# Patient Record
Sex: Female | Born: 1950
Health system: Southern US, Community
[De-identification: ages and names within clinical notes are randomized; demographics above are authoritative.]

## PROBLEM LIST (undated history)

## (undated) DIAGNOSIS — Z923 Personal history of irradiation: Secondary | ICD-10-CM

## (undated) DIAGNOSIS — K589 Irritable bowel syndrome without diarrhea: Secondary | ICD-10-CM

## (undated) DIAGNOSIS — K5790 Diverticulosis of intestine, part unspecified, without perforation or abscess without bleeding: Secondary | ICD-10-CM

## (undated) DIAGNOSIS — N809 Endometriosis, unspecified: Secondary | ICD-10-CM

## (undated) DIAGNOSIS — J449 Chronic obstructive pulmonary disease, unspecified: Secondary | ICD-10-CM

## (undated) DIAGNOSIS — K449 Diaphragmatic hernia without obstruction or gangrene: Secondary | ICD-10-CM

## (undated) DIAGNOSIS — R112 Nausea with vomiting, unspecified: Secondary | ICD-10-CM

## (undated) DIAGNOSIS — R519 Headache, unspecified: Secondary | ICD-10-CM

## (undated) DIAGNOSIS — K219 Gastro-esophageal reflux disease without esophagitis: Secondary | ICD-10-CM

## (undated) DIAGNOSIS — C801 Malignant (primary) neoplasm, unspecified: Secondary | ICD-10-CM

## (undated) DIAGNOSIS — T7840XA Allergy, unspecified, initial encounter: Secondary | ICD-10-CM

## (undated) DIAGNOSIS — K4090 Unilateral inguinal hernia, without obstruction or gangrene, not specified as recurrent: Secondary | ICD-10-CM

## (undated) DIAGNOSIS — M1990 Unspecified osteoarthritis, unspecified site: Secondary | ICD-10-CM

## (undated) DIAGNOSIS — H269 Unspecified cataract: Secondary | ICD-10-CM

## (undated) DIAGNOSIS — F419 Anxiety disorder, unspecified: Secondary | ICD-10-CM

## (undated) DIAGNOSIS — Z9889 Other specified postprocedural states: Secondary | ICD-10-CM

## (undated) DIAGNOSIS — F32A Depression, unspecified: Secondary | ICD-10-CM

## (undated) DIAGNOSIS — H409 Unspecified glaucoma: Secondary | ICD-10-CM

## (undated) DIAGNOSIS — D126 Benign neoplasm of colon, unspecified: Secondary | ICD-10-CM

## (undated) DIAGNOSIS — F329 Major depressive disorder, single episode, unspecified: Secondary | ICD-10-CM

## (undated) DIAGNOSIS — J939 Pneumothorax, unspecified: Secondary | ICD-10-CM

## (undated) HISTORY — PX: OTHER SURGICAL HISTORY: SHX169

## (undated) HISTORY — DX: Diaphragmatic hernia without obstruction or gangrene: K44.9

## (undated) HISTORY — DX: Irritable bowel syndrome, unspecified: K58.9

## (undated) HISTORY — DX: Endometriosis, unspecified: N80.9

## (undated) HISTORY — DX: Anxiety disorder, unspecified: F41.9

## (undated) HISTORY — DX: Unspecified glaucoma: H40.9

## (undated) HISTORY — DX: Allergy, unspecified, initial encounter: T78.40XA

## (undated) HISTORY — PX: BACK SURGERY: SHX140

## (undated) HISTORY — PX: PLEURAL SCARIFICATION: SHX748

## (undated) HISTORY — PX: TUBAL LIGATION: SHX77

## (undated) HISTORY — DX: Chronic obstructive pulmonary disease, unspecified: J44.9

## (undated) HISTORY — DX: Malignant (primary) neoplasm, unspecified: C80.1

## (undated) HISTORY — DX: Major depressive disorder, single episode, unspecified: F32.9

## (undated) HISTORY — DX: Diverticulosis of intestine, part unspecified, without perforation or abscess without bleeding: K57.90

## (undated) HISTORY — DX: Unilateral inguinal hernia, without obstruction or gangrene, not specified as recurrent: K40.90

## (undated) HISTORY — DX: Gastro-esophageal reflux disease without esophagitis: K21.9

## (undated) HISTORY — DX: Pneumothorax, unspecified: J93.9

## (undated) HISTORY — DX: Unspecified cataract: H26.9

## (undated) HISTORY — DX: Depression, unspecified: F32.A

## (undated) HISTORY — PX: CATARACT EXTRACTION: SUR2

## (undated) HISTORY — DX: Benign neoplasm of colon, unspecified: D12.6

---

## 1985-12-30 DIAGNOSIS — J939 Pneumothorax, unspecified: Secondary | ICD-10-CM

## 1985-12-30 HISTORY — DX: Pneumothorax, unspecified: J93.9

## 1990-12-30 HISTORY — PX: APPENDECTOMY: SHX54

## 1990-12-30 HISTORY — PX: ABDOMINAL HYSTERECTOMY: SHX81

## 1994-06-27 ENCOUNTER — Encounter: Payer: Self-pay | Admitting: Internal Medicine

## 1999-06-13 ENCOUNTER — Other Ambulatory Visit: Admission: RE | Admit: 1999-06-13 | Discharge: 1999-06-13 | Payer: Self-pay | Admitting: Obstetrics and Gynecology

## 2000-07-07 ENCOUNTER — Encounter: Payer: Self-pay | Admitting: Obstetrics and Gynecology

## 2000-07-07 ENCOUNTER — Encounter: Admission: RE | Admit: 2000-07-07 | Discharge: 2000-07-07 | Payer: Self-pay | Admitting: Obstetrics and Gynecology

## 2000-07-21 ENCOUNTER — Encounter: Admission: RE | Admit: 2000-07-21 | Discharge: 2000-07-21 | Payer: Self-pay | Admitting: Obstetrics and Gynecology

## 2000-07-21 ENCOUNTER — Encounter: Payer: Self-pay | Admitting: Obstetrics and Gynecology

## 2000-07-28 ENCOUNTER — Other Ambulatory Visit: Admission: RE | Admit: 2000-07-28 | Discharge: 2000-07-28 | Payer: Self-pay | Admitting: *Deleted

## 2000-11-18 ENCOUNTER — Encounter: Payer: Self-pay | Admitting: Neurosurgery

## 2000-11-19 ENCOUNTER — Encounter: Payer: Self-pay | Admitting: Neurosurgery

## 2000-11-19 ENCOUNTER — Inpatient Hospital Stay (HOSPITAL_COMMUNITY): Admission: RE | Admit: 2000-11-19 | Discharge: 2000-11-19 | Payer: Self-pay | Admitting: Neurosurgery

## 2000-12-05 ENCOUNTER — Encounter: Admission: RE | Admit: 2000-12-05 | Discharge: 2000-12-05 | Payer: Self-pay | Admitting: Neurosurgery

## 2000-12-05 ENCOUNTER — Encounter: Payer: Self-pay | Admitting: Neurosurgery

## 2000-12-28 ENCOUNTER — Encounter: Payer: Self-pay | Admitting: Neurosurgery

## 2000-12-28 ENCOUNTER — Ambulatory Visit (HOSPITAL_COMMUNITY): Admission: RE | Admit: 2000-12-28 | Discharge: 2000-12-28 | Payer: Self-pay | Admitting: Neurosurgery

## 2001-03-26 ENCOUNTER — Encounter: Admission: RE | Admit: 2001-03-26 | Discharge: 2001-03-26 | Payer: Self-pay | Admitting: Neurosurgery

## 2001-03-26 ENCOUNTER — Encounter: Payer: Self-pay | Admitting: Neurosurgery

## 2001-03-29 ENCOUNTER — Ambulatory Visit (HOSPITAL_COMMUNITY): Admission: RE | Admit: 2001-03-29 | Discharge: 2001-03-29 | Payer: Self-pay | Admitting: Neurosurgery

## 2001-03-29 ENCOUNTER — Encounter: Payer: Self-pay | Admitting: Neurosurgery

## 2001-09-16 ENCOUNTER — Other Ambulatory Visit: Admission: RE | Admit: 2001-09-16 | Discharge: 2001-09-16 | Payer: Self-pay | Admitting: Obstetrics and Gynecology

## 2001-10-12 ENCOUNTER — Encounter: Payer: Self-pay | Admitting: Obstetrics and Gynecology

## 2001-10-12 ENCOUNTER — Encounter: Admission: RE | Admit: 2001-10-12 | Discharge: 2001-10-12 | Payer: Self-pay | Admitting: Obstetrics and Gynecology

## 2001-11-25 ENCOUNTER — Encounter: Payer: Self-pay | Admitting: *Deleted

## 2001-11-25 ENCOUNTER — Encounter: Admission: RE | Admit: 2001-11-25 | Discharge: 2001-11-25 | Payer: Self-pay | Admitting: *Deleted

## 2002-01-05 ENCOUNTER — Encounter: Payer: Self-pay | Admitting: *Deleted

## 2002-01-05 ENCOUNTER — Encounter: Admission: RE | Admit: 2002-01-05 | Discharge: 2002-01-05 | Payer: Self-pay | Admitting: *Deleted

## 2002-03-25 ENCOUNTER — Ambulatory Visit (HOSPITAL_COMMUNITY): Admission: RE | Admit: 2002-03-25 | Discharge: 2002-03-25 | Payer: Self-pay | Admitting: *Deleted

## 2002-03-25 ENCOUNTER — Encounter: Payer: Self-pay | Admitting: *Deleted

## 2002-04-08 ENCOUNTER — Encounter: Admission: RE | Admit: 2002-04-08 | Discharge: 2002-04-08 | Payer: Self-pay | Admitting: *Deleted

## 2002-04-08 ENCOUNTER — Encounter: Payer: Self-pay | Admitting: *Deleted

## 2002-04-13 ENCOUNTER — Encounter: Payer: Self-pay | Admitting: *Deleted

## 2002-04-13 ENCOUNTER — Encounter: Admission: RE | Admit: 2002-04-13 | Discharge: 2002-04-13 | Payer: Self-pay | Admitting: *Deleted

## 2002-04-15 ENCOUNTER — Encounter: Payer: Self-pay | Admitting: *Deleted

## 2002-04-15 ENCOUNTER — Ambulatory Visit (HOSPITAL_COMMUNITY): Admission: RE | Admit: 2002-04-15 | Discharge: 2002-04-15 | Payer: Self-pay | Admitting: *Deleted

## 2002-05-20 ENCOUNTER — Encounter: Payer: Self-pay | Admitting: Internal Medicine

## 2002-10-27 ENCOUNTER — Emergency Department (HOSPITAL_COMMUNITY): Admission: EM | Admit: 2002-10-27 | Discharge: 2002-10-27 | Payer: Self-pay

## 2002-11-04 ENCOUNTER — Encounter: Payer: Self-pay | Admitting: Obstetrics and Gynecology

## 2002-11-04 ENCOUNTER — Encounter: Admission: RE | Admit: 2002-11-04 | Discharge: 2002-11-04 | Payer: Self-pay | Admitting: Obstetrics and Gynecology

## 2003-01-07 ENCOUNTER — Ambulatory Visit (HOSPITAL_COMMUNITY): Admission: RE | Admit: 2003-01-07 | Discharge: 2003-01-07 | Payer: Self-pay | Admitting: Internal Medicine

## 2003-01-07 ENCOUNTER — Encounter: Payer: Self-pay | Admitting: Internal Medicine

## 2003-07-07 ENCOUNTER — Encounter: Payer: Self-pay | Admitting: Internal Medicine

## 2003-07-07 ENCOUNTER — Ambulatory Visit (HOSPITAL_COMMUNITY): Admission: RE | Admit: 2003-07-07 | Discharge: 2003-07-07 | Payer: Self-pay | Admitting: Internal Medicine

## 2004-01-27 ENCOUNTER — Ambulatory Visit (HOSPITAL_COMMUNITY): Admission: RE | Admit: 2004-01-27 | Discharge: 2004-01-27 | Payer: Self-pay | Admitting: Family Medicine

## 2004-02-16 ENCOUNTER — Encounter: Admission: RE | Admit: 2004-02-16 | Discharge: 2004-02-16 | Payer: Self-pay | Admitting: Family Medicine

## 2005-08-20 ENCOUNTER — Other Ambulatory Visit: Admission: RE | Admit: 2005-08-20 | Discharge: 2005-08-20 | Payer: Self-pay | Admitting: Family Medicine

## 2005-08-22 ENCOUNTER — Encounter: Admission: RE | Admit: 2005-08-22 | Discharge: 2005-08-22 | Payer: Self-pay | Admitting: Family Medicine

## 2006-07-22 ENCOUNTER — Ambulatory Visit: Payer: Self-pay | Admitting: Internal Medicine

## 2006-07-29 ENCOUNTER — Encounter: Payer: Self-pay | Admitting: Internal Medicine

## 2006-07-29 ENCOUNTER — Ambulatory Visit: Payer: Self-pay | Admitting: Cardiology

## 2008-05-09 ENCOUNTER — Encounter: Admission: RE | Admit: 2008-05-09 | Discharge: 2008-05-09 | Payer: Self-pay | Admitting: Family Medicine

## 2008-08-23 ENCOUNTER — Encounter: Admission: RE | Admit: 2008-08-23 | Discharge: 2008-08-23 | Payer: Self-pay | Admitting: Family Medicine

## 2008-09-21 ENCOUNTER — Encounter (INDEPENDENT_AMBULATORY_CARE_PROVIDER_SITE_OTHER): Payer: Self-pay | Admitting: *Deleted

## 2008-09-21 ENCOUNTER — Encounter: Admission: RE | Admit: 2008-09-21 | Discharge: 2008-09-21 | Payer: Self-pay | Admitting: Family Medicine

## 2008-10-17 DIAGNOSIS — K449 Diaphragmatic hernia without obstruction or gangrene: Secondary | ICD-10-CM | POA: Insufficient documentation

## 2008-10-17 DIAGNOSIS — J939 Pneumothorax, unspecified: Secondary | ICD-10-CM | POA: Insufficient documentation

## 2008-10-17 DIAGNOSIS — R1031 Right lower quadrant pain: Secondary | ICD-10-CM | POA: Insufficient documentation

## 2008-10-17 DIAGNOSIS — R1032 Left lower quadrant pain: Secondary | ICD-10-CM

## 2008-10-17 DIAGNOSIS — Z860100 Personal history of colon polyps, unspecified: Secondary | ICD-10-CM | POA: Insufficient documentation

## 2008-10-17 DIAGNOSIS — K5730 Diverticulosis of large intestine without perforation or abscess without bleeding: Secondary | ICD-10-CM | POA: Insufficient documentation

## 2008-10-17 DIAGNOSIS — J930 Spontaneous tension pneumothorax: Secondary | ICD-10-CM

## 2008-10-17 DIAGNOSIS — N809 Endometriosis, unspecified: Secondary | ICD-10-CM | POA: Insufficient documentation

## 2008-10-17 DIAGNOSIS — Z8659 Personal history of other mental and behavioral disorders: Secondary | ICD-10-CM

## 2008-10-20 ENCOUNTER — Ambulatory Visit: Payer: Self-pay | Admitting: Internal Medicine

## 2009-03-29 ENCOUNTER — Encounter (INDEPENDENT_AMBULATORY_CARE_PROVIDER_SITE_OTHER): Payer: Self-pay | Admitting: *Deleted

## 2009-05-04 ENCOUNTER — Ambulatory Visit: Payer: Self-pay | Admitting: Internal Medicine

## 2009-05-10 ENCOUNTER — Encounter: Admission: RE | Admit: 2009-05-10 | Discharge: 2009-05-10 | Payer: Self-pay | Admitting: Family Medicine

## 2009-05-23 ENCOUNTER — Ambulatory Visit: Payer: Self-pay | Admitting: Internal Medicine

## 2010-05-14 ENCOUNTER — Encounter: Admission: RE | Admit: 2010-05-14 | Discharge: 2010-05-14 | Payer: Self-pay | Admitting: Family Medicine

## 2011-05-17 NOTE — Op Note (Signed)
[place]. [facility]  Patient:    [name] [name], [name] [name]                         MRN: [id number] Proc. Date: 11/19/00 Adm. Date:  [id number] Attending:  Emeterio Reeve                           Operative Report  POSTOPERATIVE DIAGNOSIS:  Herniated disk C6-7 on the right.  OPERATION PERFORMED:  Anterior cervical diskectomy and fusion at C6-C7 with a tether plate.  SURGEON:  Payton Doughty, M.D.  ANESTHESIA:  General endotracheal.  PREP:  Sterile Betadine prep and scrub with alcohol wipe.  COMPLICATIONS:  None.  INDICATIONS FOR PROCEDURE:  DESCRIPTION OF PROCEDURE:  The patient is a 60 year old right-handed white female with right C7 radiculopathy.  The patient was taken to the operating room, smoothly anesthetized and intubated, and placed supine on the operating table in halter head traction.  Following shave, prep and drape in the usual sterile fashion, the skin was incised in the midline medial border of sternocleidomastoid muscle on the left side at a point two fingerbreadths below the level of the carotid tubercle.  The platysma was identified, elevated, divided and undermined.  The sternocleidomastoid was identified. Medial dissection revealed the carotid arteryDD:  11/19/00 TD:  11/19/00 Job: [id number]

## 2011-05-17 NOTE — Op Note (Signed)
[place]. [facility]  Patient:    [name] [name], [name] [name]                         MRN: [id number] Proc. Date: 11/19/00 Adm. Date:  [id number] Attending:  Emeterio Reeve                           Operative Report  PREOPERATIVE DIAGNOSIS:  Herniated disk C6-7 on the right.  POSTOPERATIVE DIAGNOSIS:  Herniated disk C6-7 on the right.  PROCEDURE:  Anterior cervical diskectomy and fusion at C6-7 with a Tether plate.  SURGEON:  Payton Doughty, M.D.  ANESTHESIA:  General endotracheal.  PREP:  _____.  COMPLICATIONS:  None.  DESCRIPTION OF PROCEDURE:  This is a 60 year old right-handed white girl with right C7 radiculopathy.  She was taken to the operating room, smooth induction of anesthesia, was intubated, placed supine on the operating table in the halter head traction.  Following shave, prep, and drape in the usual sterile fashion, skin was incised in the midline in the medial border of the sternocleidomastoid muscle on the left side one to two finger breadths below the level of the carotid tubercle.  The platysma was identified, elevated, divided, and undermined.  The sternocleidomastoid was identified, and medial dissection revealed the carotid artery and jugular vein, which were retracted laterally to the left, trachea and esophagus were retracted laterally to the right, exposing the bone at the anterior cervical spine.  A marker was placed, intraoperative x-ray obtained.  The marker was at 5-6.  Diskectomy was carried out at the level below that.  The longus colli was taken down over a short span of the C6 and C7 vertebral bodies and the disk then removed, first under gross observation, then using the operating microscope.  A large herniated disk with a free fragment out over the right C7 nerve root was identified and removed without difficulty.  It had penetrated the posterior longitudinal ligament and the annulus.  The nerve root was carefully explored and was  found to be free once the disk had been removed.  The left-sided root was explored and found to be unencumbered.  A 7 mm bone graft was then fashioned from patellar allograft and tapped into place.  A 16 mm Tether plate was then placed with 12 mm screws.  Intraoperative x-ray showed good placement of bone graft, plate, and screws, and there was good purchase obtained with all the screws, and they locked to the plate well.  The wound was irrigated and hemostasis assured.  The platysma was reapproximated with 3-0 Vicryl in interrupted fashion, subcutaneous tissue was reapproximated with 3-0 Vicryl in interrupted fashion.  Skin was closed with 4-0 Vicryl in a running subcuticular fashion.  Benzoin and Steri-Strips were placed, made occlusive with Telfa and OpSite.  The patient then returned to the recovery room in good condition after being placed in an Aspen collar. DD:  11/19/00 TD:  11/19/00 Job: [id number]

## 2011-05-17 NOTE — H&P (Signed)
[place]. [facility]  Patient:    [name] [name], [name] [name]                         MRN: [id number] Adm. Date:  [id number] Attending:  Emeterio Reeve                         History and Physical  ADMITTING DIAGNOSIS:  Herniated disk C6-7 with a right C7 radiculopathy.  HISTORY OF PRESENT ILLNESS:  This is a 60 year old right-handed white lady who for the past few weeks has had marked increase of pain in the right shoulder and down the right arm. She is having difficulty using an ink pen. Her left arm is unaffected. She does not have much neck pain.  MEDICAL HISTORY:  Remarkable for a hysterectomy in May 1990.  ALLERGIES:  SULFA.  MEDICATIONS:  Synthroid 0.025 mg q.d., Premarin 0.9 mg q.d., Vicodin on a p.r.n. basis, Celebrex 200 mg q.d. She also has migraines for which she uses etodalac, Reglan, or ergotamine on a p.r.n. basis.  SOCIAL HISTORY:  She smokes a pack and a half of cigarettes a day. She does not drink alcohol. She works in a [employer] from 1 to 50 pounds.  FAMILY HISTORY:  A history of Crohns disease in the family and a history of osteoporosis and hypertension. Both parents are deceased.  REVIEW OF SYSTEMS:  Remarkable for wearing glasses, sinus problems, headaches, arm weakness, back pain, arm pain and neck pain. She has thyroid disease and hormone problems.  PHYSICAL EXAMINATION:  HEENT:  Within normal limits.  NECK:  Good range of motion and does not seem to reproduce her arm pain, except for in extension.  CHEST:  Clear.  CARDIAC:  Regular rate and rhythm.  ABDOMEN:  Nontender. No hepatosplenomegaly.  EXTREMITIES:  Without clubbing or cyanosis. Peripheral pulses are good.  GENITOURINARY:  Deferred.  NEUROLOGIC:  She is awake, alert and oriented. Cranial nerves are intact. Motor exam reveals 5/5 strength throughout the upper extremities save for the right triceps, which was 3+ at best. No current sensory deficit, although  she does lack dexterity in her fingers. Reflexes are absent in the right triceps and 2 throughout the rest of the upper extremities. Lower extremities are nonmyopathic.  LABORATORY DATA:  She comes accompanied with plain films that demonstrates spondylitic changes on an MRI that shows large ruptured disk at C6-7 eccentric to the right side with compression of both the right 7 root, as well the cord on the right side.  CLINICAL IMPRESSION:  Profound right C7 radiculopathy secondary to disk.  PLAN:  Anterior cervicectomy and fusion. Given the profound nature of the deficit I do not think waiting or conservative measures are warranted here. The risks and benefits of this approach have been discussed with her and she wishes to proceed. DD:  11/19/00 TD:  11/19/00 Job: [id number]

## 2011-05-17 NOTE — Assessment & Plan Note (Signed)
[name] [name]                           GASTROENTEROLOGY OFFICE NOTE   NAME:[name], [name] SIDDIQUE                       MRN:          [id number]  DATE:07/22/2006                            DOB:          12-Apr-1951    [name] [name] is a 60 year old, white female with chronic intermittent left  lower quadrant abdominal pain.  The current attack started about 1 month ago  with severe left lower quadrant abdominal pain x2 weeks which got somewhat  better after she went on a full liquid diet and bland food.  She denied any  fever or rectal bleeding.  We have been treating her for left lower quadrant  abdominal pain for many years and it has been somewhat an enigma.  Her  colonoscopies in 1993, 1994, 1997 and in April 2003, showed only edematous  polyp of the colon in 1993, but did not show any significant diverticulosis.  The pain has been attributed to either endometriosis, which she had in the  past, some adhesions or possibly due to irritable bowel syndrome.  She has  been on antispasmodic medications intermittently.  This time, the pain is  already somewhat better.  It was feeling rather raw, but her bowel habits  have improved from having frequent stools to being almost normal.   MEDICATIONS:  1.  Robinul 42 mg.  She has not taken this at all.  2.  Multivitamin.  3.  Calcium supplements.   PHYSICAL EXAMINATION:  VITAL SIGNS:  Blood pressure 102/62, pulse 80, weight  193 pounds which is a 50 pound weight gain since her last visit.  LUNGS:  Clear to auscultation  CARDIAC:  Normal S1, S2.  ABDOMEN:  Soft, relaxed with minimal discomfort in left lower and left  middle quadrant around the umbilical area.  Normal right upper and lower  quadrants.  No palpable mass or rebound.  Liver edge at costal margin.  Bowel sounds normoactive.  RECTAL:  Normal tone, no stool.  Mucus was hemoccult negative.   IMPRESSION:  A 60 year old female with recurrence of the left  lower quadrant  abdominal pain.  Previous four colonoscopies did not clearly define  diagnosis.  I assume we are dealing with irritable bowel syndrome  or  possibly due to some pelvic adhesions from previous endometriosis.  At this  time, the symptoms overall feeling is that she could have an ischemic  colitis or some nonspecific segmental colitis.  She did not take her  antispasmodic medications because she works outdoors and the antispasmodics  can cause anhidrosis, lack of sweating, which could cause vasomotor  problems.   PLAN:  1.  CT scan of the abdomen with attention to left lower quadrant.  If the      pain does not resolve, we may be interested in flexible sigmoidoscopy to      look at the left colon.  2.  Levbid 0.375 mg take it only at bedtime to avoid the daytime affects of      the anticholinergics.  3.  Cipro 500 mg p.o. b.i.d. x1 week.  4.  So far, if  everything gets better, she will have a relook colonoscopy in      May 2008, otherwise may do flexible sigmoidoscopy prior to that.                                   [name] [name]. [name] Chance, MD   DMB/MedQ  DD:  07/22/2006  DT:  07/22/2006  Job #:  [id number]   cc:   [name] Coventry, MD

## 2012-05-07 ENCOUNTER — Encounter (HOSPITAL_COMMUNITY): Payer: Self-pay

## 2012-05-11 ENCOUNTER — Encounter (HOSPITAL_COMMUNITY): Payer: Self-pay

## 2012-05-20 ENCOUNTER — Other Ambulatory Visit (HOSPITAL_COMMUNITY): Payer: Self-pay | Admitting: Family Medicine

## 2012-05-21 ENCOUNTER — Ambulatory Visit (HOSPITAL_COMMUNITY): Payer: PRIVATE HEALTH INSURANCE | Attending: Cardiology

## 2012-05-21 ENCOUNTER — Encounter (HOSPITAL_COMMUNITY): Payer: Self-pay | Admitting: Family Medicine

## 2012-05-21 ENCOUNTER — Encounter: Payer: Self-pay | Admitting: Family Medicine

## 2012-05-21 DIAGNOSIS — R5381 Other malaise: Secondary | ICD-10-CM | POA: Insufficient documentation

## 2012-05-21 DIAGNOSIS — Z8249 Family history of ischemic heart disease and other diseases of the circulatory system: Secondary | ICD-10-CM | POA: Insufficient documentation

## 2012-05-21 DIAGNOSIS — R5383 Other fatigue: Secondary | ICD-10-CM | POA: Insufficient documentation

## 2012-05-21 DIAGNOSIS — E785 Hyperlipidemia, unspecified: Secondary | ICD-10-CM | POA: Insufficient documentation

## 2012-05-21 DIAGNOSIS — R002 Palpitations: Secondary | ICD-10-CM | POA: Insufficient documentation

## 2012-05-21 DIAGNOSIS — R072 Precordial pain: Secondary | ICD-10-CM | POA: Insufficient documentation

## 2012-05-21 DIAGNOSIS — R42 Dizziness and giddiness: Secondary | ICD-10-CM | POA: Insufficient documentation

## 2012-05-22 ENCOUNTER — Encounter (HOSPITAL_COMMUNITY): Payer: Self-pay | Admitting: Family Medicine

## 2012-11-01 ENCOUNTER — Encounter: Payer: Self-pay | Admitting: Family Medicine

## 2012-11-01 ENCOUNTER — Ambulatory Visit (INDEPENDENT_AMBULATORY_CARE_PROVIDER_SITE_OTHER): Payer: PRIVATE HEALTH INSURANCE | Admitting: Family Medicine

## 2012-11-01 ENCOUNTER — Other Ambulatory Visit: Payer: Self-pay | Admitting: Family Medicine

## 2012-11-01 VITALS — BP 116/84 | HR 70 | Temp 98.5°F | Resp 16 | Ht 68.0 in | Wt 199.6 lb

## 2012-11-01 DIAGNOSIS — J40 Bronchitis, not specified as acute or chronic: Secondary | ICD-10-CM

## 2012-11-01 MED ORDER — MOMETASONE FURO-FORMOTEROL FUM 200-5 MCG/ACT IN AERO: 2.0000 | INHALATION_SPRAY | Freq: Two times a day (BID) | RESPIRATORY_TRACT | Status: DC

## 2012-11-01 MED ORDER — MOXIFLOXACIN HCL 400 MG PO TABS: 400.0000 mg | ORAL_TABLET | Freq: Every day | ORAL | Status: DC

## 2012-11-01 MED ORDER — HYDROCODONE-HOMATROPINE 5-1.5 MG/5ML PO SYRP: 5.0000 mL | ORAL_SOLUTION | Freq: Three times a day (TID) | ORAL | Status: DC | PRN

## 2012-11-01 NOTE — Progress Notes (Signed)
@[facility] @   Patient ID: LENOR PROVENCHER MRN: [id number], DOB: 1951/09/17, 61 y.o. Date of Encounter: 11/01/2012, 12:11 PM  Primary Physician: Tomma Lightning, MD  Chief Complaint:  Chief Complaint  Patient presents with  . Cough    x 1 week green sputum     HPI: 61 y.o. year old female presents with a 5 day history of nasal congestion, post nasal drip, sore throat, and cough. Mild sinus pressure. Afebrile. No chills. Nasal congestion thick and green/yellow. Cough is productive of green/yellow sputum and not associated with time of day. Ears feel full, leading to sensation of muffled hearing. Has tried OTC cold preps without success. No GI complaints.   No sick contacts, recent antibiotics, or recent travels.   No leg trauma, sedentary periods, h/o cancer, or tobacco use.  Past Medical History  Diagnosis Date  . Depression   . Allergy   . Glaucoma   . Anxiety   . COPD (chronic obstructive pulmonary disease)      Home Meds: Prior to Admission medications   Medication Sig Start Date End Date Taking? Authorizing Provider  venlafaxine (EFFEXOR) 37.5 MG tablet Take 37.5 mg by mouth daily.   Yes Historical Provider, MD  HYDROcodone-homatropine (HYCODAN) 5-1.5 MG/5ML syrup Take 5 mLs by mouth every 8 (eight) hours as needed for cough. 11/01/12   Elvina Sidle, MD  mometasone-formoterol [facility]) 200-5 MCG/ACT AERO Inhale 2 puffs into the lungs 2 (two) times daily. 11/01/12   Elvina Sidle, MD  moxifloxacin (AVELOX) 400 MG tablet Take 1 tablet (400 mg total) by mouth daily. 11/01/12   Elvina Sidle, MD    Allergies:  Allergies  Allergen Reactions  . Cefaclor   . Latex Other (See Comments)    blister  . Ofloxacin     History   Social History  . Marital Status: Married    Spouse Name: N/A    Number of Children: N/A  . Years of Education: N/A   Occupational History  . Not on file.   Social History Main Topics  . Smoking status: Former Smoker    Start date: 01/31/2012    . Smokeless tobacco: Not on file  . Alcohol Use: No  . Drug Use: No  . Sexually Active: No   Other Topics Concern  . Not on file   Social History Narrative  . No narrative on file     Review of Systems: Constitutional: negative for chills, fever, night sweats or weight changes Cardiovascular: negative for chest pain or palpitations Respiratory: negative for hemoptysis, wheezing, or shortness of breath Abdominal: negative for abdominal pain, nausea, vomiting or diarrhea Dermatological: negative for rash Neurologic: negative for headache   Physical Exam: Blood pressure 116/84, pulse 70, temperature 98.5 F (36.9 C), temperature source Oral, resp. rate 16, height 5\' 8"  (1.727 m), weight 199 lb 9.6 oz (90.538 kg), SpO2 96.00%., Body mass index is 30.35 kg/(m^2). General: Well developed, well nourished, in no acute distress. Head: Normocephalic, atraumatic, eyes without discharge, sclera non-icteric, nares are congested. Bilateral auditory canals clear, TM's are without perforation, pearly grey with reflective cone of light bilaterally. No sinus TTP. Oral cavity moist, dentition normal. Posterior pharynx with post nasal drip and mild erythema. No peritonsillar abscess or tonsillar exudate. Neck: Supple. No thyromegaly. Full ROM. No lymphadenopathy. Lungs: Coarse breath sounds bilaterally without wheezes, rales, or rhonchi. Breathing is unlabored.  Heart: RRR with S1 S2. No murmurs, rubs, or gallops appreciated. Msk:  Strength and tone normal for age. Extremities: No clubbing  or cyanosis. No edema. Neuro: Alert and oriented X 3. Moves all extremities spontaneously. CNII-XII grossly in tact. Psych:  Responds to questions appropriately with a normal affect.   Labs:   ASSESSMENT AND PLAN:  61 y.o. year old female with bronchitis. 1. Bronchitis  moxifloxacin (AVELOX) 400 MG tablet, mometasone-formoterol (DULERA) 200-5 MCG/ACT AERO, HYDROcodone-homatropine (HYCODAN) 5-1.5 MG/5ML syrup     - -Mucinex -Tylenol/Motrin prn -Rest/fluids -RTC precautions -RTC 3-5 days if no improvement  Signed, Elvina Sidle, MD 11/01/2012 12:11 PM

## 2012-11-02 ENCOUNTER — Other Ambulatory Visit: Payer: Self-pay | Admitting: Family Medicine

## 2012-11-02 MED ORDER — PREDNISONE 20 MG PO TABS: 40.0000 mg | ORAL_TABLET | Freq: Every day | ORAL | Status: DC

## 2012-11-02 NOTE — Telephone Encounter (Signed)
Dr. Milus Glazier,  This patient was under the impression that you were going to prescribe prednisone.  Was that your intention?  If so, please approve the request.  If not, please advise and route message back to p [facility].  Thanks.

## 2013-05-03 ENCOUNTER — Telehealth: Payer: Self-pay | Admitting: Internal Medicine

## 2013-05-03 NOTE — Telephone Encounter (Signed)
Patient calling to report for the last week and half, she has had stomach problems. C/O 10 bowel movements/day that are paste like without blood. Abdominal tenderness in lower abdomen. Reports lots of gas and back pain. Denies nausea, vomiting, recent antibiotics or recent travel. Scheduled with Mike Gip, PA on 05/04/13 at 10:30 AM.

## 2013-05-04 ENCOUNTER — Ambulatory Visit (INDEPENDENT_AMBULATORY_CARE_PROVIDER_SITE_OTHER): Payer: BC Managed Care – PPO | Admitting: Physician Assistant

## 2013-05-04 ENCOUNTER — Ambulatory Visit (INDEPENDENT_AMBULATORY_CARE_PROVIDER_SITE_OTHER)
Admission: RE | Admit: 2013-05-04 | Discharge: 2013-05-04 | Disposition: A | Discharge status: Home / Self Care | Payer: BC Managed Care – PPO | Source: Ambulatory Visit | Attending: Physician Assistant | Admitting: Physician Assistant

## 2013-05-04 ENCOUNTER — Encounter: Payer: Self-pay | Admitting: Physician Assistant

## 2013-05-04 VITALS — BP 110/80 | HR 72 | Ht 68.9 in | Wt 208.1 lb

## 2013-05-04 DIAGNOSIS — R140 Abdominal distension (gaseous): Secondary | ICD-10-CM

## 2013-05-04 DIAGNOSIS — R194 Change in bowel habit: Secondary | ICD-10-CM

## 2013-05-04 DIAGNOSIS — K589 Irritable bowel syndrome without diarrhea: Secondary | ICD-10-CM

## 2013-05-04 DIAGNOSIS — R143 Flatulence: Secondary | ICD-10-CM

## 2013-05-04 DIAGNOSIS — R198 Other specified symptoms and signs involving the digestive system and abdomen: Secondary | ICD-10-CM

## 2013-05-04 MED ORDER — ALIGN PO CAPS: 1.0000 | ORAL_CAPSULE | Freq: Every day | ORAL | Status: DC

## 2013-05-04 MED ORDER — RIFAXIMIN 550 MG PO TABS: 550.0000 mg | ORAL_TABLET | Freq: Two times a day (BID) | ORAL | Status: DC

## 2013-05-04 MED ORDER — METRONIDAZOLE 250 MG PO TABS: ORAL_TABLET | ORAL | Status: DC

## 2013-05-04 NOTE — Progress Notes (Signed)
Subjective:    Patient ID: [name] [name], female    DOB: Oct 28, 1951, 62 y.o.   MRN: [id number]  HPI [name] [name] is a pleasant 62 year old female known to Dr. Vincent Gros with history of IBS and adenomatous colon polyps. She also has history of depression glaucoma anxiety and COPD. She is status post partial hysterectomy and appendectomy. She comes in today stating that she's been having problems for the past 10-12 days with abdominal bloating distention gas and change in her bowel habits with very pasty urgent stools. She says she tends to get these episodes once or twice a year usually in the spring and the fall and that this episode is worse than what she normally experiences. She says she's having about 10 bowel movements per day but that her stool is not liquid but pasty in consistency. There is no melena or hematochezia. She denies any fever or chills. She's had no nausea or vomiting and her appetite is fairly normal. She says she gets occasional episodes of explosive stool. He is not had any recent changes in her meds and last course of antibiotics was 6 months ago. Last colonoscopy was done in 2010 with 1 diminutive sigmoid polyp noted and otherwise normal exam. Tissue was not retrieved    Review of Systems  Constitutional: Negative.   HENT: Negative.   Eyes: Negative.   Respiratory: Negative.   Cardiovascular: Negative.   Gastrointestinal: Positive for diarrhea and abdominal distention.  Endocrine: Negative.   Genitourinary: Negative.   Skin: Negative.   Allergic/Immunologic: Negative.   Neurological: Negative.   Psychiatric/Behavioral: Negative.    Allergies  Allergen Reactions  . Cefaclor   . Latex Other (See Comments)    blister  . Ofloxacin    Outpatient Prescriptions Prior to Visit  Medication Sig Dispense Refill  . HYDROcodone-homatropine (HYCODAN) 5-1.5 MG/5ML syrup Take 5 mLs by mouth every 8 (eight) hours as needed for cough.  120 mL  0  . mometasone-formoterol (DULERA)  200-5 MCG/ACT AERO Inhale 2 puffs into the lungs 2 (two) times daily.  1 Inhaler  3  . moxifloxacin (AVELOX) 400 MG tablet Take 1 tablet (400 mg total) by mouth daily.  7 tablet  0  . predniSONE (DELTASONE) 20 MG tablet TAKE 1 TABLET BY MOUTH TWICE A DAY FOR 5 DAYS  10 tablet  0  . predniSONE (DELTASONE) 20 MG tablet Take 2 tablets (40 mg total) by mouth daily.  10 tablet  0  . venlafaxine (EFFEXOR) 37.5 MG tablet Take 37.5 mg by mouth daily.       No facility-administered medications prior to visit.   Patient Active Problem List   Diagnosis Date Noted  . Pneumothorax 10/17/2008  . HIATAL HERNIA 10/17/2008  . DIVERTICULOSIS, COLON 10/17/2008  . ENDOMETRIOSIS 10/17/2008  . ABDOMINAL PAIN, LEFT LOWER QUADRANT 10/17/2008  . DEPRESSION, HX OF 10/17/2008  . COLONIC POLYPS, ADENOMATOUS, HX OF 10/17/2008  . PNEUMOTHORAX 10/17/2008   History  Substance Use Topics  . Smoking status: Former Smoker    Start date: 01/31/2012  . Smokeless tobacco: Never Used  . Alcohol Use: No   family history includes Cancer in her maternal grandmother; Crohn's disease in her sister; Heart failure in her mother; Lung disease in her sister; Neuropathy in her sister; Osteoporosis in her sister; and Thyroid disease in her sister.     Objective:   Physical Exam Well-developed white female in no acute distress, pleasant blood pressure 110/80 pulse 72 height 5 foot 8 weight 208. HEENT;  nontraumatic normocephalic EOMI PERRLA sclera anicteric,Neck; Supple no JVD, Cardiovascula;r regular rate and rhythm with S1-S2 no murmur rub or gallop, Pulmonary; clear bilaterally, Abdomen; mildly distended non-tympanitic bowel sounds are present is no focal tenderness she has generalized mild tenderness in the lower abdomen no guarding or rebound no palpable mass or hepatosplenomegaly, Rectal; exam not done, Extremities ;no clubbing cyanosis or edema skin warm and dry, Psych; mood and affect normal and appropriate.        Assessment & Plan:  #40 62 year old female with 10-12 day history of abdominal bloating distention gas and change in bowel habits with very frequent pasty stool. This is in the setting of history of IBS and similar though less severe episodes over the past few years appear He may have a component of small bowel bacterial overgrowth, doubt partial small bowel obstruction but will rule out  #2 history of adenomatous colon polyps last colonoscopy 2010-1 diminutive polyp noted #3 diverticulosis  Plan; Will give her an impaired trial of Flagyl 250 mg by mouth 4 times daily x10 days for potential bacterial overgrowth. Add align one by mouth daily Patient is advised to call in a week if her symptoms are not significantly improved. Low gas diet.

## 2013-05-04 NOTE — Progress Notes (Signed)
Reviewed and agree. She has a hx of persistent urgent diarrhea. 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

## 2013-05-04 NOTE — Patient Instructions (Addendum)
Go to our Radiology department in the basement.  We have given you samples of Align probiotic. Take Xifaxan sample 1 tab twice daily.  ( only enough for 1 day).  We sent the Flagyl to [facility] [address]. Take as directed.  We have given you a low gas diet to follow.  Call us in a week with a progress report. You can ask for Chrisanne Loose, [phone]. Choose option 2.

## 2013-05-14 ENCOUNTER — Telehealth: Payer: Self-pay | Admitting: Physician Assistant

## 2013-05-17 ENCOUNTER — Ambulatory Visit (INDEPENDENT_AMBULATORY_CARE_PROVIDER_SITE_OTHER): Payer: BC Managed Care – PPO | Admitting: Nurse Practitioner

## 2013-05-17 ENCOUNTER — Encounter: Payer: Self-pay | Admitting: Nurse Practitioner

## 2013-05-17 VITALS — BP 120/70 | HR 80 | Ht 69.0 in | Wt 208.0 lb

## 2013-05-17 DIAGNOSIS — R140 Abdominal distension (gaseous): Secondary | ICD-10-CM | POA: Insufficient documentation

## 2013-05-17 DIAGNOSIS — K5900 Constipation, unspecified: Secondary | ICD-10-CM

## 2013-05-17 DIAGNOSIS — R141 Gas pain: Secondary | ICD-10-CM

## 2013-05-17 MED ORDER — LINACLOTIDE 145 MCG PO CAPS: 145.0000 ug | ORAL_CAPSULE | Freq: Every day | ORAL | Status: DC

## 2013-05-17 NOTE — Progress Notes (Signed)
History of Present Illness:  Patient is a 62 year old female known to Dr. Juanda Chance. She has a history of IBS and adenomatous colon polyps. Patient was seen in the office 12 days ago for abdominal bloating, gas and bowel changes. Symptoms felt to be related to irritable bowel syndrome, possibly a component of small bowel bacterial overgrowth as well. Patient was given a trial of Flagyl and Align. She was given a low gas diet. Abdominal films were ordered and revealed mildly prominent stool within the right colon.    We called patient for a condition update a couple of days ago. She had some additional GI complaints, appointment made for today. Patient complains of reflux, a bulge under her left breast and persistent bloating and with frequent bowel movements. Flagyl nor Align helped. She is having 7-8 pasty stools in the am and another 2-3 in the afternoon. She is having difficulty controlling fecal incontinence if she walks or does a lot or bending. Patient gets similar symptoms every spring and fall but this time everything is worse and lasting much longer than usual.  No blood in stools. No abdominal pain but rather "sore". No fevers. No nausea or vomiting. Prior to a month ago stools were of normal consistency 1-2 times a day. Patient a little frustrated at lack of improvement. She hasn't started any new medications to cause bowel changes.   Patient gives a history of occasional GERD symptoms which have been more pronounced over last few weeks. She gets heartburn and regurgitation at night and when bending. Taking daily Zantac which helps to some degree.    Current Medications, Allergies, Past Medical History, Past Surgical History, Family History and Social History were reviewed in [employer] record.  Dg Abd 2 Views  05/04/2013   *RADIOLOGY REPORT*  Clinical Data: Abdominal distension with loose stools for 10 days.  ABDOMEN - 2 VIEW  Comparison: CT 09/21/2008.  Findings: The  abdomen is nearly gasless.  No significantly distended small or large bowel loops are demonstrated. There is mildly prominent stool within the right colon.  There is no free intraperitoneal air.  Pelvic calcifications typical of phleboliths are similar to the prior study.  IMPRESSION: No acute abdominal findings.   Original Report Authenticated By: Carey Bullocks, M.D.   Physical Exam: General: Well developed , white female in no acute distress Head: Normocephalic and atraumatic Eyes:  sclerae anicteric, conjunctiva pink  Ears: Normal auditory acuity Lungs: Clear throughout to auscultation Heart: Regular rate and rhythm Abdomen: Soft, non distended, non-tender. No masses, slight bulge in LUQ, cannot appreciate definite hernia. Normal bowel sounds. Liver edge 3-4 fingers below right subcostal margin.  Musculoskeletal: Symmetrical with no gross deformities  Extremities: No edema  Neurological: Alert oriented x 4, grossly nonfocal Psychological:  Alert and cooperative. Normal mood and affect  Assessment and Recommendations: 1. Persistent bloating / bowel changes with small volume "pasty" stools. Symptoms refractory to Align and course of Flagyl. Abdominal films show prominent stool in right colon. Though stools not liquid she may be having some degree of overflow, especially given incontinence. It is worthwhile to treat her for constipation. Symptoms could also just be secondary to IBS. Samples of Linzess given. Once bowels evacuated will see how she feels. If symptoms persist then will continue with further workup. She may need ultrasound of the abdomen to rule out ascites (though on exam she doesn't appear to have ascites).    2. GERD. Patient started daily Zantac. Patient prefers not to start  a PPI Recommended she increase Zantac to 150mg  BID. Discussed anti-reflux measures. If symptoms persists will revisit initiation of PPI with her.

## 2013-05-17 NOTE — Patient Instructions (Addendum)
Increase the Zantac to twice daily.   We have given you a sample of Linzess 145 mcg, take 1 capsule daily. Call us once you have finished the course of Linzess and let us know how you are doing. You can call  [phone], option 2  and ask for Zeddie Njie.  We want to know If the Linzess helped you move your bowels.

## 2013-05-17 NOTE — Telephone Encounter (Signed)
Called patient back and she complained of reflux and I offered samples of Nexium. I asked if she could come to pick them up before 5 .  She said she really thinks she needs seen again. I made her an appointment with Willette Cluster ACNP for Mon 05-17-2013 at 10 AM.  She also complained of a bulge under her left breast.  I asked if it was painful when compressed and she said no.

## 2013-06-21 ENCOUNTER — Encounter: Payer: Self-pay | Admitting: *Deleted

## 2013-07-06 ENCOUNTER — Other Ambulatory Visit (INDEPENDENT_AMBULATORY_CARE_PROVIDER_SITE_OTHER): Payer: BC Managed Care – PPO

## 2013-07-06 ENCOUNTER — Encounter: Payer: Self-pay | Admitting: Internal Medicine

## 2013-07-06 ENCOUNTER — Ambulatory Visit (INDEPENDENT_AMBULATORY_CARE_PROVIDER_SITE_OTHER): Payer: BC Managed Care – PPO | Admitting: Internal Medicine

## 2013-07-06 VITALS — BP 120/80 | HR 80 | Ht 69.0 in | Wt 213.8 lb

## 2013-07-06 DIAGNOSIS — R198 Other specified symptoms and signs involving the digestive system and abdomen: Secondary | ICD-10-CM

## 2013-07-06 DIAGNOSIS — R1011 Right upper quadrant pain: Secondary | ICD-10-CM

## 2013-07-06 LAB — COMPREHENSIVE METABOLIC PANEL
ALT: 16 U/L (ref 0–35)
Alkaline Phosphatase: 81 U/L (ref 39–117)
Glucose, Bld: 105 mg/dL — ABNORMAL HIGH (ref 70–99)
Sodium: 141 mEq/L (ref 135–145)
Total Bilirubin: 0.4 mg/dL (ref 0.3–1.2)
Total Protein: 7.6 g/dL (ref 6.0–8.3)

## 2013-07-06 MED ORDER — DICYCLOMINE HCL 20 MG PO TABS: 20.0000 mg | ORAL_TABLET | ORAL | Status: DC

## 2013-07-06 NOTE — Progress Notes (Signed)
[name] [name] 1951/12/13 MRN [id number]  History of Present Illness:  This is a 62 year old, white female with irritable bowel syndrome and right upper quadrant abdominal discomfort. Her last office visit was in May 2004 with Midge Minium. She initially responded to Align and Flagyl but the symptoms have now come back. She has small, pasty bowel movements 6-8 times a day but not at night. She denies rectal bleeding. She is up-to-date on her colonoscopy which was last done in May 2010. She has had multiple prior colonoscopies which showed hyperplastic polyps and adenomatous polyps starting in 1993. Her last CT scan of the abdomen in September 2009 showed a small left inguinal hernia. She used to have a goiter and was on thyroid replacement. Over the past several years, she has continued to gain weight, up to 5 pounds since her last appointment. She is status post total abdominal hysterectomy and right salpingo-oophorectomy for endometriosis.   Past Medical History  Diagnosis Date  . Depression   . Allergy   . Glaucoma   . Anxiety   . COPD (chronic obstructive pulmonary disease)   . Adenomatous colon polyp   . Inguinal hernia   . Pneumothorax   . Diverticulosis   . Hiatal hernia   . Endometriosis   . IBS (irritable bowel syndrome)   . GERD (gastroesophageal reflux disease)    Past Surgical History  Procedure Laterality Date  . Ruptured disk    . Torn tendon    . Pleural scarification    . Abdominal hysterectomy    . Tubal ligation      reports that she has quit smoking. She started smoking about 17 months ago. She has never used smokeless tobacco. She reports that she does not drink alcohol or use illicit drugs. family history includes Colon polyps in her sister; Crohn's disease in her sister; Heart failure in her mother; Lung disease in her sister; Neuropathy in her sister; Osteoporosis in her sister; Thyroid cancer in her maternal grandmother; and Thyroid disease in her sister.   There is no history of Colon cancer. Allergies  Allergen Reactions  . Cefaclor   . Latex Other (See Comments)    blister  . Ofloxacin   . Sulfur     Thrush        Review of Systems:Positive for heartburn for which she takes ranitidine. Denies shortness of breath dysphagia  The remainder of the 10 point ROS is negative except as outlined in H&P   Physical Exam: General appearance  Well developed, in no distress. Eyes- non icteric. HEENT nontraumatic, normocephalic. Mouth no lesions, tongue papillated, no cheilosis. Neck supple without adenopathy, thyroid not enlarged, no carotid bruits, no JVD. Lungs Clear to auscultation bilaterally. Cor normal S1, normal S2, regular rhythm, no murmur,  quiet precordium. Abdomen: Protuberant obese but soft. Somewhat tense. No fluid wave. Minimal tenderness in right upper quadrant and normal left and right lower quadrants. Quiet bowel sounds. Liver edge not palpable.  Rectal:Soft Hemoccult negative stool in small amount.  Extremities no pedal edema. Skin no lesions. Neurological alert and oriented x 3. Psychological normal mood and affect.  Assessment and Plan:  Problem #40 62 year old white female with irritable bowel syndrome and persistent right upper quadrant discomfort which could be related to irritable bowel syndrome but because of the increasing weight and distention, we will proceed with a CT scan of the abdomen and pelvis to rule out metastatic disease. Her symptoms of frequent small stools are consistent with irritable bowel syndrome.  We will start her on Bentyl 20 mg every morning. She could not tolerate Linzess as it caused her to have rectal burning. We will also check her TSH level today. A recall colonoscopy will be due in May 2017.   07/06/2013 Lina Sar

## 2013-07-06 NOTE — Patient Instructions (Addendum)
We have sent the following medications to your pharmacy for you to pick up at your convenience: Bentyl  Your physician has requested that you go to the basement for the following lab work before leaving today: CMET, TSH, Sed Rate  You have been scheduled for a CT scan of the abdomen and pelvis at [facility] ([address]---this is in the same building as [facility]).   You are scheduled on 07/07/13 at 1:30 pm. You should arrive 15 minutes prior to your appointment time for registration. Please follow the written instructions below on the day of your exam:  WARNING: IF YOU ARE ALLERGIC TO IODINE/X-RAY DYE, PLEASE NOTIFY RADIOLOGY IMMEDIATELY AT [phone]! YOU WILL BE GIVEN A 13 HOUR PREMEDICATION PREP.  1) Do not eat or drink anything after 9:30 am (4 hours prior to your test) 2) You have been given 2 bottles of oral contrast to drink. The solution may taste better if refrigerated, but do NOT add ice or any other liquid to this solution. Shake well before drinking.    Drink 1 bottle of contrast @ 11:30 am (2 hours prior to your exam)  Drink 1 bottle of contrast @ 12:30 pm (1 hour prior to your exam)  You may take any medications as prescribed with a small amount of water except for the following: Metformin, Glucophage, Glucovance, Avandamet, Riomet, Fortamet, Actoplus Met, Janumet, Glumetza or Metaglip. The above medications must be held the day of the exam AND 48 hours after the exam.  The purpose of you drinking the oral contrast is to aid in the visualization of your intestinal tract. The contrast solution may cause some diarrhea. Before your exam is started, you will be given a small amount of fluid to drink. Depending on your individual set of symptoms, you may also receive an intravenous injection of x-ray contrast/dye. Plan on being at [facility] for 30 minutes or long, depending on the type of exam you are having performed.  This test typically takes  30-45 minutes to complete.  If you have any questions regarding your exam or if you need to reschedule, you may call the CT department at [phone] between the hours of 8:00 am and 5:00 pm, Monday-Friday.  ________________________________________________________________________ CC: Dr Nadyne Coombes

## 2013-07-07 ENCOUNTER — Ambulatory Visit (INDEPENDENT_AMBULATORY_CARE_PROVIDER_SITE_OTHER)
Admission: RE | Admit: 2013-07-07 | Discharge: 2013-07-07 | Disposition: A | Discharge status: Home / Self Care | Payer: BC Managed Care – PPO | Source: Ambulatory Visit | Attending: Internal Medicine | Admitting: Internal Medicine

## 2013-07-07 DIAGNOSIS — R198 Other specified symptoms and signs involving the digestive system and abdomen: Secondary | ICD-10-CM

## 2013-07-07 DIAGNOSIS — R1011 Right upper quadrant pain: Secondary | ICD-10-CM

## 2013-07-07 MED ORDER — IOHEXOL 300 MG/ML  SOLN
100.0000 mL | Freq: Once | INTRAMUSCULAR | Status: AC | PRN
Start: 2013-07-07 — End: 2013-07-07
  Administered 2013-07-07: 100 mL via INTRAVENOUS

## 2014-02-28 ENCOUNTER — Other Ambulatory Visit: Payer: Self-pay

## 2014-02-28 DIAGNOSIS — Z1231 Encounter for screening mammogram for malignant neoplasm of breast: Secondary | ICD-10-CM

## 2014-03-07 ENCOUNTER — Inpatient Hospital Stay: Admission: RE | Admit: 2014-03-07 | Payer: BC Managed Care – PPO | Source: Ambulatory Visit

## 2014-03-22 ENCOUNTER — Ambulatory Visit
Admission: RE | Admit: 2014-03-22 | Discharge: 2014-03-22 | Disposition: A | Discharge status: Home / Self Care | Payer: BC Managed Care – PPO | Source: Ambulatory Visit

## 2014-03-22 DIAGNOSIS — Z1231 Encounter for screening mammogram for malignant neoplasm of breast: Secondary | ICD-10-CM

## 2014-06-27 ENCOUNTER — Encounter (INDEPENDENT_AMBULATORY_CARE_PROVIDER_SITE_OTHER): Payer: Self-pay | Admitting: Surgery

## 2014-06-27 ENCOUNTER — Ambulatory Visit (INDEPENDENT_AMBULATORY_CARE_PROVIDER_SITE_OTHER): Payer: BC Managed Care – PPO | Admitting: Surgery

## 2014-06-27 VITALS — BP 118/80 | HR 77 | Temp 97.3°F | Resp 14 | Ht 69.5 in | Wt 204.6 lb

## 2014-06-27 DIAGNOSIS — K4090 Unilateral inguinal hernia, without obstruction or gangrene, not specified as recurrent: Secondary | ICD-10-CM

## 2014-06-27 NOTE — Patient Instructions (Signed)

## 2014-06-27 NOTE — Progress Notes (Signed)
Patient ID: [name], female   DOB: 05-30-1951, 63 y.o.   MRN: [id number]  Chief Complaint  Patient presents with  . Incisional Hernia    HPI [name] [name] is a 63 y.o. female.   HPI Patient sent at the request of Hayden Rasmussen, MD  For left groin pain for 2 months. Pain made worse with exercise and standing. The patient left groin. Mild to moderate intensity. Made worse with exercise. Intensity 5/10.Dull ache.  Past Medical History  Diagnosis Date  . Depression   . Allergy   . Glaucoma   . Anxiety   . COPD (chronic obstructive pulmonary disease)   . Adenomatous colon polyp   . Inguinal hernia   . Pneumothorax   . Diverticulosis   . Hiatal hernia   . Endometriosis   . IBS (irritable bowel syndrome)   . GERD (gastroesophageal reflux disease)   . Cancer     skin    Past Surgical History  Procedure Laterality Date  . Ruptured disk    . Torn tendon    . Pleural scarification    . Abdominal hysterectomy    . Tubal ligation      Family History  Problem Relation Age of Onset  . Thyroid cancer Maternal Grandmother   . Osteoporosis Sister   . Crohn's disease Sister   . Neuropathy Sister   . Thyroid disease Sister   . Lung disease Sister   . Heart failure Mother   . Colon cancer Neg Hx   . Colon polyps Sister     Social History History  Substance Use Topics  . Smoking status: Former Smoker    Types: Cigarettes    Start date: 01/31/2012  . Smokeless tobacco: Never Used  . Alcohol Use: No    Allergies  Allergen Reactions  . Cefaclor   . Latex Other (See Comments)    blister  . Ofloxacin   . Sulfur     Thrush    Current Outpatient Prescriptions  Medication Sig Dispense Refill  . ALPRAZolam (XANAX) 0.25 MG tablet       . ibuprofen (ADVIL) 200 MG tablet Take 200 mg by mouth every 6 (six) hours as needed for pain.      . promethazine (PHENERGAN) 25 MG tablet Take 25 mg by mouth every 6 (six) hours as needed for nausea or vomiting.      . ranitidine  (ZANTAC) 150 MG capsule Take 150 mg by mouth as needed for heartburn.      . sertraline (ZOLOFT) 25 MG tablet       . Vitamin D, Ergocalciferol, (DRISDOL) 50000 UNITS CAPS capsule        No current facility-administered medications for this visit.    Review of Systems Review of Systems  Constitutional: Negative for fever, chills and unexpected weight change.  HENT: Negative for congestion, hearing loss, sore throat, trouble swallowing and voice change.   Eyes: Negative for visual disturbance.  Respiratory: Negative for cough and wheezing.   Cardiovascular: Negative for chest pain, palpitations and leg swelling.  Gastrointestinal: Negative for nausea, vomiting, abdominal pain, diarrhea, constipation, blood in stool, abdominal distention and anal bleeding.  Genitourinary: Negative for hematuria, vaginal bleeding and difficulty urinating.  Musculoskeletal: Negative for arthralgias.  Skin: Negative for rash and wound.  Neurological: Negative for seizures, syncope and headaches.  Hematological: Negative for adenopathy. Does not bruise/bleed easily.  Psychiatric/Behavioral: Negative for confusion.    Blood pressure 118/80, pulse 77, temperature 97.3 F (  36.3 C), resp. rate 14, height 5' 9.5" (1.765 m), weight 204 lb 9.6 oz (92.806 kg).  Physical Exam Physical Exam  Constitutional: She is oriented to person, place, and time. She appears well-developed and well-nourished.  HENT:  Head: Normocephalic.  Mouth/Throat: No oropharyngeal exudate.  Eyes: Pupils are equal, round, and reactive to light. No scleral icterus.  Neck: Normal range of motion. Neck supple.  Cardiovascular: Normal rate and regular rhythm.   Pulmonary/Chest: Effort normal and breath sounds normal.  Abdominal: There is no tenderness. A hernia is present. Hernia confirmed positive in the left inguinal area. Hernia confirmed negative in the right inguinal area.  Musculoskeletal: Normal range of motion.  Neurological: She is  alert and oriented to person, place, and time.  Skin: Skin is warm and dry.  Psychiatric: She has a normal mood and affect. Her behavior is normal. Judgment and thought content normal.    Data Reviewed CT scan abdomen 2104   Assessment    Left inguinal hernia reducible    Plan    Recommend repair left hernia with mesh.The risk of hernia repair include bleeding,  Infection,   Recurrence of the hernia,  Mesh use, chronic pain,  Organ injury,  Bowel injury,  Bladder injury,   nerve injury with numbness around the incision,  Death,  and worsening of preexisting  medical problems.  The alternatives to surgery have been discussed as well..  Long term expectations of both operative and non operative treatments have been discussed.   The patient agrees to proceed.       CORNETT,THOMAS A. 06/27/2014, 12:37 PM

## 2014-09-27 ENCOUNTER — Ambulatory Visit: Payer: BC Managed Care – PPO | Admitting: Internal Medicine

## 2014-10-06 ENCOUNTER — Ambulatory Visit: Payer: BC Managed Care – PPO | Admitting: Internal Medicine

## 2014-11-10 ENCOUNTER — Other Ambulatory Visit (HOSPITAL_COMMUNITY): Payer: Self-pay | Admitting: Endocrinology

## 2014-11-10 DIAGNOSIS — E049 Nontoxic goiter, unspecified: Secondary | ICD-10-CM

## 2014-11-17 ENCOUNTER — Ambulatory Visit (HOSPITAL_COMMUNITY)
Admission: RE | Admit: 2014-11-17 | Discharge: 2014-11-17 | Disposition: A | Discharge status: Home / Self Care | Payer: BC Managed Care – PPO | Source: Ambulatory Visit | Attending: Endocrinology | Admitting: Endocrinology

## 2014-11-17 DIAGNOSIS — E049 Nontoxic goiter, unspecified: Secondary | ICD-10-CM

## 2014-11-17 DIAGNOSIS — E0590 Thyrotoxicosis, unspecified without thyrotoxic crisis or storm: Secondary | ICD-10-CM | POA: Insufficient documentation

## 2014-11-17 MED ORDER — SODIUM IODIDE I 131 CAPSULE
10.5500 | Freq: Once | INTRAVENOUS | Status: AC | PRN
  Administered 2014-11-17: 10.55 via ORAL

## 2014-11-18 ENCOUNTER — Ambulatory Visit (HOSPITAL_COMMUNITY)
Admission: RE | Admit: 2014-11-18 | Discharge: 2014-11-18 | Disposition: A | Discharge status: Home / Self Care | Payer: BC Managed Care – PPO | Source: Ambulatory Visit | Attending: Endocrinology | Admitting: Endocrinology

## 2014-11-18 DIAGNOSIS — E0590 Thyrotoxicosis, unspecified without thyrotoxic crisis or storm: Secondary | ICD-10-CM | POA: Diagnosis present

## 2014-11-18 MED ORDER — SODIUM PERTECHNETATE TC 99M INJECTION
10.0000 | Freq: Once | INTRAVENOUS | Status: AC | PRN
  Administered 2014-11-18: 10 via INTRAVENOUS

## 2014-12-26 ENCOUNTER — Ambulatory Visit (INDEPENDENT_AMBULATORY_CARE_PROVIDER_SITE_OTHER): Payer: BC Managed Care – PPO | Admitting: Family Medicine

## 2014-12-26 VITALS — BP 122/80 | HR 70 | Temp 97.8°F | Resp 18 | Ht 70.0 in | Wt 201.0 lb

## 2014-12-26 DIAGNOSIS — Z23 Encounter for immunization: Secondary | ICD-10-CM

## 2014-12-26 DIAGNOSIS — W540XXA Bitten by dog, initial encounter: Secondary | ICD-10-CM

## 2014-12-26 DIAGNOSIS — T148 Other injury of unspecified body region: Secondary | ICD-10-CM

## 2014-12-26 MED ORDER — AMOXICILLIN-POT CLAVULANATE 875-125 MG PO TABS: 1.0000 | ORAL_TABLET | Freq: Two times a day (BID) | ORAL | Status: DC

## 2014-12-26 NOTE — Progress Notes (Signed)
Chief Complaint:  Chief Complaint  Patient presents with  . Animal Bite    dog bite today rt leg    HPI: [name] [name] is a 63 y.o. female who is here for right lower leg dog bite this afternoon abut 3 hrs ago, it was a neighbors dog and dog is not utd on vaccines.  Animal control has gotten dog and will keepdog for 10 days for monitoring No fevers or chills or dc or swelling , she has tried cleaning it with hydrogen peroxide She has IBS and diverticular d Has taken amoxacallin in the past without problems  Past Medical History  Diagnosis Date  . Depression   . Allergy   . Glaucoma   . Anxiety   . COPD (chronic obstructive pulmonary disease)   . Adenomatous colon polyp   . Inguinal hernia   . Pneumothorax   . Diverticulosis   . Hiatal hernia   . Endometriosis   . IBS (irritable bowel syndrome)   . GERD (gastroesophageal reflux disease)   . Cancer     skin   Past Surgical History  Procedure Laterality Date  . Ruptured disk    . Torn tendon    . Pleural scarification    . Abdominal hysterectomy    . Tubal ligation     History   Social History  . Marital Status: Married    Spouse Name: N/A    Number of Children: 2  . Years of Education: N/A   Occupational History  . locksmith    Social History Main Topics  . Smoking status: Former Smoker    Types: Cigarettes    Start date: 01/31/2012  . Smokeless tobacco: Never Used  . Alcohol Use: No  . Drug Use: No  . Sexual Activity: No   Other Topics Concern  . None   Social History Narrative   Family History  Problem Relation Age of Onset  . Thyroid cancer Maternal Grandmother   . Osteoporosis Sister   . Crohn's disease Sister   . Neuropathy Sister   . Thyroid disease Sister   . Lung disease Sister   . Heart failure Mother   . Colon cancer Neg Hx   . Colon polyps Sister    Allergies  Allergen Reactions  . Cefaclor   . Latex Other (See Comments)    blister  . Ofloxacin   . Sulfur    Thrush   Prior to Admission medications   Medication Sig Start Date End Date Taking? Authorizing Provider  ALPRAZolam Duanne Moron) 0.25 MG tablet  06/06/14  Yes Historical Provider, MD  ibuprofen (ADVIL) 200 MG tablet Take 200 mg by mouth every 6 (six) hours as needed for pain.   Yes Historical Provider, MD  promethazine (PHENERGAN) 25 MG tablet Take 25 mg by mouth every 6 (six) hours as needed for nausea or vomiting.   Yes Historical Provider, MD  ranitidine (ZANTAC) 150 MG capsule Take 150 mg by mouth as needed for heartburn.   Yes Historical Provider, MD  sertraline (ZOLOFT) 25 MG tablet  06/15/14  Yes Historical Provider, MD  Vitamin D, Ergocalciferol, (DRISDOL) 50000 UNITS CAPS capsule  06/06/14  Yes Historical Provider, MD     ROS: The patient denies fevers, chills, night sweats, unintentional weight loss, chest pain, palpitations, wheezing, dyspnea on exertion, nausea, vomiting, abdominal pain, dysuria, hematuria, melena, numbness, weakness, or tingling.   All other systems have been reviewed and were otherwise negative with the exception  of those mentioned in the HPI and as above.    PHYSICAL EXAM: Filed Vitals:   12/26/14 1524  BP: 122/80  Pulse: 70  Temp: 97.8 F (36.6 C)  Resp: 18   Filed Vitals:   12/26/14 1524  Height: '5\' 10"'  (1.778 m)  Weight: 201 lb (91.173 kg)   Body mass index is 28.84 kg/(m^2).  General: Alert, no acute distress HEENT:  Normocephalic, atraumatic, oropharynx patent. EOMI, PERRLA Cardiovascular:  Regular rate and rhythm, no rubs murmurs or gallops.  Radial pulse intact. No pedal edema.  Respiratory: Clear to auscultation bilaterally.  No wheezes, rales, or rhonchi.  No cyanosis, no use of accessory musculature GI: No organomegaly, abdomen is soft and non-tender, positive bowel sounds.  No masses. Skin: + 2 small puncture wounds that are visible, nontender, no erythema  Neurologic: Facial musculature symmetric. Psychiatric: Patient is appropriate  throughout our interaction. Musculoskeletal: Gait intact.   LABS: Results for orders placed or performed in visit on 07/06/13  Comp Met (CMET)  Result Value Ref Range   Sodium 141 135 - 145 mEq/L   Potassium 4.6 3.5 - 5.1 mEq/L   Chloride 109 96 - 112 mEq/L   CO2 26 19 - 32 mEq/L   Glucose, Bld 105 (H) 70 - 99 mg/dL   BUN 16 6 - 23 mg/dL   Creatinine, Ser 1.0 0.4 - 1.2 mg/dL   Total Bilirubin 0.4 0.3 - 1.2 mg/dL   Alkaline Phosphatase 81 39 - 117 U/L   AST 17 0 - 37 U/L   ALT 16 0 - 35 U/L   Total Protein 7.6 6.0 - 8.3 g/dL   Albumin 4.0 3.5 - 5.2 g/dL   Calcium 9.4 8.4 - 10.5 mg/dL   GFR 61.87 >60.00 mL/min  TSH  Result Value Ref Range   TSH 3.74 0.35 - 5.50 uIU/mL  Sedimentation rate  Result Value Ref Range   Sed Rate 26 (H) 0 - 22 mm/hr     EKG/XRAY:   Primary read interpreted by Dr. Marin Comment at [facility].   ASSESSMENT/PLAN: Encounter Diagnoses  Name Primary?  . Dog bite Yes  . Need for prophylactic vaccination with tetanus-diphtheria (TD)    She is not utd on tetanus so will one today Rx augmentin in the event she ahs worsening redness, erythema, pain-take it then otherwise do not take it, her stomach is sensitive to meds Clean with soap and water daily F/u with animal control about dog  F/u prn   Gross sideeffects, risk and benefits, and alternatives of medications d/w patient. Patient is aware that all medications have potential sideeffects and we are unable to predict every sideeffect or drug-drug interaction that may occur.  Thaer Miyoshi, Newton Hamilton, DO 12/26/2014 4:12 PM

## 2014-12-26 NOTE — Patient Instructions (Signed)

## 2015-02-27 ENCOUNTER — Other Ambulatory Visit: Payer: Self-pay

## 2015-02-27 DIAGNOSIS — Z1231 Encounter for screening mammogram for malignant neoplasm of breast: Secondary | ICD-10-CM

## 2015-03-27 ENCOUNTER — Ambulatory Visit
Admission: RE | Admit: 2015-03-27 | Discharge: 2015-03-27 | Disposition: A | Discharge status: Home / Self Care | Payer: BLUE CROSS/BLUE SHIELD | Source: Ambulatory Visit

## 2015-03-27 DIAGNOSIS — Z1231 Encounter for screening mammogram for malignant neoplasm of breast: Secondary | ICD-10-CM

## 2015-11-03 ENCOUNTER — Encounter: Payer: Self-pay | Admitting: Internal Medicine

## 2016-02-19 ENCOUNTER — Other Ambulatory Visit: Payer: Self-pay

## 2016-02-19 DIAGNOSIS — Z1231 Encounter for screening mammogram for malignant neoplasm of breast: Secondary | ICD-10-CM

## 2016-03-28 ENCOUNTER — Ambulatory Visit
Admission: RE | Admit: 2016-03-28 | Discharge: 2016-03-28 | Disposition: A | Discharge status: Home / Self Care | Payer: BLUE CROSS/BLUE SHIELD | Source: Ambulatory Visit

## 2016-03-28 DIAGNOSIS — Z1231 Encounter for screening mammogram for malignant neoplasm of breast: Secondary | ICD-10-CM

## 2016-05-15 ENCOUNTER — Encounter: Payer: Self-pay | Admitting: Gastroenterology

## 2016-05-28 ENCOUNTER — Telehealth: Payer: Self-pay | Admitting: Gastroenterology

## 2016-05-28 NOTE — Telephone Encounter (Signed)
Patient advised recall is correct She is scheduled for colon in August and pre-visit

## 2016-08-01 ENCOUNTER — Other Ambulatory Visit: Payer: Self-pay | Admitting: Acute Care

## 2016-08-01 ENCOUNTER — Ambulatory Visit (AMBULATORY_SURGERY_CENTER): Payer: Self-pay

## 2016-08-01 VITALS — Ht 69.5 in | Wt 216.4 lb

## 2016-08-01 DIAGNOSIS — Z87891 Personal history of nicotine dependence: Secondary | ICD-10-CM

## 2016-08-01 DIAGNOSIS — Z1211 Encounter for screening for malignant neoplasm of colon: Secondary | ICD-10-CM

## 2016-08-01 DIAGNOSIS — Z860100 Personal history of colon polyps, unspecified: Secondary | ICD-10-CM

## 2016-08-01 MED ORDER — SUPREP BOWEL PREP KIT 17.5-3.13-1.6 GM/177ML PO SOLN: 1.0000 | Freq: Once | ORAL | 0 refills | Status: AC

## 2016-08-01 NOTE — Progress Notes (Signed)
No allergies to eggs or soy No past problems with anesthesia EXCEPT PONV No diet meds No home oxygen  Declined emmi

## 2016-08-22 ENCOUNTER — Encounter: Payer: Self-pay | Admitting: Gastroenterology

## 2016-08-22 ENCOUNTER — Ambulatory Visit (AMBULATORY_SURGERY_CENTER): Payer: Medicare Other | Admitting: Gastroenterology

## 2016-08-22 VITALS — BP 111/45 | HR 62 | Temp 97.8°F | Resp 20 | Ht 69.5 in | Wt 216.0 lb

## 2016-08-22 DIAGNOSIS — D122 Benign neoplasm of ascending colon: Secondary | ICD-10-CM | POA: Diagnosis not present

## 2016-08-22 DIAGNOSIS — F419 Anxiety disorder, unspecified: Secondary | ICD-10-CM | POA: Diagnosis not present

## 2016-08-22 DIAGNOSIS — K635 Polyp of colon: Secondary | ICD-10-CM | POA: Diagnosis not present

## 2016-08-22 DIAGNOSIS — D124 Benign neoplasm of descending colon: Secondary | ICD-10-CM

## 2016-08-22 DIAGNOSIS — Z86010 Personal history of colonic polyps: Secondary | ICD-10-CM

## 2016-08-22 DIAGNOSIS — K219 Gastro-esophageal reflux disease without esophagitis: Secondary | ICD-10-CM | POA: Diagnosis not present

## 2016-08-22 MED ORDER — SODIUM CHLORIDE 0.9 % IV SOLN: 500.0000 mL | INTRAVENOUS | Status: DC

## 2016-08-22 NOTE — Progress Notes (Signed)
Report to PACU, RN, vss, BBS= Clear.  

## 2016-08-22 NOTE — Op Note (Signed)
[place] Patient Name: [name] [name] Procedure Date: 08/22/2016 8:02 AM MRN: [id number] Endoscopist: Mauri Pole , MD Age: 65 Referring MD:  Date of Birth: 02-21-1951 Gender: Female Account #: [id number] Procedure:                Colonoscopy Indications:              High risk colon cancer surveillance: Personal                            history of colonic polyps Medicines:                Monitored Anesthesia Care Procedure:                Pre-Anesthesia Assessment:                           - Prior to the procedure, a History and Physical                            was performed, and patient medications and                            allergies were reviewed. The patient's tolerance of                            previous anesthesia was also reviewed. The risks                            and benefits of the procedure and the sedation                            options and risks were discussed with the patient.                            All questions were answered, and informed consent                            was obtained. Prior Anticoagulants: The patient has                            taken no previous anticoagulant or antiplatelet                            agents. ASA Grade Assessment: II - A patient with                            mild systemic disease. After reviewing the risks                            and benefits, the patient was deemed in                            satisfactory condition to undergo the procedure.  After obtaining informed consent, the colonoscope                            was passed under direct vision. Throughout the                            procedure, the patient's blood pressure, pulse, and                            oxygen saturations were monitored continuously. The                            Model CF-HQ190L [id number]) scope was introduced                            through the anus and advanced to  the the terminal                            ileum, with identification of the appendiceal                            orifice and IC valve. The colonoscopy was performed                            without difficulty. The patient tolerated the                            procedure well. The quality of the bowel                            preparation was good. The terminal ileum, ileocecal                            valve, appendiceal orifice, and rectum were                            photographed. Scope In: 8:18:24 AM Scope Out: 8:33:44 AM Scope Withdrawal Time: 0 hours 9 minutes 49 seconds  Total Procedure Duration: 0 hours 15 minutes 20 seconds  Findings:                 The perianal and digital rectal examinations were                            normal.                           A 6 mm polyp was found in the ascending colon. The                            polyp was sessile. The polyp was removed with a                            cold snare. Resection and retrieval were complete.  A 3 mm polyp was found in the descending colon. The                            polyp was sessile. The polyp was removed with a                            cold biopsy forceps. Resection and retrieval were                            complete.                           Scattered small-mouthed diverticula were found in                            the sigmoid colon.                           The exam was otherwise without abnormality. Complications:            No immediate complications. Estimated Blood Loss:     Estimated blood loss was minimal. Impression:               - One 6 mm polyp in the ascending colon, removed                            with a cold snare. Resected and retrieved.                           - One 3 mm polyp in the descending colon, removed                            with a cold biopsy forceps. Resected and retrieved.                           - Diverticulosis in the  sigmoid colon.                           - The examination was otherwise normal. Recommendation:           - Patient has a contact number available for                            emergencies. The signs and symptoms of potential                            delayed complications were discussed with the                            patient. Return to normal activities tomorrow.                            Written discharge instructions were provided to the  patient.                           - Resume previous diet.                           - Continue present medications.                           - Await pathology results.                           - Repeat colonoscopy in 5 years for surveillance. Mauri Pole, MD 08/22/2016 8:39:50 AM This report has been signed electronically.

## 2016-08-22 NOTE — Patient Instructions (Signed)
Colon polyps x 2 removed and diverticulosis seen. Handouts given polyps,diverticulosis. Result letter in your mail in 2-3 weeks. Repeat colonoscopy in 5 years. Call us with any questions or concerns. Thank you!  YOU HAD AN ENDOSCOPIC PROCEDURE TODAY AT [facility]:   Refer to the procedure report that was given to you for any specific questions about what was found during the examination.  If the procedure report does not answer your questions, please call your gastroenterologist to clarify.  If you requested that your care partner not be given the details of your procedure findings, then the procedure report has been included in a sealed envelope for you to review at your convenience later.  YOU SHOULD EXPECT: Some feelings of bloating in the abdomen. Passage of more gas than usual.  Walking can help get rid of the air that was put into your GI tract during the procedure and reduce the bloating. If you had a lower endoscopy (such as a colonoscopy or flexible sigmoidoscopy) you may notice spotting of blood in your stool or on the toilet paper. If you underwent a bowel prep for your procedure, you may not have a normal bowel movement for a few days.  Please Note:  You might notice some irritation and congestion in your nose or some drainage.  This is from the oxygen used during your procedure.  There is no need for concern and it should clear up in a day or so.  SYMPTOMS TO REPORT IMMEDIATELY:   Following lower endoscopy (colonoscopy or flexible sigmoidoscopy):  Excessive amounts of blood in the stool  Significant tenderness or worsening of abdominal pains  Swelling of the abdomen that is new, acute  Fever of 100F or higher  For urgent or emergent issues, a gastroenterologist can be reached at any hour by calling [phone].   DIET:  We do recommend a small meal at first, but then you may proceed to your regular diet.  Drink plenty of fluids but you should avoid alcoholic  beverages for 24 hours.  ACTIVITY:  You should plan to take it easy for the rest of today and you should NOT DRIVE or use heavy machinery until tomorrow (because of the sedation medicines used during the test).    FOLLOW UP: Our staff will call the number listed on your records the next business day following your procedure to check on you and address any questions or concerns that you may have regarding the information given to you following your procedure. If we do not reach you, we will leave a message.  However, if you are feeling well and you are not experiencing any problems, there is no need to return our call.  We will assume that you have returned to your regular daily activities without incident.  If any biopsies were taken you will be contacted by phone or by letter within the next 1-3 weeks.  Please call us at [phone] if you have not heard about the biopsies in 3 weeks.    SIGNATURES/CONFIDENTIALITY: You and/or your care partner have signed paperwork which will be entered into your electronic medical record.  These signatures attest to the fact that that the information above on your After Visit Summary has been reviewed and is understood.  Full responsibility of the confidentiality of this discharge information lies with you and/or your care-partner.

## 2016-08-22 NOTE — Progress Notes (Signed)
Called to room to assist during endoscopic procedure.  Patient ID and intended procedure confirmed with present staff. Received instructions for my participation in the procedure from the performing physician.  

## 2016-08-23 ENCOUNTER — Telehealth: Payer: Self-pay

## 2016-08-23 NOTE — Telephone Encounter (Signed)
  Follow up Call-  Call back number 08/22/2016  Post procedure Call Back phone  # [phone]  Permission to leave phone message Yes  Some recent data might be hidden     Patient questions:  Do you have a fever, pain , or abdominal swelling? No. Pain Score  0 *  Have you tolerated food without any problems? Yes.    Have you been able to return to your normal activities? Yes.    Do you have any questions about your discharge instructions: Diet   No. Medications  No. Follow up visit  No.  Do you have questions or concerns about your Care? No.  Actions: * If pain score is 4 or above: No action needed, pain <4.

## 2016-08-26 DIAGNOSIS — E7800 Pure hypercholesterolemia, unspecified: Secondary | ICD-10-CM | POA: Diagnosis not present

## 2016-08-26 DIAGNOSIS — G4762 Sleep related leg cramps: Secondary | ICD-10-CM | POA: Diagnosis not present

## 2016-08-28 DIAGNOSIS — F419 Anxiety disorder, unspecified: Secondary | ICD-10-CM | POA: Diagnosis not present

## 2016-08-28 DIAGNOSIS — Z23 Encounter for immunization: Secondary | ICD-10-CM | POA: Diagnosis not present

## 2016-09-01 ENCOUNTER — Encounter: Payer: Self-pay | Admitting: Gastroenterology

## 2016-09-05 ENCOUNTER — Inpatient Hospital Stay: Admission: RE | Admit: 2016-09-05 | Payer: Medicare Other | Source: Ambulatory Visit

## 2016-09-05 ENCOUNTER — Encounter: Payer: BLUE CROSS/BLUE SHIELD | Admitting: Acute Care

## 2016-11-05 DIAGNOSIS — X32XXXD Exposure to sunlight, subsequent encounter: Secondary | ICD-10-CM | POA: Diagnosis not present

## 2016-11-05 DIAGNOSIS — L308 Other specified dermatitis: Secondary | ICD-10-CM | POA: Diagnosis not present

## 2016-11-05 DIAGNOSIS — L570 Actinic keratosis: Secondary | ICD-10-CM | POA: Diagnosis not present

## 2016-11-05 DIAGNOSIS — R208 Other disturbances of skin sensation: Secondary | ICD-10-CM | POA: Diagnosis not present

## 2016-11-05 DIAGNOSIS — D225 Melanocytic nevi of trunk: Secondary | ICD-10-CM | POA: Diagnosis not present

## 2017-02-17 ENCOUNTER — Other Ambulatory Visit: Payer: Self-pay | Admitting: Internal Medicine

## 2017-02-17 DIAGNOSIS — Z1231 Encounter for screening mammogram for malignant neoplasm of breast: Secondary | ICD-10-CM

## 2017-03-17 DIAGNOSIS — Z0000 Encounter for general adult medical examination without abnormal findings: Secondary | ICD-10-CM | POA: Diagnosis not present

## 2017-03-17 DIAGNOSIS — E559 Vitamin D deficiency, unspecified: Secondary | ICD-10-CM | POA: Diagnosis not present

## 2017-03-17 DIAGNOSIS — E8809 Other disorders of plasma-protein metabolism, not elsewhere classified: Secondary | ICD-10-CM | POA: Diagnosis not present

## 2017-03-17 DIAGNOSIS — F419 Anxiety disorder, unspecified: Secondary | ICD-10-CM | POA: Diagnosis not present

## 2017-03-17 DIAGNOSIS — E7800 Pure hypercholesterolemia, unspecified: Secondary | ICD-10-CM | POA: Diagnosis not present

## 2017-03-20 DIAGNOSIS — Z91040 Latex allergy status: Secondary | ICD-10-CM | POA: Diagnosis not present

## 2017-03-20 DIAGNOSIS — K4090 Unilateral inguinal hernia, without obstruction or gangrene, not specified as recurrent: Secondary | ICD-10-CM | POA: Diagnosis not present

## 2017-03-20 DIAGNOSIS — Z1212 Encounter for screening for malignant neoplasm of rectum: Secondary | ICD-10-CM | POA: Diagnosis not present

## 2017-03-20 DIAGNOSIS — Z8639 Personal history of other endocrine, nutritional and metabolic disease: Secondary | ICD-10-CM | POA: Diagnosis not present

## 2017-03-20 DIAGNOSIS — Z23 Encounter for immunization: Secondary | ICD-10-CM | POA: Diagnosis not present

## 2017-03-20 DIAGNOSIS — E7800 Pure hypercholesterolemia, unspecified: Secondary | ICD-10-CM | POA: Diagnosis not present

## 2017-03-20 DIAGNOSIS — G43009 Migraine without aura, not intractable, without status migrainosus: Secondary | ICD-10-CM | POA: Diagnosis not present

## 2017-03-20 DIAGNOSIS — M545 Low back pain: Secondary | ICD-10-CM | POA: Diagnosis not present

## 2017-03-31 ENCOUNTER — Ambulatory Visit
Admission: RE | Admit: 2017-03-31 | Discharge: 2017-03-31 | Disposition: A | Discharge status: Home / Self Care | Payer: Medicare Other | Source: Ambulatory Visit | Attending: Internal Medicine | Admitting: Internal Medicine

## 2017-03-31 DIAGNOSIS — Z1231 Encounter for screening mammogram for malignant neoplasm of breast: Secondary | ICD-10-CM

## 2017-04-14 DIAGNOSIS — G2581 Restless legs syndrome: Secondary | ICD-10-CM | POA: Diagnosis not present

## 2017-04-14 DIAGNOSIS — M8580 Other specified disorders of bone density and structure, unspecified site: Secondary | ICD-10-CM | POA: Diagnosis not present

## 2017-04-14 DIAGNOSIS — M859 Disorder of bone density and structure, unspecified: Secondary | ICD-10-CM | POA: Diagnosis not present

## 2017-04-14 DIAGNOSIS — R0982 Postnasal drip: Secondary | ICD-10-CM | POA: Diagnosis not present

## 2017-05-20 DIAGNOSIS — H409 Unspecified glaucoma: Secondary | ICD-10-CM | POA: Diagnosis not present

## 2017-05-20 DIAGNOSIS — K635 Polyp of colon: Secondary | ICD-10-CM | POA: Diagnosis not present

## 2017-05-20 DIAGNOSIS — F418 Other specified anxiety disorders: Secondary | ICD-10-CM | POA: Diagnosis not present

## 2017-05-20 DIAGNOSIS — Z1389 Encounter for screening for other disorder: Secondary | ICD-10-CM | POA: Diagnosis not present

## 2017-05-20 DIAGNOSIS — J449 Chronic obstructive pulmonary disease, unspecified: Secondary | ICD-10-CM | POA: Diagnosis not present

## 2017-05-20 DIAGNOSIS — E038 Other specified hypothyroidism: Secondary | ICD-10-CM | POA: Diagnosis not present

## 2017-05-20 DIAGNOSIS — G43909 Migraine, unspecified, not intractable, without status migrainosus: Secondary | ICD-10-CM | POA: Diagnosis not present

## 2017-05-20 DIAGNOSIS — K219 Gastro-esophageal reflux disease without esophagitis: Secondary | ICD-10-CM | POA: Diagnosis not present

## 2017-05-29 DIAGNOSIS — J0100 Acute maxillary sinusitis, unspecified: Secondary | ICD-10-CM | POA: Diagnosis not present

## 2017-06-24 DIAGNOSIS — X32XXXD Exposure to sunlight, subsequent encounter: Secondary | ICD-10-CM | POA: Diagnosis not present

## 2017-06-24 DIAGNOSIS — L570 Actinic keratosis: Secondary | ICD-10-CM | POA: Diagnosis not present

## 2017-08-14 ENCOUNTER — Encounter: Payer: Self-pay | Admitting: Physician Assistant

## 2017-08-14 ENCOUNTER — Other Ambulatory Visit: Payer: Medicare Other

## 2017-08-14 ENCOUNTER — Ambulatory Visit (INDEPENDENT_AMBULATORY_CARE_PROVIDER_SITE_OTHER): Payer: Medicare Other | Admitting: Physician Assistant

## 2017-08-14 VITALS — BP 108/68 | HR 72 | Ht 69.5 in | Wt 226.0 lb

## 2017-08-14 DIAGNOSIS — R109 Unspecified abdominal pain: Secondary | ICD-10-CM

## 2017-08-14 DIAGNOSIS — K580 Irritable bowel syndrome with diarrhea: Secondary | ICD-10-CM | POA: Diagnosis not present

## 2017-08-14 DIAGNOSIS — R194 Change in bowel habit: Secondary | ICD-10-CM

## 2017-08-14 DIAGNOSIS — K219 Gastro-esophageal reflux disease without esophagitis: Secondary | ICD-10-CM | POA: Diagnosis not present

## 2017-08-14 MED ORDER — DICYCLOMINE HCL 10 MG PO CAPS
10.0000 mg | ORAL_CAPSULE | Freq: Three times a day (TID) | ORAL | 1 refills | Status: DC
Start: 2017-08-14 — End: 2017-10-20

## 2017-08-14 NOTE — Progress Notes (Signed)
Chief Complaint: Abdominal pain, change in bowel habits, fecal incontinence  HPI:  [name] [name] is a 66 year old Caucasian female with a past medical history as listed below including irritable bowel syndrome,  who presents to clinic today with a complaint of abdominal pain, change in bowel habits and fecal incontinence.     Patient was previously followed by Dr. Olevia Perches for her irritable bowel syndrome and right upper quadrant abdominal discomfort. She has been on trials of Align and Flagyl in the past as well as Dicyclomine. She has had multiple colonoscopies which have been normal other than polyps. The last 08/22/16 with Dr. Silverio Decamp which showed a 6 mm polyp in the ascending colon, 3 mm polyp in the descending colon and diverticulosis in the sigmoid colon. Pathology revealed a sessile serrated/adenoma polyp. Repeat was recommended in 5 years.   Today, the patient tells me that she had been doing fairly well for the past 2-3 years, but over the past 6 months or so has had an increase in some fecal incontinence. She tells me that typically with activity/after eating she will have a loose incontinent stool. She was told during time of her colonoscopy to start doing Keigel exercises. She has been trying to do this since that time. Most recently over the past month and a half patient's stools have been worse. She notes that she will have at least 4-5 before she leaves the house around 10:00 AM. These are loose but still somewhat formed until the last one which "just falls apart". Patient does tell me that her stools are somewhat unpredictable at this point and she has had some accidents while walking.   Patient also describes various abdominal discomforts. She explains that sometimes she is tender across her lower abdomen unrelated to bowel movements. She is unable to tell me much about timing of this abdominal pain but it "comes and goes". Patient also describes a right upper quadrant discomfort which feels  like "something kinks in there sometimes". This is typically worse with movement and is better if she sits down or takes a Zantac, but no different with eating.   Also describes heartburn and reflux today worse with certain foods. She can avoid these symptoms as long as "I eat the right thing". She does use Zantac when necessary.   Patient believes all of her symptoms started after using Augmentin prescribed by her PCP about a month and a half ago and changing from Lipitor to Crestor. She has stopped eating breads and muffins and feels as though everything has gotten a little bit better. She has also started a probiotic 3 weeks ago.   Patient denies a fever, chills, blood in her stool, melena, weight loss, fatigue, anorexia, nausea, vomiting, dysphagia or symptoms that awaken her at night.  Past Medical History:  Diagnosis Date  . Adenomatous colon polyp   . Allergy   . Anxiety   . Cancer ([place])    skin  . COPD (chronic obstructive pulmonary disease) ([place])   . Depression   . Diverticulosis   . Endometriosis   . GERD (gastroesophageal reflux disease)   . Glaucoma   . Hiatal hernia   . IBS (irritable bowel syndrome)   . Inguinal hernia   . Pneumothorax     Past Surgical History:  Procedure Laterality Date  . ABDOMINAL HYSTERECTOMY    . PLEURAL SCARIFICATION    . ruptured disk    . torn tendon    . TUBAL LIGATION  Current Outpatient Prescriptions  Medication Sig Dispense Refill  . Probiotic Product (PROBIOTIC-10 PO) Take by mouth daily.    Marland Kitchen ALPRAZolam (XANAX) 0.25 MG tablet     . Cholecalciferol (VITAMIN D PO) Take 3,000 Units by mouth.    . dicyclomine (BENTYL) 10 MG capsule Take 1 capsule (10 mg total) by mouth 4 (four) times daily -  before meals and at bedtime. 120 capsule 1  . escitalopram (LEXAPRO) 10 MG tablet 1 TABLET ONCE A DAY ORALLY 30 DAY(S)  6  . ibuprofen (ADVIL) 200 MG tablet Take 200 mg by mouth every 6 (six) hours as needed for pain.    . promethazine  (PHENERGAN) 25 MG tablet Take 25 mg by mouth every 6 (six) hours as needed for nausea or vomiting.    . ranitidine (ZANTAC) 150 MG capsule Take 150 mg by mouth as needed for heartburn.    . rosuvastatin (CRESTOR) 5 MG tablet 1 TABLET ONCE A DAY ORALLY 30 DAY(S)  6   Current Facility-Administered Medications  Medication Dose Route Frequency Provider Last Rate Last Dose  . 0.9 %  sodium chloride infusion  500 mL Intravenous Continuous Mauri Pole, MD        Allergies as of 08/14/2017 - Review Complete 08/14/2017  Allergen Reaction Noted  . Cefaclor  10/17/2008  . Latex Other (See Comments) 11/01/2012  . Ofloxacin  10/17/2008  . Sulfur  07/06/2013    Family History  Problem Relation Age of Onset  . Heart failure Mother   . Thyroid cancer Maternal Grandmother   . Osteoporosis Sister   . Crohn's disease Sister   . Neuropathy Sister   . Thyroid disease Sister   . Lung disease Sister   . Colon polyps Sister   . Colon cancer Neg Hx     Social History   Social History  . Marital status: Married    Spouse name: N/A  . Number of children: 2  . Years of education: N/A   Occupational History  . locksmith    Social History Main Topics  . Smoking status: Former Smoker    Types: Cigarettes    Start date: 01/31/2012  . Smokeless tobacco: Never Used  . Alcohol use No  . Drug use: No  . Sexual activity: No   Other Topics Concern  . Not on file   Social History Narrative  . No narrative on file    Review of Systems:    Constitutional: No weight loss, fever or chills Cardiovascular: No chest pain Respiratory: No SOB  Gastrointestinal: See HPI and otherwise negative   Physical Exam:  Vital signs: BP 108/68   Pulse 72   Ht 5' 9.5" (1.765 m)   Wt 226 lb (102.5 kg)   BMI 32.90 kg/m   Constitutional:   Pleasant Caucasian female appears to be in NAD, Well developed, Well nourished, alert and cooperative Head:  Normocephalic and atraumatic. Eyes:   PEERL, EOMI. No  icterus. Conjunctiva pink. Ears:  Normal auditory acuity. Neck:  Supple Throat: Oral cavity and pharynx without inflammation, swelling or lesion.  Respiratory: Respirations even and unlabored. Lungs clear to auscultation bilaterally.   No wheezes, crackles, or rhonchi.  Cardiovascular: Normal S1, S2. No MRG. Regular rate and rhythm. No peripheral edema, cyanosis or pallor.  Gastrointestinal:  Soft, nondistended, mild ttp b/l lower abdomen No rebound or guarding. Normal bowel sounds. No appreciable masses or hepatomegaly. Rectal:  Not performed.  Msk:  Symmetrical without gross deformities. Without edema, no  deformity or joint abnormality.  Neurologic:  Alert and  oriented x4;  grossly normal neurologically.  Skin:   Dry and intact without significant lesions or rashes. Psychiatric:  Demonstrates good judgement and reason without abnormal affect or behaviors.  Recent labs: Patient reports by PCP  Assessment: 1. Abdominal pain: Patient describes right upper quadrant "kink", better with Zantac or change in position, consider relation to gastritis versus musculoskeletal versus IBS 2. GERD: Controlled with diet modification and Zantac when necessary 3. Fecal incontinence: Worse over the past month and a half since use of antibiotic and change in her statin medication; consider hemorrhoids versus IBS relation 4. IBS: previously diagnosed, believe most of her symptoms today are related to this  Plan: 1. Requested recent labs from PCP 2. Recommend the patient uses her Zantac once daily for the next month or so to see if this helps with RUQ pain 3. Recommend patient continue her daily probiotic 4. Ordered stool studies to include a GI pathogen panel and lactoferrin 5. Prescribe Dicyclomine 10 mg 4 times a day, 30 minutes before meals and at bedtime #120 with 1 refill 6. Discussed with the patient that she may try IB Guard in the future if her symptoms continue. 7. Patient to return to clinic with  Dr. Silverio Decamp or myself in 4-6 weeks  Ellouise Newer, PA-C [facility] 08/14/2017, 11:10 AM  Cc: Deland Pretty, MD

## 2017-08-14 NOTE — Patient Instructions (Signed)
Your physician has requested that you go to the basement for lab work before leaving today.  We have sent the following medications to your pharmacy for you to pick up at your convenience: Dicyclomine 10 mg four times a day, 30 mins before meals and at bedtime  Use zantac daily.   Continue daily probiotic

## 2017-08-15 ENCOUNTER — Other Ambulatory Visit: Payer: Medicare Other

## 2017-08-15 DIAGNOSIS — R109 Unspecified abdominal pain: Secondary | ICD-10-CM | POA: Diagnosis not present

## 2017-08-15 DIAGNOSIS — K219 Gastro-esophageal reflux disease without esophagitis: Secondary | ICD-10-CM

## 2017-08-15 DIAGNOSIS — R194 Change in bowel habit: Secondary | ICD-10-CM | POA: Diagnosis not present

## 2017-08-15 NOTE — Progress Notes (Signed)
Reviewed and agree with documentation and assessment and plan. K. Veena Reisa Coppola , MD   

## 2017-08-18 LAB — GASTROINTESTINAL PATHOGEN PANEL PCR
C. difficile Tox A/B, PCR: NOT DETECTED
CAMPYLOBACTER, PCR: NOT DETECTED
Cryptosporidium, PCR: NOT DETECTED
E COLI (ETEC) LT/ST, PCR: NOT DETECTED
E COLI (STEC) STX1/STX2, PCR: NOT DETECTED
E COLI 0157, PCR: NOT DETECTED
Giardia lamblia, PCR: NOT DETECTED
Norovirus, PCR: NOT DETECTED
Rotavirus A, PCR: NOT DETECTED
SALMONELLA, PCR: NOT DETECTED
SHIGELLA, PCR: NOT DETECTED

## 2017-08-18 LAB — FECAL LACTOFERRIN, QUANT: LACTOFERRIN: NEGATIVE

## 2017-10-10 DIAGNOSIS — Z23 Encounter for immunization: Secondary | ICD-10-CM | POA: Diagnosis not present

## 2017-10-20 ENCOUNTER — Other Ambulatory Visit (INDEPENDENT_AMBULATORY_CARE_PROVIDER_SITE_OTHER): Payer: Medicare Other

## 2017-10-20 ENCOUNTER — Ambulatory Visit (INDEPENDENT_AMBULATORY_CARE_PROVIDER_SITE_OTHER): Payer: Medicare Other | Admitting: Gastroenterology

## 2017-10-20 ENCOUNTER — Encounter: Payer: Self-pay | Admitting: Gastroenterology

## 2017-10-20 VITALS — BP 118/80 | HR 72 | Ht 68.75 in | Wt 229.2 lb

## 2017-10-20 DIAGNOSIS — R1011 Right upper quadrant pain: Secondary | ICD-10-CM

## 2017-10-20 DIAGNOSIS — K589 Irritable bowel syndrome without diarrhea: Secondary | ICD-10-CM

## 2017-10-20 DIAGNOSIS — R152 Fecal urgency: Secondary | ICD-10-CM

## 2017-10-20 DIAGNOSIS — Z86010 Personal history of colonic polyps: Secondary | ICD-10-CM | POA: Diagnosis not present

## 2017-10-20 DIAGNOSIS — R159 Full incontinence of feces: Secondary | ICD-10-CM

## 2017-10-20 DIAGNOSIS — H2513 Age-related nuclear cataract, bilateral: Secondary | ICD-10-CM | POA: Diagnosis not present

## 2017-10-20 LAB — COMPREHENSIVE METABOLIC PANEL
ALBUMIN: 4.1 g/dL (ref 3.5–5.2)
ALT: 10 U/L (ref 0–35)
AST: 13 U/L (ref 0–37)
Alkaline Phosphatase: 77 U/L (ref 39–117)
BUN: 17 mg/dL (ref 6–23)
CHLORIDE: 106 meq/L (ref 96–112)
CO2: 28 mEq/L (ref 19–32)
CREATININE: 0.99 mg/dL (ref 0.40–1.20)
Calcium: 9.5 mg/dL (ref 8.4–10.5)
GFR: 59.61 mL/min — ABNORMAL LOW (ref >60.00)
GLUCOSE: 97 mg/dL (ref 70–99)
POTASSIUM: 4.7 meq/L (ref 3.5–5.1)
SODIUM: 142 meq/L (ref 135–145)
Total Bilirubin: 0.4 mg/dL (ref 0.2–1.2)
Total Protein: 7.3 g/dL (ref 6.0–8.3)

## 2017-10-20 LAB — LIPASE: LIPASE: 6 U/L — AB (ref 11.0–59.0)

## 2017-10-20 NOTE — Patient Instructions (Signed)
Go to the basement for labs today   You have been scheduled for an abdominal ultrasound at [facility] (1st floor of hospital) on 10/23/2017 at 10:30am. Please arrive 15 minutes prior to your appointment for registration. Make certain not to have anything to eat or drink 6 hours prior to your appointment. Should you need to reschedule your appointment, please contact radiology at [phone]. This test typically takes about 30 minutes to perform.  We will give you samples of IBGard to take as directed on the box  Take fiber choice daily

## 2017-10-20 NOTE — Progress Notes (Signed)
[name] [name]    [id number]    March 28, 1951  Primary Care Physician:Pharr, Thayer Jew, MD  Referring Physician: Deland Pretty, MD [facility] [place], [place] [address]  Chief complaint: Right upper quadrant abdominal pain, fecal incontinence  HPI:  66 year old female previously followed by Dr. Olevia Perches, last seen by Ellouise Newer 08/14/17 here for follow up visit. She continues to have episodes of fecal incontinence mostly when she sits on toilet has leakage of bowel with no effort, has to go to bathroom multiple times usually within couple hours in the morning before she feels she has completely evacuated. She is unable to push or evacuate at once.  She has not been doing Kegle exercises.  Continues to have intermittent right upper quadrant abdominal pain.  Abdominal pain is sometimes worse with change in position.  No relationship with diet.  No recent labs or abdominal ultrasound Colonoscopy August 2017 with removal of sessile polyps, recall in 5 years   Outpatient Encounter Prescriptions as of 10/20/2017  Medication Sig  . ALPRAZolam (XANAX) 0.25 MG tablet   . Cholecalciferol (VITAMIN D PO) Take 3,000 Units by mouth.  . escitalopram (LEXAPRO) 10 MG tablet take 1  1/2 tablet by mouth once a day  . ibuprofen (ADVIL) 200 MG tablet Take 200 mg by mouth every 6 (six) hours as needed for pain.  . Probiotic Product (PROBIOTIC-10 PO) Take by mouth daily.  . promethazine (PHENERGAN) 25 MG tablet Take 25 mg by mouth every 6 (six) hours as needed for nausea or vomiting.  . ranitidine (ZANTAC) 150 MG capsule Take 150 mg by mouth as needed for heartburn.  . rosuvastatin (CRESTOR) 5 MG tablet 1 TABLET ONCE A DAY ORALLY 30 DAY(S)  . [DISCONTINUED] dicyclomine (BENTYL) 10 MG capsule Take 1 capsule (10 mg total) by mouth 4 (four) times daily -  before meals and at bedtime.   Facility-Administered Encounter Medications as of 10/20/2017  Medication  . 0.9 %  sodium chloride  infusion    Allergies as of 10/20/2017 - Review Complete 10/20/2017  Allergen Reaction Noted  . Cefaclor  10/17/2008  . Latex Other (See Comments) 11/01/2012  . Ofloxacin  10/17/2008  . Sulfur  07/06/2013    Past Medical History:  Diagnosis Date  . Adenomatous colon polyp   . Allergy   . Anxiety   . Cancer ([place])    skin  . COPD (chronic obstructive pulmonary disease) ([place])   . Depression   . Diverticulosis   . Endometriosis   . GERD (gastroesophageal reflux disease)   . Glaucoma   . Hiatal hernia   . IBS (irritable bowel syndrome)   . Inguinal hernia   . Pneumothorax     Past Surgical History:  Procedure Laterality Date  . ABDOMINAL HYSTERECTOMY    . PLEURAL SCARIFICATION    . ruptured disk    . torn tendon    . TUBAL LIGATION      Family History  Problem Relation Age of Onset  . Heart failure Mother   . Thyroid cancer Maternal Grandmother   . Osteoporosis Sister   . Crohn's disease Sister   . Neuropathy Sister   . Thyroid disease Sister   . Lung disease Sister   . Colon polyps Sister   . Colon cancer Neg Hx     Social History   Social History  . Marital status: Married    Spouse name: N/A  . Number of  children: 2  . Years of education: N/A   Occupational History  . locksmith    Social History Main Topics  . Smoking status: Former Smoker    Types: Cigarettes    Start date: 01/31/2012  . Smokeless tobacco: Never Used  . Alcohol use No  . Drug use: No  . Sexual activity: No   Other Topics Concern  . Not on file   Social History Narrative  . No narrative on file      Review of systems: Review of Systems  Constitutional: Negative for fever and chills.  HENT: Negative.   Eyes: Negative for blurred vision.  Respiratory: Negative for cough, shortness of breath and wheezing.   Cardiovascular: Negative for chest pain and palpitations.  Gastrointestinal: as per HPI Genitourinary: Negative for dysuria, urgency, frequency and hematuria.    Musculoskeletal: Negative for myalgias, back pain and joint pain.  Skin: Negative for itching and rash.  Neurological: Negative for dizziness, tremors, focal weakness, seizures and loss of consciousness.  Endo/Heme/Allergies: Positive for seasonal allergies.  Psychiatric/Behavioral: Negative for depression, suicidal ideas and hallucinations.  All other systems reviewed and are negative.   Physical Exam: Vitals:   10/20/17 0951  BP: 118/80  Pulse: 72   Body mass index is 34.1 kg/m. Gen:      No acute distress HEENT:  EOMI, sclera anicteric Neck:     No masses; no thyromegaly Lungs:    Clear to auscultation bilaterally; normal respiratory effort CV:         Regular rate and rhythm; no murmurs Abd:      + bowel sounds; soft, non-tender; no palpable masses, no distension Ext:    No edema; adequate peripheral perfusion Skin:      Warm and dry; no rash Neuro: alert and oriented x 3 Psych: normal mood and affect  Data Reviewed:  Reviewed labs, radiology imaging, old records and pertinent past GI work up   Assessment and Plan/Recommendations:  38 yr F with h/o chronic IBS, intermittent RUQ abdominal pain and fecal incontinence  Fecal incontinence: Patient has modified her lifestyle and is not bothered by her symptoms currently and she does not want to do any further testing or physical therapy Advised patient to start Fiberchoice 2-3 tablets daily  Right upper quadrant abdominal pain: Check BMP, LFT, lipase Abdominal ultrasound   Rectal cancer screening: High risk with history of polyps, adenoma Due for recall colonoscopy in 2022  25 minutes was spent face-to-face with the patient. Greater than 50% of the time used for counseling as well as treatment plan and follow-up. She had multiple questions which were answered to her satisfaction  K. Denzil Magnuson , MD [phone] Mon-Fri 8a-5p [phone] after 5p, weekends, holidays  CC: Deland Pretty, MD

## 2017-10-21 DIAGNOSIS — M47812 Spondylosis without myelopathy or radiculopathy, cervical region: Secondary | ICD-10-CM | POA: Diagnosis not present

## 2017-10-21 DIAGNOSIS — M7032 Other bursitis of elbow, left elbow: Secondary | ICD-10-CM | POA: Diagnosis not present

## 2017-10-23 ENCOUNTER — Ambulatory Visit (HOSPITAL_COMMUNITY)
Admission: RE | Admit: 2017-10-23 | Discharge: 2017-10-23 | Disposition: A | Discharge status: Home / Self Care | Payer: Medicare Other | Source: Ambulatory Visit | Attending: Gastroenterology | Admitting: Gastroenterology

## 2017-10-23 DIAGNOSIS — K589 Irritable bowel syndrome without diarrhea: Secondary | ICD-10-CM | POA: Diagnosis not present

## 2017-10-23 DIAGNOSIS — R1011 Right upper quadrant pain: Secondary | ICD-10-CM | POA: Insufficient documentation

## 2017-10-23 DIAGNOSIS — R152 Fecal urgency: Secondary | ICD-10-CM | POA: Insufficient documentation

## 2017-10-23 DIAGNOSIS — R143 Flatulence: Secondary | ICD-10-CM | POA: Insufficient documentation

## 2017-10-23 DIAGNOSIS — R159 Full incontinence of feces: Secondary | ICD-10-CM

## 2018-03-25 DIAGNOSIS — E7800 Pure hypercholesterolemia, unspecified: Secondary | ICD-10-CM | POA: Diagnosis not present

## 2018-03-25 DIAGNOSIS — E559 Vitamin D deficiency, unspecified: Secondary | ICD-10-CM | POA: Diagnosis not present

## 2018-03-30 DIAGNOSIS — M8580 Other specified disorders of bone density and structure, unspecified site: Secondary | ICD-10-CM | POA: Diagnosis not present

## 2018-03-30 DIAGNOSIS — F419 Anxiety disorder, unspecified: Secondary | ICD-10-CM | POA: Diagnosis not present

## 2018-03-30 DIAGNOSIS — Z1159 Encounter for screening for other viral diseases: Secondary | ICD-10-CM | POA: Diagnosis not present

## 2018-03-30 DIAGNOSIS — R0982 Postnasal drip: Secondary | ICD-10-CM | POA: Diagnosis not present

## 2018-03-30 DIAGNOSIS — Z6834 Body mass index (BMI) 34.0-34.9, adult: Secondary | ICD-10-CM | POA: Diagnosis not present

## 2018-03-30 DIAGNOSIS — E7800 Pure hypercholesterolemia, unspecified: Secondary | ICD-10-CM | POA: Diagnosis not present

## 2018-03-30 DIAGNOSIS — R3129 Other microscopic hematuria: Secondary | ICD-10-CM | POA: Diagnosis not present

## 2018-03-30 DIAGNOSIS — R05 Cough: Secondary | ICD-10-CM | POA: Diagnosis not present

## 2018-03-30 DIAGNOSIS — G43009 Migraine without aura, not intractable, without status migrainosus: Secondary | ICD-10-CM | POA: Diagnosis not present

## 2018-03-30 DIAGNOSIS — Z0001 Encounter for general adult medical examination with abnormal findings: Secondary | ICD-10-CM | POA: Diagnosis not present

## 2018-03-30 DIAGNOSIS — Z7289 Other problems related to lifestyle: Secondary | ICD-10-CM | POA: Diagnosis not present

## 2018-03-30 DIAGNOSIS — M5030 Other cervical disc degeneration, unspecified cervical region: Secondary | ICD-10-CM | POA: Diagnosis not present

## 2018-03-30 DIAGNOSIS — E039 Hypothyroidism, unspecified: Secondary | ICD-10-CM | POA: Diagnosis not present

## 2018-03-30 DIAGNOSIS — K4090 Unilateral inguinal hernia, without obstruction or gangrene, not specified as recurrent: Secondary | ICD-10-CM | POA: Diagnosis not present

## 2018-03-30 DIAGNOSIS — Z23 Encounter for immunization: Secondary | ICD-10-CM | POA: Diagnosis not present

## 2018-04-27 DIAGNOSIS — F339 Major depressive disorder, recurrent, unspecified: Secondary | ICD-10-CM | POA: Diagnosis not present

## 2018-04-28 DIAGNOSIS — R311 Benign essential microscopic hematuria: Secondary | ICD-10-CM | POA: Diagnosis not present

## 2018-05-08 DIAGNOSIS — S86312A Strain of muscle(s) and tendon(s) of peroneal muscle group at lower leg level, left leg, initial encounter: Secondary | ICD-10-CM | POA: Diagnosis not present

## 2018-05-26 DIAGNOSIS — H409 Unspecified glaucoma: Secondary | ICD-10-CM | POA: Diagnosis not present

## 2018-05-26 DIAGNOSIS — K635 Polyp of colon: Secondary | ICD-10-CM | POA: Diagnosis not present

## 2018-05-26 DIAGNOSIS — K589 Irritable bowel syndrome without diarrhea: Secondary | ICD-10-CM | POA: Diagnosis not present

## 2018-05-26 DIAGNOSIS — E049 Nontoxic goiter, unspecified: Secondary | ICD-10-CM | POA: Diagnosis not present

## 2018-05-26 DIAGNOSIS — J449 Chronic obstructive pulmonary disease, unspecified: Secondary | ICD-10-CM | POA: Diagnosis not present

## 2018-05-26 DIAGNOSIS — Z1389 Encounter for screening for other disorder: Secondary | ICD-10-CM | POA: Diagnosis not present

## 2018-05-26 DIAGNOSIS — Z6833 Body mass index (BMI) 33.0-33.9, adult: Secondary | ICD-10-CM | POA: Diagnosis not present

## 2018-05-26 DIAGNOSIS — F418 Other specified anxiety disorders: Secondary | ICD-10-CM | POA: Diagnosis not present

## 2018-06-03 DIAGNOSIS — F339 Major depressive disorder, recurrent, unspecified: Secondary | ICD-10-CM | POA: Diagnosis not present

## 2018-06-03 DIAGNOSIS — F419 Anxiety disorder, unspecified: Secondary | ICD-10-CM | POA: Diagnosis not present

## 2018-06-11 DIAGNOSIS — R350 Frequency of micturition: Secondary | ICD-10-CM | POA: Diagnosis not present

## 2018-06-11 DIAGNOSIS — R311 Benign essential microscopic hematuria: Secondary | ICD-10-CM | POA: Diagnosis not present

## 2018-06-11 DIAGNOSIS — N393 Stress incontinence (female) (male): Secondary | ICD-10-CM | POA: Diagnosis not present

## 2018-06-26 DIAGNOSIS — F418 Other specified anxiety disorders: Secondary | ICD-10-CM | POA: Diagnosis not present

## 2018-06-30 DIAGNOSIS — Z01419 Encounter for gynecological examination (general) (routine) without abnormal findings: Secondary | ICD-10-CM | POA: Diagnosis not present

## 2018-06-30 DIAGNOSIS — E7800 Pure hypercholesterolemia, unspecified: Secondary | ICD-10-CM | POA: Diagnosis not present

## 2018-07-09 DIAGNOSIS — K5730 Diverticulosis of large intestine without perforation or abscess without bleeding: Secondary | ICD-10-CM | POA: Diagnosis not present

## 2018-07-09 DIAGNOSIS — R311 Benign essential microscopic hematuria: Secondary | ICD-10-CM | POA: Diagnosis not present

## 2018-07-24 DIAGNOSIS — R350 Frequency of micturition: Secondary | ICD-10-CM | POA: Diagnosis not present

## 2018-07-24 DIAGNOSIS — R311 Benign essential microscopic hematuria: Secondary | ICD-10-CM | POA: Diagnosis not present

## 2018-10-16 DIAGNOSIS — Z23 Encounter for immunization: Secondary | ICD-10-CM | POA: Diagnosis not present

## 2018-10-19 DIAGNOSIS — M7581 Other shoulder lesions, right shoulder: Secondary | ICD-10-CM | POA: Diagnosis not present

## 2018-10-21 DIAGNOSIS — H2513 Age-related nuclear cataract, bilateral: Secondary | ICD-10-CM | POA: Diagnosis not present

## 2018-10-21 DIAGNOSIS — H52221 Regular astigmatism, right eye: Secondary | ICD-10-CM | POA: Diagnosis not present

## 2018-10-21 DIAGNOSIS — H5203 Hypermetropia, bilateral: Secondary | ICD-10-CM | POA: Diagnosis not present

## 2018-10-21 DIAGNOSIS — H40053 Ocular hypertension, bilateral: Secondary | ICD-10-CM | POA: Diagnosis not present

## 2018-10-21 DIAGNOSIS — H40013 Open angle with borderline findings, low risk, bilateral: Secondary | ICD-10-CM | POA: Diagnosis not present

## 2018-11-23 DIAGNOSIS — M25512 Pain in left shoulder: Secondary | ICD-10-CM | POA: Diagnosis not present

## 2018-11-23 DIAGNOSIS — M25551 Pain in right hip: Secondary | ICD-10-CM | POA: Diagnosis not present

## 2018-12-02 DIAGNOSIS — M5030 Other cervical disc degeneration, unspecified cervical region: Secondary | ICD-10-CM | POA: Diagnosis not present

## 2018-12-02 DIAGNOSIS — M25552 Pain in left hip: Secondary | ICD-10-CM | POA: Diagnosis not present

## 2018-12-02 DIAGNOSIS — M545 Low back pain: Secondary | ICD-10-CM | POA: Diagnosis not present

## 2018-12-02 DIAGNOSIS — M2550 Pain in unspecified joint: Secondary | ICD-10-CM | POA: Diagnosis not present

## 2018-12-07 DIAGNOSIS — M5136 Other intervertebral disc degeneration, lumbar region: Secondary | ICD-10-CM | POA: Diagnosis not present

## 2018-12-15 DIAGNOSIS — H25013 Cortical age-related cataract, bilateral: Secondary | ICD-10-CM | POA: Diagnosis not present

## 2018-12-15 DIAGNOSIS — H2513 Age-related nuclear cataract, bilateral: Secondary | ICD-10-CM | POA: Diagnosis not present

## 2018-12-15 DIAGNOSIS — H18413 Arcus senilis, bilateral: Secondary | ICD-10-CM | POA: Diagnosis not present

## 2018-12-15 DIAGNOSIS — H25043 Posterior subcapsular polar age-related cataract, bilateral: Secondary | ICD-10-CM | POA: Diagnosis not present

## 2018-12-15 DIAGNOSIS — H02831 Dermatochalasis of right upper eyelid: Secondary | ICD-10-CM | POA: Diagnosis not present

## 2018-12-15 DIAGNOSIS — H2511 Age-related nuclear cataract, right eye: Secondary | ICD-10-CM | POA: Diagnosis not present

## 2019-01-05 ENCOUNTER — Encounter: Payer: Self-pay | Admitting: Gastroenterology

## 2019-01-05 ENCOUNTER — Ambulatory Visit (INDEPENDENT_AMBULATORY_CARE_PROVIDER_SITE_OTHER): Payer: Medicare Other | Admitting: Gastroenterology

## 2019-01-05 VITALS — BP 128/82 | HR 86 | Ht 69.5 in | Wt 226.0 lb

## 2019-01-05 DIAGNOSIS — R194 Change in bowel habit: Secondary | ICD-10-CM

## 2019-01-05 DIAGNOSIS — R1032 Left lower quadrant pain: Secondary | ICD-10-CM | POA: Diagnosis not present

## 2019-01-05 MED ORDER — CIPROFLOXACIN HCL 500 MG PO TABS: 500.0000 mg | ORAL_TABLET | Freq: Two times a day (BID) | ORAL | 0 refills | Status: DC

## 2019-01-05 MED ORDER — METRONIDAZOLE 500 MG PO TABS: 500.0000 mg | ORAL_TABLET | Freq: Three times a day (TID) | ORAL | 0 refills | Status: DC

## 2019-01-05 NOTE — Progress Notes (Signed)
01/05/2019 [name] [name] [id number] 1951-01-14   HISTORY OF PRESENT ILLNESS: This is a 68 year old female who is a patient of Dr. Remer Macho.  She was last seen here in October 2018.  She had been seen here previously for complaints of right upper quadrant pain over the years in the past.  Today she presents with complaints of left lower quadrant abdominal pain that has been present for the past 1.5 weeks or so.  She says it is constant pain/pressure.  Is relieved a little when she has a bowel movement but then comes right back.  She has seen some mucus in her stool, but no blood.  Has had some loose stools once daily over the past week or so as well.  Last colonoscopy was in August 2017 at which time she was found to have 2 small polyps that were removed and sigmoid diverticulosis.  She was questioning if this could be due to a hernia that she was told that she had previously.   Past Medical History:  Diagnosis Date  . Adenomatous colon polyp   . Allergy   . Anxiety   . Cancer ([place])    skin  . Cataract    bilateral  . COPD (chronic obstructive pulmonary disease) ([place])   . Depression   . Diverticulosis   . Endometriosis   . GERD (gastroesophageal reflux disease)   . Glaucoma   . Hiatal hernia   . IBS (irritable bowel syndrome)   . Inguinal hernia   . Pneumothorax    Past Surgical History:  Procedure Laterality Date  . ABDOMINAL HYSTERECTOMY    . PLEURAL SCARIFICATION    . ruptured disk    . torn tendon    . TUBAL LIGATION      reports that she has quit smoking. Her smoking use included cigarettes. She started smoking about 6 years ago. She has never used smokeless tobacco. She reports that she does not drink alcohol or use drugs. family history includes Colon polyps in her sister; Crohn's disease in her sister; Heart failure in her mother; Lung disease in her sister; Neuropathy in her sister; Osteoporosis in her sister; Thyroid cancer in her maternal grandmother; Thyroid  disease in her sister. Allergies  Allergen Reactions  . Cefaclor   . Latex Other (See Comments)    blister  . Ofloxacin   . Sulfur     Thrush      Outpatient Encounter Medications as of 01/05/2019  Medication Sig  . ALPRAZolam (XANAX) 0.25 MG tablet   . Cholecalciferol (VITAMIN D PO) Take 3,000 Units by mouth.  Marland Kitchen ibuprofen (ADVIL) 200 MG tablet Take 200 mg by mouth every 6 (six) hours as needed for pain.  . promethazine (PHENERGAN) 25 MG tablet Take 25 mg by mouth every 6 (six) hours as needed for nausea or vomiting.  . ranitidine (ZANTAC) 150 MG capsule Take 150 mg by mouth as needed for heartburn.  . rosuvastatin (CRESTOR) 5 MG tablet 1 TABLET ONCE A DAY ORALLY 30 DAY(S)  . venlafaxine (EFFEXOR) 37.5 MG tablet Take 37.5 mg by mouth daily.  . [DISCONTINUED] escitalopram (LEXAPRO) 10 MG tablet take 1  1/2 tablet by mouth once a day  . [DISCONTINUED] Probiotic Product (PROBIOTIC-10 PO) Take by mouth daily.  . [DISCONTINUED] 0.9 %  sodium chloride infusion    No facility-administered encounter medications on file as of 01/05/2019.      REVIEW OF SYSTEMS  : All other systems reviewed and negative except  where noted in the History of Present Illness.   PHYSICAL EXAM: BP 128/82   Pulse 86   Ht 5' 9.5" (1.765 m)   Wt 226 lb (102.5 kg)   BMI 32.90 kg/m  General: Well developed white female in no acute distress Head: Normocephalic and atraumatic Eyes:  Sclerae anicteric, conjunctiva pink. Ears: Normal auditory acuity Lungs: Clear throughout to auscultation; no increased WOB. Heart: Regular rate and rhythm; no M/R/G. Abdomen: Soft, non-distended.  BS present.  Moderate LLQ and suprapubic TTP. Musculoskeletal: Symmetrical with no gross deformities  Skin: No lesions on visible extremities Extremities: No edema  Neurological: Alert oriented x 4, grossly non-focal Psychological:  Alert and cooperative. Normal mood and affect  ASSESSMENT AND PLAN: *LLQ abdominal pain and some mild  change in bowel habits over the past 1.5 weeks.  I suspect she has some mild diverticulitis.  Will treat empirically for now with ciprop 500 mg BID and flagyl 500 mg TID for 10 days.  She knows to go to the ED if symptoms worsen and she will call back here upon completion of antibiotics if little improvement/no resolution of symptoms at which point I would get a CT scan.   CC:  Deland Pretty, MD

## 2019-01-05 NOTE — Patient Instructions (Addendum)
We have sent the following medications to your pharmacy for you to pick up at your convenience: Cipro 500 mg twice a day x 10 days Flagyl 500 mg three times a day x 10 days   Please call the office if you have little or no improvement in your symptoms and ask for Koren Shiver, RN, BSN.

## 2019-01-07 NOTE — Progress Notes (Signed)
Reviewed and agree with documentation and assessment and plan. K. Veena Cameryn Chrisley , MD   

## 2019-01-11 DIAGNOSIS — H2511 Age-related nuclear cataract, right eye: Secondary | ICD-10-CM | POA: Diagnosis not present

## 2019-01-12 DIAGNOSIS — H2512 Age-related nuclear cataract, left eye: Secondary | ICD-10-CM | POA: Diagnosis not present

## 2019-01-18 ENCOUNTER — Telehealth: Payer: Self-pay | Admitting: Gastroenterology

## 2019-01-18 MED ORDER — FLUCONAZOLE 150 MG PO TABS: 150.0000 mg | ORAL_TABLET | Freq: Every day | ORAL | 0 refills | Status: AC

## 2019-01-18 NOTE — Telephone Encounter (Signed)
Was prescribed antibiotics on 01/05/19 for mild diverticulitis.  She was on a 10 day course and has not developed white "cottage cheese" vaginal itchy discharge.  [name] [name] is off until tomorrow.  Dr Silverio Decamp can you please review for treatment ?? diflucan

## 2019-01-18 NOTE — Telephone Encounter (Signed)
Prescription has been sent to the pharmacy and the pt has been advised.  She will call her GYN with any further issues.

## 2019-01-18 NOTE — Telephone Encounter (Signed)
Patient states APP Janett Billow put her on two antibiotics and now she thinks she has a yeast infection from them. Patient wanting to know if she can now get a prescription for her yeast infection called in to [facility].

## 2019-01-18 NOTE — Telephone Encounter (Signed)
Please send Rx for Fluconazole 150mg  X1 dose for possible vulvovaginal candidiasis. If continues to have issues, she should contact Gyn for further management. Thanks

## 2019-01-20 ENCOUNTER — Telehealth: Payer: Self-pay | Admitting: Gastroenterology

## 2019-01-20 DIAGNOSIS — R1084 Generalized abdominal pain: Secondary | ICD-10-CM

## 2019-01-20 NOTE — Telephone Encounter (Signed)
Pt called in and stated that her symptoms are not getting any better with the antibiotics that she is taking.She also reported that her bottom is red and raw. Pt is sched with [facility] 01/27/2019 @11am  and is on the waiting list and want to speak with nurse

## 2019-01-20 NOTE — Telephone Encounter (Signed)
The pt was advised of [name] [name]'s req's and agreed.  She also has agreed to CT scan.  I will get that set up and call pt on 1/23

## 2019-01-20 NOTE — Telephone Encounter (Signed)
Ok to order CT scan abdomen and pelvis with contrast.  Also, I agree, antibiotics likely causing loose stools.  Would recommend Desitin several times per day to her bottom/peri-anal area.  Thank you,  Jess

## 2019-01-20 NOTE — Telephone Encounter (Signed)
[name] [name] this pt was last seen on 01/05/19 for possible mild diverticulitis.  She was given cipro and flagyl.  She called on 1/20 with vaginal yeast symptoms and Dr [name] [name] prescribed diflucan.  The pt is calling today stating her abd pain is no better and she has diarrhea that has cause her "bottom to be red and raw"  She has an appt with you on 1/29.  Per your last assessment and plan you state CT scan may be next step.  Do you want me to get that ordered?  See your note below.   ASSESSMENT AND PLAN: *LLQ abdominal pain and some mild change in bowel habits over the past 1.5 weeks.  I suspect she has some mild diverticulitis.  Will treat empirically for now with ciprop 500 mg BID and flagyl 500 mg TID for 10 days.  She knows to go to the ED if symptoms worsen and she will call back here upon completion of antibiotics if little improvement/no resolution of symptoms at which point I would get a CT scan.

## 2019-01-21 NOTE — Telephone Encounter (Signed)
The patient has been notified of this information and all questions answered.   The pt has been advised of the information and verbalized understanding.    She will pick up instructions and contrast at the front desk.  She will have labs as well

## 2019-01-21 NOTE — Telephone Encounter (Signed)
Left message on machine to call back need to put contrast and instructions at the front desk after speaking with the pt.   Pt needs labs prior to CT scan  You have been scheduled for a CT scan of the abdomen and pelvis at [facility] ([address]---this is in the same building as Press [facility]).   You are scheduled on 02/02/19 at 62 am. You should arrive 15 minutes prior to your appointment time for registration. Please follow the written instructions below on the day of your exam:  WARNING: IF YOU ARE ALLERGIC TO IODINE/X-RAY DYE, PLEASE NOTIFY RADIOLOGY IMMEDIATELY AT [phone]! YOU WILL BE GIVEN A 13 HOUR PREMEDICATION PREP.  1) Do not eat or drink anything after 715 am (4 hours prior to your test) 2) You have been given 2 bottles of oral contrast to drink. The solution may taste better if refrigerated, but do NOT add ice or any other liquid to this solution. Shake well before drinking.   Drink 1 bottle of contrast @ 915 am (2 hours prior to your exam) Drink 1 bottle of contrast @ 1015 am (1 hour prior to your exam)  You may take any medications as prescribed with a small amount of water, if necessary. If you take any of the following medications: METFORMIN, GLUCOPHAGE, GLUCOVANCE, AVANDAMET, RIOMET, FORTAMET, [place] MET, JANUMET, GLUMETZA or METAGLIP, you MAY be asked to HOLD this medication 48 hours AFTER the exam.  The purpose of you drinking the oral contrast is to aid in the visualization of your intestinal tract. The contrast solution may cause some diarrhea. Depending on your individual set of symptoms, you may also receive an intravenous injection of x-ray contrast/dye. Plan on being at [facility] for 30 minutes or longer, depending on the type of exam you are having performed.  This test typically takes 30-45 minutes to complete.  If you have any questions regarding your exam or if you need to reschedule, you may call the CT department at  [phone] between the hours of 8:00 am and 5:00 pm, Monday-Friday.  ________________________________________

## 2019-01-27 ENCOUNTER — Encounter: Payer: Self-pay | Admitting: Gastroenterology

## 2019-01-27 ENCOUNTER — Ambulatory Visit (INDEPENDENT_AMBULATORY_CARE_PROVIDER_SITE_OTHER): Payer: Medicare Other | Admitting: Gastroenterology

## 2019-01-27 ENCOUNTER — Other Ambulatory Visit (INDEPENDENT_AMBULATORY_CARE_PROVIDER_SITE_OTHER): Payer: Medicare Other

## 2019-01-27 VITALS — BP 136/84 | HR 82 | Ht 68.75 in | Wt 223.6 lb

## 2019-01-27 DIAGNOSIS — R197 Diarrhea, unspecified: Secondary | ICD-10-CM

## 2019-01-27 DIAGNOSIS — R1084 Generalized abdominal pain: Secondary | ICD-10-CM

## 2019-01-27 LAB — CBC WITH DIFFERENTIAL/PLATELET
BASOS PCT: 0.8 % (ref 0.0–3.0)
Basophils Absolute: 0.1 10*3/uL (ref 0.0–0.1)
EOS PCT: 2.1 % (ref 0.0–5.0)
Eosinophils Absolute: 0.1 10*3/uL (ref 0.0–0.7)
HCT: 43.2 % (ref 36.0–46.0)
Hemoglobin: 14.3 g/dL (ref 12.0–15.0)
Lymphocytes Relative: 25.7 % (ref 12.0–46.0)
Lymphs Abs: 1.8 10*3/uL (ref 0.7–4.0)
MCHC: 33.2 g/dL (ref 30.0–36.0)
MCV: 91.2 fl (ref 78.0–100.0)
MONO ABS: 0.5 10*3/uL (ref 0.1–1.0)
Monocytes Relative: 7 % (ref 3.0–12.0)
NEUTROS PCT: 64.4 % (ref 43.0–77.0)
Neutro Abs: 4.4 10*3/uL (ref 1.4–7.7)
Platelets: 238 10*3/uL (ref 150.0–400.0)
RBC: 4.74 Mil/uL (ref 3.87–5.11)
RDW: 14.7 % (ref 11.5–15.5)
WBC: 6.9 10*3/uL (ref 4.0–10.5)

## 2019-01-27 LAB — BUN: BUN: 16 mg/dL (ref 6–23)

## 2019-01-27 LAB — CREATININE, SERUM: CREATININE: 1.04 mg/dL (ref 0.40–1.20)

## 2019-01-27 NOTE — Patient Instructions (Signed)
If you are age 68 or older, your body mass index should be between 23-30. Your Body mass index is 33.26 kg/m. If this is out of the aforementioned range listed, please consider follow up with your Primary Care Provider.  If you are age 17 or younger, your body mass index should be between 19-25. Your Body mass index is 33.26 kg/m. If this is out of the aformentioned range listed, please consider follow up with your Primary Care Provider.    Your provider has requested that you go to the basement level for lab work before leaving today. Press "B" on the elevator. The lab is located at the first door on the left as you exit the elevator.  Thank you for choosing me and [facility].  Janett Billow Zehr-PA

## 2019-01-27 NOTE — Progress Notes (Signed)
01/27/2019 [name] [name] [id number] 08/19/1951   HISTORY OF PRESENT ILLNESS: This is a 68 year old female who was seen by me in the office on January 7 with complaints of left lower quadrant abdominal pain.  She was treated with a course of Cipro and Flagyl for presumed diverticulitis.  There has been a few phone calls back and forth.  She had called complaining of yeast infection so was treated with Diflucan.  At some point she was also scheduled for CT scan due to complaints of ongoing pain.  At her visit earlier this month she reported some loose stools, but that seemed to get worse with the antibiotics, which is why she was made to come for this follow-up appointment.  She says that last week she was having much worse diarrhea but it seems to be better at this point.  She reports that her pain has actually completely resolved now as well.   Past Medical History:  Diagnosis Date  . Adenomatous colon polyp   . Allergy   . Anxiety   . Cancer ([place])    skin  . Cataract    bilateral  . COPD (chronic obstructive pulmonary disease) ([place])   . Depression   . Diverticulosis   . Endometriosis   . GERD (gastroesophageal reflux disease)   . Glaucoma   . Hiatal hernia   . IBS (irritable bowel syndrome)   . Inguinal hernia   . Pneumothorax    Past Surgical History:  Procedure Laterality Date  . ABDOMINAL HYSTERECTOMY    . PLEURAL SCARIFICATION    . ruptured disk    . torn tendon    . TUBAL LIGATION      reports that she has quit smoking. Her smoking use included cigarettes. She started smoking about 6 years ago. She has never used smokeless tobacco. She reports that she does not drink alcohol or use drugs. family history includes Colon polyps in her sister; Crohn's disease in her sister; Heart failure in her mother; Lung disease in her sister; Neuropathy in her sister; Osteoporosis in her sister; Thyroid cancer in her maternal grandmother; Thyroid disease in her sister. Allergies    Allergen Reactions  . Cefaclor   . Latex Other (See Comments)    blister  . Ofloxacin   . Sulfur     Thrush      Outpatient Encounter Medications as of 01/27/2019  Medication Sig  . ALPRAZolam (XANAX) 0.25 MG tablet   . Cholecalciferol (VITAMIN D PO) Take 3,000 Units by mouth.  . Difluprednate (DUREZOL) 0.05 % EMUL Apply to eye.  Marland Kitchen ibuprofen (ADVIL) 200 MG tablet Take 200 mg by mouth every 6 (six) hours as needed for pain.  Marland Kitchen ketorolac (ACULAR) 0.4 % SOLN 1 drop daily. 1 drop right eye 3 drops left eye  . moxifloxacin (VIGAMOX) 0.5 % ophthalmic solution Place 1 drop into both eyes 3 (three) times daily.  . promethazine (PHENERGAN) 25 MG tablet Take 25 mg by mouth every 6 (six) hours as needed for nausea or vomiting.  . ranitidine (ZANTAC) 150 MG capsule Take 150 mg by mouth as needed for heartburn.  . rosuvastatin (CRESTOR) 5 MG tablet 1 TABLET ONCE A DAY ORALLY 30 DAY(S)  . venlafaxine (EFFEXOR) 37.5 MG tablet Take 37.5 mg by mouth daily.  . [DISCONTINUED] ciprofloxacin (CIPRO) 500 MG tablet Take 1 tablet (500 mg total) by mouth 2 (two) times daily. (Patient not taking: Reported on 01/27/2019)  . [DISCONTINUED] metroNIDAZOLE (FLAGYL) 500 MG  tablet Take 1 tablet (500 mg total) by mouth 3 (three) times daily. (Patient not taking: Reported on 01/27/2019)   No facility-administered encounter medications on file as of 01/27/2019.      REVIEW OF SYSTEMS  : All other systems reviewed and negative except where noted in the History of Present Illness.   PHYSICAL EXAM: BP 136/84   Pulse 82   Ht 5' 8.75" (1.746 m)   Wt 223 lb 9.6 oz (101.4 kg)   SpO2 98%   BMI 33.26 kg/m  General: Well developed white female in no acute distress Head: Normocephalic and atraumatic Eyes:  Sclerae anicteric, conjunctiva pink. Ears: Normal auditory acuity Lungs: Clear throughout to auscultation; no increased WOB. Heart: Regular rate and rhythm; no M/R/G. Abdomen: Soft, non-distended.  BS present.   Non-tender. Musculoskeletal: Symmetrical with no gross deformities  Skin: No lesions on visible extremities Extremities: No edema  Neurological: Alert oriented x 4, grossly non-focal Psychological:  Alert and cooperative. Normal mood and affect  ASSESSMENT AND PLAN: *Diarrhea: The abdominal pain that she was seen here for on January 7 has resolved so suspect that it very well could have been diverticulitis.  She had some loose stools prior to the antibiotics, but worsened with treatment.  Got quite severe, but now improved again.  She had called here complaining of ongoing pain previously so CT scan was scheduled.  Not sure that she needs it at this point, but she would still like to proceed.  I offered stool studies, but she declined for now stating that seems to be getting better.  We will continue to monitor.  Will check CBC at her request.   CC:  Deland Pretty, MD

## 2019-01-28 ENCOUNTER — Encounter: Payer: Self-pay | Admitting: Gastroenterology

## 2019-02-01 DIAGNOSIS — H2512 Age-related nuclear cataract, left eye: Secondary | ICD-10-CM | POA: Diagnosis not present

## 2019-02-02 ENCOUNTER — Inpatient Hospital Stay: Admission: RE | Admit: 2019-02-02 | Payer: Medicare Other | Source: Ambulatory Visit

## 2019-02-03 ENCOUNTER — Inpatient Hospital Stay: Admission: RE | Admit: 2019-02-03 | Payer: Medicare Other | Source: Ambulatory Visit

## 2019-02-22 NOTE — Progress Notes (Signed)
Reviewed and agree with documentation and assessment and plan. K. Veena Sophina Mitten , MD   

## 2019-03-15 DIAGNOSIS — M25521 Pain in right elbow: Secondary | ICD-10-CM | POA: Diagnosis not present

## 2019-03-15 DIAGNOSIS — M7551 Bursitis of right shoulder: Secondary | ICD-10-CM | POA: Diagnosis not present

## 2019-03-16 DIAGNOSIS — M25521 Pain in right elbow: Secondary | ICD-10-CM | POA: Diagnosis not present

## 2019-04-15 DIAGNOSIS — E559 Vitamin D deficiency, unspecified: Secondary | ICD-10-CM | POA: Diagnosis not present

## 2019-04-15 DIAGNOSIS — E7800 Pure hypercholesterolemia, unspecified: Secondary | ICD-10-CM | POA: Diagnosis not present

## 2019-04-15 DIAGNOSIS — M8589 Other specified disorders of bone density and structure, multiple sites: Secondary | ICD-10-CM | POA: Diagnosis not present

## 2019-04-20 DIAGNOSIS — K219 Gastro-esophageal reflux disease without esophagitis: Secondary | ICD-10-CM | POA: Diagnosis not present

## 2019-04-20 DIAGNOSIS — M8580 Other specified disorders of bone density and structure, unspecified site: Secondary | ICD-10-CM | POA: Diagnosis not present

## 2019-04-20 DIAGNOSIS — M545 Low back pain: Secondary | ICD-10-CM | POA: Diagnosis not present

## 2019-04-20 DIAGNOSIS — E7800 Pure hypercholesterolemia, unspecified: Secondary | ICD-10-CM | POA: Diagnosis not present

## 2019-04-20 DIAGNOSIS — E559 Vitamin D deficiency, unspecified: Secondary | ICD-10-CM | POA: Diagnosis not present

## 2019-04-20 DIAGNOSIS — F339 Major depressive disorder, recurrent, unspecified: Secondary | ICD-10-CM | POA: Diagnosis not present

## 2019-04-20 DIAGNOSIS — E039 Hypothyroidism, unspecified: Secondary | ICD-10-CM | POA: Diagnosis not present

## 2019-04-20 DIAGNOSIS — F419 Anxiety disorder, unspecified: Secondary | ICD-10-CM | POA: Diagnosis not present

## 2019-04-20 DIAGNOSIS — L239 Allergic contact dermatitis, unspecified cause: Secondary | ICD-10-CM | POA: Diagnosis not present

## 2019-04-20 DIAGNOSIS — G2581 Restless legs syndrome: Secondary | ICD-10-CM | POA: Diagnosis not present

## 2019-06-07 DIAGNOSIS — M545 Low back pain: Secondary | ICD-10-CM | POA: Diagnosis not present

## 2019-06-08 DIAGNOSIS — M545 Low back pain: Secondary | ICD-10-CM | POA: Diagnosis not present

## 2019-06-14 DIAGNOSIS — S22080A Wedge compression fracture of T11-T12 vertebra, initial encounter for closed fracture: Secondary | ICD-10-CM | POA: Diagnosis not present

## 2019-06-18 DIAGNOSIS — R5383 Other fatigue: Secondary | ICD-10-CM | POA: Diagnosis not present

## 2019-06-18 DIAGNOSIS — E559 Vitamin D deficiency, unspecified: Secondary | ICD-10-CM | POA: Diagnosis not present

## 2019-06-18 DIAGNOSIS — M810 Age-related osteoporosis without current pathological fracture: Secondary | ICD-10-CM | POA: Diagnosis not present

## 2019-06-24 DIAGNOSIS — S22080D Wedge compression fracture of T11-T12 vertebra, subsequent encounter for fracture with routine healing: Secondary | ICD-10-CM | POA: Diagnosis not present

## 2019-06-28 DIAGNOSIS — S22080D Wedge compression fracture of T11-T12 vertebra, subsequent encounter for fracture with routine healing: Secondary | ICD-10-CM | POA: Diagnosis not present

## 2019-07-27 DIAGNOSIS — B029 Zoster without complications: Secondary | ICD-10-CM | POA: Diagnosis not present

## 2019-07-27 DIAGNOSIS — S22080D Wedge compression fracture of T11-T12 vertebra, subsequent encounter for fracture with routine healing: Secondary | ICD-10-CM | POA: Diagnosis not present

## 2019-09-22 DIAGNOSIS — R05 Cough: Secondary | ICD-10-CM | POA: Diagnosis not present

## 2019-09-22 DIAGNOSIS — S22080G Wedge compression fracture of T11-T12 vertebra, subsequent encounter for fracture with delayed healing: Secondary | ICD-10-CM | POA: Diagnosis not present

## 2019-09-22 DIAGNOSIS — E7800 Pure hypercholesterolemia, unspecified: Secondary | ICD-10-CM | POA: Diagnosis not present

## 2019-09-22 DIAGNOSIS — R0989 Other specified symptoms and signs involving the circulatory and respiratory systems: Secondary | ICD-10-CM | POA: Diagnosis not present

## 2019-09-23 ENCOUNTER — Other Ambulatory Visit (HOSPITAL_COMMUNITY): Payer: Self-pay | Admitting: Internal Medicine

## 2019-09-23 ENCOUNTER — Other Ambulatory Visit: Payer: Self-pay | Admitting: Internal Medicine

## 2019-09-23 DIAGNOSIS — R059 Cough, unspecified: Secondary | ICD-10-CM

## 2019-09-23 DIAGNOSIS — S22080G Wedge compression fracture of T11-T12 vertebra, subsequent encounter for fracture with delayed healing: Secondary | ICD-10-CM

## 2019-09-23 DIAGNOSIS — R05 Cough: Secondary | ICD-10-CM

## 2019-09-29 DIAGNOSIS — R300 Dysuria: Secondary | ICD-10-CM | POA: Diagnosis not present

## 2019-09-29 DIAGNOSIS — E875 Hyperkalemia: Secondary | ICD-10-CM | POA: Diagnosis not present

## 2019-10-06 ENCOUNTER — Encounter (HOSPITAL_COMMUNITY)
Admission: RE | Admit: 2019-10-06 | Discharge: 2019-10-06 | Disposition: A | Discharge status: Home / Self Care | Payer: Medicare Other | Source: Ambulatory Visit | Attending: Internal Medicine | Admitting: Internal Medicine

## 2019-10-06 ENCOUNTER — Other Ambulatory Visit: Payer: Self-pay

## 2019-10-06 ENCOUNTER — Ambulatory Visit (HOSPITAL_COMMUNITY)
Admission: RE | Admit: 2019-10-06 | Discharge: 2019-10-06 | Disposition: A | Discharge status: Home / Self Care | Payer: Medicare Other | Source: Ambulatory Visit | Attending: Internal Medicine | Admitting: Internal Medicine

## 2019-10-06 ENCOUNTER — Encounter (HOSPITAL_COMMUNITY): Payer: Self-pay

## 2019-10-06 DIAGNOSIS — S22080G Wedge compression fracture of T11-T12 vertebra, subsequent encounter for fracture with delayed healing: Secondary | ICD-10-CM | POA: Diagnosis not present

## 2019-10-06 DIAGNOSIS — R911 Solitary pulmonary nodule: Secondary | ICD-10-CM | POA: Diagnosis not present

## 2019-10-06 DIAGNOSIS — R059 Cough, unspecified: Secondary | ICD-10-CM

## 2019-10-06 DIAGNOSIS — R05 Cough: Secondary | ICD-10-CM

## 2019-10-06 MED ORDER — TECHNETIUM TC 99M MEDRONATE IV KIT
20.0000 | PACK | Freq: Once | INTRAVENOUS | Status: AC | PRN
  Administered 2019-10-06: 20 via INTRAVENOUS

## 2019-10-06 MED ORDER — FLUDEOXYGLUCOSE F - 18 (FDG) INJECTION: 20.3000 | Freq: Once | INTRAVENOUS | Status: DC | PRN

## 2019-10-13 DIAGNOSIS — S22080A Wedge compression fracture of T11-T12 vertebra, initial encounter for closed fracture: Secondary | ICD-10-CM | POA: Diagnosis not present

## 2019-10-13 DIAGNOSIS — M545 Low back pain: Secondary | ICD-10-CM | POA: Diagnosis not present

## 2019-10-13 DIAGNOSIS — M25552 Pain in left hip: Secondary | ICD-10-CM | POA: Diagnosis not present

## 2019-10-21 DIAGNOSIS — J3089 Other allergic rhinitis: Secondary | ICD-10-CM | POA: Diagnosis not present

## 2019-10-21 DIAGNOSIS — R911 Solitary pulmonary nodule: Secondary | ICD-10-CM | POA: Diagnosis not present

## 2019-10-27 DIAGNOSIS — H04123 Dry eye syndrome of bilateral lacrimal glands: Secondary | ICD-10-CM | POA: Diagnosis not present

## 2019-11-02 ENCOUNTER — Other Ambulatory Visit: Payer: Self-pay | Admitting: Internal Medicine

## 2019-11-02 DIAGNOSIS — R911 Solitary pulmonary nodule: Secondary | ICD-10-CM

## 2019-11-10 DIAGNOSIS — H04123 Dry eye syndrome of bilateral lacrimal glands: Secondary | ICD-10-CM | POA: Diagnosis not present

## 2019-11-16 ENCOUNTER — Telehealth: Payer: Self-pay | Admitting: Internal Medicine

## 2019-11-16 ENCOUNTER — Other Ambulatory Visit: Payer: Self-pay

## 2019-11-16 ENCOUNTER — Encounter: Payer: Self-pay | Admitting: Internal Medicine

## 2019-11-16 ENCOUNTER — Ambulatory Visit (INDEPENDENT_AMBULATORY_CARE_PROVIDER_SITE_OTHER): Payer: Medicare Other | Admitting: Internal Medicine

## 2019-11-16 VITALS — BP 142/80 | HR 71 | Temp 97.3°F | Ht 69.0 in | Wt 234.4 lb

## 2019-11-16 DIAGNOSIS — J310 Chronic rhinitis: Secondary | ICD-10-CM | POA: Diagnosis not present

## 2019-11-16 DIAGNOSIS — R918 Other nonspecific abnormal finding of lung field: Secondary | ICD-10-CM

## 2019-11-16 DIAGNOSIS — R911 Solitary pulmonary nodule: Secondary | ICD-10-CM | POA: Diagnosis not present

## 2019-11-16 NOTE — Telephone Encounter (Signed)
Attempted to call patient back, voicemail is full.   Will route message to Dr. Shearon Stalls as Juluis Rainier.

## 2019-11-16 NOTE — Telephone Encounter (Signed)
atrovent nasal spray has been added to patient's med list.

## 2019-11-16 NOTE — Patient Instructions (Signed)
Flonase - 1 spray on each side of your nose twice a day for first week, then 1 spray on each side.   Instructions for use:  If you also use a saline nasal spray or rinse, use that first.  Position the head with the chin slightly tucked. Use the right hand to spray into the left nostril and the right hand to spray into the left nostril.   Point the bottle away from the septum of your nose (cartilage that divides the two sides of your nose).   Hold the nostril closed on the opposite side from where you will spray  Spray once and gently sniff to pull the medicine into the higher parts of your nose.  Don't sniff too hard as the medicine will drain down the back of your throat instead.  Repeat with a second spray on the same side if prescribed.  Repeat on the other side of your nose.

## 2019-11-16 NOTE — Telephone Encounter (Signed)
Thank you. Please let her know it is ok to continue this spray with the same technique we reviewed for the flonase.

## 2019-11-16 NOTE — Progress Notes (Addendum)
[name] [name]    [id number]    August 21, 1951  Primary Care Physician:[name], [name] Jew, MD  Referring Physician: Deland Pretty, MD [address] [place],  [place] [address] Reason for Consultation: cough, nodule Date of Consultation: 11/16/2019  Chief complaint:   Chief Complaint  Patient presents with  . Consult    Referred by PCP for cough, lung nodule.  CT 10/06/2019 in Epic. pt's primary concern is PND, sinus congestion.      HPI:  Has had years of rhinorrhea which is worse at night time. She does have occasional migraine headaches but does have pressure behind her eyes as well as dry eyes (related to cataract surgery.)    She does have reflux and takes ranitidine as needed.  The drainage is year round and she does not attribute it to any particular triggers. No known seasonal allergies.  She does not endorse much of cough with these symptoms.   She has never tried any over the counter medications or herbal remedies for this drainage.  In the morning when she clears her throat the mucus is yellow, and the rest of the day it's clear. She blows her nose most after eating and with activity.  With rest the drainage is better.    She denies any daily dyspnea.  She gets bronchitis frequently - although hasn't had an episode in the last couple of years.   Does have a history of spontaneous pneumothorax in the 1980s.  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  Smoking history:  Quit 02/10/2012 - she quit the day her husband had a stent placed. 1.5 ppd x 40 years = 60 pack years Lifelong resident of: [place] at home with her husband.   Relevant family history: She has a history with COPD Family History  Problem Relation Age of Onset  . Heart failure Mother   . Thyroid cancer Maternal Grandmother   . Osteoporosis Sister   . Crohn's disease Sister   . Neuropathy Sister   . Thyroid disease  Sister   . Lung disease Sister   . Colon polyps Sister   . Colon cancer Neg Hx     Past Medical History:  Diagnosis Date  . Adenomatous colon polyp   . Allergy   . Anxiety   . Cancer ([place])    skin  . Cataract    bilateral  . COPD (chronic obstructive pulmonary disease) ([place])   . Depression   . Diverticulosis   . Endometriosis   . GERD (gastroesophageal reflux disease)   . Glaucoma   . Hiatal hernia   . IBS (irritable bowel syndrome)   . Inguinal hernia   . Pneumothorax     Past Surgical History:  Procedure Laterality Date  . ABDOMINAL HYSTERECTOMY    . PLEURAL SCARIFICATION    . ruptured disk    . torn tendon    . TUBAL LIGATION      Review of systems: Constitutional: Negative for fever and chills, sweats, and weight loss HENT: Positive for rhinitis.   Eyes: Negative for blurred vision.  Positive for dry eyes Respiratory: , no dyspnea Cardiovascular: Negative for chest pain and palpitations.  Gastrointestinal: Negative for vomiting, diarrhea, blood per rectum. Genitourinary: Negative for dysuria, urgency, frequency and hematuria.  Musculoskeletal: Negative for myalgias, back pain and joint pain.  Skin: Negative for itching and rash.  Neurological: Negative for dizziness,  tremors, focal weakness, seizures and loss of consciousness.  Endo/Heme/Allergies: Negative for environmental allergies.  Psychiatric/Behavioral: Negative for depression, suicidal ideas and hallucinations.  All other systems reviewed and are negative.  Physical Exam: Blood pressure (!) 142/80, pulse 71, temperature (!) 97.3 F (36.3 C), temperature source Temporal, height 5\' 9"  (1.753 m), weight 234 lb 6.4 oz (106.3 kg), SpO2 97 %. Gen:      No acute distress Eyes: EOMI, sclera anicteric ENT: No nasal polyps, significant nasal debris, there is major cobblestoning in the oropharynx Neck:     Supple, no thyromegaly Lungs:    No increased respiratory effort, symmetric chest wall excursion, clear  to auscultation bilaterally, no wheezes or crackles CV:         Regular rate and rhythm; no murmurs, rubs, or gallops.  No pedal edema Abd:      + bowel sounds; soft, non-tender, no distension MSK: no acute synovitis of DIP or PIP joints, no mechanics hands.  Skin:      Warm and dry; no rashes Neuro: normal speech, no focal facial asymmetry Psych: alert and oriented x3, normal mood and affect  Data Reviewed: Imaging: I have personally reviewed the CT chest performed October 06, 2019 which demonstrates right middle lobe 6 mm pulmonary nodule  PFTs: No documented PFTs.   Labs:  Immunization status: Immunization History  Administered Date(s) Administered  . Influenza, High Dose Seasonal PF 09/16/2019  . Td 12/26/2014  . Zoster Recombinat (Shingrix) 09/22/2019   up to date and documented.  Assessment:  [name] [name] is a 68 year old woman with a previous history of tobacco use who presents for new patient referral for chronic cough and solitary pulmonary nodule.  1.  Chronic Rhinitis 2.  Solitary pulmonary nodule  Plan/Recommendations: [name] [name] chronic rhinitis may or may not be related to an allergic phenotype.  However a trial of fluticasone nasal spray is appropriate.  She will start this today.  She was also going to start a pill given to her by [name] [name] but she does not remember the name of it.  Regarding her solitary pulmonary nodule it is 6 mm and she is considered high risk given her smoking history.  At this point we would do a CT scan in 12 months which would be October 2021.  I have ordered this.  She will follow up with me in 1 years time.  Fleischner Society Guidelines 2017 MacMahon H, Naidich DP, Goo JM, et al. Guidelines for management of incidental pulmonary nodules detected on CT Images: From the Fleischner Society. Radiology 2017; [id number]. Copyright  2017 Radiological Society of [place].  Evaluation of the incidental solid pulmonary nodule in adults Nodule  size (mm) Low (<5%) cancer risk High (>65%) or moderate (5 to 65%) cancer risk  Solitary  <6 No routine follow-up Optional CT at 12 months  6 to 8 CT at 6 to 12 months, then consider CT at 18 to 24 months CT at 6 to 12 months, then CT at 18 to 24 months  >8 CT at 3 months, then at 9 and 24 months FDG PET/CT, biopsy or resection  Multiple (evaluation based on largest nodule)  <6 No routine follow-up Optional CT at 12 months  ?6 CT at 3 to 6 months, then consider CT at 18 to 24 months CT at 3 to 6 months, then CT at 18 to 24 months    Not applicable to patients age <35 years, in lung cancer screening, with immunosuppression, known pulmonary  disease or symptoms or active primary cancer.  Chest CT performed without contrast as contiguous 1 mm sections using low dose technique.  Growing or FDG-avid nodules should undergo biopsy or resection. Growth is defined as >1.5 mm increase.  Nodules unchanged for >2 years are benign.   CT: computed tomography; FDG: 18-fluorodeoxyglucose; PET: positron emission tomography.   Return to Care: Return in about 1 year (around 11/15/2020).  Lenice Llamas, MD [facility] [place]  CC: [name] Pretty, MD

## 2019-11-17 NOTE — Telephone Encounter (Signed)
Spoke with pt. She is aware of Dr. Mauricio Po response. Nothing further was needed.

## 2019-11-30 IMAGING — NM NM BONE WHOLE BODY
2 series · 2 of 2 positions shown · non-contrast
Comparison: None

Correlation: CT chest 10/06/2019

CLINICAL DATA: Compression fracture T12 with delayed healing
subsequent encounter

EXAM:
NUCLEAR MEDICINE WHOLE BODY BONE SCAN
TECHNIQUE: Whole body anterior and posterior images were obtained approximately
3 hours after intravenous injection of radiopharmaceutical.
RADIOPHARMACEUTICALS:  20.3 mCi 7echnetium-PPm MDP IV

[Series 1: wbr_bone_40 whole body · 2.66mm/px · 1 of 1 slices shown (1 of 2)]
[im 1/1]
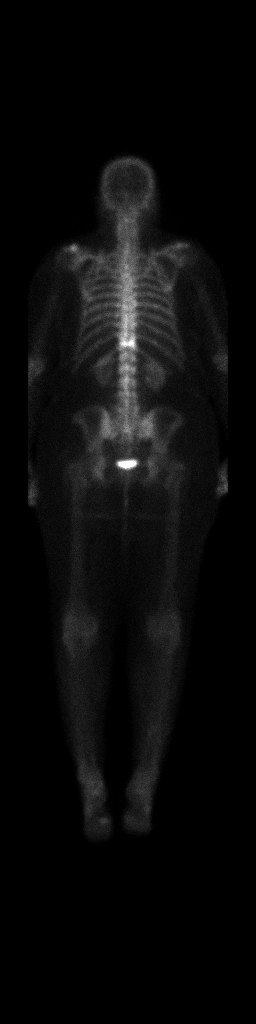

[Series 1: wbr_bone_40 whole body · 2.66mm/px · 1 of 1 slices shown (2 of 2)]
[im 1/1]
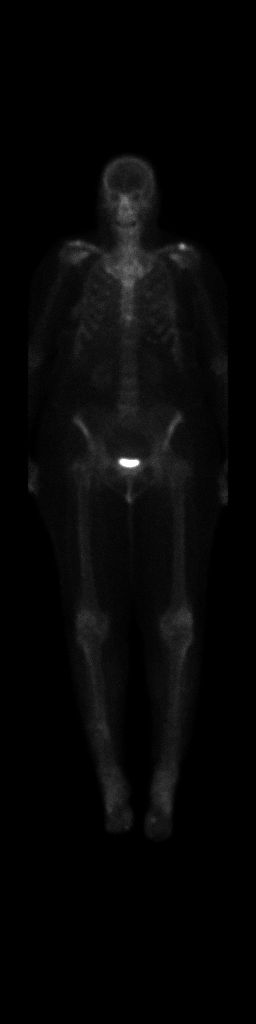

[2 of 2 positions shown; findings below may reference images not displayed]

FINDINGS: Increased uptake identified at superior endplate of T12 consistent
with healing compression fracture.

Uptake at both shoulders, typically degenerative.

Minimal nonspecific tracer accumulation at the tip of the sternum at
chondrosternal junction on LEFT, nonspecific.

No additional sites of abnormal osseous tracer accumulation are
identified.

Expected urinary tract and soft tissue distribution of tracer.
IMPRESSION: Increased tracer uptake at superior T12 vertebral body consistent
with healing compression fracture as identified on CT.

## 2019-11-30 IMAGING — CT CT CHEST W/O CM
2 of 4 series · 15 of 36 positions shown, 18 images · non-contrast
Comparison: Chest radiograph dated 09/22/2019.

CLINICAL DATA: Pulmonary nodule seen on a prior chest radiograph.
Patient has a history of smoking.

EXAM:
CT CHEST WITHOUT CONTRAST
TECHNIQUE: Multidetector CT imaging of the chest was performed following the
standard protocol without IV contrast.

[Series 2: thorax · axial · 0.66mm/px · z∈[-306,-18]mm · 12 of 169 slices shown, 15 images]
[im 13/169  mediastinal]
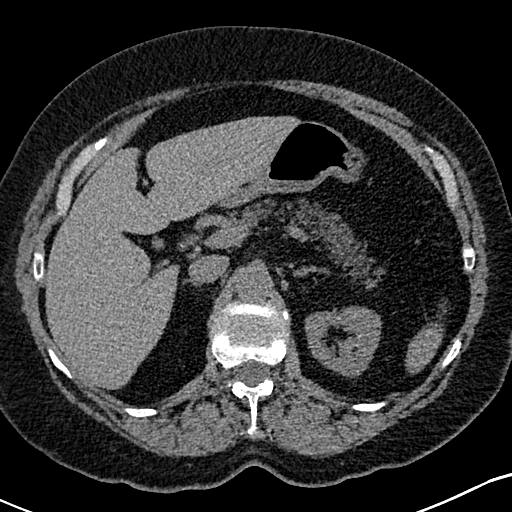
[im 13/169  lung]
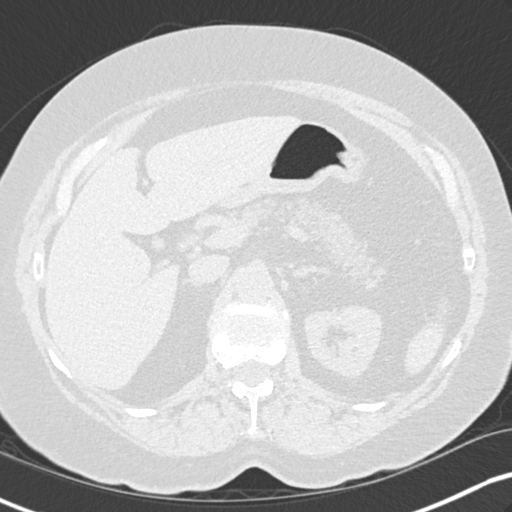
[im 25/169  lung]
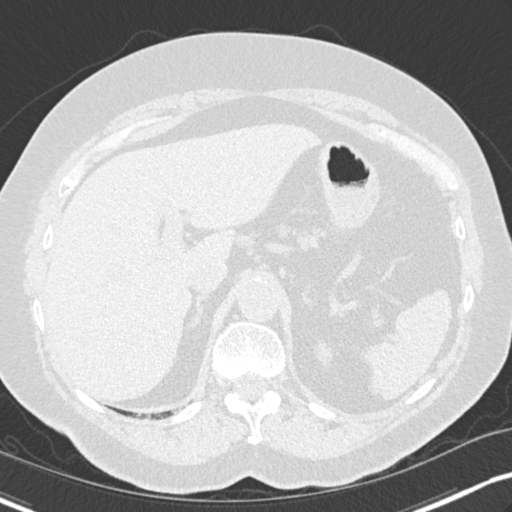
[im 37/169  lung]
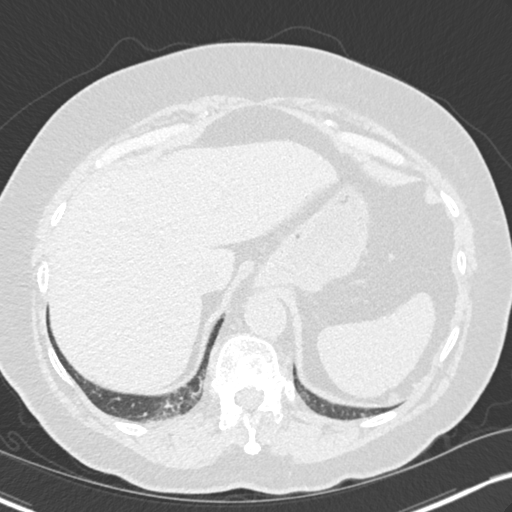
[im 49/169  lung]
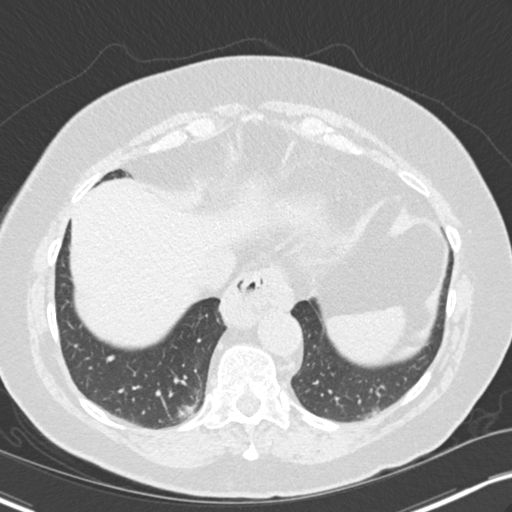
[im 61/169  mediastinal]
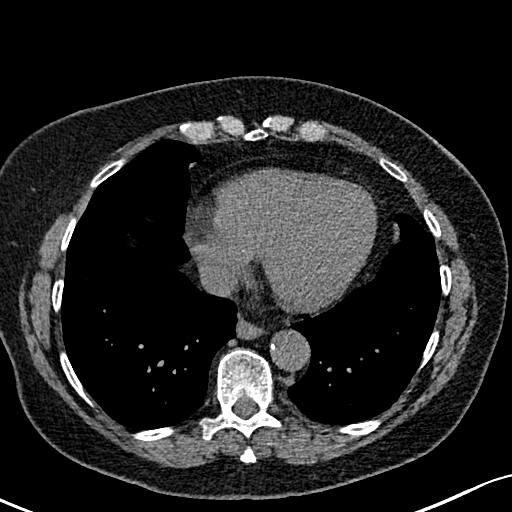
[im 61/169  lung]
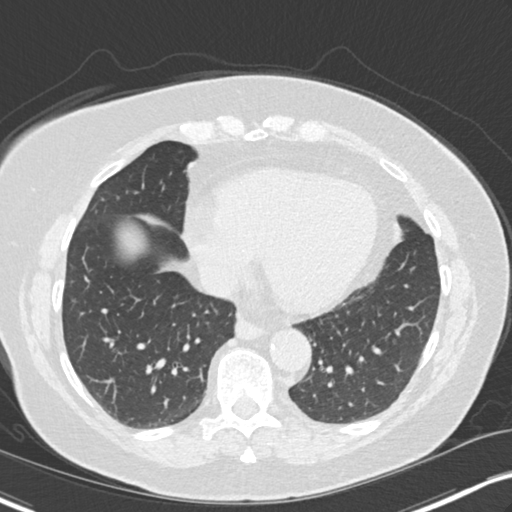
[im 73/169  lung]
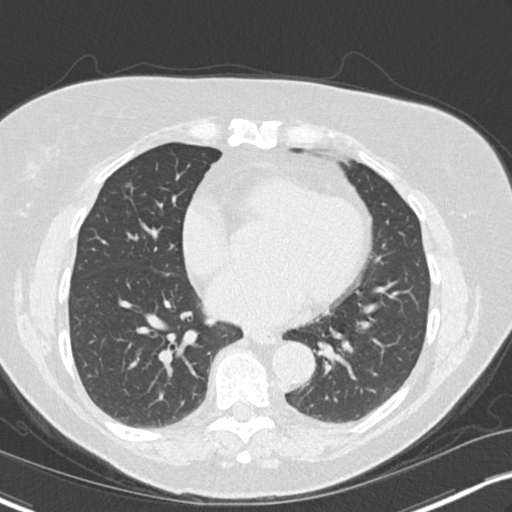
[im 97/169  lung]
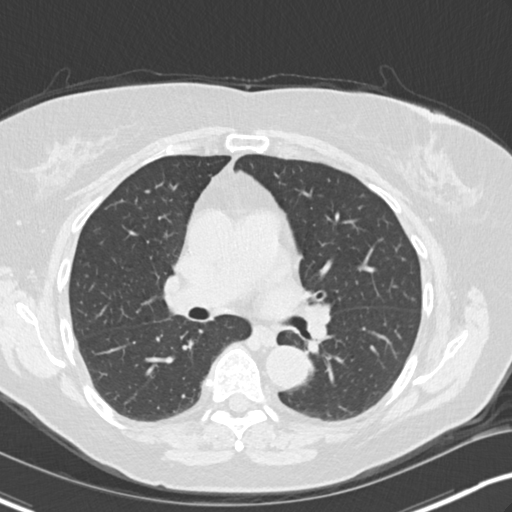
[im 109/169  lung]
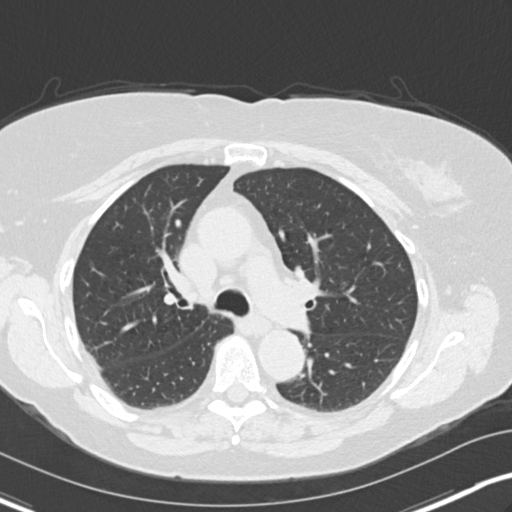
[im 121/169  mediastinal]
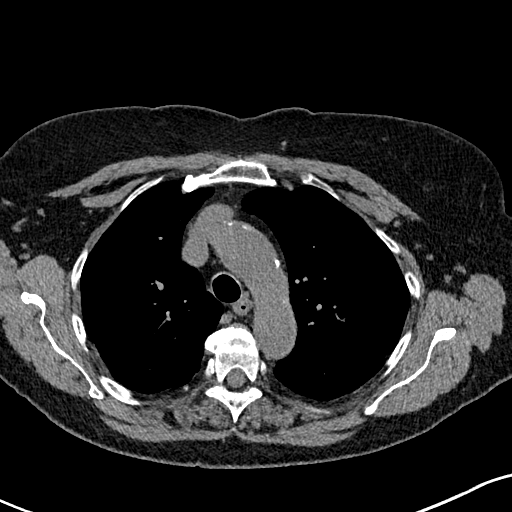
[im 121/169  lung]
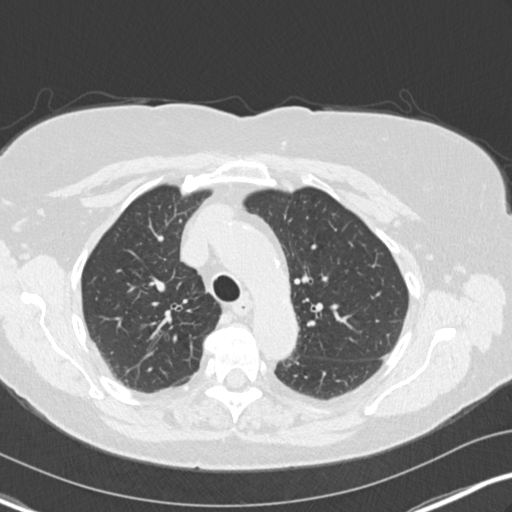
[im 133/169  lung]
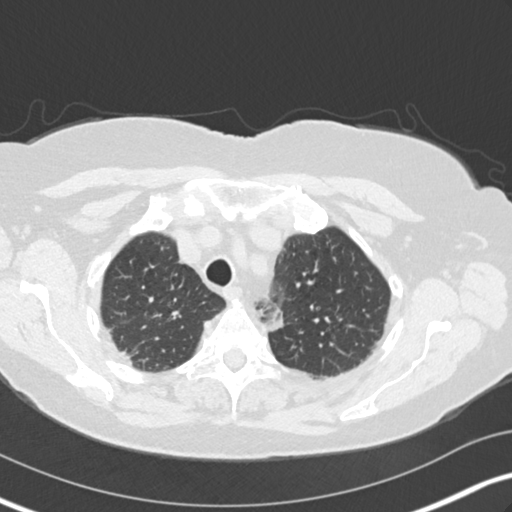
[im 145/169  lung]
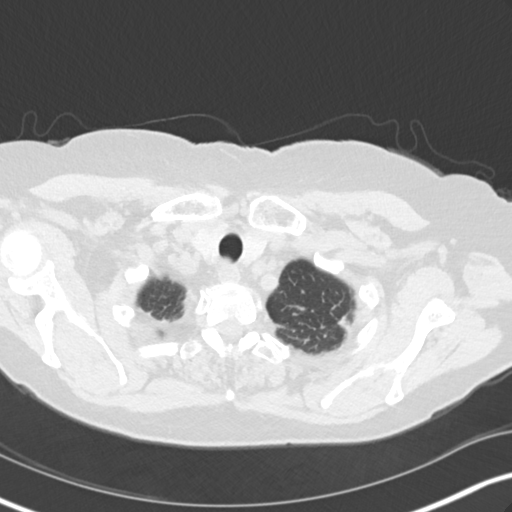
[im 157/169  lung]
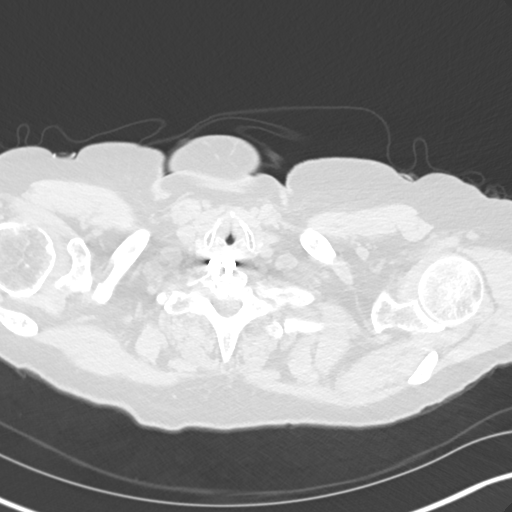

[Series 5: coronal · coronal · 0.68mm/px · 3 of 140 slices shown]
[im 28/140  lung]
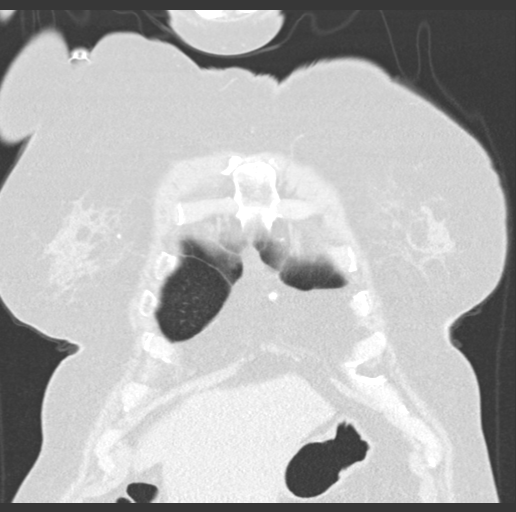
[im 56/140  lung]
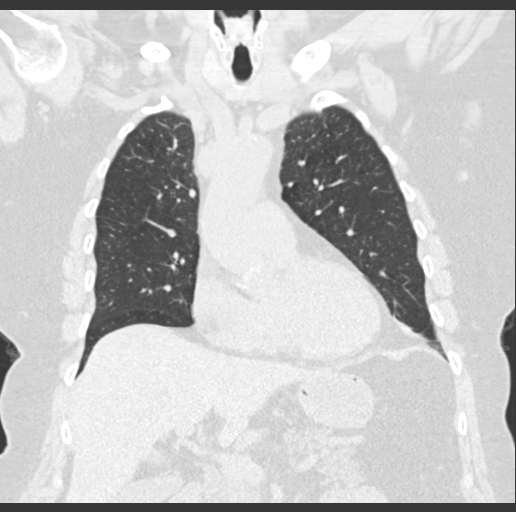
[im 84/140  lung]
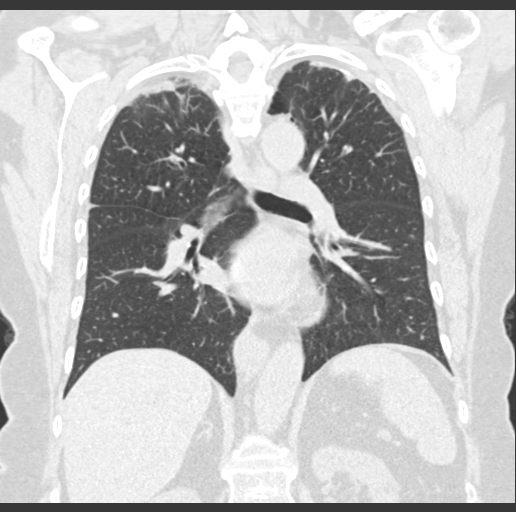

[15 of 36 positions shown; findings below may reference images not displayed]

FINDINGS: Cardiovascular: Vascular calcifications are seen in the aortic arch
and abdominal aorta. Normal heart size. No pericardial effusion.

Mediastinum/Nodes: No enlarged mediastinal or axillary lymph nodes.
Thyroid gland, trachea, and esophagus demonstrate no significant
findings.

Lungs/Pleura: A 6 mm solid pulmonary nodule in the right middle lobe
is indeterminate. There is biapical pleuroparenchymal scarring,
right greater than left. There is mild bilateral dependent
atelectasis. There is no pneumothorax or pleural effusion.

Upper Abdomen: There is a small hiatal hernia.

Musculoskeletal: Cervical spinal fusion hardware is noted.
IMPRESSION: Solid pulmonary nodule in the right middle lobe measuring 6 mm.
Non-contrast chest CT at 6-12 months is recommended. If the nodule
is stable at time of repeat CT, then future CT at 18-24 months (from
today's scan) is considered optional for low-risk patients, but is
recommended for high-risk patients. This recommendation follows the
consensus statement: Guidelines for Management of Incidental
Pulmonary Nodules Detected on CT Images: From the [HOSPITAL]

Aortic Atherosclerosis (O27AC-YLA.A).

## 2020-02-13 ENCOUNTER — Ambulatory Visit: Payer: Medicare Other

## 2020-04-07 DIAGNOSIS — L02215 Cutaneous abscess of perineum: Secondary | ICD-10-CM | POA: Diagnosis not present

## 2020-04-13 ENCOUNTER — Other Ambulatory Visit: Payer: Self-pay | Admitting: Internal Medicine

## 2020-04-13 DIAGNOSIS — Z1231 Encounter for screening mammogram for malignant neoplasm of breast: Secondary | ICD-10-CM

## 2020-04-19 ENCOUNTER — Ambulatory Visit
Admission: RE | Admit: 2020-04-19 | Discharge: 2020-04-19 | Disposition: A | Discharge status: Home / Self Care | Payer: Medicare Other | Source: Ambulatory Visit | Attending: Internal Medicine | Admitting: Internal Medicine

## 2020-04-19 ENCOUNTER — Other Ambulatory Visit: Payer: Self-pay

## 2020-04-19 DIAGNOSIS — Z1231 Encounter for screening mammogram for malignant neoplasm of breast: Secondary | ICD-10-CM | POA: Diagnosis not present

## 2020-04-19 DIAGNOSIS — E559 Vitamin D deficiency, unspecified: Secondary | ICD-10-CM | POA: Diagnosis not present

## 2020-04-19 DIAGNOSIS — E7800 Pure hypercholesterolemia, unspecified: Secondary | ICD-10-CM | POA: Diagnosis not present

## 2020-04-25 DIAGNOSIS — L749 Eccrine sweat disorder, unspecified: Secondary | ICD-10-CM | POA: Diagnosis not present

## 2020-04-25 DIAGNOSIS — E039 Hypothyroidism, unspecified: Secondary | ICD-10-CM | POA: Diagnosis not present

## 2020-04-25 DIAGNOSIS — F339 Major depressive disorder, recurrent, unspecified: Secondary | ICD-10-CM | POA: Diagnosis not present

## 2020-04-25 DIAGNOSIS — E7800 Pure hypercholesterolemia, unspecified: Secondary | ICD-10-CM | POA: Diagnosis not present

## 2020-04-25 DIAGNOSIS — F411 Generalized anxiety disorder: Secondary | ICD-10-CM | POA: Diagnosis not present

## 2020-04-25 DIAGNOSIS — J3089 Other allergic rhinitis: Secondary | ICD-10-CM | POA: Diagnosis not present

## 2020-04-25 DIAGNOSIS — R3129 Other microscopic hematuria: Secondary | ICD-10-CM | POA: Diagnosis not present

## 2020-04-25 DIAGNOSIS — Z86010 Personal history of colonic polyps: Secondary | ICD-10-CM | POA: Diagnosis not present

## 2020-04-25 DIAGNOSIS — Z0000 Encounter for general adult medical examination without abnormal findings: Secondary | ICD-10-CM | POA: Diagnosis not present

## 2020-04-25 DIAGNOSIS — Z8781 Personal history of (healed) traumatic fracture: Secondary | ICD-10-CM | POA: Diagnosis not present

## 2020-04-25 DIAGNOSIS — K580 Irritable bowel syndrome with diarrhea: Secondary | ICD-10-CM | POA: Diagnosis not present

## 2020-04-25 DIAGNOSIS — G43009 Migraine without aura, not intractable, without status migrainosus: Secondary | ICD-10-CM | POA: Diagnosis not present

## 2020-05-02 DIAGNOSIS — M1712 Unilateral primary osteoarthritis, left knee: Secondary | ICD-10-CM | POA: Diagnosis not present

## 2020-05-09 DIAGNOSIS — I2510 Atherosclerotic heart disease of native coronary artery without angina pectoris: Secondary | ICD-10-CM | POA: Diagnosis not present

## 2020-05-09 DIAGNOSIS — F411 Generalized anxiety disorder: Secondary | ICD-10-CM | POA: Diagnosis not present

## 2020-05-23 DIAGNOSIS — S83242A Other tear of medial meniscus, current injury, left knee, initial encounter: Secondary | ICD-10-CM | POA: Diagnosis not present

## 2020-05-23 DIAGNOSIS — M1712 Unilateral primary osteoarthritis, left knee: Secondary | ICD-10-CM | POA: Diagnosis not present

## 2020-05-27 DIAGNOSIS — M25562 Pain in left knee: Secondary | ICD-10-CM | POA: Diagnosis not present

## 2020-07-10 DIAGNOSIS — I2510 Atherosclerotic heart disease of native coronary artery without angina pectoris: Secondary | ICD-10-CM | POA: Diagnosis not present

## 2020-07-12 DIAGNOSIS — E7800 Pure hypercholesterolemia, unspecified: Secondary | ICD-10-CM | POA: Diagnosis not present

## 2020-08-22 DIAGNOSIS — X32XXXA Exposure to sunlight, initial encounter: Secondary | ICD-10-CM | POA: Diagnosis not present

## 2020-08-22 DIAGNOSIS — D225 Melanocytic nevi of trunk: Secondary | ICD-10-CM | POA: Diagnosis not present

## 2020-08-22 DIAGNOSIS — C44319 Basal cell carcinoma of skin of other parts of face: Secondary | ICD-10-CM | POA: Diagnosis not present

## 2020-08-22 DIAGNOSIS — L570 Actinic keratosis: Secondary | ICD-10-CM | POA: Diagnosis not present

## 2020-09-12 DIAGNOSIS — S0502XA Injury of conjunctiva and corneal abrasion without foreign body, left eye, initial encounter: Secondary | ICD-10-CM | POA: Diagnosis not present

## 2020-09-15 DIAGNOSIS — Z23 Encounter for immunization: Secondary | ICD-10-CM | POA: Diagnosis not present

## 2020-10-03 DIAGNOSIS — L570 Actinic keratosis: Secondary | ICD-10-CM | POA: Diagnosis not present

## 2020-10-03 DIAGNOSIS — Z85828 Personal history of other malignant neoplasm of skin: Secondary | ICD-10-CM | POA: Diagnosis not present

## 2020-10-03 DIAGNOSIS — X32XXXD Exposure to sunlight, subsequent encounter: Secondary | ICD-10-CM | POA: Diagnosis not present

## 2020-10-03 DIAGNOSIS — Z08 Encounter for follow-up examination after completed treatment for malignant neoplasm: Secondary | ICD-10-CM | POA: Diagnosis not present

## 2020-10-25 ENCOUNTER — Ambulatory Visit (HOSPITAL_COMMUNITY): Payer: Medicare Other

## 2020-10-27 ENCOUNTER — Other Ambulatory Visit: Payer: Self-pay

## 2020-10-27 ENCOUNTER — Encounter (HOSPITAL_COMMUNITY): Payer: Self-pay

## 2020-10-27 ENCOUNTER — Ambulatory Visit (HOSPITAL_COMMUNITY)
Admission: RE | Admit: 2020-10-27 | Discharge: 2020-10-27 | Disposition: A | Discharge status: Home / Self Care | Payer: Medicare Other | Source: Ambulatory Visit | Attending: Internal Medicine | Admitting: Internal Medicine

## 2020-10-27 DIAGNOSIS — R918 Other nonspecific abnormal finding of lung field: Secondary | ICD-10-CM | POA: Insufficient documentation

## 2020-10-27 DIAGNOSIS — I2510 Atherosclerotic heart disease of native coronary artery without angina pectoris: Secondary | ICD-10-CM | POA: Diagnosis not present

## 2020-10-27 DIAGNOSIS — J984 Other disorders of lung: Secondary | ICD-10-CM | POA: Diagnosis not present

## 2020-10-27 DIAGNOSIS — K449 Diaphragmatic hernia without obstruction or gangrene: Secondary | ICD-10-CM | POA: Diagnosis not present

## 2020-10-27 DIAGNOSIS — R0600 Dyspnea, unspecified: Secondary | ICD-10-CM | POA: Diagnosis not present

## 2020-11-16 ENCOUNTER — Ambulatory Visit (INDEPENDENT_AMBULATORY_CARE_PROVIDER_SITE_OTHER): Payer: Medicare Other | Admitting: Internal Medicine

## 2020-11-16 ENCOUNTER — Encounter: Payer: Self-pay | Admitting: Internal Medicine

## 2020-11-16 ENCOUNTER — Other Ambulatory Visit: Payer: Self-pay

## 2020-11-16 VITALS — BP 126/78 | HR 68 | Temp 98.1°F | Ht 69.0 in | Wt 221.0 lb

## 2020-11-16 DIAGNOSIS — J310 Chronic rhinitis: Secondary | ICD-10-CM

## 2020-11-16 DIAGNOSIS — Z87891 Personal history of nicotine dependence: Secondary | ICD-10-CM

## 2020-11-16 DIAGNOSIS — R918 Other nonspecific abnormal finding of lung field: Secondary | ICD-10-CM

## 2020-11-16 DIAGNOSIS — R0602 Shortness of breath: Secondary | ICD-10-CM

## 2020-11-16 MED ORDER — CETIRIZINE HCL 10 MG PO TABS: 10.0000 mg | ORAL_TABLET | Freq: Every day | ORAL | 5 refills | Status: DC

## 2020-11-16 NOTE — Patient Instructions (Signed)
The patient should have follow up scheduled in one year.   Prior to next visit patient should have: CT Chest.   Can try taking cetirizine once a day for drainage.

## 2020-11-16 NOTE — Progress Notes (Signed)
[name] [name]    [id number]    01-10-1951  Primary Care Physician:Pharr, Thayer Jew, MD Date of Appointment: 11/16/2020 Established Patient Visit  Chief complaint:   Chief Complaint  Patient presents with  . Follow-up    shortness of breath with increased exertion. no acute symptoms     HPI: [name] [name] is a 69 y.o. woman with history of tobacco use and rhinitis as well as pulmonary nodules.  Interval Updates: Here for one year follow up.  Had repeat CT scan showing stable pulmonary nodule.  Her nasal drainage is worse. She stopped all nasal sprays. Atrovent nasal spray didn't seem to help. Prior to this was taking flonase, didn't help. Has a sore throat.   She is having dyspnea on exertion - walking up hills and stairs. No difficulty with ADLs. Never been prescribed PFTs. Lives at home with husband.   I have reviewed the patient's family social and past medical history and updated as appropriate.   Past Medical History:  Diagnosis Date  . Adenomatous colon polyp   . Allergy   . Anxiety   . Cancer ([facility])    skin  . Cataract    bilateral  . COPD (chronic obstructive pulmonary disease) ([facility])   . Depression   . Diverticulosis   . Endometriosis   . GERD (gastroesophageal reflux disease)   . Glaucoma   . Hiatal hernia   . IBS (irritable bowel syndrome)   . Inguinal hernia   . Pneumothorax     Past Surgical History:  Procedure Laterality Date  . ABDOMINAL HYSTERECTOMY    . PLEURAL SCARIFICATION    . ruptured disk    . torn tendon    . TUBAL LIGATION      Family History  Problem Relation Age of Onset  . Heart failure Mother   . Thyroid cancer Maternal Grandmother   . Osteoporosis Sister   . Crohn's disease Sister   . Neuropathy Sister   . Thyroid disease Sister   . Lung disease Sister   . Colon polyps Sister   . Colon cancer Neg Hx     Social History   Occupational History  . Occupation: locksmith  Tobacco Use  . Smoking status: Former  Smoker    Packs/day: 1.50    Years: 40.00    Pack years: 60.00    Types: Cigarettes    Start date: 12/31/1971    Quit date: 02/10/2012    Years since quitting: 8.7  . Smokeless tobacco: Never Used  Vaping Use  . Vaping Use: Never used  Substance and Sexual Activity  . Alcohol use: No  . Drug use: No  . Sexual activity: Never     Physical Exam: Blood pressure 126/78, pulse 68, temperature 98.1 F (36.7 C), temperature source Temporal, height 5\' 9"  (1.753 m), weight 221 lb (100.2 kg), SpO2 95 %.  Gen:      No acute distress ENT:  Cobblestoning noted in oropharynx,  Lungs:    No increased respiratory effort, symmetric chest wall excursion, clear to auscultation bilaterally, no wheezes or crackles CV:         Regular rate and rhythm; no murmurs, rubs, or gallops.  No pedal edema   Data Reviewed: Imaging: I have personally reviewed the CT Chest with stable 40mm pulmonary nodule.   PFTs: None on file.  Labs:  Immunization status: Immunization History  Administered Date(s) Administered  . Influenza, High Dose Seasonal PF 09/16/2019,  09/18/2020  . Influenza-Unspecified 09/29/2018  . PFIZER SARS-COV-2 Vaccination 01/21/2020, 02/11/2020  . Td 12/26/2014  . Zoster Recombinat (Shingrix) 09/22/2019    Assessment:  Shortness of breath Pulmonary nodules Chronic rhinitis  Plan/Recommendations: Dyspnea possibly secondary to smoking related lung disease. Offered her work up with PFTs and albuterol as needed for symptom management and she declined. Will trial cetirizine nightly for rhinitis. She has not had a good experience with nasal sprays. Counseled on side effects including dry mouth.  For nodules: Repeat CT Scan in October 2022. If stable no further follow up needed.   Fleischner Society Guidelines 2017 MacMahon H, Naidich DP, Goo JM, et al. Guidelines for management of incidental pulmonary nodules detected on CT Images: From the Fleischner Society. Radiology 2017; [id number].  Copyright  2017 Radiological Society of [place].  Evaluation of the incidental solid pulmonary nodule in adults Nodule size (mm) Low (<5%) cancer risk High (>65%) or moderate (5 to 65%) cancer risk  Solitary  <6 No routine follow-up Optional CT at 12 months  6 to 8 CT at 6 to 12 months, then consider CT at 18 to 24 months CT at 6 to 12 months, then CT at 18 to 24 months  >8 CT at 3 months, then at 9 and 24 months FDG PET/CT, biopsy or resection  Multiple (evaluation based on largest nodule)  <6 No routine follow-up Optional CT at 12 months  ?6 CT at 3 to 6 months, then consider CT at 18 to 24 months CT at 3 to 6 months, then CT at 18 to 24 months    Not applicable to patients age <35 years, in lung cancer screening, with immunosuppression, known pulmonary disease or symptoms or active primary cancer.  Chest CT performed without contrast as contiguous 1 mm sections using low dose technique.  Growing or FDG-avid nodules should undergo biopsy or resection. Growth is defined as >1.5 mm increase.  Nodules unchanged for >2 years are benign.   CT: computed tomography; FDG: 18-fluorodeoxyglucose; PET: positron emission tomography.  Return to Care: Return in about 1 year (around 11/16/2021) for shortness of breath, lung nodules.[name] Llamas, MD Pulmonary and [place]

## 2020-12-01 ENCOUNTER — Ambulatory Visit: Payer: Medicare Other | Attending: Internal Medicine

## 2020-12-01 DIAGNOSIS — Z23 Encounter for immunization: Secondary | ICD-10-CM

## 2020-12-01 NOTE — Progress Notes (Signed)
   Covid-19 Vaccination Clinic  Name:  [name] [name]    MRN: [id number] DOB: 1951-11-18  12/01/2020  [name] was observed post Covid-19 immunization for 15 minutes without incident. She was provided with Vaccine Information Sheet and instruction to access the V-Safe system.   [name] was instructed to call 911 with any severe reactions post vaccine: Marland Kitchen Difficulty breathing  . Swelling of face and throat  . A fast heartbeat  . A bad rash all over body  . Dizziness and weakness   Immunizations Administered    Name Date Dose VIS Date Route   Pfizer COVID-19 Vaccine 12/01/2020  1:42 PM 0.3 mL 10/18/2020 Intramuscular   Manufacturer: [place]   Lot: [id number]   NDC: [address]-0

## 2021-03-14 DIAGNOSIS — M5030 Other cervical disc degeneration, unspecified cervical region: Secondary | ICD-10-CM | POA: Diagnosis not present

## 2021-03-20 DIAGNOSIS — Z85828 Personal history of other malignant neoplasm of skin: Secondary | ICD-10-CM | POA: Diagnosis not present

## 2021-03-20 DIAGNOSIS — X32XXXD Exposure to sunlight, subsequent encounter: Secondary | ICD-10-CM | POA: Diagnosis not present

## 2021-03-20 DIAGNOSIS — L570 Actinic keratosis: Secondary | ICD-10-CM | POA: Diagnosis not present

## 2021-03-20 DIAGNOSIS — C4402 Squamous cell carcinoma of skin of lip: Secondary | ICD-10-CM | POA: Diagnosis not present

## 2021-03-20 DIAGNOSIS — Z08 Encounter for follow-up examination after completed treatment for malignant neoplasm: Secondary | ICD-10-CM | POA: Diagnosis not present

## 2021-03-29 ENCOUNTER — Other Ambulatory Visit: Payer: Self-pay | Admitting: Internal Medicine

## 2021-03-29 DIAGNOSIS — Z1231 Encounter for screening mammogram for malignant neoplasm of breast: Secondary | ICD-10-CM

## 2021-04-17 DIAGNOSIS — X32XXXD Exposure to sunlight, subsequent encounter: Secondary | ICD-10-CM | POA: Diagnosis not present

## 2021-04-17 DIAGNOSIS — L570 Actinic keratosis: Secondary | ICD-10-CM | POA: Diagnosis not present

## 2021-04-17 DIAGNOSIS — Z85828 Personal history of other malignant neoplasm of skin: Secondary | ICD-10-CM | POA: Diagnosis not present

## 2021-04-17 DIAGNOSIS — Z08 Encounter for follow-up examination after completed treatment for malignant neoplasm: Secondary | ICD-10-CM | POA: Diagnosis not present

## 2021-04-26 DIAGNOSIS — K4090 Unilateral inguinal hernia, without obstruction or gangrene, not specified as recurrent: Secondary | ICD-10-CM | POA: Diagnosis not present

## 2021-04-26 DIAGNOSIS — E7800 Pure hypercholesterolemia, unspecified: Secondary | ICD-10-CM | POA: Diagnosis not present

## 2021-05-02 DIAGNOSIS — E039 Hypothyroidism, unspecified: Secondary | ICD-10-CM | POA: Diagnosis not present

## 2021-05-02 DIAGNOSIS — F339 Major depressive disorder, recurrent, unspecified: Secondary | ICD-10-CM | POA: Diagnosis not present

## 2021-05-02 DIAGNOSIS — F419 Anxiety disorder, unspecified: Secondary | ICD-10-CM | POA: Diagnosis not present

## 2021-05-02 DIAGNOSIS — M8580 Other specified disorders of bone density and structure, unspecified site: Secondary | ICD-10-CM | POA: Diagnosis not present

## 2021-05-02 DIAGNOSIS — R911 Solitary pulmonary nodule: Secondary | ICD-10-CM | POA: Diagnosis not present

## 2021-05-02 DIAGNOSIS — Z789 Other specified health status: Secondary | ICD-10-CM | POA: Diagnosis not present

## 2021-05-02 DIAGNOSIS — I2510 Atherosclerotic heart disease of native coronary artery without angina pectoris: Secondary | ICD-10-CM | POA: Diagnosis not present

## 2021-05-02 DIAGNOSIS — K219 Gastro-esophageal reflux disease without esophagitis: Secondary | ICD-10-CM | POA: Diagnosis not present

## 2021-05-02 DIAGNOSIS — I700 Atherosclerosis of aorta: Secondary | ICD-10-CM | POA: Diagnosis not present

## 2021-05-02 DIAGNOSIS — Z0000 Encounter for general adult medical examination without abnormal findings: Secondary | ICD-10-CM | POA: Diagnosis not present

## 2021-05-19 ENCOUNTER — Other Ambulatory Visit: Payer: Self-pay

## 2021-05-19 ENCOUNTER — Ambulatory Visit
Admission: RE | Admit: 2021-05-19 | Discharge: 2021-05-19 | Disposition: A | Discharge status: Home / Self Care | Payer: Medicare Other | Source: Ambulatory Visit | Attending: Internal Medicine | Admitting: Internal Medicine

## 2021-05-19 DIAGNOSIS — Z1231 Encounter for screening mammogram for malignant neoplasm of breast: Secondary | ICD-10-CM | POA: Diagnosis not present

## 2021-05-21 ENCOUNTER — Ambulatory Visit: Payer: Medicare Other

## 2021-07-06 DIAGNOSIS — U071 COVID-19: Secondary | ICD-10-CM | POA: Diagnosis not present

## 2021-07-08 DIAGNOSIS — Z20822 Contact with and (suspected) exposure to covid-19: Secondary | ICD-10-CM | POA: Diagnosis not present

## 2021-08-13 DIAGNOSIS — Z961 Presence of intraocular lens: Secondary | ICD-10-CM | POA: Diagnosis not present

## 2021-09-11 DIAGNOSIS — Z08 Encounter for follow-up examination after completed treatment for malignant neoplasm: Secondary | ICD-10-CM | POA: Diagnosis not present

## 2021-09-11 DIAGNOSIS — M19012 Primary osteoarthritis, left shoulder: Secondary | ICD-10-CM | POA: Diagnosis not present

## 2021-09-11 DIAGNOSIS — X32XXXD Exposure to sunlight, subsequent encounter: Secondary | ICD-10-CM | POA: Diagnosis not present

## 2021-09-11 DIAGNOSIS — Z85828 Personal history of other malignant neoplasm of skin: Secondary | ICD-10-CM | POA: Diagnosis not present

## 2021-09-11 DIAGNOSIS — L570 Actinic keratosis: Secondary | ICD-10-CM | POA: Diagnosis not present

## 2021-09-11 DIAGNOSIS — M5030 Other cervical disc degeneration, unspecified cervical region: Secondary | ICD-10-CM | POA: Diagnosis not present

## 2021-09-27 ENCOUNTER — Encounter: Payer: Self-pay | Admitting: Gastroenterology

## 2021-10-02 DIAGNOSIS — Z23 Encounter for immunization: Secondary | ICD-10-CM | POA: Diagnosis not present

## 2021-10-23 DIAGNOSIS — J3489 Other specified disorders of nose and nasal sinuses: Secondary | ICD-10-CM | POA: Diagnosis not present

## 2021-10-23 DIAGNOSIS — R5383 Other fatigue: Secondary | ICD-10-CM | POA: Diagnosis not present

## 2021-10-23 DIAGNOSIS — J029 Acute pharyngitis, unspecified: Secondary | ICD-10-CM | POA: Diagnosis not present

## 2021-11-12 DIAGNOSIS — I2584 Coronary atherosclerosis due to calcified coronary lesion: Secondary | ICD-10-CM | POA: Diagnosis not present

## 2021-11-12 DIAGNOSIS — D126 Benign neoplasm of colon, unspecified: Secondary | ICD-10-CM | POA: Diagnosis not present

## 2021-11-12 DIAGNOSIS — I700 Atherosclerosis of aorta: Secondary | ICD-10-CM | POA: Diagnosis not present

## 2021-11-12 DIAGNOSIS — F418 Other specified anxiety disorders: Secondary | ICD-10-CM | POA: Diagnosis not present

## 2021-11-12 DIAGNOSIS — E049 Nontoxic goiter, unspecified: Secondary | ICD-10-CM | POA: Diagnosis not present

## 2021-11-12 DIAGNOSIS — J449 Chronic obstructive pulmonary disease, unspecified: Secondary | ICD-10-CM | POA: Diagnosis not present

## 2021-11-12 DIAGNOSIS — R911 Solitary pulmonary nodule: Secondary | ICD-10-CM | POA: Diagnosis not present

## 2021-11-14 ENCOUNTER — Other Ambulatory Visit: Payer: Self-pay | Admitting: Internal Medicine

## 2021-11-14 DIAGNOSIS — R911 Solitary pulmonary nodule: Secondary | ICD-10-CM

## 2021-11-15 ENCOUNTER — Other Ambulatory Visit: Payer: Self-pay

## 2021-11-15 ENCOUNTER — Ambulatory Visit (HOSPITAL_COMMUNITY)
Admission: RE | Admit: 2021-11-15 | Discharge: 2021-11-15 | Disposition: A | Discharge status: Home / Self Care | Payer: Medicare Other | Source: Ambulatory Visit | Attending: Internal Medicine | Admitting: Internal Medicine

## 2021-11-15 DIAGNOSIS — Z87891 Personal history of nicotine dependence: Secondary | ICD-10-CM | POA: Insufficient documentation

## 2021-11-15 DIAGNOSIS — R918 Other nonspecific abnormal finding of lung field: Secondary | ICD-10-CM | POA: Diagnosis not present

## 2021-11-15 DIAGNOSIS — I700 Atherosclerosis of aorta: Secondary | ICD-10-CM | POA: Diagnosis not present

## 2021-11-15 DIAGNOSIS — R911 Solitary pulmonary nodule: Secondary | ICD-10-CM | POA: Diagnosis not present

## 2021-11-16 ENCOUNTER — Ambulatory Visit (HOSPITAL_COMMUNITY): Payer: Medicare Other

## 2021-11-19 ENCOUNTER — Ambulatory Visit (INDEPENDENT_AMBULATORY_CARE_PROVIDER_SITE_OTHER): Payer: Medicare Other | Admitting: Internal Medicine

## 2021-11-19 ENCOUNTER — Encounter: Payer: Self-pay | Admitting: Internal Medicine

## 2021-11-19 ENCOUNTER — Other Ambulatory Visit: Payer: Self-pay

## 2021-11-19 VITALS — BP 130/80 | HR 70 | Temp 98.0°F | Ht 69.5 in | Wt 222.8 lb

## 2021-11-19 DIAGNOSIS — R911 Solitary pulmonary nodule: Secondary | ICD-10-CM | POA: Diagnosis not present

## 2021-11-19 DIAGNOSIS — R0602 Shortness of breath: Secondary | ICD-10-CM | POA: Diagnosis not present

## 2021-11-19 DIAGNOSIS — Z87891 Personal history of nicotine dependence: Secondary | ICD-10-CM

## 2021-11-19 DIAGNOSIS — J310 Chronic rhinitis: Secondary | ICD-10-CM

## 2021-11-19 NOTE — Patient Instructions (Signed)
I am happy to see you back as needed, or if your breathing worsens..  The nodule is stable in your lungs.   I think your drainage, congestion and sore throat are related to chronic rhinitis. Follow up with ENT is a good idea.   Singulair is the medication that would be another thing to try for the drainage. You can let me know if you want to try it.

## 2021-11-19 NOTE — Progress Notes (Signed)
[name] [name]    [id number]    March 27, 1951  Primary Care Physician:Pharr, Thayer Jew, MD Date of Appointment: 11/19/2021 Established Patient Visit  Chief complaint:   Chief Complaint  Patient presents with   Follow-up    Pt is following up after recent CT.  Pt states she has been doing okay since last visit.    HPI: [name] [name] is a 70 y.o. woman with history of tobacco use and rhinitis as well as pulmonary nodule.   Interval Updates: Here for one year follow up after CT Scan She is still having chronic rhinitis  She is seeing an ENT  She didn't think the zyrtec helped her drainage.    Atrovent and flonase nasal sprays haven't helped in the past.   She has had two rounds of antibiotics whihch did not help.   She does have some dyspnea walking up hill, no chest tightness or wheezing. Feels its due to weight.   I have reviewed the patient's family social and past medical history and updated as appropriate.   Past Medical History:  Diagnosis Date   Adenomatous colon polyp    Allergy    Anxiety    Cancer ([place])    skin   Cataract    bilateral   COPD (chronic obstructive pulmonary disease) ([facility])    Depression    Diverticulosis    Endometriosis    GERD (gastroesophageal reflux disease)    Glaucoma    Hiatal hernia    IBS (irritable bowel syndrome)    Inguinal hernia    Pneumothorax     Past Surgical History:  Procedure Laterality Date   ABDOMINAL HYSTERECTOMY     PLEURAL SCARIFICATION     ruptured disk     torn tendon     TUBAL LIGATION      Family History  Problem Relation Age of Onset   Heart failure Mother    Thyroid cancer Maternal Grandmother    Osteoporosis Sister    Crohn's disease Sister    Neuropathy Sister    Thyroid disease Sister    Lung disease Sister    Colon polyps Sister    Colon cancer Neg Hx    Breast cancer Neg Hx     Social History   Occupational History   Occupation: locksmith  Tobacco Use   Smoking status:  Former    Packs/day: 1.50    Years: 40.00    Pack years: 60.00    Types: Cigarettes    Start date: 12/31/1971    Quit date: 02/10/2012    Years since quitting: 9.7   Smokeless tobacco: Never  Vaping Use   Vaping Use: Never used  Substance and Sexual Activity   Alcohol use: No   Drug use: No   Sexual activity: Never     Physical Exam: Blood pressure 130/80, pulse 70, temperature 98 F (36.7 C), temperature source Oral, height 5' 9.5" (1.765 m), weight 222 lb 12.8 oz (101.1 kg), SpO2 96 %.  Gen:      No acute distress ENT:  +cobblestoning in oropharynx Lungs:    No increased respiratory effort, symmetric chest wall excursion, clear to auscultation bilaterally, no wheezes or crackles CV:         Regular rate and rhythm; no murmurs, rubs, or gallops.  No pedal edema   Data Reviewed: Imaging: I have personally reviewed the CT Chest from Nov 2022 with stable 22mm pulmonary nodule.   PFTs: None  on file.  Labs:  Immunization status: Immunization History  Administered Date(s) Administered   Influenza, High Dose Seasonal PF 10/16/2018, 09/16/2019, 09/18/2020   Influenza, Seasonal, Injecte, Preservative Fre 09/13/2014   Influenza-Unspecified 09/22/2012, 09/29/2018, 09/15/2020, 10/02/2021   PFIZER(Purple Top)SARS-COV-2 Vaccination 01/21/2020, 02/11/2020   Td 12/26/2014   Zoster Recombinat (Shingrix) 09/22/2019    Assessment:  Shortness of breath - unchanged Solitary pulmonary nodule 17mm in the RML - stable since 2020.  Chronic rhinitis - unchanged  Plan/Recommendations: Dyspnea not currently bothersome. She will let us know if she wants to try albuterol prn or PFTs.  Recommend trying singulair for rhinitis. She will let us know. She is following up with ENT.   Stable pulmonary nodule. No further follow up needed for this. Reviewed CT results with her.    Return to Care: Return if symptoms worsen or fail to improve.   Lenice Llamas, MD Pulmonary and [place]

## 2021-11-21 DIAGNOSIS — Z20828 Contact with and (suspected) exposure to other viral communicable diseases: Secondary | ICD-10-CM | POA: Diagnosis not present

## 2021-12-03 DIAGNOSIS — R911 Solitary pulmonary nodule: Secondary | ICD-10-CM | POA: Diagnosis not present

## 2021-12-03 DIAGNOSIS — J3489 Other specified disorders of nose and nasal sinuses: Secondary | ICD-10-CM | POA: Diagnosis not present

## 2021-12-03 DIAGNOSIS — R0989 Other specified symptoms and signs involving the circulatory and respiratory systems: Secondary | ICD-10-CM | POA: Diagnosis not present

## 2021-12-03 DIAGNOSIS — Z87891 Personal history of nicotine dependence: Secondary | ICD-10-CM | POA: Diagnosis not present

## 2021-12-18 DIAGNOSIS — L570 Actinic keratosis: Secondary | ICD-10-CM | POA: Diagnosis not present

## 2021-12-18 DIAGNOSIS — X32XXXD Exposure to sunlight, subsequent encounter: Secondary | ICD-10-CM | POA: Diagnosis not present

## 2021-12-30 HISTORY — PX: COLONOSCOPY: SHX174

## 2022-01-21 DIAGNOSIS — U071 COVID-19: Secondary | ICD-10-CM | POA: Diagnosis not present

## 2022-01-31 DIAGNOSIS — R059 Cough, unspecified: Secondary | ICD-10-CM | POA: Diagnosis not present

## 2022-01-31 DIAGNOSIS — J4521 Mild intermittent asthma with (acute) exacerbation: Secondary | ICD-10-CM | POA: Diagnosis not present

## 2022-02-08 DIAGNOSIS — Z20822 Contact with and (suspected) exposure to covid-19: Secondary | ICD-10-CM | POA: Diagnosis not present

## 2022-02-15 DIAGNOSIS — J441 Chronic obstructive pulmonary disease with (acute) exacerbation: Secondary | ICD-10-CM | POA: Diagnosis not present

## 2022-02-15 DIAGNOSIS — Z87891 Personal history of nicotine dependence: Secondary | ICD-10-CM | POA: Diagnosis not present

## 2022-02-15 DIAGNOSIS — R053 Chronic cough: Secondary | ICD-10-CM | POA: Diagnosis not present

## 2022-02-15 DIAGNOSIS — R062 Wheezing: Secondary | ICD-10-CM | POA: Diagnosis not present

## 2022-02-15 DIAGNOSIS — R0602 Shortness of breath: Secondary | ICD-10-CM | POA: Diagnosis not present

## 2022-02-21 ENCOUNTER — Telehealth: Payer: Self-pay | Admitting: Internal Medicine

## 2022-02-21 NOTE — Telephone Encounter (Signed)
Received request to see patient again from Dr. Pennie Banter office. Will call patient and offer appointment.

## 2022-02-22 NOTE — Telephone Encounter (Signed)
Patient is scheduled 03/06/2022 at 10:45am- nothing further needed.

## 2022-02-26 ENCOUNTER — Ambulatory Visit (INDEPENDENT_AMBULATORY_CARE_PROVIDER_SITE_OTHER): Payer: Medicare Other | Admitting: Gastroenterology

## 2022-02-26 ENCOUNTER — Encounter: Payer: Self-pay | Admitting: Gastroenterology

## 2022-02-26 VITALS — BP 114/76 | HR 76 | Ht 69.5 in | Wt 219.0 lb

## 2022-02-26 DIAGNOSIS — K582 Mixed irritable bowel syndrome: Secondary | ICD-10-CM | POA: Diagnosis not present

## 2022-02-26 DIAGNOSIS — Z860101 Personal history of adenomatous and serrated colon polyps: Secondary | ICD-10-CM

## 2022-02-26 DIAGNOSIS — Z86010 Personal history of colonic polyps: Secondary | ICD-10-CM | POA: Diagnosis not present

## 2022-02-26 DIAGNOSIS — M6289 Other specified disorders of muscle: Secondary | ICD-10-CM

## 2022-02-26 NOTE — Patient Instructions (Signed)
If you are age 71 or older, your body mass index should be between 23-30. Your Body mass index is 31.88 kg/m. If this is out of the aforementioned range listed, please consider follow up with your Primary Care Provider.  If you are age 52 or younger, your body mass index should be between 19-25. Your Body mass index is 31.88 kg/m. If this is out of the aformentioned range listed, please consider follow up with your Primary Care Provider.   ________________________________________________________  The [place] providers would like to encourage you to use [facility] to communicate with providers for non-urgent requests or questions.  Due to long hold times on the telephone, sending your provider a message by [facility] may be a faster and more efficient way to get a response.  Please allow 48 business hours for a response.  Please remember that this is for non-urgent requests.  _______________________________________________________   Dennis Bast have been scheduled for a colonoscopy. Please follow written instructions given to you at your visit today.  Please pick up your prep supplies at the pharmacy within the next 1-3 days. If you use inhalers (even only as needed), please bring them with you on the day of your procedure.   Increase fruits and vegetables and dietary fiber   Increase water intake to 8-10 cups per day  Will consider pelvic floor physical therapy if persistent symptoms   Due to recent changes in healthcare laws, you may see the results of your imaging and laboratory studies on MyChart before your provider has had a chance to review them.  We understand that in some cases there may be results that are confusing or concerning to you. Not all laboratory results come back in the same time frame and the provider may be waiting for multiple results in order to interpret others.  Please give [place] 48 hours in order for your provider to thoroughly review all the results before contacting the office for  clarification of your results.    I appreciate the  opportunity to care for you  Thank You   Harl Bowie , MD

## 2022-02-26 NOTE — Progress Notes (Signed)
[name] [name]    [id number]    Mar 10, 1951  Primary Care Physician:Pharr, Thayer Jew, MD  Referring Physician: Deland Pretty, [address] [place],  [place] [address]   Chief complaint: Constipation HPI: 71 year old very pleasant female here for follow-up visit for chronic constipation with pelvic floor dysfunction She has daily bowel movements but has to strain and has sensation of incomplete evacuation, has increased urgency with seepage when she is walking or doing any kind of activity  Last office visit January 27, 2019 by Alonza Bogus for diarrhea after she was on a course of antibiotics  Colonoscopy August 22, 2016 - The perianal and digital rectal examinations were normal. - A 6 mm polyp was found in the ascending colon. The polyp was sessile. The polyp was removed with a cold snare. Resection and retrieval were complete. - A 3 mm polyp was found in the descending colon. The polyp was sessile. The polyp was removed with a cold biopsy forceps. Resection and retrieval were complete. - Scattered small-mouthed diverticula were found in the sigmoid colon. - The exam was otherwise without abnormality.  Sessile serrated adenoma and hyperplastic polyp respectively   CT Chest 11/15/2021 1. No acute intrathoracic pathology. 2. Stable 6 mm right middle lobe nodule dating back to 10/06/2019. 3. Aortic Atherosclerosis (ICD10-I70.0).   Outpatient Encounter Medications as of 02/26/2022  Medication Sig   ALPRAZolam (XANAX) 0.25 MG tablet Take 0.25 mg by mouth as needed.    aspirin EC 81 MG tablet Take 81 mg by mouth daily. Swallow whole.   Cholecalciferol (VITAMIN D PO) Take 5,000 Units by mouth daily.    DULoxetine (CYMBALTA) 30 MG capsule Take 30 mg by mouth daily.   ibuprofen (ADVIL) 200 MG tablet Take 200 mg by mouth every 6 (six) hours as needed for pain.   promethazine (PHENERGAN) 25 MG tablet Take 25 mg by mouth every 6 (six) hours as needed for  nausea or vomiting.   levocetirizine (XYZAL) 5 MG tablet Take 1 tablet by mouth daily as needed.   No facility-administered encounter medications on file as of 02/26/2022.    Allergies as of 02/26/2022 - Review Complete 02/26/2022  Allergen Reaction Noted   Bupropion  01/31/2022   Cefaclor Diarrhea 10/17/2008   Elemental sulfur  07/06/2013   Latex Other (See Comments) and Rash 02/10/2012   Ofloxacin Diarrhea 10/17/2008    Past Medical History:  Diagnosis Date   Adenomatous colon polyp    Allergy    Anxiety    Cancer ([place])    skin   Cataract    bilateral   COPD (chronic obstructive pulmonary disease) ([facility])    Depression    Diverticulosis    Endometriosis    GERD (gastroesophageal reflux disease)    Glaucoma    Hiatal hernia    IBS (irritable bowel syndrome)    Inguinal hernia    Pneumothorax     Past Surgical History:  Procedure Laterality Date   ABDOMINAL HYSTERECTOMY     COLONOSCOPY     PLEURAL SCARIFICATION     ruptured disk     torn tendon     TUBAL LIGATION      Family History  Problem Relation Age of Onset   Heart failure Mother    Osteoporosis Sister    Crohn's disease Sister    Neuropathy Sister    Thyroid disease Sister    Lung disease Sister    Colon polyps  Sister    Thyroid cancer Maternal Grandmother    Colon cancer Neg Hx    Breast cancer Neg Hx    Esophageal cancer Neg Hx    Pancreatic cancer Neg Hx    Stomach cancer Neg Hx     Social History   Socioeconomic History   Marital status: Married    Spouse name: Not on file   Number of children: 2   Years of education: Not on file   Highest education level: Not on file  Occupational History   Occupation: locksmith  Tobacco Use   Smoking status: Former    Packs/day: 1.50    Years: 40.00    Pack years: 60.00    Types: Cigarettes    Start date: 12/31/1971    Quit date: 02/10/2012    Years since quitting: 10.0   Smokeless tobacco: Never  Vaping Use   Vaping Use: Never used   Substance and Sexual Activity   Alcohol use: No   Drug use: No   Sexual activity: Never  Other Topics Concern   Not on file  Social History Narrative   Not on file   Social Determinants of Health   Financial Resource Strain: Not on file  Food Insecurity: Not on file  Transportation Needs: Not on file  Physical Activity: Not on file  Stress: Not on file  Social Connections: Not on file  Intimate Partner Violence: Not on file      Review of systems: All other review of systems negative except as mentioned in the HPI.   Physical Exam: Vitals:   02/26/22 1005  BP: 114/76  Pulse: 76   Body mass index is 31.88 kg/m. Gen:      No acute distress HEENT:  sclera anicteric Abd:      soft, non-tender; no palpable masses, no distension Ext:    No edema Neuro: alert and oriented x 3 Psych: normal mood and affect  Data Reviewed:  Reviewed labs, radiology imaging, old records and pertinent past GI work up   Assessment and Plan/Recommendations:  71 year old very pleasant female with history of chronic irritable bowel syndrome constipation and diarrhea, pelvic floor outlet dysfunction She has history of sessile serrated adenoma, is due for surveillance colonoscopy We will schedule for colonoscopy during this visit  The risks and benefits as well as alternatives of endoscopic procedure(s) have been discussed and reviewed. All questions answered. The patient agrees to proceed.  Will refer to pelvic floor physical therapy after colonoscopy for outlet dysfunction   This visit required 40 minutes of patient care (this includes precharting, chart review, review of results, face-to-face time used for counseling as well as treatment plan and follow-up. The patient was provided an opportunity to ask questions and all were answered. The patient agreed with the plan and demonstrated an understanding of the instructions.  Damaris Hippo , MD    CC: Deland Pretty, MD

## 2022-03-06 ENCOUNTER — Encounter: Payer: Self-pay | Admitting: Internal Medicine

## 2022-03-06 ENCOUNTER — Telehealth: Payer: Self-pay | Admitting: Internal Medicine

## 2022-03-06 ENCOUNTER — Other Ambulatory Visit: Payer: Self-pay

## 2022-03-06 ENCOUNTER — Ambulatory Visit (INDEPENDENT_AMBULATORY_CARE_PROVIDER_SITE_OTHER): Payer: Medicare Other | Admitting: Internal Medicine

## 2022-03-06 VITALS — BP 130/82 | HR 77 | Ht 69.5 in | Wt 218.4 lb

## 2022-03-06 DIAGNOSIS — R0602 Shortness of breath: Secondary | ICD-10-CM

## 2022-03-06 DIAGNOSIS — R058 Other specified cough: Secondary | ICD-10-CM | POA: Diagnosis not present

## 2022-03-06 DIAGNOSIS — R053 Chronic cough: Secondary | ICD-10-CM | POA: Diagnosis not present

## 2022-03-06 LAB — POCT EXHALED NITRIC OXIDE: FeNO level (ppb): 8

## 2022-03-06 MED ORDER — MONTELUKAST SODIUM 10 MG PO TABS: 10.0000 mg | ORAL_TABLET | Freq: Every day | ORAL | 5 refills | Status: DC

## 2022-03-06 MED ORDER — CHLORPHENIRAMINE MALEATE 4 MG PO TABS: 4.0000 mg | ORAL_TABLET | Freq: Two times a day (BID) | ORAL | 0 refills | Status: DC

## 2022-03-06 NOTE — Progress Notes (Signed)
? ?      ?[name]    [id number]    [birth year] ? ?Primary Care Physician:Pharr, Thayer Jew, MD ?Date of Appointment: 03/06/2022 ?Established Patient Visit ? ?Chief complaint:   ?Chief Complaint  ?Patient presents with  ? Follow-up  ?  asthma  ? ? ?HPI: ?[name] [name] is a 71 y.o. woman with history of tobacco use and rhinitis as well as pulmonary nodule.  ? ?Interval Updates: ?Here for follow up for shortness of breath. Had covid in January. Two rounds of antibiotics and prednisone by PCP since then.  ? ?Started on trelegy and albuterol inhaler by PCP. She used the trelegy a couple of times but nothing consistently.  ?Started on benzonatate and xyzal. ?She took xyzal for two weeks and didn't feel like it was doing any good so she stopped it.  ? ?She does have reflux and takes zantac as needed. Still has infrequent reflux.  ? ?She saw a PA in the ENT office in December and was given ipratropium and didn't feel it was helping.  ?She has taken flonase and also felt it didn't help.  ? ?Taking mucinex night time.  ? ?I have reviewed the patient's family social and past medical history and updated as appropriate.  ? ?Past Medical History:  ?Diagnosis Date  ? Adenomatous colon polyp   ? Allergy   ? Anxiety   ? Cancer [facility])   ? skin  ? Cataract   ? bilateral  ? COPD (chronic obstructive pulmonary disease) ([place])   ? Depression   ? Diverticulosis   ? Endometriosis   ? GERD (gastroesophageal reflux disease)   ? Glaucoma   ? Hiatal hernia   ? IBS (irritable bowel syndrome)   ? Inguinal hernia   ? Pneumothorax   ? ? ?Past Surgical History:  ?Procedure Laterality Date  ? ABDOMINAL HYSTERECTOMY    ? COLONOSCOPY    ? PLEURAL SCARIFICATION    ? ruptured disk    ? torn tendon    ? TUBAL LIGATION    ? ? ?Family History  ?Problem Relation Age of Onset  ? Heart failure Mother   ? Osteoporosis Sister   ? Crohn's disease Sister   ? Neuropathy Sister   ? Thyroid disease Sister   ? Lung disease Sister   ? Colon polyps Sister   ?  Thyroid cancer Maternal Grandmother   ? Colon cancer Neg Hx   ? Breast cancer Neg Hx   ? Esophageal cancer Neg Hx   ? Pancreatic cancer Neg Hx   ? Stomach cancer Neg Hx   ? ? ?Social History  ? ?Occupational History  ? Occupation: locksmith  ?Tobacco Use  ? Smoking status: Former  ?  Packs/day: 1.50  ?  Years: 40.00  ?  Pack years: 60.00  ?  Types: Cigarettes  ?  Start date: 12/31/1971  ?  Quit date: 02/10/2012  ?  Years since quitting: 10.0  ? Smokeless tobacco: Never  ?Vaping Use  ? Vaping Use: Never used  ?Substance and Sexual Activity  ? Alcohol use: No  ? Drug use: No  ? Sexual activity: Never  ? ? ? ?Physical Exam: ?Blood pressure 130/82, pulse 77, height 5' 9.5" (1.765 m), weight 218 lb 6.4 oz (99.1 kg), SpO2 96 %. ? ?Gen:      No acute distress, frequent coughing ?ENT:  +cobblestoning in oropharynx ?Lungs:    ctab no wheezes or crackles ?CV:  Regular rate and rhythm; no murmurs, rubs, or gallops.  No pedal edema ? ? ?Data Reviewed: ?Imaging: ?I have personally reviewed the CT Chest from Nov 2022 with stable 64m pulmonary nodule.  ? ?PFTs: ?None on file. ? ?Labs: ?Lab Results  ?Component Value Date  ? WBC 6.9 01/27/2019  ? HGB 14.3 01/27/2019  ? HCT 43.2 01/27/2019  ? MCV 91.2 01/27/2019  ? PLT 238.0 01/27/2019  ? ?Lab Results  ?Component Value Date  ? NA 142 10/20/2017  ? K 4.7 10/20/2017  ? CL 106 10/20/2017  ? CO2 28 10/20/2017  ? ? ?Immunization status: ?Immunization History  ?Administered Date(s) Administered  ? Influenza, High Dose Seasonal PF 08/28/2016, 10/16/2018, 09/16/2019, 09/18/2020  ? Influenza, Quadrivalent, Recombinant, Inj, Pf 10/10/2017, 10/16/2018, 09/15/2020, 10/02/2021  ? Influenza, Seasonal, Injecte, Preservative Fre 09/13/2014  ? Influenza-Unspecified 09/22/2012, 09/29/2018, 09/15/2020, 10/02/2021  ? PFIZER(Purple Top)SARS-COV-2 Vaccination 01/21/2020, 02/11/2020  ? Pneumococcal Conjugate-13 03/20/2017  ? Pneumococcal Polysaccharide-23 03/30/2018  ? Td 12/26/2014  ? Zoster  Recombinat (Shingrix) 09/22/2019  ? Zoster, Live 03/01/2015  ? ? ?Assessment:  ?History of tobacco use disorder - suspect Asthma COPD overlap syndrome.  ?Chronic Cough - multifactorial from chronic rhinitis and post nasal drainage with post viral cough syndrome ?Solitary pulmonary nodule 695min the RML - stable since 2020.  ?GERD ? ? ?Plan/Recommendations: ?FeNo in clinic today was 8 ppb ?Start chlortabs and montelukast for suspected allergic rhinitis and drainage from post viral cough.  ?Stop OTC mucinex and cold/sinus meds.  ?She has not felt that nasal sprays have helped in the past. Consider astelin/dymista in future.  ?Will get PFTs once coughing subsides in a few weeks.  ?Trial of prn albuterol.  ?She does have GERD and had some concern for LPR - ENT evaluation reviewed. Would consider trial of PPI if no relief.  ? ? ?Return to Care: ?Return in about 6 weeks (around 04/17/2022). ? ? ?NiLenice LlamasMD ?Pulmonary and Critical Care Medicine ?[place]Office:[phone] ? ? ? ? ? ?

## 2022-03-06 NOTE — Progress Notes (Signed)
The patient has been prescribed the inhaler albuterol. Inhaler technique was demonstrated to patient. The patient subsequently demonstrated correct technique.  

## 2022-03-06 NOTE — Patient Instructions (Addendum)
Please schedule follow up scheduled with myself in 6 weeks.  If my schedule is not open yet, we will contact you with a reminder closer to that time. Please call [phone] if you haven't heard from [place] a month before.  ? ?Before your next visit I would like you to have: ?Full set of PFTs ? ?Start taking montelukast allergy pill once a day.  ?Start taking the new allergy pill I sent to your pharmacy twice a day for the next couple of weeks.  ? ?Would stop mucinex.  ? ?Start taking albuterol inhaler as needed for shortness of breath ?You can hold off on trelegy inhaler for now.  ?

## 2022-03-06 NOTE — Telephone Encounter (Signed)
Spoke with the pt and notified to continue ns, as her AVS only states to d/c the Mucinex. She verbalized understanding. Nothing further needed.  ?

## 2022-03-07 LAB — RESPIRATORY ALLERGY PROFILE REGION II ~~LOC~~

## 2022-03-07 LAB — INTERPRETATION:

## 2022-03-11 ENCOUNTER — Encounter: Payer: Self-pay | Admitting: Gastroenterology

## 2022-04-08 ENCOUNTER — Other Ambulatory Visit: Payer: Self-pay | Admitting: Internal Medicine

## 2022-04-08 DIAGNOSIS — Z1231 Encounter for screening mammogram for malignant neoplasm of breast: Secondary | ICD-10-CM

## 2022-04-15 DIAGNOSIS — Z20822 Contact with and (suspected) exposure to covid-19: Secondary | ICD-10-CM | POA: Diagnosis not present

## 2022-04-24 ENCOUNTER — Ambulatory Visit (AMBULATORY_SURGERY_CENTER): Payer: Medicare Other | Admitting: Gastroenterology

## 2022-04-24 ENCOUNTER — Encounter: Payer: Self-pay | Admitting: Gastroenterology

## 2022-04-24 VITALS — BP 155/81 | HR 71 | Temp 98.7°F | Resp 11 | Ht 69.0 in | Wt 219.0 lb

## 2022-04-24 DIAGNOSIS — K5520 Angiodysplasia of colon without hemorrhage: Secondary | ICD-10-CM

## 2022-04-24 DIAGNOSIS — F32A Depression, unspecified: Secondary | ICD-10-CM | POA: Diagnosis not present

## 2022-04-24 DIAGNOSIS — J449 Chronic obstructive pulmonary disease, unspecified: Secondary | ICD-10-CM | POA: Diagnosis not present

## 2022-04-24 DIAGNOSIS — D126 Benign neoplasm of colon, unspecified: Secondary | ICD-10-CM | POA: Diagnosis not present

## 2022-04-24 DIAGNOSIS — Z86010 Personal history of colonic polyps: Secondary | ICD-10-CM

## 2022-04-24 DIAGNOSIS — D122 Benign neoplasm of ascending colon: Secondary | ICD-10-CM | POA: Diagnosis not present

## 2022-04-24 DIAGNOSIS — F419 Anxiety disorder, unspecified: Secondary | ICD-10-CM | POA: Diagnosis not present

## 2022-04-24 DIAGNOSIS — K635 Polyp of colon: Secondary | ICD-10-CM | POA: Diagnosis not present

## 2022-04-24 MED ORDER — SODIUM CHLORIDE 0.9 % IV SOLN: 500.0000 mL | Freq: Once | INTRAVENOUS | Status: DC

## 2022-04-24 NOTE — Progress Notes (Signed)
Pt's states no medical or surgical changes since previsit or office visit. 

## 2022-04-24 NOTE — Op Note (Signed)
[facility] ?Patient Name: [name] [name] ?Procedure Date: 04/24/2022 8:43 AM ?MRN: [id number] ?Endoscopist: Mauri Pole , MD ?Age: 71 ?Referring MD:  ?Date of Birth: 1951-06-07 ?Gender: Female ?Account #: [id number] ?Procedure:                Colonoscopy ?Indications:              High risk colon cancer surveillance: Personal  ?                          history of sessile serrated colon polyp (less than  ?                          10 mm in size) with no dysplasia ?Medicines:                Monitored Anesthesia Care ?Procedure:                Pre-Anesthesia Assessment: ?                          - Prior to the procedure, a History and Physical  ?                          was performed, and patient medications and  ?                          allergies were reviewed. The patient's tolerance of  ?                          previous anesthesia was also reviewed. The risks  ?                          and benefits of the procedure and the sedation  ?                          options and risks were discussed with the patient.  ?                          All questions were answered, and informed consent  ?                          was obtained. Prior Anticoagulants: The patient has  ?                          taken no previous anticoagulant or antiplatelet  ?                          agents. ASA Grade Assessment: II - A patient with  ?                          mild systemic disease. After reviewing the risks  ?                          and benefits, the patient was deemed in  ?  satisfactory condition to undergo the procedure. ?                          After obtaining informed consent, the colonoscope  ?                          was passed under direct vision. Throughout the  ?                          procedure, the patient's blood pressure, pulse, and  ?                          oxygen saturations were monitored continuously. The  ?                          Olympus PCF-H190DL  (#[id number]) Colonoscope was  ?                          introduced through the anus and advanced to the the  ?                          terminal ileum, with identification of the  ?                          appendiceal orifice and IC valve. The colonoscopy  ?                          was somewhat difficult due to restricted mobility  ?                          of the colon. Successful completion of the  ?                          procedure was aided by withdrawing the scope and  ?                          replacing with the pediatric endoscope. The patient  ?                          tolerated the procedure well. The quality of the  ?                          bowel preparation was adequate. The ileocecal  ?                          valve, appendiceal orifice, and rectum were  ?                          photographed. ?Scope In: 9:03:10 AM ?Scope Out: 9:31:32 AM ?Scope Withdrawal Time: 0 hours 11 minutes 11 seconds  ?Total Procedure Duration: 0 hours 28 minutes 22 seconds  ?Findings:                 The perianal and digital rectal examinations were  ?  normal. ?                          Three sessile polyps were found in the ascending  ?                          colon. The polyps were 4 to 7 mm in size. These  ?                          polyps were removed with a cold snare. Resection  ?                          and retrieval were complete. ?                          A single small angioectasia without bleeding was  ?                          found in the ascending colon. ?                          Discontinuous areas of nonbleeding ulcerated mucosa  ?                          were present in the entire colon. Biopsies were  ?                          taken with a cold forceps for histology. ?                          Multiple small-mouthed diverticula were found in  ?                          the sigmoid colon. ?                          Non-bleeding external and internal hemorrhoids were  ?                           found during retroflexion. The hemorrhoids were  ?                          medium-sized. ?Complications:            No immediate complications. ?Estimated Blood Loss:     Estimated blood loss was minimal. ?Impression:               - Three 4 to 7 mm polyps in the ascending colon,  ?                          removed with a cold snare. Resected and retrieved. ?                          - A single non-bleeding colonic angioectasia. ?                          -  Mucosal ulceration. Biopsied. ?                          - Diverticulosis in the sigmoid colon. ?                          - Non-bleeding external and internal hemorrhoids. ?Recommendation:           - Patient has a contact number available for  ?                          emergencies. The signs and symptoms of potential  ?                          delayed complications were discussed with the  ?                          patient. Return to normal activities tomorrow.  ?                          Written discharge instructions were provided to the  ?                          patient. ?                          - Resume previous diet. ?                          - Continue present medications. ?                          - Await pathology results. ?                          - Repeat colonoscopy date to be determined after  ?                          pending pathology results are reviewed for  ?                          surveillance based on pathology results. ?                          - Return to GI clinic in 2 months. ?Mauri Pole, MD ?04/24/2022 9:42:41 AM ?This report has been signed electronically. ?

## 2022-04-24 NOTE — Patient Instructions (Signed)
YOU HAD AN ENDOSCOPIC PROCEDURE TODAY AT THE [facility]:   Refer to the procedure report that was given to you for any specific questions about what was found during the examination.  If the procedure report does not answer your questions, please call your gastroenterologist to clarify.  If you requested that your care partner not be given the details of your procedure findings, then the procedure report has been included in a sealed envelope for you to review at your convenience later.  YOU SHOULD EXPECT: Some feelings of bloating in the abdomen. Passage of more gas than usual.  Walking can help get rid of the air that was put into your GI tract during the procedure and reduce the bloating. If you had a lower endoscopy (such as a colonoscopy or flexible sigmoidoscopy) you may notice spotting of blood in your stool or on the toilet paper. If you underwent a bowel prep for your procedure, you may not have a normal bowel movement for a few days.  Please Note:  You might notice some irritation and congestion in your nose or some drainage.  This is from the oxygen used during your procedure.  There is no need for concern and it should clear up in a day or so.  SYMPTOMS TO REPORT IMMEDIATELY:   Following lower endoscopy (colonoscopy or flexible sigmoidoscopy):  Excessive amounts of blood in the stool  Significant tenderness or worsening of abdominal pains  Swelling of the abdomen that is new, acute  Fever of 100F or higher   Following upper endoscopy (EGD)  Vomiting of blood or coffee ground material  New chest pain or pain under the shoulder blades  Painful or persistently difficult swallowing  New shortness of breath  Fever of 100F or higher  Black, tarry-looking stools  For urgent or emergent issues, a gastroenterologist can be reached at any hour by calling [phone]. Do not use MyChart messaging for urgent concerns.    DIET:  We do recommend a small meal at first, but  then you may proceed to your regular diet.  Drink plenty of fluids but you should avoid alcoholic beverages for 24 hours.  ACTIVITY:  You should plan to take it easy for the rest of today and you should NOT DRIVE or use heavy machinery until tomorrow (because of the sedation medicines used during the test).    FOLLOW UP: Our staff will call the number listed on your records 48-72 hours following your procedure to check on you and address any questions or concerns that you may have regarding the information given to you following your procedure. If we do not reach you, we will leave a message.  We will attempt to reach you two times.  During this call, we will ask if you have developed any symptoms of COVID 19. If you develop any symptoms (ie: fever, flu-like symptoms, shortness of breath, cough etc.) before then, please call [phone].  If you test positive for Covid 19 in the 2 weeks post procedure, please call and report this information to us.    If any biopsies were taken you will be contacted by phone or by letter within the next 1-3 weeks.  Please call us at [phone] if you have not heard about the biopsies in 3 weeks.    SIGNATURES/CONFIDENTIALITY: You and/or your care partner have signed paperwork which will be entered into your electronic medical record.  These signatures attest to the fact that that the information above on   your After Visit Summary has been reviewed and is understood.  Full responsibility of the confidentiality of this discharge information lies with you and/or your care-partner. 

## 2022-04-24 NOTE — Progress Notes (Signed)
[facility] History and Physical ? ? ?Primary Care Physician:  Deland Pretty, MD ? ? ?Reason for Procedure:  History of adenomatous colon polyps ? ?Plan:    Surveillance colonoscopy with possible interventions as needed ? ? ? ? ?HPI: [name] [name] is a very pleasant 71 y.o. female here for surveillance colonoscopy. ?Denies any nausea, vomiting, abdominal pain, melena or bright red blood per rectum ? ?The risks and benefits as well as alternatives of endoscopic procedure(s) have been discussed and reviewed. All questions answered. The patient agrees to proceed. ? ? ? ?Past Medical History:  ?Diagnosis Date  ? Adenomatous colon polyp   ? Allergy   ? Anxiety   ? Cancer [facility])   ? skin  ? Cataract   ? bilateral  ? COPD (chronic obstructive pulmonary disease) ([place])   ? Depression   ? Diverticulosis   ? Endometriosis   ? GERD (gastroesophageal reflux disease)   ? Glaucoma   ? Hiatal hernia   ? IBS (irritable bowel syndrome)   ? Inguinal hernia   ? Pneumothorax   ? ? ?Past Surgical History:  ?Procedure Laterality Date  ? ABDOMINAL HYSTERECTOMY    ? COLONOSCOPY    ? PLEURAL SCARIFICATION    ? ruptured disk    ? torn tendon    ? TUBAL LIGATION    ? ? ?Prior to Admission medications   ?Medication Sig Start Date End Date Taking? Authorizing Provider  ?ALPRAZolam (XANAX) 0.25 MG tablet Take 0.25 mg by mouth as needed.  06/06/14  Yes [provider]  ?aspirin EC 81 MG tablet Take 81 mg by mouth daily. Swallow whole.   Yes [provider]  ?Cholecalciferol (VITAMIN D PO) Take 5,000 Units by mouth daily.    Yes [provider]  ?DULoxetine (CYMBALTA) 30 MG capsule Take 30 mg by mouth daily. 10/22/20  Yes [provider]  ?promethazine (PHENERGAN) 25 MG tablet Take 25 mg by mouth every 6 (six) hours as needed for nausea or vomiting.   Yes [provider]  ?chlorpheniramine (EQ CHLORTABS) 4 MG tablet Take 1 tablet (4 mg total) by mouth in the morning and at bedtime. 03/06/22    Spero Geralds, MD  ?ibuprofen (ADVIL) 200 MG tablet Take 200 mg by mouth every 6 (six) hours as needed for pain.    [provider]  ?ipratropium (ATROVENT) 0.06 % nasal spray Place into both nostrils. 03/02/22   [provider]  ?levocetirizine (XYZAL) 5 MG tablet Take 1 tablet by mouth daily as needed. ?Patient not taking: Reported on 03/06/2022 02/15/22   [provider]  ?montelukast (SINGULAIR) 10 MG tablet Take 1 tablet (10 mg total) by mouth at bedtime. 03/06/22   Spero Geralds, MD  ?Polyethyl Glycol-Propyl Glycol (SYSTANE) 0.4-0.3 % SOLN Apply to eye.    [provider]  ? ? ?Current Outpatient Medications  ?Medication Sig Dispense Refill  ? ALPRAZolam (XANAX) 0.25 MG tablet Take 0.25 mg by mouth as needed.     ? aspirin EC 81 MG tablet Take 81 mg by mouth daily. Swallow whole.    ? Cholecalciferol (VITAMIN D PO) Take 5,000 Units by mouth daily.     ? DULoxetine (CYMBALTA) 30 MG capsule Take 30 mg by mouth daily.    ? promethazine (PHENERGAN) 25 MG tablet Take 25 mg by mouth every 6 (six) hours as needed for nausea or vomiting.    ? chlorpheniramine (EQ CHLORTABS) 4 MG tablet Take 1 tablet (4 mg  total) by mouth in the morning and at bedtime. 14 tablet 0  ? ibuprofen (ADVIL) 200 MG tablet Take 200 mg by mouth every 6 (six) hours as needed for pain.    ? ipratropium (ATROVENT) 0.06 % nasal spray Place into both nostrils.    ? levocetirizine (XYZAL) 5 MG tablet Take 1 tablet by mouth daily as needed. (Patient not taking: Reported on 03/06/2022)    ? montelukast (SINGULAIR) 10 MG tablet Take 1 tablet (10 mg total) by mouth at bedtime. 30 tablet 5  ? Polyethyl Glycol-Propyl Glycol (SYSTANE) 0.4-0.3 % SOLN Apply to eye.    ? ?Current Facility-Administered Medications  ?Medication Dose Route Frequency Provider Last Rate Last Admin  ? 0.9 %  sodium chloride infusion  500 mL Intravenous Once Brieana Shimmin, Venia Minks, MD      ? ? ?Allergies as of 04/24/2022 - Review Complete 04/24/2022   ?Allergen Reaction Noted  ? Bupropion  01/31/2022  ? Cefaclor Diarrhea 10/17/2008  ? Elemental sulfur  07/06/2013  ? Latex Other (See Comments) and Rash 02/10/2012  ? Ofloxacin Diarrhea 10/17/2008  ? ? ?Family History  ?Problem Relation Age of Onset  ? Heart failure Mother   ? Osteoporosis Sister   ? Crohn's disease Sister   ? Neuropathy Sister   ? Thyroid disease Sister   ? Lung disease Sister   ? Colon polyps Sister   ? Thyroid cancer Maternal Grandmother   ? Colon cancer Neg Hx   ? Breast cancer Neg Hx   ? Esophageal cancer Neg Hx   ? Pancreatic cancer Neg Hx   ? Stomach cancer Neg Hx   ? ? ?Social History  ? ?Socioeconomic History  ? Marital status: Married  ?  Spouse name: Not on file  ? Number of children: 2  ? Years of education: Not on file  ? Highest education level: Not on file  ?Occupational History  ? Occupation: locksmith  ?Tobacco Use  ? Smoking status: Former  ?  Packs/day: 1.50  ?  Years: 40.00  ?  Pack years: 60.00  ?  Types: Cigarettes  ?  Start date: 12/31/1971  ?  Quit date: 02/10/2012  ?  Years since quitting: 10.2  ? Smokeless tobacco: Never  ?Vaping Use  ? Vaping Use: Never used  ?Substance and Sexual Activity  ? Alcohol use: No  ? Drug use: No  ? Sexual activity: Never  ?Other Topics Concern  ? Not on file  ?Social History Narrative  ? Not on file  ? ?Social Determinants of Health  ? ?Financial Resource Strain: Not on file  ?Food Insecurity: Not on file  ?Transportation Needs: Not on file  ?Physical Activity: Not on file  ?Stress: Not on file  ?Social Connections: Not on file  ?Intimate Partner Violence: Not on file  ? ? ?Review of Systems: ? ?All other review of systems negative except as mentioned in the HPI. ? ?Physical Exam: ?Vital signs in last 24 hours: ?BP (!) 156/92   Pulse 91   Temp 98.7 ?F (37.1 ?C)   Ht '5\' 9"'$  (1.753 m)   Wt 219 lb (99.3 kg)   SpO2 95%   BMI 32.34 kg/m?  ?General:   Alert, NAD ?Lungs:  Clear .   ?Heart:  Regular rate and rhythm ?Abdomen:  Soft, nontender and  nondistended. ?Neuro/Psych:  Alert and cooperative. Normal mood and affect. A and O x 3 ? ?Reviewed labs, radiology imaging, old records and pertinent past GI work up ? ?Patient  is appropriate for planned procedure(s) and anesthesia in an ambulatory setting ? ? ?K. Denzil Magnuson , MD ?[phone]  ? ? ?  ?

## 2022-04-24 NOTE — Progress Notes (Signed)
Called to room to assist during endoscopic procedure.  Patient ID and intended procedure confirmed with present staff. Received instructions for my participation in the procedure from the performing physician.  

## 2022-04-24 NOTE — Progress Notes (Signed)
VSS, transported to PACU °

## 2022-04-26 ENCOUNTER — Telehealth: Payer: Self-pay | Admitting: *Deleted

## 2022-04-26 ENCOUNTER — Telehealth: Payer: Self-pay

## 2022-04-26 NOTE — Telephone Encounter (Signed)
Attempted 2nd f/u phone call. No answer. Left message.  °

## 2022-04-26 NOTE — Telephone Encounter (Signed)
?  Follow up Call- ? ? ?  04/24/2022  ?  7:30 AM  ?Call back number  ?Post procedure Call Back phone  # [phone]  ?Permission to leave phone message Yes  ? First follow up phone call, LVM ? ? ?

## 2022-04-29 ENCOUNTER — Encounter: Payer: Self-pay | Admitting: Internal Medicine

## 2022-04-29 ENCOUNTER — Ambulatory Visit (INDEPENDENT_AMBULATORY_CARE_PROVIDER_SITE_OTHER): Payer: Medicare Other | Admitting: Internal Medicine

## 2022-04-29 VITALS — BP 120/74 | HR 76 | Temp 97.7°F | Ht 70.0 in | Wt 216.8 lb

## 2022-04-29 DIAGNOSIS — R0602 Shortness of breath: Secondary | ICD-10-CM

## 2022-04-29 DIAGNOSIS — J301 Allergic rhinitis due to pollen: Secondary | ICD-10-CM

## 2022-04-29 DIAGNOSIS — J449 Chronic obstructive pulmonary disease, unspecified: Secondary | ICD-10-CM | POA: Diagnosis not present

## 2022-04-29 LAB — PULMONARY FUNCTION TEST
DL/VA % pred: 105 %
DL/VA: 4.17 ml/min/mmHg/L
DLCO cor % pred: 77 %
DLCO cor: 18.34 ml/min/mmHg
DLCO unc % pred: 77 %
DLCO unc: 18.34 ml/min/mmHg
FEF 25-75 Post: 1.35 L/sec
FEF 25-75 Pre: 1.04 L/sec
FEF2575-%Change-Post: 30 %
FEF2575-%Pred-Post: 59 %
FEF2575-%Pred-Pre: 46 %
FEV1-%Change-Post: 5 %
FEV1-%Pred-Post: 69 %
FEV1-%Pred-Pre: 65 %
FEV1-Post: 2 L
FEV1-Pre: 1.89 L
FEV1FVC-%Change-Post: 8 %
FEV1FVC-%Pred-Pre: 90 %
FEV6-%Change-Post: -2 %
FEV6-%Pred-Post: 74 %
FEV6-%Pred-Pre: 75 %
FEV6-Post: 2.68 L
FEV6-Pre: 2.74 L
FEV6FVC-%Change-Post: 0 %
FEV6FVC-%Pred-Post: 104 %
FEV6FVC-%Pred-Pre: 104 %
FVC-%Change-Post: -2 %
FVC-%Pred-Post: 71 %
FVC-%Pred-Pre: 72 %
FVC-Post: 2.68 L
FVC-Pre: 2.74 L
Post FEV1/FVC ratio: 74 %
Post FEV6/FVC ratio: 100 %
Pre FEV1/FVC ratio: 69 %
Pre FEV6/FVC Ratio: 100 %
RV % pred: 120 %
RV: 3 L
TLC % pred: 96 %
TLC: 5.76 L

## 2022-04-29 MED ORDER — ANORO ELLIPTA 62.5-25 MCG/ACT IN AEPB: 1.0000 | INHALATION_SPRAY | Freq: Every day | RESPIRATORY_TRACT | 5 refills | Status: DC

## 2022-04-29 NOTE — Progress Notes (Signed)
? ?      ?EILEENE KISLING    [id number]    09-13-1951 ? ?Primary Care Physician:Pharr, Thayer Jew, MD ?Date of Appointment: 04/29/2022 ?Established Patient Visit ? ?Chief complaint:   ?Chief Complaint  ?Patient presents with  ? Follow-up  ?  SOB unchanged, PFT review   ? ? ?HPI: ?UBAH RADKE is a 71 y.o. woman with history of tobacco use and rhinitis as well as pulmonary nodule.  ? ?Interval Updates: ?Here for follow up for shortness of breath. ? ?No prednisone or antibiotics since last visit.  ?Cough is improved. Still has mucus in her throat which has not gone away.  ?Did not take albuterol because she didn't feel like she needed it.  ? ?Apparently had an abnormal chest xray with PCP and is due for follow up with him about this.  ? ?I have reviewed the patient's family social and past medical history and updated as appropriate.  ? ?Past Medical History:  ?Diagnosis Date  ? Adenomatous colon polyp   ? Allergy   ? Anxiety   ? Cancer [facility])   ? skin  ? Cataract   ? bilateral  ? COPD (chronic obstructive pulmonary disease) ([place])   ? Depression   ? Diverticulosis   ? Endometriosis   ? GERD (gastroesophageal reflux disease)   ? Glaucoma   ? Hiatal hernia   ? IBS (irritable bowel syndrome)   ? Inguinal hernia   ? Pneumothorax   ? ? ?Past Surgical History:  ?Procedure Laterality Date  ? ABDOMINAL HYSTERECTOMY    ? COLONOSCOPY    ? PLEURAL SCARIFICATION    ? ruptured disk    ? torn tendon    ? TUBAL LIGATION    ? ? ?Family History  ?Problem Relation Age of Onset  ? Heart failure Mother   ? Osteoporosis Sister   ? Crohn's disease Sister   ? Neuropathy Sister   ? Thyroid disease Sister   ? Lung disease Sister   ? Colon polyps Sister   ? Thyroid cancer Maternal Grandmother   ? Colon cancer Neg Hx   ? Breast cancer Neg Hx   ? Esophageal cancer Neg Hx   ? Pancreatic cancer Neg Hx   ? Stomach cancer Neg Hx   ? ? ?Social History  ? ?Occupational History  ? Occupation: locksmith  ?Tobacco Use  ? Smoking status: Former  ?   Packs/day: 1.50  ?  Years: 40.00  ?  Pack years: 60.00  ?  Types: Cigarettes  ?  Start date: 12/31/1971  ?  Quit date: 02/10/2012  ?  Years since quitting: 10.2  ? Smokeless tobacco: Never  ?Vaping Use  ? Vaping Use: Never used  ?Substance and Sexual Activity  ? Alcohol use: No  ? Drug use: No  ? Sexual activity: Never  ? ? ? ?Physical Exam: ?Blood pressure 120/74, pulse 76, temperature 97.7 ?F (36.5 ?C), temperature source Oral, height '5\' 10"'$  (1.778 m), weight 216 lb 12.8 oz (98.3 kg), SpO2 96 %. ? ?Gen:      No distress, coughing improved.  ?ENT:  +cobblestoning in oropharynx ?Lungs:    ctab no wheezes or crackles ?CV:         RRR no mrg ? ? ?Data Reviewed: ?Imaging: ?I have personally reviewed the CT Chest from Oct 2022 with stable 48m pulmonary nodule.  ? ?PFTs: ?None on file. ? ?Labs: ?Lab Results  ?Component Value Date  ? WBC 6.9 01/27/2019  ? HGB  14.3 01/27/2019  ? HCT 43.2 01/27/2019  ? MCV 91.2 01/27/2019  ? PLT 238.0 01/27/2019  ? ?Lab Results  ?Component Value Date  ? NA 142 10/20/2017  ? K 4.7 10/20/2017  ? CL 106 10/20/2017  ? CO2 28 10/20/2017  ? ? ?Immunization status: ?Immunization History  ?Administered Date(s) Administered  ? Influenza, High Dose Seasonal PF 08/28/2016, 10/16/2018, 09/16/2019, 09/18/2020  ? Influenza, Quadrivalent, Recombinant, Inj, Pf 10/10/2017, 10/16/2018, 09/15/2020, 10/02/2021  ? Influenza, Seasonal, Injecte, Preservative Fre 09/13/2014  ? Influenza-Unspecified 09/22/2012, 09/29/2018, 09/15/2020, 10/02/2021  ? PFIZER(Purple Top)SARS-COV-2 Vaccination 01/21/2020, 02/11/2020  ? Pneumococcal Conjugate-13 03/20/2017  ? Pneumococcal Polysaccharide-23 03/30/2018  ? Td 12/26/2014  ? Zoster Recombinat (Shingrix) 09/22/2019  ? Zoster, Live 03/01/2015  ? ? ?Assessment:  ?COPD new diagnosis, moderate FEV1 65% of predicted ?Chronic Cough - multifactorial from chronic rhinitis and post nasal drainage with post viral cough syndrome ?Solitary pulmonary nodule 64m in the RML - stable since 2020.   ?GERD ? ? ?Plan/Recommendations: ?FeNo in clinic today was 8 ppb ?Continue singulair for allergic rhinitis with xyzal.  ?Consider astelin/dymista in future.  ?PFTs confirm COPD.  ?Resume Trial of prn albuterol. Start maintenance LAMA-LABA.  ?Consider PPI for globus sensation and mucus in throat in the future.  ? ?We discussed COPD disease management and progress at length today.  ? ?Will await Chest xray report from Dr. PPennie Banteroffice.  ? ?Return to Care: ?Return in about 4 months (around 08/30/2022). ? ? ?NLenice Llamas MD ?Pulmonary and Critical Care Medicine ?[place]?Office:[phone] ? ? ? ? ? ?

## 2022-04-29 NOTE — Progress Notes (Signed)
The patient has been prescribed the inhaler anoro. Inhaler technique was demonstrated to patient. The patient subsequently demonstrated correct technique. ? ? ?

## 2022-04-29 NOTE — Progress Notes (Signed)
Performed full PFT today. ?

## 2022-04-29 NOTE — Patient Instructions (Signed)
Performed full PFT today. ?

## 2022-04-29 NOTE — Patient Instructions (Signed)
Please schedule follow up scheduled with myself in 4 months.  If my schedule is not open yet, we will contact you with a reminder closer to that time. Please call [phone] if you haven't heard from [place] a month before.  ? ?Start anoro inhaler 1 puff once a day.  ?Take albuterol 2 puffs up to 4 times a day as needed.  ?Can start taking montelukast for allergies. Can take this with xyzal if allergies are bothersome. ? ?Understanding COPD  ? ?What is COPD? ?COPD stands for chronic obstructive pulmonary (lung) disease. COPD is a general term used for several lung diseases.  COPD is an umbrella term and encompasses other  common diseases in this group like chronic bronchitis and emphysema. Chronic asthma may also be included in this group. While some patients with COPD have only chronic bronchitis or emphysema, most patients have a combination of both.  You might hear these terms used in exchange for one another.  ? ?COPD adds to the work of the heart. Diseased lungs may reduce the amount of oxygen that goes to the blood. High blood pressure in blood vessels from the heart to the lungs makes it difficult for the heart to pump. Lung disease can also cause the body to produce too many red blood cells which may make the blood thicker and harder to pump.  ? ?Patients who have COPD with low oxygen levels may develop an enlarged heart (cor pulmonale). This condition weakens the heart and causes increased shortness of breath and swelling in the legs and feet.  ? ?Chronic bronchitis ?Chronic bronchitis is irritation and inflammation (swelling) of the lining in the bronchial tubes (air passages). The irritation causes coughing and an excess amount of mucus in the airways. The swelling makes it difficult to get air in and out of the lungs. The small, hair-like structures on the inside of the airways (called cilia) may be damaged by the irritation. The cilia are then unable to help clean mucus from the airways.  ?Bronchitis is  generally considered to be chronic when you have: a productive cough (cough up mucus) and shortness of breath that lasts about 3 months or more each year for 2 or more years in a row. Your doctor may define chronic bronchitis differently.  ? ?Emphysema ?Emphysema is the destruction, or breakdown, of the walls of the alveoli (air sacs) located at the end of the bronchial tubes. The damaged alveoli are not able to exchange oxygen and carbon dioxide between the lungs and the blood. The bronchioles lose their elasticity and collapse when you exhale, trapping air in the lungs. The trapped air keeps fresh air and oxygen from entering the lungs.  ? ?Who is affected by COPD? ?Emphysema and chronic bronchitis affect approximately 16 million people in the [place], or close to 11 percent of the population.  ? ?Symptoms of COPD  ?Shortness of breath  ?Shortness of breath with mild exercise (walking, using the stairs, etc.)  ?Chronic, productive cough (with mucus)  ?A feeling of "tightness" in the chest  ?Wheezing  ? ?What causes COPD? ?The two primary causes of COPD are cigarette smoking and alpha1-antitrypsin (AAT) deficiency. Air pollution and occupational dusts may also contribute to COPD, especially when the person exposed to these substances is a cigarette smoker.  ?Cigarette smoke causes COPD by irritating the airways and creating inflammation that narrows the airways, making it more difficult to breathe. Cigarette smoke also causes the cilia to stop working properly so mucus  and trapped particles are not cleaned from the airways. As a result, chronic cough and excess mucus production develop, leading to chronic bronchitis.  ?In some people, chronic bronchitis and infections can lead to destruction of the small airways, or emphysema.  ?AAT deficiency, an inherited disorder, can also lead to emphysema. Alpha antitrypsin (AAT) is a protective material produced in the liver and transported to the lungs to help combat  inflammation. When there is not enough of the chemical AAT, the body is no longer protected from an enzyme in the white blood cells.  ? ?How is COPD diagnosed?  ?To diagnose COPD, the physician needs to know: ?Do you smoke?  ?Have you had chronic exposure to dust or air pollutants?  ?Do other members of your family have lung disease?  ?Are you short of breath?  ?Do you get short of breath with exercise?  ?Do you have chronic cough and/or wheezing?  ?Do you cough up excess mucus?  ?To help with the diagnosis, the physician will conduct a thorough physical exam which includes:  ?Listening to your lungs and heart  ?Checking your blood pressure and pulse  ?Examining your nose and throat  ?Checking your feet and ankles for swelling  ? ?Laboratory and other tests ?Several laboratory and other tests are needed to confirm a diagnosis of COPD. These tests may include:  ?Chest X-ray to look for lung changes that could be caused by COPD  ? Spirometry and pulmonary function tests (PFTs) to determine lung volume and air flow  ?Pulse oximetry to measure the saturation of oxygen in the blood  ?Arterial blood gases (ABGs) to determine the amount of oxygen and carbon dioxide in the blood  ?Exercise testing to determine if the oxygen level in the blood drops during exercise  ? ?Treatment ?In the beginning stages of COPD, there is minimal shortness of breath that may be noticed only during exercise. As the disease progresses, shortness of breath may worsen and you may need to wear an oxygen device.  ? ?To help control other symptoms of COPD, the following treatments and lifestyle changes may be prescribed.  ?Quitting smoking  ?Avoiding cigarette smoke and other irritants  ?Taking medications including: ?a. bronchodilators ?b. anti-inflammatory agents ?c. oxygen ?d. antibiotics  ?Maintaining a healthy diet  ?Following a structured exercise program such as pulmonary rehabilitation ?Preventing respiratory infections  ?Controlling stress   ? ?If your COPD progresses, you may be eligible to be evaluated for lung volume reduction surgery or lung transplantation. You may also be eligible to participate in certain clinical trials (research studies). Ask your health care providers about studies being conducted in your hospital.  ? ?What is the outlook? ?Although COPD can not be cured, its symptoms can be treated and your quality of life can be improved. Your prognosis or outlook for the future will depend on how well your lungs are functioning, your symptoms, and how well you respond to and follow your treatment plan.  ? ?

## 2022-05-02 DIAGNOSIS — M8580 Other specified disorders of bone density and structure, unspecified site: Secondary | ICD-10-CM | POA: Diagnosis not present

## 2022-05-02 DIAGNOSIS — E039 Hypothyroidism, unspecified: Secondary | ICD-10-CM | POA: Diagnosis not present

## 2022-05-02 DIAGNOSIS — E7800 Pure hypercholesterolemia, unspecified: Secondary | ICD-10-CM | POA: Diagnosis not present

## 2022-05-06 DIAGNOSIS — J441 Chronic obstructive pulmonary disease with (acute) exacerbation: Secondary | ICD-10-CM | POA: Diagnosis not present

## 2022-05-06 DIAGNOSIS — E559 Vitamin D deficiency, unspecified: Secondary | ICD-10-CM | POA: Diagnosis not present

## 2022-05-06 DIAGNOSIS — F419 Anxiety disorder, unspecified: Secondary | ICD-10-CM | POA: Diagnosis not present

## 2022-05-06 DIAGNOSIS — I2510 Atherosclerotic heart disease of native coronary artery without angina pectoris: Secondary | ICD-10-CM | POA: Diagnosis not present

## 2022-05-06 DIAGNOSIS — Z8781 Personal history of (healed) traumatic fracture: Secondary | ICD-10-CM | POA: Diagnosis not present

## 2022-05-06 DIAGNOSIS — Z0000 Encounter for general adult medical examination without abnormal findings: Secondary | ICD-10-CM | POA: Diagnosis not present

## 2022-05-06 DIAGNOSIS — N1831 Chronic kidney disease, stage 3a: Secondary | ICD-10-CM | POA: Diagnosis not present

## 2022-05-06 DIAGNOSIS — R3129 Other microscopic hematuria: Secondary | ICD-10-CM | POA: Diagnosis not present

## 2022-05-06 DIAGNOSIS — D692 Other nonthrombocytopenic purpura: Secondary | ICD-10-CM | POA: Diagnosis not present

## 2022-05-06 DIAGNOSIS — K219 Gastro-esophageal reflux disease without esophagitis: Secondary | ICD-10-CM | POA: Diagnosis not present

## 2022-05-06 DIAGNOSIS — E039 Hypothyroidism, unspecified: Secondary | ICD-10-CM | POA: Diagnosis not present

## 2022-05-06 DIAGNOSIS — I700 Atherosclerosis of aorta: Secondary | ICD-10-CM | POA: Diagnosis not present

## 2022-05-09 ENCOUNTER — Ambulatory Visit (INDEPENDENT_AMBULATORY_CARE_PROVIDER_SITE_OTHER): Payer: Medicare Other | Admitting: Gastroenterology

## 2022-05-09 ENCOUNTER — Encounter: Payer: Self-pay | Admitting: Gastroenterology

## 2022-05-09 VITALS — BP 112/72 | HR 76 | Ht 70.0 in | Wt 212.4 lb

## 2022-05-09 DIAGNOSIS — K219 Gastro-esophageal reflux disease without esophagitis: Secondary | ICD-10-CM

## 2022-05-09 DIAGNOSIS — K582 Mixed irritable bowel syndrome: Secondary | ICD-10-CM

## 2022-05-09 DIAGNOSIS — Z8719 Personal history of other diseases of the digestive system: Secondary | ICD-10-CM | POA: Diagnosis not present

## 2022-05-09 MED ORDER — FAMOTIDINE 20 MG PO TABS: 20.0000 mg | ORAL_TABLET | Freq: Two times a day (BID) | ORAL | 6 refills | Status: DC

## 2022-05-09 NOTE — Patient Instructions (Signed)
We have sent the following medications to your pharmacy for you to pick up at your convenience:   Famotidine 20 mg ? ?Your Colonoscopy recall letter will be mailed out in 04/2027 ? ?If you are age 71 or older, your body mass index should be between 23-30. Your Body mass index is 30.48 kg/m?Marland Kitchen If this is out of the aforementioned range listed, please consider follow up with your Primary Care Provider. ? ?If you are age 42 or younger, your body mass index should be between 19-25. Your Body mass index is 30.48 kg/m?Marland Kitchen If this is out of the aformentioned range listed, please consider follow up with your Primary Care Provider.  ? ?________________________________________________________ ? ?The [facility] providers would like to encourage you to use [facility] to communicate with providers for non-urgent requests or questions.  Due to long hold times on the telephone, sending your provider a message by [facility] may be a faster and more efficient way to get a response.  Please allow 48 business hours for a response.  Please remember that this is for non-urgent requests.  ?_______________________________________________________  ? ?I appreciate the  opportunity to care for you ? ?Thank You  ? ?Harl Bowie , MD  ?

## 2022-05-09 NOTE — Progress Notes (Signed)
[name] [name]    [id number]    1951/10/03  Primary Care Physician:Pharr, Thayer Jew, MD  Referring Physician: Deland Pretty, [address] [place],  [place] [address]   Chief complaint:  IBS  HPI:  71 year old very pleasant female here for follow-up visit for IBS with irregular bowel habits.  Overall she is doing well and has no specific GI complaints.  She is no longer having fecal seepage or diarrhea. Denies any rectal bleeding or blood in stool. She had findings of nonspecific colitis on recent colonoscopy.  Biopsy's were negative for any active inflammation.  She had multiple polyps removed, possibly tissue cross-contamination between the jars and there was a serrated polyp noted in the jar with random biopsies.  Patient does not want to pursue any further colonoscopies  She experiencing occasional reflux symptoms.  Denies any vomiting, dysphagia or odynophagia. Colonoscopy April 24, 2022 - Three 4 to 7 mm polyps in the ascending colon, removed with a cold snare. Resected and retrieved. - A single non-bleeding colonic angioectasia. - Mucosal ulceration. Biopsied. - Diverticulosis in the sigmoid colon. - Non-bleeding external and internal hemorrhoids.  1. Surgical [P], colon nos, random sites SERRATED POLYP COMPATIBLE WITH SESSILE SERRATED ADENOMA, ONE FRAGMENT (SEE MICROSCOPIC COMMENT) HYPERPLASTIC POLYP, ONE FRAGMENT BENIGN COLONIC MUCOSA WITH NO DIAGNOSTIC ABNORMALITY, MULTIPLE FRAGMENTS 2. Surgical [P], colon, ascending, polyp (3) SESSILE SERRATED POLYP WITHOUT CYTOLOGIC DYSPLASIA TUBULAR ADENOMA LYMPHOID AGGREGATE NEGATIVE FOR HIGH-GRADE DYSPLASIA AND CARCINOMA  Colonoscopy August 22, 2016 - The perianal and digital rectal examinations were normal. - A 6 mm polyp was found in the ascending colon. The polyp was sessile. The polyp was removed with a cold snare. Resection and retrieval were complete. - A 3 mm polyp was found in the descending  colon. The polyp was sessile. The polyp was removed with a cold biopsy forceps. Resection and retrieval were complete. - Scattered small-mouthed diverticula were found in the sigmoid colon. - The exam was otherwise without abnormality.   Sessile serrated adenoma and hyperplastic polyp respectively     CT Chest 11/15/2021 1. No acute intrathoracic pathology. 2. Stable 6 mm right middle lobe nodule dating back to 10/06/2019. 3. Aortic Atherosclerosis (ICD10-I70.0).   Outpatient Encounter Medications as of 05/09/2022  Medication Sig   aspirin EC 81 MG tablet Take 81 mg by mouth daily. Swallow whole.   Cholecalciferol (VITAMIN D PO) Take 5,000 Units by mouth daily.    DULoxetine (CYMBALTA) 30 MG capsule Take 30 mg by mouth daily.   Polyethyl Glycol-Propyl Glycol (SYSTANE) 0.4-0.3 % SOLN Apply to eye.   umeclidinium-vilanterol (ANORO ELLIPTA) 62.5-25 MCG/ACT AEPB Inhale 1 puff into the lungs daily.   [DISCONTINUED] ipratropium (ATROVENT) 0.06 % nasal spray Place into both nostrils. (Patient not taking: Reported on 04/29/2022)   [DISCONTINUED] levocetirizine (XYZAL) 5 MG tablet Take 1 tablet by mouth daily as needed. (Patient not taking: Reported on 04/29/2022)   [DISCONTINUED] montelukast (SINGULAIR) 10 MG tablet Take 1 tablet (10 mg total) by mouth at bedtime. (Patient not taking: Reported on 04/29/2022)   No facility-administered encounter medications on file as of 05/09/2022.    Allergies as of 05/09/2022 - Review Complete 05/09/2022  Allergen Reaction Noted   Bupropion  01/31/2022   Cefaclor Diarrhea 10/17/2008   Elemental sulfur  07/06/2013   Latex Other (See Comments) and Rash 02/10/2012   Ofloxacin Diarrhea 10/17/2008    Past Medical History:  Diagnosis Date   Adenomatous colon polyp  Allergy    Anxiety    Cancer ([place])    skin   Cataract    bilateral   COPD (chronic obstructive pulmonary disease) (HCC)    Depression    Diverticulosis    Endometriosis    GERD  (gastroesophageal reflux disease)    Glaucoma    Hiatal hernia    IBS (irritable bowel syndrome)    Inguinal hernia    Pneumothorax     Past Surgical History:  Procedure Laterality Date   ABDOMINAL HYSTERECTOMY     COLONOSCOPY     PLEURAL SCARIFICATION     ruptured disk     torn tendon     TUBAL LIGATION      Family History  Problem Relation Age of Onset   Heart failure Mother    Osteoporosis Sister    Crohn's disease Sister    Neuropathy Sister    Thyroid disease Sister    Lung disease Sister    Colon polyps Sister    Thyroid cancer Maternal Grandmother    Colon cancer Neg Hx    Breast cancer Neg Hx    Esophageal cancer Neg Hx    Pancreatic cancer Neg Hx    Stomach cancer Neg Hx     Social History   Socioeconomic History   Marital status: Married    Spouse name: Not on file   Number of children: 2   Years of education: Not on file   Highest education level: Not on file  Occupational History   Occupation: locksmith  Tobacco Use   Smoking status: Former    Packs/day: 1.50    Years: 40.00    Pack years: 60.00    Types: Cigarettes    Start date: 12/31/1971    Quit date: 02/10/2012    Years since quitting: 10.2   Smokeless tobacco: Never  Vaping Use   Vaping Use: Never used  Substance and Sexual Activity   Alcohol use: No   Drug use: No   Sexual activity: Never  Other Topics Concern   Not on file  Social History Narrative   Not on file   Social Determinants of Health   Financial Resource Strain: Not on file  Food Insecurity: Not on file  Transportation Needs: Not on file  Physical Activity: Not on file  Stress: Not on file  Social Connections: Not on file  Intimate Partner Violence: Not on file      Review of systems: All other review of systems negative except as mentioned in the HPI.   Physical Exam: Vitals:   05/09/22 0907  BP: 112/72  Pulse: 76   Body mass index is 30.48 kg/m. Gen:      No acute distress HEENT:  sclera  anicteric Abd:      soft, non-tender; no palpable masses, no distension Ext:    No edema Neuro: alert and oriented x 3 Psych: normal mood and affect  Data Reviewed:  Reviewed labs, radiology imaging, old records and pertinent past GI work up   Assessment and Plan/Recommendations:  71 year old very pleasant female with history of chronic irritable bowel syndrome with alternating constipation and diarrhea  She has history of multiple adenomatous colon polyps Nonspecific mucosal ulceration, biopsies negative for any acute or chronic inflammation.  Possible colonic superficial mucosal ulceration could be secondary to ischemic colitis in the setting of dehydration from bowel prep Overall patient is doing well, will hold off starting any medications at this point  She does not want to pursue  any more colonoscopies.  Will rediscuss at at follow-up visit when she is closer to her recall surveillance in May 2028  Use famotidine 20 mg twice daily as needed for reflux symptoms Limit antireflux measures and lifestyle modifications  Interval follow-up as needed   The patient was provided an opportunity to ask questions and all were answered. The patient agreed with the plan and demonstrated an understanding of the instructions.  Damaris Hippo , MD    CC: Deland Pretty, MD

## 2022-05-16 ENCOUNTER — Encounter: Payer: Self-pay | Admitting: Gastroenterology

## 2022-05-21 DIAGNOSIS — M5030 Other cervical disc degeneration, unspecified cervical region: Secondary | ICD-10-CM | POA: Diagnosis not present

## 2022-05-21 DIAGNOSIS — M19012 Primary osteoarthritis, left shoulder: Secondary | ICD-10-CM | POA: Diagnosis not present

## 2022-05-22 ENCOUNTER — Ambulatory Visit: Payer: Medicare Other

## 2022-05-30 ENCOUNTER — Ambulatory Visit
Admission: RE | Admit: 2022-05-30 | Discharge: 2022-05-30 | Disposition: A | Discharge status: Home / Self Care | Payer: Medicare Other | Source: Ambulatory Visit | Attending: Internal Medicine | Admitting: Internal Medicine

## 2022-05-30 DIAGNOSIS — Z1231 Encounter for screening mammogram for malignant neoplasm of breast: Secondary | ICD-10-CM

## 2022-06-03 ENCOUNTER — Other Ambulatory Visit: Payer: Self-pay | Admitting: Internal Medicine

## 2022-06-03 DIAGNOSIS — R928 Other abnormal and inconclusive findings on diagnostic imaging of breast: Secondary | ICD-10-CM

## 2022-06-04 DIAGNOSIS — T466X5A Adverse effect of antihyperlipidemic and antiarteriosclerotic drugs, initial encounter: Secondary | ICD-10-CM | POA: Diagnosis not present

## 2022-06-04 DIAGNOSIS — E559 Vitamin D deficiency, unspecified: Secondary | ICD-10-CM | POA: Diagnosis not present

## 2022-06-04 DIAGNOSIS — I2510 Atherosclerotic heart disease of native coronary artery without angina pectoris: Secondary | ICD-10-CM | POA: Diagnosis not present

## 2022-06-04 DIAGNOSIS — R9389 Abnormal findings on diagnostic imaging of other specified body structures: Secondary | ICD-10-CM | POA: Diagnosis not present

## 2022-06-04 DIAGNOSIS — E7800 Pure hypercholesterolemia, unspecified: Secondary | ICD-10-CM | POA: Diagnosis not present

## 2022-06-04 DIAGNOSIS — S22080G Wedge compression fracture of T11-T12 vertebra, subsequent encounter for fracture with delayed healing: Secondary | ICD-10-CM | POA: Diagnosis not present

## 2022-06-04 DIAGNOSIS — M8000XD Age-related osteoporosis with current pathological fracture, unspecified site, subsequent encounter for fracture with routine healing: Secondary | ICD-10-CM | POA: Diagnosis not present

## 2022-06-04 DIAGNOSIS — M791 Myalgia, unspecified site: Secondary | ICD-10-CM | POA: Diagnosis not present

## 2022-06-04 DIAGNOSIS — M810 Age-related osteoporosis without current pathological fracture: Secondary | ICD-10-CM | POA: Diagnosis not present

## 2022-06-10 ENCOUNTER — Ambulatory Visit
Admission: RE | Admit: 2022-06-10 | Discharge: 2022-06-10 | Disposition: A | Discharge status: Home / Self Care | Payer: Medicare Other | Source: Ambulatory Visit | Attending: Internal Medicine | Admitting: Internal Medicine

## 2022-06-10 DIAGNOSIS — N6001 Solitary cyst of right breast: Secondary | ICD-10-CM | POA: Diagnosis not present

## 2022-06-10 DIAGNOSIS — R928 Other abnormal and inconclusive findings on diagnostic imaging of breast: Secondary | ICD-10-CM | POA: Diagnosis not present

## 2022-06-11 DIAGNOSIS — M542 Cervicalgia: Secondary | ICD-10-CM | POA: Diagnosis not present

## 2022-06-11 DIAGNOSIS — M5030 Other cervical disc degeneration, unspecified cervical region: Secondary | ICD-10-CM | POA: Diagnosis not present

## 2022-06-12 DIAGNOSIS — M5030 Other cervical disc degeneration, unspecified cervical region: Secondary | ICD-10-CM | POA: Diagnosis not present

## 2022-06-14 ENCOUNTER — Telehealth: Payer: Self-pay | Admitting: Pharmacy Technician

## 2022-06-14 ENCOUNTER — Other Ambulatory Visit: Payer: Self-pay | Admitting: Pharmacy Technician

## 2022-06-14 DIAGNOSIS — T466X5A Adverse effect of antihyperlipidemic and antiarteriosclerotic drugs, initial encounter: Secondary | ICD-10-CM | POA: Insufficient documentation

## 2022-06-14 DIAGNOSIS — I2510 Atherosclerotic heart disease of native coronary artery without angina pectoris: Secondary | ICD-10-CM | POA: Insufficient documentation

## 2022-06-14 DIAGNOSIS — E7800 Pure hypercholesterolemia, unspecified: Secondary | ICD-10-CM

## 2022-06-14 NOTE — Telephone Encounter (Addendum)
Auth Submission: No auth required Payer: Medicare , BCBS Supplement Medication & CPT/J Code(s) submitted: [place] Units/visits requested: 2 visits Approval from: 06/14/22 to 12/29/22 at [facility] [place]   Patient will be schedule at the earliest availability  Medicare and supplement coverage reviewed and auth extended to 12/30/23

## 2022-06-24 ENCOUNTER — Ambulatory Visit (INDEPENDENT_AMBULATORY_CARE_PROVIDER_SITE_OTHER): Payer: Medicare Other

## 2022-06-24 VITALS — BP 117/74 | HR 65 | Temp 98.3°F | Resp 16 | Ht 69.5 in | Wt 210.6 lb

## 2022-06-24 DIAGNOSIS — E7800 Pure hypercholesterolemia, unspecified: Secondary | ICD-10-CM

## 2022-06-24 DIAGNOSIS — E786 Lipoprotein deficiency: Secondary | ICD-10-CM | POA: Diagnosis not present

## 2022-06-24 DIAGNOSIS — T466X5A Adverse effect of antihyperlipidemic and antiarteriosclerotic drugs, initial encounter: Secondary | ICD-10-CM

## 2022-06-24 DIAGNOSIS — I2510 Atherosclerotic heart disease of native coronary artery without angina pectoris: Secondary | ICD-10-CM

## 2022-06-24 MED ORDER — INCLISIRAN SODIUM 284 MG/1.5ML ~~LOC~~ SOSY
284.0000 mg | PREFILLED_SYRINGE | Freq: Once | SUBCUTANEOUS | Status: AC
  Administered 2022-06-24: 284 mg via SUBCUTANEOUS

## 2022-07-03 DIAGNOSIS — M810 Age-related osteoporosis without current pathological fracture: Secondary | ICD-10-CM | POA: Diagnosis not present

## 2022-08-08 ENCOUNTER — Other Ambulatory Visit: Payer: Self-pay | Admitting: Pharmacy Technician

## 2022-08-13 DIAGNOSIS — M542 Cervicalgia: Secondary | ICD-10-CM | POA: Diagnosis not present

## 2022-08-13 DIAGNOSIS — Z6841 Body Mass Index (BMI) 40.0 and over, adult: Secondary | ICD-10-CM | POA: Diagnosis not present

## 2022-08-13 DIAGNOSIS — M4722 Other spondylosis with radiculopathy, cervical region: Secondary | ICD-10-CM | POA: Diagnosis not present

## 2022-08-13 DIAGNOSIS — M4712 Other spondylosis with myelopathy, cervical region: Secondary | ICD-10-CM | POA: Diagnosis not present

## 2022-08-16 ENCOUNTER — Other Ambulatory Visit: Payer: Self-pay | Admitting: Neurosurgery

## 2022-09-03 ENCOUNTER — Other Ambulatory Visit: Payer: Self-pay | Admitting: Neurosurgery

## 2022-09-04 ENCOUNTER — Ambulatory Visit (INDEPENDENT_AMBULATORY_CARE_PROVIDER_SITE_OTHER): Payer: Medicare Other | Admitting: Internal Medicine

## 2022-09-04 ENCOUNTER — Encounter: Payer: Self-pay | Admitting: Internal Medicine

## 2022-09-04 VITALS — BP 118/68 | HR 89 | Ht 69.5 in | Wt 212.2 lb

## 2022-09-04 DIAGNOSIS — J301 Allergic rhinitis due to pollen: Secondary | ICD-10-CM

## 2022-09-04 DIAGNOSIS — J410 Simple chronic bronchitis: Secondary | ICD-10-CM | POA: Diagnosis not present

## 2022-09-04 NOTE — Patient Instructions (Addendum)
Please schedule follow up scheduled with myself in 6 months.  If my schedule is not open yet, we will contact you with a reminder closer to that time. Please call [phone] if you haven't heard from [place] a month before.    Glad your breathing and coughing are doing well! If your allergy symptoms and cough worsen, start taking singulair and xyzal again.  If your breathing worsens in the interim please let us know sooner - I may recommend you go back on a maintenance inhaler or at least the rescue.

## 2022-09-04 NOTE — Progress Notes (Signed)
[name]    [id number]    1951-06-01  Primary Care Physician:Pharr, Thayer Jew, MD Date of Appointment: 09/04/2022 Established Patient Visit  Chief complaint:   Chief Complaint  Patient presents with   Follow-up    Pt f/u doing well, hasn't been using inhalers because she feels like they aren't needed    HPI: [name] [name] is a 71 y.o. woman with history of tobacco use and rhinitis as well as pulmonary nodule.   Interval Updates: Here for follow up for COPD. Hasn't used maintenance inhalers. Never needed them.   No prednisone or abx.   Not needing any anti-histamines. Cough resolved as spring passed. She feels spring and fall are her worst seasons for coughing related to allergies.   She is having neck surgery in the next few weeks for DJD.   I have reviewed the patient's family social and past medical history and updated as appropriate.   Past Medical History:  Diagnosis Date   Adenomatous colon polyp    Allergy    Anxiety    Cancer ([place])    skin   Cataract    bilateral   COPD (chronic obstructive pulmonary disease) (HCC)    Depression    Diverticulosis    Endometriosis    GERD (gastroesophageal reflux disease)    Glaucoma    Hiatal hernia    IBS (irritable bowel syndrome)    Inguinal hernia    Pneumothorax     Past Surgical History:  Procedure Laterality Date   ABDOMINAL HYSTERECTOMY     COLONOSCOPY     PLEURAL SCARIFICATION     ruptured disk     torn tendon     TUBAL LIGATION      Family History  Problem Relation Age of Onset   Heart failure Mother    Osteoporosis Sister    Crohn's disease Sister    Neuropathy Sister    Thyroid disease Sister    Lung disease Sister    Colon polyps Sister    Thyroid cancer Maternal Grandmother    Colon cancer Neg Hx    Breast cancer Neg Hx    Esophageal cancer Neg Hx    Pancreatic cancer Neg Hx    Stomach cancer Neg Hx     Social History   Occupational History   Occupation: locksmith   Tobacco Use   Smoking status: Former    Packs/day: 1.50    Years: 40.00    Total pack years: 60.00    Types: Cigarettes    Start date: 12/31/1971    Quit date: 02/10/2012    Years since quitting: 10.5   Smokeless tobacco: Never  Vaping Use   Vaping Use: Never used  Substance and Sexual Activity   Alcohol use: No   Drug use: No   Sexual activity: Never     Physical Exam: Blood pressure 118/68, pulse 89, height 5' 9.5" (1.765 m), weight 212 lb 3.2 oz (96.3 kg), SpO2 94 %.  Gen:     NAD Lungs:   CTAB no wheezes or crackles CV:         RRR Ext: venous varicosities bilaterally   Data Reviewed: Imaging: I have personally reviewed the CT Chest from Oct 2022 with stable 12m pulmonary nodule.   PFTs: None on file.  Labs: Lab Results  Component Value Date   WBC 6.9 01/27/2019   HGB 14.3 01/27/2019   HCT 43.2 01/27/2019   MCV 91.2 01/27/2019  PLT 238.0 01/27/2019   Lab Results  Component Value Date   NA 142 10/20/2017   K 4.7 10/20/2017   CL 106 10/20/2017   CO2 28 10/20/2017    Immunization status: Immunization History  Administered Date(s) Administered   Influenza, High Dose Seasonal PF 08/28/2016, 10/16/2018, 09/16/2019, 09/18/2020   Influenza, Quadrivalent, Recombinant, Inj, Pf 10/10/2017, 10/16/2018, 09/15/2020, 10/02/2021   Influenza, Seasonal, Injecte, Preservative Fre 09/13/2014   Influenza-Unspecified 09/22/2012, 09/29/2018, 09/15/2020, 10/02/2021   PFIZER(Purple Top)SARS-COV-2 Vaccination 01/21/2020, 02/11/2020   Pneumococcal Conjugate-13 03/20/2017   Pneumococcal Polysaccharide-23 03/30/2018   Td 12/26/2014   Zoster Recombinat (Shingrix) 09/22/2019   Zoster, Live 03/01/2015    Assessment:  COPD moderate FEV1 65% of predicted, well controlled off therapies.  Chronic Cough - seasonal allergic rhinitis.  Solitary pulmonary nodule 14m in the RML - stable since 2020. No further follow up needed.  GERD   Plan/Recommendations: Symptoms doing well  off all therapies.  If your allergy symptoms and cough worsen, start taking singulair and xyzal again.  If your breathing worsens in the interim please let [place] sooner - I may recommend you go back on a maintenance inhaler or at least the rescue.    No pre-op evaluation needed for her upcoming surgery.   Return to Care: Return in about 6 months (around 03/05/2023).   NLenice Llamas MD [facility] [place]

## 2022-09-13 NOTE — Pre-Procedure Instructions (Signed)
Surgical Instructions    Your procedure is scheduled on September 23, 2022.  Report to [facility] Main Entrance "A" at 5:30 A.M., then check in with the Admitting office.  Call this number if you have problems the morning of surgery:  [phone]   If you have any questions prior to your surgery date call [phone]: Open Monday-Friday 8am-4pm    Remember:  Do not eat or drink after midnight the night before your surgery      Take these medicines the morning of surgery with A SIP OF WATER:  famotidine (PEPCID) - may take as needed  Polyethyl Glycol-Propyl Glycol (SYSTANE) - may take as needed  umeclidinium-vilanterol (ANORO ELLIPTA) - may take as needed     Follow your surgeon's instructions on when to stop Aspirin.  If no instructions were given by your surgeon then you will need to call the office to get those instructions.     As of today, STOP taking any Aleve, Naproxen, Ibuprofen, Motrin, Advil, Goody's, BC's, all herbal medications, fish oil, and all vitamins.                     Do NOT Smoke (Tobacco/Vaping) for 24 hours prior to your procedure.  If you use a CPAP at night, you may bring your mask/headgear for your overnight stay.   Contacts, glasses, piercing's, hearing aid's, dentures or partials may not be worn into surgery, please bring cases for these belongings.    For patients admitted to the hospital, discharge time will be determined by your treatment team.   Patients discharged the day of surgery will not be allowed to drive home, and someone needs to stay with them for 24 hours.  SURGICAL WAITING ROOM VISITATION Patients having surgery or a procedure may have no more than 2 support people in the waiting area - these visitors may rotate.   Children under the age of 7 must have an adult with them who is not the patient. If the patient needs to stay at the hospital during part of their recovery, the visitor guidelines for inpatient rooms apply. Pre-op  nurse will coordinate an appropriate time for 1 support person to accompany patient in pre-op.  This support person may not rotate.   Please refer to the [facility] website for the visitor guidelines for Inpatients (after your surgery is over and you are in a regular room).    Special instructions:   [facility]- Preparing For Surgery  Before surgery, you can play an important role. Because skin is not sterile, your skin needs to be as free of germs as possible. You can reduce the number of germs on your skin by washing with CHG (chlorahexidine gluconate) Soap before surgery.  CHG is an antiseptic cleaner which kills germs and bonds with the skin to continue killing germs even after washing.    Oral Hygiene is also important to reduce your risk of infection.  Remember - BRUSH YOUR TEETH THE MORNING OF SURGERY WITH YOUR REGULAR TOOTHPASTE  Please do not use if you have an allergy to CHG or antibacterial soaps. If your skin becomes reddened/irritated stop using the CHG.  Do not shave (including legs and underarms) for at least 48 hours prior to first CHG shower. It is OK to shave your face.  Please follow these instructions carefully.   Shower the NIGHT BEFORE SURGERY and the MORNING OF SURGERY  If you chose to wash your hair, wash your hair first as usual with  your normal shampoo.  After you shampoo, rinse your hair and body thoroughly to remove the shampoo.  Use CHG Soap as you would any other liquid soap. You can apply CHG directly to the skin and wash gently with a scrungie or a clean washcloth.   Apply the CHG Soap to your body ONLY FROM THE NECK DOWN.  Do not use on open wounds or open sores. Avoid contact with your eyes, ears, mouth and genitals (private parts). Wash Face and genitals (private parts)  with your normal soap.   Wash thoroughly, paying special attention to the area where your surgery will be performed.  Thoroughly rinse your body with warm water from the neck  down.  DO NOT shower/wash with your normal soap after using and rinsing off the CHG Soap.  Pat yourself dry with a CLEAN TOWEL.  Wear CLEAN PAJAMAS to bed the night before surgery  Place CLEAN SHEETS on your bed the night before your surgery  DO NOT SLEEP WITH PETS.   Day of Surgery: Take a shower with CHG soap. Do not wear jewelry or makeup Do not wear lotions, powders, perfumes/colognes, or deodorant. Do not shave 48 hours prior to surgery.   Do not bring valuables to the hospital.  [facility] is not responsible for any belongings or valuables. Do not wear nail polish, gel polish, artificial nails, or any other type of covering on natural nails (fingers and toes) If you have artificial nails or gel coating that need to be removed by a nail salon, please have this removed prior to surgery. Artificial nails or gel coating may interfere with anesthesia's ability to adequately monitor your vital signs.  Wear Clean/Comfortable clothing the morning of surgery Remember to brush your teeth WITH YOUR REGULAR TOOTHPASTE.   Please read over the following fact sheets that you were given.    If you received a COVID test during your pre-op visit  it is requested that you wear a mask when out in public, stay away from anyone that may not be feeling well and notify your surgeon if you develop symptoms. If you have been in contact with anyone that has tested positive in the last 10 days please notify you surgeon.

## 2022-09-16 ENCOUNTER — Other Ambulatory Visit: Payer: Self-pay

## 2022-09-16 ENCOUNTER — Encounter (HOSPITAL_COMMUNITY): Payer: Self-pay

## 2022-09-16 ENCOUNTER — Encounter (HOSPITAL_COMMUNITY)
Admission: RE | Admit: 2022-09-16 | Discharge: 2022-09-16 | Disposition: A | Discharge status: Home / Self Care | Payer: Medicare Other | Source: Ambulatory Visit | Attending: Neurosurgery | Admitting: Neurosurgery

## 2022-09-16 VITALS — BP 147/84 | HR 70 | Temp 98.4°F | Resp 18 | Ht 69.5 in | Wt 213.4 lb

## 2022-09-16 DIAGNOSIS — Z01812 Encounter for preprocedural laboratory examination: Secondary | ICD-10-CM | POA: Diagnosis not present

## 2022-09-16 DIAGNOSIS — Z01818 Encounter for other preprocedural examination: Secondary | ICD-10-CM

## 2022-09-16 HISTORY — DX: Other specified postprocedural states: Z98.890

## 2022-09-16 HISTORY — DX: Nausea with vomiting, unspecified: R11.2

## 2022-09-16 HISTORY — DX: Unspecified osteoarthritis, unspecified site: M19.90

## 2022-09-16 HISTORY — DX: Headache, unspecified: R51.9

## 2022-09-16 LAB — CBC
HCT: 43.4 % (ref 36.0–46.0)
Hemoglobin: 14.3 g/dL (ref 12.0–15.0)
MCH: 31.1 pg (ref 26.0–34.0)
MCHC: 32.9 g/dL (ref 30.0–36.0)
MCV: 94.3 fL (ref 80.0–100.0)
Platelets: 291 10*3/uL (ref 150–400)
RBC: 4.6 MIL/uL (ref 3.87–5.11)
RDW: 15.1 % (ref 11.5–15.5)
WBC: 6 10*3/uL (ref 4.0–10.5)
nRBC: 0 % (ref 0.0–0.2)

## 2022-09-16 LAB — SURGICAL PCR SCREEN
MRSA, PCR: NEGATIVE
Staphylococcus aureus: NEGATIVE

## 2022-09-16 NOTE — Progress Notes (Signed)
PCP - Dr. Deland Pretty Cardiologist - Denies  PPM/ICD - Denies Device Orders - n/a Rep Notified - n/a  Chest x-ray - 08/23/2008 EKG - Denies Stress Test - 05/29/2012 ECHO - 05/21/2012 Cardiac Cath - Denies  Sleep Study - Denies CPAP - n/a  No DM  Blood Thinner Instructions: n/a Aspirin Instructions: Per surgeon's instructions, pts last dose of ASA will be 09/17/22  NPO after midnight  COVID TEST- n/a   Anesthesia review: No.  Patient denies shortness of breath, fever, cough and chest pain at PAT appointment   All instructions explained to the patient, with a verbal understanding of the material. Patient agrees to go over the instructions while at home for a better understanding. Patient also instructed to self quarantine after being tested for COVID-19. The opportunity to ask questions was provided.

## 2022-09-23 ENCOUNTER — Other Ambulatory Visit: Payer: Self-pay

## 2022-09-23 ENCOUNTER — Inpatient Hospital Stay (HOSPITAL_COMMUNITY)
Admission: RE | Admit: 2022-09-23 | Discharge: 2022-09-23 | DRG: 473 | Disposition: A | Discharge status: Home / Self Care | Payer: Medicare Other | Attending: Neurosurgery | Admitting: Neurosurgery

## 2022-09-23 ENCOUNTER — Inpatient Hospital Stay (HOSPITAL_COMMUNITY): Payer: Medicare Other | Admitting: Certified Registered Nurse Anesthetist

## 2022-09-23 ENCOUNTER — Inpatient Hospital Stay (HOSPITAL_COMMUNITY): Payer: Medicare Other | Admitting: Physician Assistant

## 2022-09-23 ENCOUNTER — Encounter (HOSPITAL_COMMUNITY)
Admission: RE | Disposition: A | Discharge status: Home / Self Care | Payer: Self-pay | Source: Home / Self Care | Attending: Neurosurgery

## 2022-09-23 ENCOUNTER — Inpatient Hospital Stay (HOSPITAL_COMMUNITY): Payer: Medicare Other

## 2022-09-23 ENCOUNTER — Encounter (HOSPITAL_COMMUNITY): Payer: Self-pay | Admitting: Neurosurgery

## 2022-09-23 DIAGNOSIS — I2510 Atherosclerotic heart disease of native coronary artery without angina pectoris: Secondary | ICD-10-CM | POA: Diagnosis present

## 2022-09-23 DIAGNOSIS — M4722 Other spondylosis with radiculopathy, cervical region: Principal | ICD-10-CM | POA: Diagnosis present

## 2022-09-23 DIAGNOSIS — J449 Chronic obstructive pulmonary disease, unspecified: Secondary | ICD-10-CM | POA: Diagnosis present

## 2022-09-23 DIAGNOSIS — Z888 Allergy status to other drugs, medicaments and biological substances status: Secondary | ICD-10-CM | POA: Diagnosis not present

## 2022-09-23 DIAGNOSIS — Z7951 Long term (current) use of inhaled steroids: Secondary | ICD-10-CM | POA: Diagnosis not present

## 2022-09-23 DIAGNOSIS — E669 Obesity, unspecified: Secondary | ICD-10-CM | POA: Diagnosis present

## 2022-09-23 DIAGNOSIS — Z8262 Family history of osteoporosis: Secondary | ICD-10-CM

## 2022-09-23 DIAGNOSIS — F419 Anxiety disorder, unspecified: Secondary | ICD-10-CM | POA: Diagnosis not present

## 2022-09-23 DIAGNOSIS — M4802 Spinal stenosis, cervical region: Secondary | ICD-10-CM | POA: Diagnosis not present

## 2022-09-23 DIAGNOSIS — Z882 Allergy status to sulfonamides status: Secondary | ICD-10-CM | POA: Diagnosis not present

## 2022-09-23 DIAGNOSIS — Z91040 Latex allergy status: Secondary | ICD-10-CM | POA: Diagnosis not present

## 2022-09-23 DIAGNOSIS — Z808 Family history of malignant neoplasm of other organs or systems: Secondary | ICD-10-CM | POA: Diagnosis not present

## 2022-09-23 DIAGNOSIS — G43909 Migraine, unspecified, not intractable, without status migrainosus: Secondary | ICD-10-CM | POA: Diagnosis present

## 2022-09-23 DIAGNOSIS — Z86010 Personal history of colonic polyps: Secondary | ICD-10-CM | POA: Diagnosis not present

## 2022-09-23 DIAGNOSIS — K219 Gastro-esophageal reflux disease without esophagitis: Secondary | ICD-10-CM | POA: Diagnosis present

## 2022-09-23 DIAGNOSIS — F32A Depression, unspecified: Secondary | ICD-10-CM | POA: Diagnosis present

## 2022-09-23 DIAGNOSIS — Z87891 Personal history of nicotine dependence: Secondary | ICD-10-CM | POA: Diagnosis not present

## 2022-09-23 DIAGNOSIS — Z8249 Family history of ischemic heart disease and other diseases of the circulatory system: Secondary | ICD-10-CM

## 2022-09-23 DIAGNOSIS — Z7982 Long term (current) use of aspirin: Secondary | ICD-10-CM | POA: Diagnosis not present

## 2022-09-23 DIAGNOSIS — Z8371 Family history of colonic polyps: Secondary | ICD-10-CM | POA: Diagnosis not present

## 2022-09-23 DIAGNOSIS — Z8349 Family history of other endocrine, nutritional and metabolic diseases: Secondary | ICD-10-CM | POA: Diagnosis not present

## 2022-09-23 DIAGNOSIS — Z981 Arthrodesis status: Secondary | ICD-10-CM | POA: Diagnosis not present

## 2022-09-23 DIAGNOSIS — Z6831 Body mass index (BMI) 31.0-31.9, adult: Secondary | ICD-10-CM | POA: Diagnosis not present

## 2022-09-23 HISTORY — PX: ANTERIOR CERVICAL DECOMP/DISCECTOMY FUSION: SHX1161

## 2022-09-23 SURGERY — ANTERIOR CERVICAL DECOMPRESSION/DISCECTOMY FUSION 1 LEVEL/HARDWARE REMOVAL
Anesthesia: General | Site: Spine Cervical

## 2022-09-23 MED ORDER — OXYCODONE HCL 5 MG PO TABS: 5.0000 mg | ORAL_TABLET | ORAL | Status: DC | PRN

## 2022-09-23 MED ORDER — ZOLPIDEM TARTRATE 5 MG PO TABS: 5.0000 mg | ORAL_TABLET | Freq: Every evening | ORAL | Status: DC | PRN

## 2022-09-23 MED ORDER — OXYCODONE HCL 5 MG PO TABS: 5.0000 mg | ORAL_TABLET | Freq: Once | ORAL | Status: DC | PRN

## 2022-09-23 MED ORDER — DEXAMETHASONE 4 MG PO TABS
4.0000 mg | ORAL_TABLET | Freq: Four times a day (QID) | ORAL | Status: DC
  Filled 2022-09-23: qty 1

## 2022-09-23 MED ORDER — ALUM & MAG HYDROXIDE-SIMETH 200-200-20 MG/5ML PO SUSP: 30.0000 mL | Freq: Four times a day (QID) | ORAL | Status: DC | PRN

## 2022-09-23 MED ORDER — METHOCARBAMOL 750 MG PO TABS: 750.0000 mg | ORAL_TABLET | Freq: Four times a day (QID) | ORAL | 0 refills | Status: DC

## 2022-09-23 MED ORDER — ACETAMINOPHEN 10 MG/ML IV SOLN: 1000.0000 mg | Freq: Once | INTRAVENOUS | Status: DC | PRN

## 2022-09-23 MED ORDER — LACTATED RINGERS IV SOLN: INTRAVENOUS | Status: DC | PRN

## 2022-09-23 MED ORDER — MORPHINE SULFATE (PF) 4 MG/ML IV SOLN: 4.0000 mg | INTRAVENOUS | Status: DC | PRN

## 2022-09-23 MED ORDER — FENTANYL CITRATE (PF) 250 MCG/5ML IJ SOLN
INTRAMUSCULAR | Status: AC
  Filled 2022-09-23: qty 5

## 2022-09-23 MED ORDER — ACETAMINOPHEN 325 MG PO TABS: 650.0000 mg | ORAL_TABLET | ORAL | Status: DC | PRN

## 2022-09-23 MED ORDER — FAMOTIDINE 20 MG PO TABS: 20.0000 mg | ORAL_TABLET | Freq: Every day | ORAL | Status: DC | PRN

## 2022-09-23 MED ORDER — SUGAMMADEX SODIUM 200 MG/2ML IV SOLN
INTRAVENOUS | Status: DC | PRN
  Administered 2022-09-23: 200 mg via INTRAVENOUS

## 2022-09-23 MED ORDER — HYDROMORPHONE HCL 1 MG/ML IJ SOLN
INTRAMUSCULAR | Status: AC
  Filled 2022-09-23: qty 1

## 2022-09-23 MED ORDER — ACETAMINOPHEN 650 MG RE SUPP: 650.0000 mg | RECTAL | Status: DC | PRN

## 2022-09-23 MED ORDER — PROPOFOL 10 MG/ML IV BOLUS
INTRAVENOUS | Status: AC
  Filled 2022-09-23: qty 20

## 2022-09-23 MED ORDER — AMISULPRIDE (ANTIEMETIC) 5 MG/2ML IV SOLN
INTRAVENOUS | Status: AC
  Filled 2022-09-23: qty 4

## 2022-09-23 MED ORDER — DEXAMETHASONE SODIUM PHOSPHATE 4 MG/ML IJ SOLN
4.0000 mg | Freq: Four times a day (QID) | INTRAMUSCULAR | Status: DC
  Administered 2022-09-23: 4 mg via INTRAVENOUS
  Filled 2022-09-23: qty 1

## 2022-09-23 MED ORDER — PHENYLEPHRINE 80 MCG/ML (10ML) SYRINGE FOR IV PUSH (FOR BLOOD PRESSURE SUPPORT)
PREFILLED_SYRINGE | INTRAVENOUS | Status: DC | PRN
  Administered 2022-09-23 (×3): 160 ug via INTRAVENOUS

## 2022-09-23 MED ORDER — DOCUSATE SODIUM 100 MG PO CAPS
100.0000 mg | ORAL_CAPSULE | Freq: Two times a day (BID) | ORAL | Status: DC
  Filled 2022-09-23: qty 1

## 2022-09-23 MED ORDER — ONDANSETRON HCL 4 MG/2ML IJ SOLN
INTRAMUSCULAR | Status: AC
  Filled 2022-09-23: qty 2

## 2022-09-23 MED ORDER — ONDANSETRON HCL 4 MG/2ML IJ SOLN: 4.0000 mg | Freq: Four times a day (QID) | INTRAMUSCULAR | Status: DC | PRN

## 2022-09-23 MED ORDER — ORAL CARE MOUTH RINSE: 15.0000 mL | Freq: Once | OROMUCOSAL | Status: AC

## 2022-09-23 MED ORDER — VITAMIN D 25 MCG (1000 UNIT) PO TABS: 5000.0000 [IU] | ORAL_TABLET | Freq: Every day | ORAL | Status: DC

## 2022-09-23 MED ORDER — BACITRACIN ZINC 500 UNIT/GM EX OINT
TOPICAL_OINTMENT | CUTANEOUS | Status: AC
  Filled 2022-09-23: qty 28.35

## 2022-09-23 MED ORDER — 0.9 % SODIUM CHLORIDE (POUR BTL) OPTIME
TOPICAL | Status: DC | PRN
  Administered 2022-09-23: 1000 mL

## 2022-09-23 MED ORDER — OXYCODONE HCL 5 MG PO TABS: 10.0000 mg | ORAL_TABLET | ORAL | Status: DC | PRN

## 2022-09-23 MED ORDER — THROMBIN 5000 UNITS EX SOLR
CUTANEOUS | Status: AC
  Filled 2022-09-23: qty 5000

## 2022-09-23 MED ORDER — CYCLOBENZAPRINE HCL 10 MG PO TABS: 10.0000 mg | ORAL_TABLET | Freq: Three times a day (TID) | ORAL | Status: DC | PRN

## 2022-09-23 MED ORDER — PROMETHAZINE HCL 12.5 MG RE SUPP: 12.5000 mg | Freq: Four times a day (QID) | RECTAL | Status: DC | PRN

## 2022-09-23 MED ORDER — CHLORHEXIDINE GLUCONATE 0.12 % MT SOLN
15.0000 mL | Freq: Once | OROMUCOSAL | Status: AC
  Administered 2022-09-23: 15 mL via OROMUCOSAL
  Filled 2022-09-23: qty 15

## 2022-09-23 MED ORDER — CHLORHEXIDINE GLUCONATE CLOTH 2 % EX PADS: 6.0000 | MEDICATED_PAD | Freq: Once | CUTANEOUS | Status: DC

## 2022-09-23 MED ORDER — VANCOMYCIN HCL 1500 MG/300ML IV SOLN
1500.0000 mg | Freq: Once | INTRAVENOUS | Status: DC
  Filled 2022-09-23: qty 300

## 2022-09-23 MED ORDER — PROPOFOL 10 MG/ML IV BOLUS
INTRAVENOUS | Status: DC | PRN
  Administered 2022-09-23: 130 mg via INTRAVENOUS

## 2022-09-23 MED ORDER — PHENYLEPHRINE HCL-NACL 20-0.9 MG/250ML-% IV SOLN
INTRAVENOUS | Status: DC | PRN
  Administered 2022-09-23: 25 ug/min via INTRAVENOUS

## 2022-09-23 MED ORDER — ONDANSETRON HCL 4 MG PO TABS: 4.0000 mg | ORAL_TABLET | Freq: Four times a day (QID) | ORAL | Status: DC | PRN

## 2022-09-23 MED ORDER — ACETAMINOPHEN 160 MG/5ML PO SOLN: 325.0000 mg | ORAL | Status: DC | PRN

## 2022-09-23 MED ORDER — PHENOL 1.4 % MT LIQD: 1.0000 | OROMUCOSAL | Status: DC | PRN

## 2022-09-23 MED ORDER — UMECLIDINIUM-VILANTEROL 62.5-25 MCG/ACT IN AEPB: 1.0000 | INHALATION_SPRAY | Freq: Every day | RESPIRATORY_TRACT | Status: DC | PRN

## 2022-09-23 MED ORDER — LACTATED RINGERS IV SOLN: INTRAVENOUS | Status: DC

## 2022-09-23 MED ORDER — AMISULPRIDE (ANTIEMETIC) 5 MG/2ML IV SOLN
10.0000 mg | Freq: Once | INTRAVENOUS | Status: AC | PRN
  Administered 2022-09-23: 10 mg via INTRAVENOUS

## 2022-09-23 MED ORDER — OXYCODONE HCL 5 MG/5ML PO SOLN: 5.0000 mg | Freq: Once | ORAL | Status: DC | PRN

## 2022-09-23 MED ORDER — BISACODYL 10 MG RE SUPP: 10.0000 mg | Freq: Every day | RECTAL | Status: DC | PRN

## 2022-09-23 MED ORDER — LIDOCAINE 2% (20 MG/ML) 5 ML SYRINGE
INTRAMUSCULAR | Status: DC | PRN
  Administered 2022-09-23: 40 mg via INTRAVENOUS

## 2022-09-23 MED ORDER — MIDAZOLAM HCL 2 MG/2ML IJ SOLN
INTRAMUSCULAR | Status: AC
  Filled 2022-09-23: qty 2

## 2022-09-23 MED ORDER — VANCOMYCIN HCL IN DEXTROSE 1-5 GM/200ML-% IV SOLN
1000.0000 mg | INTRAVENOUS | Status: AC
  Administered 2022-09-23: 1000 mg via INTRAVENOUS
  Filled 2022-09-23: qty 200

## 2022-09-23 MED ORDER — ROCURONIUM BROMIDE 10 MG/ML (PF) SYRINGE
PREFILLED_SYRINGE | INTRAVENOUS | Status: DC | PRN
  Administered 2022-09-23: 20 mg via INTRAVENOUS
  Administered 2022-09-23: 60 mg via INTRAVENOUS

## 2022-09-23 MED ORDER — BUPIVACAINE-EPINEPHRINE 0.5% -1:200000 IJ SOLN
INTRAMUSCULAR | Status: DC | PRN
  Administered 2022-09-23: 10 mL

## 2022-09-23 MED ORDER — ONDANSETRON HCL 4 MG/2ML IJ SOLN
INTRAMUSCULAR | Status: DC | PRN
  Administered 2022-09-23: 4 mg via INTRAVENOUS

## 2022-09-23 MED ORDER — ACETAMINOPHEN 325 MG PO TABS: 325.0000 mg | ORAL_TABLET | ORAL | Status: DC | PRN

## 2022-09-23 MED ORDER — THROMBIN 5000 UNITS EX SOLR
OROMUCOSAL | Status: DC | PRN
  Administered 2022-09-23: 5 mL via TOPICAL

## 2022-09-23 MED ORDER — ASPIRIN 81 MG PO TBEC: 81.0000 mg | DELAYED_RELEASE_TABLET | Freq: Every day | ORAL | Status: DC

## 2022-09-23 MED ORDER — FENTANYL CITRATE (PF) 250 MCG/5ML IJ SOLN
INTRAMUSCULAR | Status: DC | PRN
  Administered 2022-09-23: 50 ug via INTRAVENOUS
  Administered 2022-09-23: 25 ug via INTRAVENOUS
  Administered 2022-09-23: 50 ug via INTRAVENOUS
  Administered 2022-09-23: 25 ug via INTRAVENOUS

## 2022-09-23 MED ORDER — BACITRACIN ZINC 500 UNIT/GM EX OINT
TOPICAL_OINTMENT | CUTANEOUS | Status: DC | PRN
  Administered 2022-09-23: 1 via TOPICAL

## 2022-09-23 MED ORDER — ACETAMINOPHEN 500 MG PO TABS
1000.0000 mg | ORAL_TABLET | Freq: Four times a day (QID) | ORAL | Status: DC
  Administered 2022-09-23: 1000 mg via ORAL
  Filled 2022-09-23 (×2): qty 2

## 2022-09-23 MED ORDER — OXYCODONE-ACETAMINOPHEN 5-325 MG PO TABS: 1.0000 | ORAL_TABLET | ORAL | 0 refills | Status: DC | PRN

## 2022-09-23 MED ORDER — HYDROMORPHONE HCL 1 MG/ML IJ SOLN
0.2500 mg | INTRAMUSCULAR | Status: DC | PRN
  Administered 2022-09-23: 0.5 mg via INTRAVENOUS

## 2022-09-23 MED ORDER — BUPIVACAINE-EPINEPHRINE (PF) 0.5% -1:200000 IJ SOLN
INTRAMUSCULAR | Status: AC
  Filled 2022-09-23: qty 30

## 2022-09-23 MED ORDER — MENTHOL 3 MG MT LOZG: 1.0000 | LOZENGE | OROMUCOSAL | Status: DC | PRN

## 2022-09-23 MED ORDER — DEXAMETHASONE SODIUM PHOSPHATE 10 MG/ML IJ SOLN
INTRAMUSCULAR | Status: DC | PRN
  Administered 2022-09-23: 10 mg via INTRAVENOUS

## 2022-09-23 MED ORDER — MIDAZOLAM HCL 2 MG/2ML IJ SOLN
INTRAMUSCULAR | Status: DC | PRN
  Administered 2022-09-23 (×2): 1 mg via INTRAVENOUS

## 2022-09-23 MED ORDER — PANTOPRAZOLE SODIUM 40 MG IV SOLR
40.0000 mg | Freq: Every day | INTRAVENOUS | Status: DC
  Filled 2022-09-23: qty 10

## 2022-09-23 MED ORDER — PROMETHAZINE HCL 25 MG/ML IJ SOLN: 6.2500 mg | INTRAMUSCULAR | Status: DC | PRN

## 2022-09-23 SURGICAL SUPPLY — 65 items
APL SKNCLS STERI-STRIP NONHPOA (GAUZE/BANDAGES/DRESSINGS) ×1
BAG COUNTER SPONGE SURGICOUNT (BAG) ×1 IMPLANT
BAG SPNG CNTER NS LX DISP (BAG) ×1
BAND INSRT 18 STRL LF DISP RB (MISCELLANEOUS) ×2
BAND RUBBER #18 3X1/16 STRL (MISCELLANEOUS) IMPLANT
BENZOIN TINCTURE PRP APPL 2/3 (GAUZE/BANDAGES/DRESSINGS) ×2 IMPLANT
BIT DRILL NEURO 2X3.1 SFT TUCH (MISCELLANEOUS) ×1 IMPLANT
BLADE SURG 15 STRL LF DISP TIS (BLADE) ×1 IMPLANT
BLADE SURG 15 STRL SS (BLADE) ×1
BLADE ULTRA TIP 2M (BLADE) ×1 IMPLANT
BUR BARREL STRAIGHT FLUTE 4.0 (BURR) ×1 IMPLANT
BUR MATCHSTICK NEURO 3.0 LAGG (BURR) ×1 IMPLANT
CANISTER SUCT 3000ML PPV (MISCELLANEOUS) ×1 IMPLANT
CARTRIDGE OIL MAESTRO DRILL (MISCELLANEOUS) ×1 IMPLANT
CLSR STERI-STRIP ANTIMIC 1/2X4 (GAUZE/BANDAGES/DRESSINGS) IMPLANT
COVER MAYO STAND STRL (DRAPES) ×1 IMPLANT
DIFFUSER DRILL AIR PNEUMATIC (MISCELLANEOUS) ×1 IMPLANT
DRAPE LAPAROTOMY 100X72 PEDS (DRAPES) ×1 IMPLANT
DRAPE MICROSCOPE SLANT 54X150 (MISCELLANEOUS) IMPLANT
DRAPE SURG 17X23 STRL (DRAPES) ×2 IMPLANT
DRILL NEURO 2X3.1 SOFT TOUCH (MISCELLANEOUS) ×1
DRSG OPSITE POSTOP 3X4 (GAUZE/BANDAGES/DRESSINGS) ×1 IMPLANT
DRSG OPSITE POSTOP 4X6 (GAUZE/BANDAGES/DRESSINGS) IMPLANT
ELECT REM PT RETURN 9FT ADLT (ELECTROSURGICAL) ×1
ELECTRODE REM PT RTRN 9FT ADLT (ELECTROSURGICAL) ×1 IMPLANT
GAUZE 4X4 16PLY ~~LOC~~+RFID DBL (SPONGE) IMPLANT
GLOVE BIO SURGEON STRL SZ 6.5 (GLOVE) ×1 IMPLANT
GLOVE BIO SURGEON STRL SZ8 (GLOVE) ×1 IMPLANT
GLOVE BIO SURGEON STRL SZ8.5 (GLOVE) ×1 IMPLANT
GLOVE BIOGEL PI IND STRL 6.5 (GLOVE) ×1 IMPLANT
GLOVE BIOGEL PI IND STRL 7.0 (GLOVE) IMPLANT
GLOVE BIOGEL PI IND STRL 8 (GLOVE) IMPLANT
GLOVE EXAM NITRILE XL STR (GLOVE) IMPLANT
GLOVE SURG SS PI 8.0 STRL IVOR (GLOVE) IMPLANT
GLOVE SURG SS PI 8.5 STRL STRW (GLOVE) IMPLANT
GOWN STRL REUS W/ TWL LRG LVL3 (GOWN DISPOSABLE) ×1 IMPLANT
GOWN STRL REUS W/ TWL XL LVL3 (GOWN DISPOSABLE) ×1 IMPLANT
GOWN STRL REUS W/TWL LRG LVL3 (GOWN DISPOSABLE) ×1
GOWN STRL REUS W/TWL XL LVL3 (GOWN DISPOSABLE) ×2
HEMOSTAT POWDER KIT SURGIFOAM (HEMOSTASIS) ×1 IMPLANT
KIT BASIN OR (CUSTOM PROCEDURE TRAY) ×1 IMPLANT
KIT TURNOVER KIT B (KITS) ×1 IMPLANT
MARKER SKIN DUAL TIP RULER LAB (MISCELLANEOUS) ×1 IMPLANT
NDL SPNL 18GX3.5 QUINCKE PK (NEEDLE) ×1 IMPLANT
NEEDLE HYPO 22GX1.5 SAFETY (NEEDLE) ×1 IMPLANT
NEEDLE SPNL 18GX3.5 QUINCKE PK (NEEDLE) ×1 IMPLANT
NS IRRIG 1000ML POUR BTL (IV SOLUTION) ×1 IMPLANT
OIL CARTRIDGE MAESTRO DRILL (MISCELLANEOUS) ×1
PACK LAMINECTOMY NEURO (CUSTOM PROCEDURE TRAY) ×1 IMPLANT
PEEK S VISTA 7X11X14 (Peek) IMPLANT
PIN DISTRACTION 14MM (PIN) ×2 IMPLANT
PLATE ANT CONST 26 1LVL (Plate) IMPLANT
PUTTY DBM 2CC CALC GRAN (Putty) IMPLANT
SCREW CERV ST OZARK 4.5X12 (Screw) IMPLANT
SCREW CERV ST OZARK 4X12 (Screw) IMPLANT
SPIKE FLUID TRANSFER (MISCELLANEOUS) ×1 IMPLANT
SPONGE INTESTINAL PEANUT (DISPOSABLE) ×2 IMPLANT
SPONGE SURGIFOAM ABS GEL SZ50 (HEMOSTASIS) IMPLANT
STRIP CLOSURE SKIN 1/2X4 (GAUZE/BANDAGES/DRESSINGS) ×1 IMPLANT
SUT VIC AB 0 CT1 27 (SUTURE) ×1
SUT VIC AB 0 CT1 27XBRD ANTBC (SUTURE) ×1 IMPLANT
SUT VIC AB 3-0 SH 8-18 (SUTURE) ×1 IMPLANT
TOWEL GREEN STERILE (TOWEL DISPOSABLE) ×1 IMPLANT
TOWEL GREEN STERILE FF (TOWEL DISPOSABLE) ×1 IMPLANT
WATER STERILE IRR 1000ML POUR (IV SOLUTION) ×1 IMPLANT

## 2022-09-23 NOTE — Progress Notes (Signed)
Patient alert and oriented, surgical site clean ,dry and intact. Iv d/c, d/c instruction given to the patient.

## 2022-09-23 NOTE — Plan of Care (Signed)

## 2022-09-23 NOTE — Anesthesia Preprocedure Evaluation (Addendum)
Anesthesia Evaluation  Patient identified by MRN, date of birth, ID band Patient awake    Reviewed: Allergy & Precautions, NPO status , Patient's Chart, lab work & pertinent test results  History of Anesthesia Complications (+) PONV and history of anesthetic complications  Airway Mallampati: II  TM Distance: >3 FB Neck ROM: Full    Dental  (+) Teeth Intact, Dental Advisory Given   Pulmonary COPD, former smoker,    breath sounds clear to auscultation       Cardiovascular + CAD   Rhythm:Regular Rate:Normal     Neuro/Psych  Headaches, PSYCHIATRIC DISORDERS Anxiety Depression    GI/Hepatic Neg liver ROS, hiatal hernia, GERD  Medicated,  Endo/Other  negative endocrine ROS  Renal/GU negative Renal ROS     Musculoskeletal  (+) Arthritis ,   Abdominal (+) + obese,   Peds  Hematology negative hematology ROS (+)   Anesthesia Other Findings   Reproductive/Obstetrics                            Anesthesia Physical Anesthesia Plan  ASA: 3  Anesthesia Plan: General   Post-op Pain Management:    Induction: Intravenous  PONV Risk Score and Plan: 4 or greater and Ondansetron and Treatment may vary due to age or medical condition  Airway Management Planned: Oral ETT and Video Laryngoscope Planned  Additional Equipment: None  Intra-op Plan:   Post-operative Plan: Extubation in OR  Informed Consent: I have reviewed the patients History and Physical, chart, labs and discussed the procedure including the risks, benefits and alternatives for the proposed anesthesia with the patient or authorized representative who has indicated his/her understanding and acceptance.     Dental advisory given  Plan Discussed with: CRNA  Anesthesia Plan Comments:        Anesthesia Quick Evaluation

## 2022-09-23 NOTE — Evaluation (Signed)
Occupational Therapy Evaluation Patient Details Name: [name] [name] MRN: [id number] DOB: 1951/01/19 Today's Date: 09/23/2022   History of Present Illness 71 yo female s/p C5-6 ACDF on 9/25. PMH including anxiety, cataract, cancer, glaucoma, IBS, prior cervical sx, and arthritis.   Clinical Impression   PTA, pt was living with her husband and was independent. Currently, pt performing at Mod I level for ADLs and functional mobility. Provided education and handout on cervical precautions, collar management, grooming, UB ADLs, LB ADLs, toilet, stair management, and tub transfer; pt demonstrated understanding. Answered all pt questions. Recommend dc home once medically stable per physician. All acute OT needs met and will sign off. Thank you.    Recommendations for follow up therapy are one component of a multi-disciplinary discharge planning process, led by the attending physician.  Recommendations may be updated based on patient status, additional functional criteria and insurance authorization.   Follow Up Recommendations  No OT follow up    Assistance Recommended at Discharge PRN  Patient can return home with the following      Functional Status Assessment  Patient has had a recent decline in their functional status and demonstrates the ability to make significant improvements in function in a reasonable and predictable amount of time.  Equipment Recommendations  None recommended by OT    Recommendations for Other Services       Precautions / Restrictions Precautions Precautions: Cervical Precaution Booklet Issued: Yes (comment) Precaution Comments: reviewing cervical precautions and compensatory techniques Required Braces or Orthoses: Cervical Brace Cervical Brace: Hard collar;At all times Restrictions Weight Bearing Restrictions: No      Mobility Bed Mobility Overal bed mobility: Modified Independent             General bed mobility comments: educating on log roll     Transfers Overall transfer level: Independent                        Balance Overall balance assessment: No apparent balance deficits (not formally assessed)                                         ADL either performed or assessed with clinical judgement   ADL Overall ADL's : Modified independent                                       General ADL Comments: increased time. Providing education on cervical precautions, grooming, brace management, UB ADLs, LB ADLs, toileting, tub transfer, and stair management     Vision         Perception     Praxis      Pertinent Vitals/Pain Pain Assessment Pain Assessment: Faces Faces Pain Scale: Hurts little more Pain Location: neck Pain Descriptors / Indicators: Discomfort, Grimacing Pain Intervention(s): Monitored during session, Limited activity within patient's tolerance, Repositioned     Hand Dominance Right   Extremity/Trunk Assessment Upper Extremity Assessment Upper Extremity Assessment: Overall WFL for tasks assessed   Lower Extremity Assessment Lower Extremity Assessment: Overall WFL for tasks assessed   Cervical / Trunk Assessment Cervical / Trunk Assessment: Neck Surgery   Communication Communication Communication: No difficulties   Cognition Arousal/Alertness: Awake/alert Behavior During Therapy: WFL for tasks assessed/performed Overall Cognitive Status: Within Functional Limits for tasks assessed  General Comments  Husband present throughout    Exercises     Shoulder Instructions      Home Living Family/patient expects to be discharged to:: Private residence Living Arrangements: Spouse/significant other Available Help at Discharge: Family;Available 24 hours/day Type of Home: House Home Access: Stairs to enter [employer] of Steps: 4 Entrance Stairs-Rails: Right Home Layout: One level      Bathroom Shower/Tub: Tub/shower unit;Door   [employer] Toilet: Standard     Home Equipment: None          Prior Functioning/Environment Prior Level of Function : Independent/Modified Independent;Working/employed;Driving               ADLs Comments: 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        OT Problem List: Decreased range of motion;Decreased knowledge of use of DME or AE;Decreased knowledge of precautions;Decreased activity tolerance;Pain      OT Treatment/Interventions:      OT Goals(Current goals can be found in the care plan section) Acute Rehab OT Goals Patient Stated Goal: Go home OT Goal Formulation: All assessment and education complete, DC therapy  OT Frequency:      Co-evaluation              AM-PAC OT "6 Clicks" Daily Activity     Outcome Measure Help from another person eating meals?: None Help from another person taking care of personal grooming?: None Help from another person toileting, which includes using toliet, bedpan, or urinal?: None Help from another person bathing (including washing, rinsing, drying)?: None Help from another person to put on and taking off regular upper body clothing?: None Help from another person to put on and taking off regular lower body clothing?: None 6 Click Score: 24   End of Session Equipment Utilized During Treatment: Cervical collar Nurse Communication: Mobility status  Activity Tolerance: Patient tolerated treatment well Patient left: in bed;with call bell/phone within reach;with family/visitor present  OT Visit Diagnosis: Muscle weakness (generalized) (M62.81)                Time: 5885-0277 OT Time Calculation (min): 19 min Charges:  OT General Charges $OT Visit: 1 Visit OT Evaluation $OT Eval Low Complexity: 1 Low  [name] [name] MSOT, OTR/L Acute Rehab Office: [place] 09/23/2022, 4:56 PM

## 2022-09-23 NOTE — Transfer of Care (Signed)
Immediate Anesthesia Transfer of Care Note  Patient: [name] [name]  Procedure(s) Performed: Anterior Decompression Fusion,PLATE/SCREWS Cervical five-six; REMOVAL OLD PLATE (Spine Cervical)  Patient Location: PACU  Anesthesia Type:General  Level of Consciousness: awake and alert   Airway & Oxygen Therapy: Patient Spontanous Breathing and Patient connected to nasal cannula oxygen  Post-op Assessment: Report given to RN and Post -op Vital signs reviewed and stable  Post vital signs: Reviewed and stable  Last Vitals:  Vitals Value Taken Time  BP 148/79 09/23/22 1000  Temp    Pulse 75 09/23/22 1005  Resp 17 09/23/22 1005  SpO2 92 % 09/23/22 1005  Vitals shown include unvalidated device data.  Last Pain:  Vitals:   09/23/22 0624  TempSrc:   PainSc: 1       Patients Stated Pain Goal: 0 (91/36/85 9923)  Complications: No notable events documented.

## 2022-09-23 NOTE — Anesthesia Procedure Notes (Signed)
Procedure Name: Intubation Date/Time: 09/23/2022 7:48 AM  Performed by: Inda Coke, CRNAPre-anesthesia Checklist: Patient identified, Emergency Drugs available, Suction available, Timeout performed and Patient being monitored Patient Re-evaluated:Patient Re-evaluated prior to induction Oxygen Delivery Method: Circle system utilized Preoxygenation: Pre-oxygenation with 100% oxygen Induction Type: IV induction Ventilation: Mask ventilation without difficulty Laryngoscope Size: Glidescope and 3 Grade View: Grade I Tube type: Oral Tube size: 7.0 mm Number of attempts: 1 Airway Equipment and Method: Stylet Placement Confirmation: ETT inserted through vocal cords under direct vision, positive ETCO2, CO2 detector and breath sounds checked- equal and bilateral Secured at: 22 cm Tube secured with: Tape Dental Injury: Teeth and Oropharynx as per pre-operative assessment  Comments: Elective glidescope intubation

## 2022-09-23 NOTE — Op Note (Signed)
Brief history: The patient is a 71 year old white female on whom another physician performed a C6-7 anterior cervicectomy fusion plating many years ago.  She has developed recurrent neck and arm pain consistent with a cervical radiculopathy.  She failed medical management.  She was worked up with cervical x-rays and cervical MRI which demonstrated C5-6 spondylosis, foraminal stenosis, etc.  I discussed the various treatment options with her.  She has decided proceed with surgery.  Preoperative diagnosis: C5-6 spondylosis, stenosis, cervicalgia, cervical radiculopathy  Postoperative diagnosis: The same  Procedure: C5-6 anterior cervical discectomy/decompression; C5-6 interbody arthrodesis with local morcellized autograft bone and Zimmer DBM; insertion of interbody prosthesis at C5-6 (Zimmer peek interbody prosthesis); anterior cervical plating from C5-6 with Stryker titanium plate; exploration of cervical fusion/removal of cervical plate  Surgeon: Dr. Earle Gell  Asst.: Arnetha Massy, NP  Anesthesia: Gen. endotracheal  Estimated blood loss: 50 cc  Drains: None  Complications: None  Description of procedure: The patient was brought to the operating room by the anesthesia team. General endotracheal anesthesia was induced. A roll was placed under the patient's shoulders to keep the neck in the neutral position. The patient's anterior cervical region was then prepared with Betadine scrub and Betadine solution. Sterile drapes were applied.  The area to be incised was then injected with Marcaine with epinephrine solution. I then used a scalpel to make a transverse incision in the patient's left anterior neck. I used the Metzenbaum scissors to divide the platysmal muscle and then to dissect through the scar tissue and medial to the sternocleidomastoid muscle, jugular vein, and carotid artery. I carefully dissected down towards the anterior cervical spine identifying the esophagus and retracting it  medially. Then using Kitner swabs to clear soft tissue from the anterior cervical spine and to expose the old anterior cervical plate.  We explored the fusion by removing the screws from the old plate then remove the plate.  We inspected the arthrodesis.  It appeared solid.   I then used electrocautery to detach the medial border of the longus colli muscle bilaterally from the C5-6 intervertebral disc spaces. I then inserted the Caspar self-retaining retractor underneath the longus colli muscle bilaterally to provide exposure.  We then incised the intervertebral disc at C5-6. We then performed a partial intervertebral discectomy with a pituitary forceps and the Karlin curettes. I then inserted distraction screws into the vertebral bodies at C5-6. We then distracted the interspace. We then used the high-speed drill to decorticate the vertebral endplates at O2-7, to drill away the remainder of the intervertebral disc, to drill away some posterior spondylosis, and to thin out the posterior longitudinal ligament. I then incised ligament with the arachnoid knife. We then removed the ligament with a Kerrison punches undercutting the vertebral endplates and decompressing the thecal sac. We then performed foraminotomies about the bilateral C6 nerve roots. This completed the decompression at this level.  We now turned our to attention to the interbody fusion. We used the trial spacers to determine the appropriate size for the interbody prosthesis. We then pre-filled prosthesis with a combination of local morcellized autograft bone that we obtained during decompression as well as Zimmer DBM. We then inserted the prosthesis into the distracted interspace at C5-6. We then removed the distraction screws. There was a good snug fit of the prosthesis in the interspace.  Having completed the fusion we now turned attention to the anterior spinal instrumentation. We used the high-speed drill to drill away some anterior  spondylosis at the disc  spaces so that the plate lay down flat. We selected the appropriate length titanium anterior cervical plate. We laid it along the anterior aspect of the vertebral bodies from C5-6. We then drilled 12 mm holes at C5, we used the old screw holes at C6. We then secured the plate to the vertebral bodies by placing two 12 mm self-tapping screws at C5 and C6. We then obtained intraoperative radiograph. The demonstrating good position of the instrumentation. We therefore secured the screws the plate the locking each cam. This completed the instrumentation.  We then obtained hemostasis using bipolar electrocautery. We irrigated the wound out with bacitracin solution. We then removed the retractor. We inspected the esophagus for any damage. There was none apparent. We then reapproximated patient's platysmal muscle with interrupted 3-0 Vicryl suture. We then reapproximated the subcutaneous tissue with interrupted 3-0 Vicryl suture. The skin was reapproximated with Steri-Strips and benzoin. The wound was then covered with bacitracin ointment. A sterile dressing was applied. The drapes were removed. Patient was subsequently extubated by the anesthesia team and transported to the post anesthesia care unit in stable condition. All sponge instrument and needle counts were reportedly correct at the end of this case.

## 2022-09-23 NOTE — Discharge Summary (Signed)
Physician Discharge Summary  Patient ID: [name] [name] MRN: [id number] DOB/AGE: 08/17/1951 71 y.o.  Admit date: 09/23/2022 Discharge date: 09/23/2022  Admission Diagnoses: C5-6 spondylosis, stenosis, cervicalgia, cervical radiculopathy      Discharge Diagnoses: same   Discharged Condition: good  Hospital Course: The patient was admitted on 09/23/2022 and taken to the operating room where the patient underwent acdf C5-6. The patient tolerated the procedure well and was taken to the recovery room and then to the floor in stable condition. The hospital course was routine. There were no complications. The wound remained clean dry and intact. Pt had appropriate neck soreness. No complaints of arm pain or new N/T/W. The patient remained afebrile with stable vital signs, and tolerated a regular diet. The patient continued to increase activities, and pain was well controlled with oral pain medications.   Consults: None  Significant Diagnostic Studies:  Results for orders placed or performed during the hospital encounter of 09/16/22  Surgical pcr screen   Specimen: Nasal Mucosa; Nasal Swab  Result Value Ref Range   MRSA, PCR NEGATIVE NEGATIVE   Staphylococcus aureus NEGATIVE NEGATIVE  CBC per protocol  Result Value Ref Range   WBC 6.0 4.0 - 10.5 K/uL   RBC 4.60 3.87 - 5.11 MIL/uL   Hemoglobin 14.3 12.0 - 15.0 g/dL   HCT 43.4 36.0 - 46.0 %   MCV 94.3 80.0 - 100.0 fL   MCH 31.1 26.0 - 34.0 pg   MCHC 32.9 30.0 - 36.0 g/dL   RDW 15.1 11.5 - 15.5 %   Platelets 291 150 - 400 K/uL   nRBC 0.0 0.0 - 0.2 %    DG Cervical Spine 1 View  Result Date: 09/23/2022 CLINICAL DATA:  ACDF. EXAM: DG CERVICAL SPINE - 1 VIEW COMPARISON:  Cervical spine radiographs 06/11/2022 FINDINGS: A single portable cross-table lateral radiograph of the cervical spine is provided. Interval ACDF has been performed at C5-6. The plate related to prior C6-7 ACDF appears to have been removed. IMPRESSION: Intraoperative  radiograph during C5-6 ACDF. Electronically Signed   By: Logan Bores M.D.   On: 09/23/2022 10:33    Antibiotics:  Anti-infectives (From admission, onward)    Start     Dose/Rate Route Frequency Ordered Stop   09/23/22 2200  vancomycin (VANCOREADY) IVPB 1500 mg/300 mL        1,500 mg 150 mL/hr over 120 Minutes Intravenous  Once 09/23/22 1207     09/23/22 0615  vancomycin (VANCOCIN) IVPB 1000 mg/200 mL premix        1,000 mg 200 mL/hr over 60 Minutes Intravenous To [facility] 09/23/22 0601 09/23/22 0849       Discharge Exam: Blood pressure (!) 150/77, pulse 79, temperature 97.9 F (36.6 C), temperature source Oral, resp. rate 16, height 5' 9.5" (1.765 m), weight 96.6 kg, SpO2 93 %. Neurologic: Grossly normal Ambulating and voiding well incision cdi   Discharge Medications:   Allergies as of 09/23/2022       Reactions   Bupropion    Other reaction(s): headaches   Cefaclor Diarrhea   Other reaction(s): severe diarrhea   Elemental Sulfur Other (See Comments)   Thrush   Sulfa Antibiotics    Latex Other (See Comments), Rash   blister Other reaction(s): blisters blister   Ofloxacin Diarrhea        Medication List     TAKE these medications    Anoro Ellipta 62.5-25 MCG/ACT Aepb Generic drug: umeclidinium-vilanterol Inhale 1 puff into the lungs daily. What  changed:  when to take this reasons to take this   aspirin EC 81 MG tablet Take 81 mg by mouth daily. Swallow whole.   denosumab 60 MG/ML Sosy injection Commonly known as: PROLIA Inject 60 mg into the skin every 6 (six) months.   famotidine 20 MG tablet Commonly known as: PEPCID Take 1 tablet (20 mg total) by mouth 2 (two) times daily. As needed What changed:  when to take this reasons to take this additional instructions   Leqvio 284 MG/1.5ML Sosy injection Generic drug: inclisiran Inject 284 mg into the skin See admin instructions. Every three months   methocarbamol 750 MG tablet Commonly  known as: Robaxin-750 Take 1 tablet (750 mg total) by mouth 4 (four) times daily.   oxyCODONE-acetaminophen 5-325 MG tablet Commonly known as: Percocet Take 1 tablet by mouth every 4 (four) hours as needed for severe pain.   promethazine 12.5 MG suppository Commonly known as: PHENERGAN Place 12.5 mg rectally every 6 (six) hours as needed.   Systane 0.4-0.3 % Soln Generic drug: Polyethyl Glycol-Propyl Glycol Place 1 drop into both eyes daily as needed (Dry eye).   VITAMIN D PO Take 5,000 Units by mouth daily.        Disposition: home   Final Dx: acdf c5-6  Discharge Instructions      Remove dressing in 72 hours   Complete by: As directed    Call MD for:  difficulty breathing, headache or visual disturbances   Complete by: As directed    Call MD for:  hives   Complete by: As directed    Call MD for:  persistant nausea and vomiting   Complete by: As directed    Call MD for:  redness, tenderness, or signs of infection (pain, swelling, redness, odor or green/yellow discharge around incision site)   Complete by: As directed    Call MD for:  severe uncontrolled pain   Complete by: As directed    Call MD for:  temperature >100.4   Complete by: As directed    Diet - low sodium heart healthy   Complete by: As directed    Driving Restrictions   Complete by: As directed    No driving for 2 weeks, no riding in the car for 1 week   Increase activity slowly   Complete by: As directed    Lifting restrictions   Complete by: As directed    No lifting more than 8 lbs          Signed: Ocie Cornfield Tian Mcmurtrey 09/23/2022, 4:35 PM

## 2022-09-23 NOTE — Progress Notes (Signed)
Orthopedic Tech Progress Note Patient Details:  [name] [name] [birth year] [id number]  PACU RN called requesting an [place]   Patient ID: [name] [name], female   DOB: October 01, 1951, 71 y.o.   MRN: [id number]  Janit Pagan 09/23/2022, 10:36 AM

## 2022-09-23 NOTE — Anesthesia Postprocedure Evaluation (Signed)
Anesthesia Post Note  Patient: Therisa Mennella Russett  Procedure(s) Performed: Anterior Decompression Fusion,PLATE/SCREWS Cervical five-six; REMOVAL OLD PLATE (Spine Cervical)     Patient location during evaluation: PACU Anesthesia Type: General Level of consciousness: awake and alert Pain management: pain level controlled Vital Signs Assessment: post-procedure vital signs reviewed and stable Respiratory status: spontaneous breathing, nonlabored ventilation, respiratory function stable and patient connected to nasal cannula oxygen Cardiovascular status: blood pressure returned to baseline and stable Postop Assessment: no apparent nausea or vomiting Anesthetic complications: no   No notable events documented.  Last Vitals:  Vitals:   09/23/22 1130 09/23/22 1152  BP: 134/72 (!) 150/77  Pulse: 71 79  Resp: 18 16  Temp:  36.6 C  SpO2: 91% 93%    Last Pain:  Vitals:   09/23/22 1152  TempSrc: Oral  PainSc:                  Effie Berkshire

## 2022-09-23 NOTE — H&P (Signed)
Subjective: The patient is a 71 year old white female on whom Dr. Carloyn Manner performed an anterior cervicectomy fusion plating years ago.  She has developed recurrent neck and arm pain consistent with a cervical radiculopathy.  She has failed medical management and was worked up with a cervical MRI and cervical x-rays which demonstrated C5-6 spondylosis, stenosis, etc.  I discussed the various treatment options with her.  She has decided proceed with surgery.  Past Medical History:  Diagnosis Date   Adenomatous colon polyp    Allergy    Anxiety    Arthritis    bilateral hands. lower back   Cancer [facility])    skin   Cataract    bilateral   COPD (chronic obstructive pulmonary disease) (HCC)    Depression    Diverticulosis    Endometriosis    GERD (gastroesophageal reflux disease)    Glaucoma    Headache    Migraines   Hiatal hernia    IBS (irritable bowel syndrome)    Inguinal hernia    Pneumothorax 1987   PONV (postoperative nausea and vomiting)     Past Surgical History:  Procedure Laterality Date   ABDOMINAL HYSTERECTOMY  Rew   BACK SURGERY     cervical   CATARACT EXTRACTION Bilateral    2021, 2022   COLONOSCOPY  2023   PLEURAL SCARIFICATION     ruptured disk     torn tendon     right arm   TUBAL LIGATION      Allergies  Allergen Reactions   Bupropion     Other reaction(s): headaches   Cefaclor Diarrhea    Other reaction(s): severe diarrhea   Elemental Sulfur Other (See Comments)    Thrush   Sulfa Antibiotics    Latex Other (See Comments) and Rash    blister Other reaction(s): blisters blister    Ofloxacin Diarrhea    Social History   Tobacco Use   Smoking status: Former    Packs/day: 1.50    Years: 40.00    Total pack years: 60.00    Types: Cigarettes    Start date: 12/31/1971    Quit date: 02/10/2012    Years since quitting: 10.6   Smokeless tobacco: Never  Substance Use Topics   Alcohol use: No    Family History  Problem Relation  Age of Onset   Heart failure Mother    Osteoporosis Sister    Crohn's disease Sister    Neuropathy Sister    Thyroid disease Sister    Lung disease Sister    Colon polyps Sister    Thyroid cancer Maternal Grandmother    Colon cancer Neg Hx    Breast cancer Neg Hx    Esophageal cancer Neg Hx    Pancreatic cancer Neg Hx    Stomach cancer Neg Hx    Prior to Admission medications   Medication Sig Start Date End Date Taking? Authorizing Provider  aspirin EC 81 MG tablet Take 81 mg by mouth daily. Swallow whole.   Yes [provider]  Cholecalciferol (VITAMIN D PO) Take 5,000 Units by mouth daily.    Yes [provider]  famotidine (PEPCID) 20 MG tablet Take 1 tablet (20 mg total) by mouth 2 (two) times daily. As needed Patient taking differently: Take 20 mg by mouth daily as needed for heartburn or indigestion. 05/09/22  Yes Nandigam, Venia Minks, MD  Polyethyl Glycol-Propyl Glycol (SYSTANE) 0.4-0.3 % SOLN Place 1 drop into both eyes daily  as needed (Dry eye).   Yes [provider]  umeclidinium-vilanterol (ANORO ELLIPTA) 62.5-25 MCG/ACT AEPB Inhale 1 puff into the lungs daily. Patient taking differently: Inhale 1 puff into the lungs daily as needed (Shortness of breath). 04/29/22  Yes Spero Geralds, MD  denosumab (PROLIA) 60 MG/ML SOSY injection Inject 60 mg into the skin every 6 (six) months.    [provider]  inclisiran (LEQVIO) 284 MG/1.5ML SOSY injection Inject 284 mg into the skin See admin instructions. Every three months    [provider]  promethazine (PHENERGAN) 12.5 MG suppository Place 12.5 mg rectally every 6 (six) hours as needed. 05/09/22   [provider]     Review of Systems  Positive ROS: As above  All other systems have been reviewed and were otherwise negative with the exception of those mentioned in the HPI and as above.  Objective: Vital signs in last 24 hours: Temp:  [97.6 F (36.4 C)] 97.6 F (36.4 C)  (09/25 0605) Pulse Rate:  [62] 62 (09/25 0605) Resp:  [17] 17 (09/25 0605) BP: (173)/(85) 173/85 (09/25 0605) SpO2:  [96 %] 96 % (09/25 0605) Weight:  [96.6 kg] 96.6 kg (09/25 0605) Estimated body mass index is 31 kg/m as calculated from the following:   Height as of this encounter: 5' 9.5" (1.765 m).   Weight as of this encounter: 96.6 kg.   General Appearance: Alert Head: Normocephalic, without obvious abnormality, atraumatic Eyes: PERRL, conjunctiva/corneas clear, EOM's intact,    Ears: Normal  Throat: Normal  Neck: Her anterior cervical incision is well-healed.  She has limited cervical range of motion. Back: unremarkable Lungs: Clear to auscultation bilaterally, respirations unlabored Heart: Regular rate and rhythm, no murmur, rub or gallop Abdomen: Soft, non-tender Extremities: Extremities normal, atraumatic, no cyanosis or edema Skin: unremarkable  NEUROLOGIC:   Mental status: alert and oriented,Motor Exam - grossly normal Sensory Exam - grossly normal Reflexes:  Coordination - grossly normal Gait - grossly normal Balance - grossly normal Cranial Nerves: I: smell Not tested  II: visual acuity  OS: Normal  OD: Normal   II: visual fields Full to confrontation  II: pupils Equal, round, reactive to light  III,VII: ptosis None  III,IV,VI: extraocular muscles  Full ROM  V: mastication Normal  V: facial light touch sensation  Normal  V,VII: corneal reflex  Present  VII: facial muscle function - upper  Normal  VII: facial muscle function - lower Normal  VIII: hearing Not tested  IX: soft palate elevation  Normal  IX,X: gag reflex Present  XI: trapezius strength  5/5  XI: sternocleidomastoid strength 5/5  XI: neck flexion strength  5/5  XII: tongue strength  Normal    Data Review Lab Results  Component Value Date   WBC 6.0 09/16/2022   HGB 14.3 09/16/2022   HCT 43.4 09/16/2022   MCV 94.3 09/16/2022   PLT 291 09/16/2022   Lab Results  Component Value Date    NA 142 10/20/2017   K 4.7 10/20/2017   CL 106 10/20/2017   CO2 28 10/20/2017   BUN 16 01/27/2019   CREATININE 1.04 01/27/2019   GLUCOSE 97 10/20/2017   No results found for: "INR", "PROTIME"  Assessment/Plan: C5-6 spondylosis, stenosis, cervical radiculopathy, cervicalgia: I have discussed the situation with the patient.  I reviewed her imaging studies with her and pointed out the abnormalities.  We have discussed the various treatment options including surgery.  I have described the surgical treatment option of the C5-6  anterior cervicectomy fusion plating with likely removal of her old plate.  I have shown her surgical models.  I have given her a surgical pamphlet.  We have discussed the risk, benefits, alternatives, expected postop course, and likelihood of achieving our goals with surgery.  I have answered all her questions.  She has decided proceed with surgery.   Ophelia Charter 09/23/2022 7:19 AM

## 2022-09-24 ENCOUNTER — Encounter (HOSPITAL_COMMUNITY): Payer: Self-pay | Admitting: Neurosurgery

## 2022-09-25 ENCOUNTER — Ambulatory Visit: Payer: Medicare Other

## 2022-10-11 DIAGNOSIS — Z23 Encounter for immunization: Secondary | ICD-10-CM | POA: Diagnosis not present

## 2022-10-15 DIAGNOSIS — Z6841 Body Mass Index (BMI) 40.0 and over, adult: Secondary | ICD-10-CM | POA: Diagnosis not present

## 2022-10-15 DIAGNOSIS — M4722 Other spondylosis with radiculopathy, cervical region: Secondary | ICD-10-CM | POA: Diagnosis not present

## 2022-10-15 DIAGNOSIS — M4712 Other spondylosis with myelopathy, cervical region: Secondary | ICD-10-CM | POA: Diagnosis not present

## 2022-10-22 ENCOUNTER — Ambulatory Visit (INDEPENDENT_AMBULATORY_CARE_PROVIDER_SITE_OTHER): Payer: Medicare Other | Admitting: *Deleted

## 2022-10-22 VITALS — BP 154/87 | HR 69 | Temp 97.6°F | Resp 16 | Ht 71.0 in | Wt 214.8 lb

## 2022-10-22 DIAGNOSIS — I2510 Atherosclerotic heart disease of native coronary artery without angina pectoris: Secondary | ICD-10-CM | POA: Diagnosis not present

## 2022-10-22 DIAGNOSIS — E7800 Pure hypercholesterolemia, unspecified: Secondary | ICD-10-CM | POA: Diagnosis not present

## 2022-10-22 DIAGNOSIS — T466X5A Adverse effect of antihyperlipidemic and antiarteriosclerotic drugs, initial encounter: Secondary | ICD-10-CM

## 2022-10-22 MED ORDER — INCLISIRAN SODIUM 284 MG/1.5ML ~~LOC~~ SOSY
284.0000 mg | PREFILLED_SYRINGE | Freq: Once | SUBCUTANEOUS | Status: AC
  Administered 2022-10-22: 284 mg via SUBCUTANEOUS
  Filled 2022-10-22: qty 1.5

## 2022-10-22 NOTE — Progress Notes (Signed)
Diagnosis: Hyperlipidemia  Provider:  Marshell Garfinkel MD  Procedure: Injection  Leqvio (inclisiran), Dose: 284 mg, Site: subcutaneous, Number of injections: 1  Post Care: Observation period completed and Peripheral IV Discontinued  Discharge: Condition: Good, Destination: Home . AVS provided to patient.   Performed by:  Oren Beckmann, RN

## 2022-11-12 DIAGNOSIS — Z6841 Body Mass Index (BMI) 40.0 and over, adult: Secondary | ICD-10-CM | POA: Diagnosis not present

## 2022-11-12 DIAGNOSIS — M5441 Lumbago with sciatica, right side: Secondary | ICD-10-CM | POA: Diagnosis not present

## 2022-11-12 DIAGNOSIS — G8929 Other chronic pain: Secondary | ICD-10-CM | POA: Diagnosis not present

## 2022-11-12 DIAGNOSIS — M5442 Lumbago with sciatica, left side: Secondary | ICD-10-CM | POA: Diagnosis not present

## 2022-11-18 DIAGNOSIS — F418 Other specified anxiety disorders: Secondary | ICD-10-CM | POA: Diagnosis not present

## 2022-11-18 DIAGNOSIS — E049 Nontoxic goiter, unspecified: Secondary | ICD-10-CM | POA: Diagnosis not present

## 2022-11-18 DIAGNOSIS — M810 Age-related osteoporosis without current pathological fracture: Secondary | ICD-10-CM | POA: Diagnosis not present

## 2022-11-18 DIAGNOSIS — R911 Solitary pulmonary nodule: Secondary | ICD-10-CM | POA: Diagnosis not present

## 2022-11-18 DIAGNOSIS — D126 Benign neoplasm of colon, unspecified: Secondary | ICD-10-CM | POA: Diagnosis not present

## 2022-11-18 DIAGNOSIS — I700 Atherosclerosis of aorta: Secondary | ICD-10-CM | POA: Diagnosis not present

## 2022-11-18 DIAGNOSIS — J449 Chronic obstructive pulmonary disease, unspecified: Secondary | ICD-10-CM | POA: Diagnosis not present

## 2022-11-18 DIAGNOSIS — G43909 Migraine, unspecified, not intractable, without status migrainosus: Secondary | ICD-10-CM | POA: Diagnosis not present

## 2022-11-18 DIAGNOSIS — K582 Mixed irritable bowel syndrome: Secondary | ICD-10-CM | POA: Diagnosis not present

## 2022-11-18 DIAGNOSIS — I2584 Coronary atherosclerosis due to calcified coronary lesion: Secondary | ICD-10-CM | POA: Diagnosis not present

## 2022-11-18 DIAGNOSIS — H409 Unspecified glaucoma: Secondary | ICD-10-CM | POA: Diagnosis not present

## 2022-11-20 DIAGNOSIS — M47816 Spondylosis without myelopathy or radiculopathy, lumbar region: Secondary | ICD-10-CM | POA: Diagnosis not present

## 2022-11-20 DIAGNOSIS — M4726 Other spondylosis with radiculopathy, lumbar region: Secondary | ICD-10-CM | POA: Diagnosis not present

## 2022-11-20 DIAGNOSIS — M5442 Lumbago with sciatica, left side: Secondary | ICD-10-CM | POA: Diagnosis not present

## 2022-12-04 DIAGNOSIS — Z961 Presence of intraocular lens: Secondary | ICD-10-CM | POA: Diagnosis not present

## 2022-12-11 DIAGNOSIS — L570 Actinic keratosis: Secondary | ICD-10-CM | POA: Diagnosis not present

## 2022-12-11 DIAGNOSIS — C44311 Basal cell carcinoma of skin of nose: Secondary | ICD-10-CM | POA: Diagnosis not present

## 2022-12-11 DIAGNOSIS — X32XXXD Exposure to sunlight, subsequent encounter: Secondary | ICD-10-CM | POA: Diagnosis not present

## 2022-12-16 DIAGNOSIS — Z23 Encounter for immunization: Secondary | ICD-10-CM | POA: Diagnosis not present

## 2022-12-16 DIAGNOSIS — M545 Low back pain, unspecified: Secondary | ICD-10-CM | POA: Diagnosis not present

## 2022-12-16 DIAGNOSIS — F339 Major depressive disorder, recurrent, unspecified: Secondary | ICD-10-CM | POA: Diagnosis not present

## 2023-01-06 DIAGNOSIS — M810 Age-related osteoporosis without current pathological fracture: Secondary | ICD-10-CM | POA: Diagnosis not present

## 2023-01-08 ENCOUNTER — Encounter: Payer: Self-pay | Admitting: Pulmonary Disease

## 2023-01-09 DIAGNOSIS — T466X5A Adverse effect of antihyperlipidemic and antiarteriosclerotic drugs, initial encounter: Secondary | ICD-10-CM | POA: Diagnosis not present

## 2023-01-09 DIAGNOSIS — I2510 Atherosclerotic heart disease of native coronary artery without angina pectoris: Secondary | ICD-10-CM | POA: Diagnosis not present

## 2023-01-09 DIAGNOSIS — M791 Myalgia, unspecified site: Secondary | ICD-10-CM | POA: Diagnosis not present

## 2023-01-09 DIAGNOSIS — E7800 Pure hypercholesterolemia, unspecified: Secondary | ICD-10-CM | POA: Diagnosis not present

## 2023-01-09 DIAGNOSIS — F419 Anxiety disorder, unspecified: Secondary | ICD-10-CM | POA: Diagnosis not present

## 2023-01-13 ENCOUNTER — Ambulatory Visit (INDEPENDENT_AMBULATORY_CARE_PROVIDER_SITE_OTHER): Payer: Medicare Other | Admitting: Family Medicine

## 2023-01-13 VITALS — BP 128/76 | Ht 69.5 in | Wt 219.0 lb

## 2023-01-13 DIAGNOSIS — M545 Low back pain, unspecified: Secondary | ICD-10-CM | POA: Diagnosis not present

## 2023-01-13 NOTE — Patient Instructions (Signed)
Get x-rays of your low back after you leave today - we will call you with the results. We will fax a form to [facility] to get a copy of the MRI results of your lumbar spine. If we still haven't received them by the time you see them Friday you may want to ask for a copy from them. Ok to take tylenol for baseline pain relief (1-2 extra strength tabs 3x/day) Take advil with food for pain and inflammation as needed ('600mg'$  three times a day). Stay as active as possible. Do home exercises and stretches as directed - hold each for 20-30 seconds and do each one three times. Consider physical therapy. Strengthening of low back muscles, abdominal musculature are key for long term pain relief. Follow up will depend on the x-ray and MRI results.

## 2023-01-13 NOTE — Progress Notes (Signed)
   Subjective:   HPI: Patient is a 72 y.o. female here for low back pain. PMH significant for lumbar compression fracture, cervical spondylosis with myelopathy and radiculopathy, and osteoporosis.  She presents for lower back pain that has been present since September when she has cervical spine surgery with [facility]. She told her surgeon that she was having this back pain a few months ago and they obtained x-rays and told her it appeared to be arthritis but they would get an MRI. Patient had MRI done in November but had not yet heard back regarding results. She has continued to have significant pain and notes that in the last few days her pain level has been as bad as when she first was diagnosed with her lumbar compression fracture. She has still been able to ambulate with minimal issue and doesn't like taking medication so she has only been taking Aleve sparingly (about once every 2 weeks). She goes back to the neurosurgery office this Friday for her neck surgery follow-up.   PMH, medications, surgical history, social history, family history and allergies reviewed.     Objective:  Physical Exam: BP 128/76   Ht 5' 9.5" (1.765 m)   Wt 219 lb (99.3 kg)   BMI 31.88 kg/m  Gen: awake, alert, NAD, comfortable in exam room Pulm: breathing unlabored  Lumbar spine: - Inspection: no gross deformity or asymmetry, swelling or ecchymosis - Palpation: TTP over the b/l SI joints. No TTP over the spinous processes, paraspinal muscles. - ROM: full active ROM of the lumbar spine  - Strength: 5/5 strength of lower extremity in L4-S1 nerve root distributions b/l; normal gait - Neuro: sensation intact in the L4-S1 nerve root distribution b/l - Special testing: Negative straight leg raise, Negative FABER and FADIR    DG Cervical Spine 1 view Date: 09/23/2022 Impression: intraoperative radiograph during C5-6 ACDF.    Assessment & Plan:  1. Low back pain Based on physical exam and history  the pain is most consistent with SI joint inflammation, but given the acute worsening that was similar to prior compression fracture we will obtain lumbar x-rays. When discussing different treatment alternatives, patient declines injections and further medications. She may proceed with PT if it would help her pain. She does have osteoporosis and is on Prolia so we would want to avoid steroids (especially systemic). Patient is following up with neurosurgery on Friday and will have further discussion with their office as well. - DG lumbar spine to evaluate for compression fracture - Continue medication management as needed, no new prescriptions at this time - Follow-up with neurosurgery (recommended patient can get MRI copy and bring to [facility] office if she wishes)  - Consider PT in the future for strengthening   [name] Wilds, DO

## 2023-01-14 ENCOUNTER — Ambulatory Visit
Admission: RE | Admit: 2023-01-14 | Discharge: 2023-01-14 | Disposition: A | Discharge status: Home / Self Care | Payer: Medicare Other | Source: Ambulatory Visit | Attending: Family Medicine | Admitting: Family Medicine

## 2023-01-14 ENCOUNTER — Encounter: Payer: Self-pay | Admitting: Pulmonary Disease

## 2023-01-14 DIAGNOSIS — M545 Low back pain, unspecified: Secondary | ICD-10-CM

## 2023-01-17 DIAGNOSIS — M4722 Other spondylosis with radiculopathy, cervical region: Secondary | ICD-10-CM | POA: Diagnosis not present

## 2023-01-17 DIAGNOSIS — S22080S Wedge compression fracture of T11-T12 vertebra, sequela: Secondary | ICD-10-CM | POA: Diagnosis not present

## 2023-01-22 DIAGNOSIS — Z85828 Personal history of other malignant neoplasm of skin: Secondary | ICD-10-CM | POA: Diagnosis not present

## 2023-01-22 DIAGNOSIS — B0089 Other herpesviral infection: Secondary | ICD-10-CM | POA: Diagnosis not present

## 2023-01-22 DIAGNOSIS — Z08 Encounter for follow-up examination after completed treatment for malignant neoplasm: Secondary | ICD-10-CM | POA: Diagnosis not present

## 2023-02-11 DIAGNOSIS — I2510 Atherosclerotic heart disease of native coronary artery without angina pectoris: Secondary | ICD-10-CM | POA: Diagnosis not present

## 2023-02-11 DIAGNOSIS — F419 Anxiety disorder, unspecified: Secondary | ICD-10-CM | POA: Diagnosis not present

## 2023-02-11 DIAGNOSIS — E039 Hypothyroidism, unspecified: Secondary | ICD-10-CM | POA: Diagnosis not present

## 2023-02-11 DIAGNOSIS — E559 Vitamin D deficiency, unspecified: Secondary | ICD-10-CM | POA: Diagnosis not present

## 2023-02-11 DIAGNOSIS — T466X5A Adverse effect of antihyperlipidemic and antiarteriosclerotic drugs, initial encounter: Secondary | ICD-10-CM | POA: Diagnosis not present

## 2023-02-11 DIAGNOSIS — E7800 Pure hypercholesterolemia, unspecified: Secondary | ICD-10-CM | POA: Diagnosis not present

## 2023-02-11 DIAGNOSIS — M791 Myalgia, unspecified site: Secondary | ICD-10-CM | POA: Diagnosis not present

## 2023-02-11 DIAGNOSIS — Z79899 Other long term (current) drug therapy: Secondary | ICD-10-CM | POA: Diagnosis not present

## 2023-03-03 ENCOUNTER — Ambulatory Visit (INDEPENDENT_AMBULATORY_CARE_PROVIDER_SITE_OTHER): Payer: Medicare Other | Admitting: Internal Medicine

## 2023-03-03 ENCOUNTER — Encounter: Payer: Self-pay | Admitting: Internal Medicine

## 2023-03-03 ENCOUNTER — Telehealth: Payer: Self-pay | Admitting: Internal Medicine

## 2023-03-03 VITALS — BP 138/78 | HR 81 | Temp 97.8°F | Ht 69.5 in | Wt 220.8 lb

## 2023-03-03 DIAGNOSIS — J4489 Other specified chronic obstructive pulmonary disease: Secondary | ICD-10-CM

## 2023-03-03 DIAGNOSIS — J439 Emphysema, unspecified: Secondary | ICD-10-CM | POA: Diagnosis not present

## 2023-03-03 DIAGNOSIS — J301 Allergic rhinitis due to pollen: Secondary | ICD-10-CM | POA: Diagnosis not present

## 2023-03-03 MED ORDER — STIOLTO RESPIMAT 2.5-2.5 MCG/ACT IN AERS: 2.0000 | INHALATION_SPRAY | Freq: Every day | RESPIRATORY_TRACT | 5 refills | Status: DC

## 2023-03-03 NOTE — Patient Instructions (Addendum)
Please schedule follow up scheduled with myself in 3 months.  If my schedule is not open yet, we will contact you with a reminder closer to that time. Please call [phone] if you haven't heard from [place] a month before.   Stop anoro inhaler. Switch to stiolto 2 inhalations once daily. Let me know if you're not feeling better.  Take albuterol more frequent if needed.   Take the albuterol rescue inhaler every 4 to 6 hours as needed for wheezing or shortness of breath. You can also take it 15 minutes before exercise or exertional activity. Side effects include heart racing or pounding, jitters or anxiety. If you have a history of an irregular heart rhythm, it can make this worse. Can also give some patients a hard time sleeping.  Continue levocetirizine for allergies.

## 2023-03-03 NOTE — Telephone Encounter (Signed)
Pt calling in bc she received a prescription today at her appt w Dr. Shearon Stalls for Stiolto Respimat and it was priced at 338.02. Pt wants something else sent instead.  Pharmacy: [facility] on [address]

## 2023-03-03 NOTE — Progress Notes (Signed)
KIERSYN DRUKER    EZ:[id number]    1951-10-12  Primary Care Physician:Pharr, Thayer Jew, MD Date of Appointment: 03/03/2023 Established Patient Visit  Chief complaint:   Chief Complaint  Patient presents with   Follow-up    SOB, cough, wheezing x 1 week     HPI: ISIOMA BUGGS is a 72 y.o. woman with history of tobacco use and rhinitis as well as pulmonary nodule.   Interval Updates: Here for follow up for COPD. Was prescribed Anoro in May 2023 for symptoms but wasn't needing back in September 2023. In the last 2 weeks has had worsening dyspnea started with sinus congestion. She tried anoro for 3-4 days but gives her bad heartburn.  Resumed xyzal taken albuterol once and it seemed to help.  No fevers, chills No productive cough Appetite is good.   No prednisone or abx courses.  Worsening post nasal drainage.   I have reviewed the patient's family social and past medical history and updated as appropriate.   Past Medical History:  Diagnosis Date   Adenomatous colon polyp    Allergy    Anxiety    Arthritis    bilateral hands. lower back   Cancer [facility])    skin   Cataract    bilateral   COPD (chronic obstructive pulmonary disease) (HCC)    Depression    Diverticulosis    Endometriosis    GERD (gastroesophageal reflux disease)    Glaucoma    Headache    Migraines   Hiatal hernia    IBS (irritable bowel syndrome)    Inguinal hernia    Pneumothorax 1987   PONV (postoperative nausea and vomiting)     Past Surgical History:  Procedure Laterality Date   ABDOMINAL HYSTERECTOMY  1992   ANTERIOR CERVICAL DECOMP/DISCECTOMY FUSION N/A 09/23/2022   Procedure: Anterior Decompression Fusion,PLATE/SCREWS Cervical five-six; REMOVAL OLD PLATE;  Surgeon: Newman Pies, MD;  Location: [place];  Service: Neurosurgery;  Laterality: N/A;   APPENDECTOMY  1992   BACK SURGERY     cervical   CATARACT EXTRACTION Bilateral    2021, 2022   COLONOSCOPY  2023   PLEURAL  SCARIFICATION     ruptured disk     torn tendon     right arm   TUBAL LIGATION      Family History  Problem Relation Age of Onset   Heart failure Mother    Osteoporosis Sister    Crohn's disease Sister    Neuropathy Sister    Thyroid disease Sister    Lung disease Sister    Colon polyps Sister    Thyroid cancer Maternal Grandmother    Colon cancer Neg Hx    Breast cancer Neg Hx    Esophageal cancer Neg Hx    Pancreatic cancer Neg Hx    Stomach cancer Neg Hx     Social History   Occupational History   Occupation: locksmith  Tobacco Use   Smoking status: Former    Packs/day: 1.50    Years: 40.00    Total pack years: 60.00    Types: Cigarettes    Start date: 12/31/1971    Quit date: 02/10/2012    Years since quitting: 11.0   Smokeless tobacco: Never  Vaping Use   Vaping Use: Never used  Substance and Sexual Activity   Alcohol use: No   Drug use: No   Sexual activity: Never     Physical Exam: Blood pressure 138/78, pulse  81, temperature 97.8 F (36.6 C), temperature source Oral, height 5' 9.5" (1.765 m), weight 220 lb 12.8 oz (100.2 kg), SpO2 95 %.  Gen:     NAD ENT: nares patent, no polyps Lungs:   ctab no wheezes or crackles CV:         RRR, no mrg    Data Reviewed: Imaging: I have personally reviewed the CT Chest from Oct 2022 with stable 59m pulmonary nodule.   PFTs: None on file.  Labs: Lab Results  Component Value Date   WBC 6.0 09/16/2022   HGB 14.3 09/16/2022   HCT 43.4 09/16/2022   MCV 94.3 09/16/2022   PLT 291 09/16/2022   Lab Results  Component Value Date   NA 142 10/20/2017   K 4.7 10/20/2017   CL 106 10/20/2017   CO2 28 10/20/2017    Immunization status: Immunization History  Administered Date(s) Administered   Influenza, High Dose Seasonal PF 08/28/2016, 10/16/2018, 09/16/2019, 09/18/2020   Influenza, Quadrivalent, Recombinant, Inj, Pf 10/10/2017, 10/16/2018, 09/15/2020, 10/02/2021   Influenza, Seasonal, Injecte,  Preservative Fre 09/13/2014   Influenza-Unspecified 09/22/2012, 09/29/2018, 09/15/2020, 10/02/2021, 09/11/2022   PFIZER(Purple Top)SARS-COV-2 Vaccination 01/21/2020, 02/11/2020   Pneumococcal Conjugate-13 03/20/2017   Pneumococcal Polysaccharide-23 03/30/2018   Td 12/26/2014   Zoster Recombinat (Shingrix) 09/22/2019   Zoster, Live 03/01/2015    Assessment:  COPD moderate FEV1 65% of predicted, progression of symptoms Chronic Cough - seasonal allergic rhinitis, recurrence of symptoms.  Solitary pulmonary nodule 639min the RML - stable since 2020. No further follow up needed.  GERD  Plan/Recommendations: Stop anoro, switch to stiolto  Continue levocetirizine for allergies.   Use albuterol more frequently.   Return to Care: Return in about 3 months (around 06/03/2023).   NiLenice LlamasMD Pulmonary and [facility]

## 2023-03-03 NOTE — Telephone Encounter (Signed)
Dr. Shearon Stalls, please see mychart message from pt and the one that you sent to this pt as well.

## 2023-03-03 NOTE — Telephone Encounter (Signed)
Can we run a ticket to see what is covered with patients insurance  Thank you

## 2023-03-03 NOTE — Progress Notes (Signed)
The patient has been prescribed the inhaler stiolto. Inhaler technique was demonstrated to patient. The patient subsequently demonstrated correct technique.

## 2023-03-04 ENCOUNTER — Other Ambulatory Visit: Payer: Self-pay | Admitting: Internal Medicine

## 2023-03-04 ENCOUNTER — Encounter: Payer: Self-pay | Admitting: Pulmonary Disease

## 2023-03-04 ENCOUNTER — Other Ambulatory Visit (HOSPITAL_COMMUNITY): Payer: Self-pay

## 2023-03-04 DIAGNOSIS — Z961 Presence of intraocular lens: Secondary | ICD-10-CM | POA: Diagnosis not present

## 2023-03-04 DIAGNOSIS — R053 Chronic cough: Secondary | ICD-10-CM

## 2023-03-04 DIAGNOSIS — H18413 Arcus senilis, bilateral: Secondary | ICD-10-CM | POA: Diagnosis not present

## 2023-03-04 DIAGNOSIS — H40013 Open angle with borderline findings, low risk, bilateral: Secondary | ICD-10-CM | POA: Diagnosis not present

## 2023-03-04 DIAGNOSIS — H26492 Other secondary cataract, left eye: Secondary | ICD-10-CM | POA: Diagnosis not present

## 2023-03-04 DIAGNOSIS — H26493 Other secondary cataract, bilateral: Secondary | ICD-10-CM | POA: Diagnosis not present

## 2023-03-04 MED ORDER — ALBUTEROL SULFATE HFA 108 (90 BASE) MCG/ACT IN AERS: 2.0000 | INHALATION_SPRAY | Freq: Four times a day (QID) | RESPIRATORY_TRACT | 5 refills | Status: DC | PRN

## 2023-03-04 NOTE — Addendum Note (Signed)
Addended by: Lenice Llamas on: 03/04/2023 01:15 PM   Modules accepted: Orders

## 2023-03-04 NOTE — Telephone Encounter (Signed)
Called patient. She wants refill of albuterol (sent) and says stiolto is too expensive for her and anoro was not effective. Can try breztri or trelegy. Will inquire about cost with pharmacy team. If triple therapy is cheaper than stiolto ok to prescribe 6 month supply to her pharmacy.

## 2023-03-04 NOTE — Telephone Encounter (Signed)
It seems that the patient has a $200 deductible being applied. The Anoro seems to be the cheaper alternative once the deductible is paid.

## 2023-03-05 ENCOUNTER — Other Ambulatory Visit (HOSPITAL_COMMUNITY): Payer: Self-pay

## 2023-03-05 DIAGNOSIS — J0100 Acute maxillary sinusitis, unspecified: Secondary | ICD-10-CM | POA: Diagnosis not present

## 2023-03-05 NOTE — Telephone Encounter (Signed)
Attempted to call pt but unable to reach. Left message to return call.  

## 2023-03-05 NOTE — Telephone Encounter (Signed)
Per benefits investigation both Trelegy and Judithann Sauger are currently covered under the patients plan. Patient still has a deductible to meet which will contribute to a higher co-pay at this time.

## 2023-03-05 NOTE — Telephone Encounter (Signed)
Please call patient and let her know about inhaler cost.

## 2023-03-05 NOTE — Telephone Encounter (Signed)
Please advise if you would like anoro sent instead of stiolto

## 2023-03-05 NOTE — Telephone Encounter (Signed)
She did not tolerate anoro hence the change. Can we do BIV for trelegy or breztri please?

## 2023-03-05 NOTE — Telephone Encounter (Signed)
Pt returning missed calll

## 2023-03-06 NOTE — Telephone Encounter (Signed)
ATC LVMTCB x 1  

## 2023-03-07 ENCOUNTER — Telehealth: Payer: Self-pay | Admitting: Internal Medicine

## 2023-03-07 NOTE — Telephone Encounter (Signed)
Ret call. Pls try again.

## 2023-03-07 NOTE — Telephone Encounter (Signed)
See other encounter. Will close this encounter.

## 2023-03-07 NOTE — Telephone Encounter (Signed)
ATC patient. LVMTCB. 

## 2023-03-07 NOTE — Telephone Encounter (Signed)
Pt calling back

## 2023-03-12 DIAGNOSIS — Z961 Presence of intraocular lens: Secondary | ICD-10-CM | POA: Diagnosis not present

## 2023-03-18 NOTE — Telephone Encounter (Signed)
Spoke with pt and explained deductible and pt stated understanding. Nothing further needed at this time.

## 2023-03-25 DIAGNOSIS — H26491 Other secondary cataract, right eye: Secondary | ICD-10-CM | POA: Diagnosis not present

## 2023-03-31 DIAGNOSIS — Z961 Presence of intraocular lens: Secondary | ICD-10-CM | POA: Diagnosis not present

## 2023-04-18 ENCOUNTER — Other Ambulatory Visit: Payer: Self-pay | Admitting: Internal Medicine

## 2023-04-18 DIAGNOSIS — Z1231 Encounter for screening mammogram for malignant neoplasm of breast: Secondary | ICD-10-CM

## 2023-04-23 ENCOUNTER — Ambulatory Visit (INDEPENDENT_AMBULATORY_CARE_PROVIDER_SITE_OTHER): Payer: Medicare Other

## 2023-04-23 VITALS — BP 132/81 | HR 62 | Temp 97.5°F | Resp 16 | Ht 69.0 in | Wt 224.6 lb

## 2023-04-23 DIAGNOSIS — I2510 Atherosclerotic heart disease of native coronary artery without angina pectoris: Secondary | ICD-10-CM | POA: Diagnosis not present

## 2023-04-23 DIAGNOSIS — E7800 Pure hypercholesterolemia, unspecified: Secondary | ICD-10-CM | POA: Diagnosis not present

## 2023-04-23 DIAGNOSIS — T466X5A Adverse effect of antihyperlipidemic and antiarteriosclerotic drugs, initial encounter: Secondary | ICD-10-CM

## 2023-04-23 MED ORDER — INCLISIRAN SODIUM 284 MG/1.5ML ~~LOC~~ SOSY
284.0000 mg | PREFILLED_SYRINGE | Freq: Once | SUBCUTANEOUS | Status: AC
  Administered 2023-04-23: 284 mg via SUBCUTANEOUS
  Filled 2023-04-23: qty 1.5

## 2023-04-23 NOTE — Progress Notes (Signed)
Diagnosis: Hyperlipidemia  Provider:  Praveen Mannam MD  Procedure: Injection  Leqvio (inclisiran), Dose: 284 mg, Site: subcutaneous, Number of injections: 1  Post Care: Patient declined observation  Discharge: Condition: Good, Destination: Home . AVS Declined  Performed by:  Jerrika Ledlow, RN       

## 2023-05-05 DIAGNOSIS — E7800 Pure hypercholesterolemia, unspecified: Secondary | ICD-10-CM | POA: Diagnosis not present

## 2023-05-12 DIAGNOSIS — N1831 Chronic kidney disease, stage 3a: Secondary | ICD-10-CM | POA: Diagnosis not present

## 2023-05-12 DIAGNOSIS — M810 Age-related osteoporosis without current pathological fracture: Secondary | ICD-10-CM | POA: Diagnosis not present

## 2023-05-12 DIAGNOSIS — D692 Other nonthrombocytopenic purpura: Secondary | ICD-10-CM | POA: Diagnosis not present

## 2023-05-12 DIAGNOSIS — Z0000 Encounter for general adult medical examination without abnormal findings: Secondary | ICD-10-CM | POA: Diagnosis not present

## 2023-05-12 DIAGNOSIS — I700 Atherosclerosis of aorta: Secondary | ICD-10-CM | POA: Diagnosis not present

## 2023-05-12 DIAGNOSIS — M79641 Pain in right hand: Secondary | ICD-10-CM | POA: Diagnosis not present

## 2023-05-12 DIAGNOSIS — M545 Low back pain, unspecified: Secondary | ICD-10-CM | POA: Diagnosis not present

## 2023-05-12 DIAGNOSIS — F411 Generalized anxiety disorder: Secondary | ICD-10-CM | POA: Diagnosis not present

## 2023-05-12 DIAGNOSIS — J449 Chronic obstructive pulmonary disease, unspecified: Secondary | ICD-10-CM | POA: Diagnosis not present

## 2023-05-12 DIAGNOSIS — F419 Anxiety disorder, unspecified: Secondary | ICD-10-CM | POA: Diagnosis not present

## 2023-05-12 DIAGNOSIS — M79642 Pain in left hand: Secondary | ICD-10-CM | POA: Diagnosis not present

## 2023-05-12 DIAGNOSIS — G2581 Restless legs syndrome: Secondary | ICD-10-CM | POA: Diagnosis not present

## 2023-05-12 DIAGNOSIS — M2550 Pain in unspecified joint: Secondary | ICD-10-CM | POA: Diagnosis not present

## 2023-05-12 DIAGNOSIS — G43009 Migraine without aura, not intractable, without status migrainosus: Secondary | ICD-10-CM | POA: Diagnosis not present

## 2023-05-12 DIAGNOSIS — S22080G Wedge compression fracture of T11-T12 vertebra, subsequent encounter for fracture with delayed healing: Secondary | ICD-10-CM | POA: Diagnosis not present

## 2023-05-27 DIAGNOSIS — R300 Dysuria: Secondary | ICD-10-CM | POA: Diagnosis not present

## 2023-05-31 DIAGNOSIS — Z853 Personal history of malignant neoplasm of breast: Secondary | ICD-10-CM

## 2023-05-31 HISTORY — DX: Personal history of malignant neoplasm of breast: Z85.3

## 2023-06-03 ENCOUNTER — Ambulatory Visit
Admission: RE | Admit: 2023-06-03 | Discharge: 2023-06-03 | Disposition: A | Discharge status: Home / Self Care | Payer: Medicare Other | Source: Ambulatory Visit | Attending: Internal Medicine | Admitting: Internal Medicine

## 2023-06-03 DIAGNOSIS — Z1231 Encounter for screening mammogram for malignant neoplasm of breast: Secondary | ICD-10-CM

## 2023-06-05 ENCOUNTER — Other Ambulatory Visit: Payer: Self-pay | Admitting: Internal Medicine

## 2023-06-05 DIAGNOSIS — R928 Other abnormal and inconclusive findings on diagnostic imaging of breast: Secondary | ICD-10-CM

## 2023-06-06 ENCOUNTER — Encounter: Payer: Self-pay | Admitting: Pulmonary Disease

## 2023-06-11 DIAGNOSIS — M79673 Pain in unspecified foot: Secondary | ICD-10-CM | POA: Diagnosis not present

## 2023-06-11 DIAGNOSIS — M79643 Pain in unspecified hand: Secondary | ICD-10-CM | POA: Diagnosis not present

## 2023-06-11 DIAGNOSIS — M79672 Pain in left foot: Secondary | ICD-10-CM | POA: Diagnosis not present

## 2023-06-11 DIAGNOSIS — M1990 Unspecified osteoarthritis, unspecified site: Secondary | ICD-10-CM | POA: Diagnosis not present

## 2023-06-11 DIAGNOSIS — M79671 Pain in right foot: Secondary | ICD-10-CM | POA: Diagnosis not present

## 2023-06-11 DIAGNOSIS — M79644 Pain in right finger(s): Secondary | ICD-10-CM | POA: Diagnosis not present

## 2023-06-11 DIAGNOSIS — M542 Cervicalgia: Secondary | ICD-10-CM | POA: Diagnosis not present

## 2023-06-11 DIAGNOSIS — M79645 Pain in left finger(s): Secondary | ICD-10-CM | POA: Diagnosis not present

## 2023-06-17 ENCOUNTER — Ambulatory Visit
Admission: RE | Admit: 2023-06-17 | Discharge: 2023-06-17 | Disposition: A | Discharge status: Home / Self Care | Payer: Medicare Other | Source: Ambulatory Visit | Attending: Internal Medicine | Admitting: Internal Medicine

## 2023-06-17 ENCOUNTER — Encounter: Payer: Self-pay | Admitting: Pulmonary Disease

## 2023-06-17 ENCOUNTER — Ambulatory Visit
Admission: RE | Admit: 2023-06-17 | Discharge: 2023-06-17 | Disposition: A | Discharge status: Home / Self Care | Payer: BLUE CROSS/BLUE SHIELD | Source: Ambulatory Visit | Attending: Internal Medicine | Admitting: Internal Medicine

## 2023-06-17 DIAGNOSIS — R928 Other abnormal and inconclusive findings on diagnostic imaging of breast: Secondary | ICD-10-CM

## 2023-06-17 DIAGNOSIS — N6321 Unspecified lump in the left breast, upper outer quadrant: Secondary | ICD-10-CM | POA: Diagnosis not present

## 2023-06-19 ENCOUNTER — Other Ambulatory Visit: Payer: Self-pay | Admitting: Internal Medicine

## 2023-06-19 DIAGNOSIS — N6321 Unspecified lump in the left breast, upper outer quadrant: Secondary | ICD-10-CM

## 2023-06-19 DIAGNOSIS — F330 Major depressive disorder, recurrent, mild: Secondary | ICD-10-CM | POA: Diagnosis not present

## 2023-06-19 DIAGNOSIS — R1011 Right upper quadrant pain: Secondary | ICD-10-CM | POA: Diagnosis not present

## 2023-06-19 DIAGNOSIS — K529 Noninfective gastroenteritis and colitis, unspecified: Secondary | ICD-10-CM | POA: Diagnosis not present

## 2023-06-20 DIAGNOSIS — K529 Noninfective gastroenteritis and colitis, unspecified: Secondary | ICD-10-CM | POA: Diagnosis not present

## 2023-06-20 DIAGNOSIS — R1011 Right upper quadrant pain: Secondary | ICD-10-CM | POA: Diagnosis not present

## 2023-06-23 ENCOUNTER — Ambulatory Visit
Admission: RE | Admit: 2023-06-23 | Discharge: 2023-06-23 | Disposition: A | Discharge status: Home / Self Care | Payer: Medicare Other | Source: Ambulatory Visit | Attending: Internal Medicine | Admitting: Internal Medicine

## 2023-06-23 DIAGNOSIS — N6321 Unspecified lump in the left breast, upper outer quadrant: Secondary | ICD-10-CM

## 2023-06-23 DIAGNOSIS — C50412 Malignant neoplasm of upper-outer quadrant of left female breast: Secondary | ICD-10-CM | POA: Diagnosis not present

## 2023-06-23 HISTORY — PX: BREAST BIOPSY: SHX20

## 2023-06-25 DIAGNOSIS — R3129 Other microscopic hematuria: Secondary | ICD-10-CM | POA: Diagnosis not present

## 2023-06-25 DIAGNOSIS — R1011 Right upper quadrant pain: Secondary | ICD-10-CM | POA: Diagnosis not present

## 2023-06-25 DIAGNOSIS — K760 Fatty (change of) liver, not elsewhere classified: Secondary | ICD-10-CM | POA: Diagnosis not present

## 2023-06-26 ENCOUNTER — Telehealth: Payer: Self-pay | Admitting: Hematology and Oncology

## 2023-06-26 NOTE — Telephone Encounter (Signed)
Spoke to patient to confirm upcoming morning [facility] clinic on appointment  7/3, paperwork will be sent via e-mail.   Gave location and time, also informed patient that the surgeon's office would be calling as well to get information from them similar to the packet that they will be receiving so make sure to do both.  Reminded patient that all providers will be coming to the clinic to see them HERE and if they had any questions to not hesitate to reach back out to myself or their navigators.

## 2023-06-30 ENCOUNTER — Encounter: Payer: Self-pay | Admitting: *Deleted

## 2023-06-30 DIAGNOSIS — Z171 Estrogen receptor negative status [ER-]: Secondary | ICD-10-CM

## 2023-07-01 NOTE — Progress Notes (Signed)
Radiation Oncology         [phone] ________________________________  Multidisciplinary Breast Oncology Clinic [facility]) Initial Outpatient Consultation  Name: [name] [name] MRN: [id number]  Date: 07/02/2023  DOB: 01-07-1951  WU:[id number], Zollie Beckers, MD  Harriette Bouillon, MD   REFERRING PHYSICIAN: Harriette Bouillon, MD  DIAGNOSIS: There were no encounter diagnoses.  Stage *** Left Breast UOQ, *** Carcinoma, ER*** / PR*** / Her2***, Grade ***  No diagnosis found.  HISTORY OF PRESENT ILLNESS::[name] [name] is a 72 y.o. female who is presenting to the office today for evaluation of her newly diagnosed breast cancer. She is accompanied by ***. She is doing well overall.   {She had routine screening mammography on *** showing a possible abnormality in the *** breast.} She underwent bilateral diagnostic mammography with tomography and *** breast ultrasonography at {[facility]} on *** showing: ***.  Biopsy on *** showed: ***. Prognostic indicators significant for: estrogen receptor, ***% {positive or negative} and progesterone receptor, ***% {positive or negative}, both with {include note beside percentage here/strong staining intensity}. Proliferation marker Ki67 at ***%. HER2 {positive or negative}.  Menarche: *** years old Age at first live birth: *** years old GP: *** LMP: *** Contraceptive: *** HRT: ***   The patient was referred today for presentation in the multidisciplinary conference.  Radiology studies and pathology slides were presented there for review and discussion of treatment options.  A consensus was discussed regarding potential next steps.  PREVIOUS RADIATION THERAPY: {EXAM; YES/NO:19492::"No"}  PAST MEDICAL HISTORY:  Past Medical History:  Diagnosis Date   Adenomatous colon polyp    Allergy    Anxiety    Arthritis    bilateral hands. lower back   Cancer (HCC)    skin   Cataract    bilateral   COPD (chronic obstructive pulmonary disease) (HCC)     Depression    Diverticulosis    Endometriosis    GERD (gastroesophageal reflux disease)    Glaucoma    Headache    Migraines   Hiatal hernia    IBS (irritable bowel syndrome)    Inguinal hernia    Pneumothorax 1987   PONV (postoperative nausea and vomiting)     PAST SURGICAL HISTORY: Past Surgical History:  Procedure Laterality Date   ABDOMINAL HYSTERECTOMY  1992   ANTERIOR CERVICAL DECOMP/DISCECTOMY FUSION N/A 09/23/2022   Procedure: Anterior Decompression Fusion,PLATE/SCREWS Cervical five-six; REMOVAL OLD PLATE;  Surgeon: Tressie Stalker, MD;  Location: [facility] OR;  Service: Neurosurgery;  Laterality: N/A;   APPENDECTOMY  1992   BACK SURGERY     cervical   BREAST BIOPSY Left 06/23/2023   [place] LT BREAST BX W LOC DEV 1ST LESION IMG BX SPEC US GUIDE 06/23/2023 [facility] MAMMOGRAPHY   CATARACT EXTRACTION Bilateral    2021, 2022   COLONOSCOPY  2023   PLEURAL SCARIFICATION     ruptured disk     torn tendon     right arm   TUBAL LIGATION      FAMILY HISTORY:  Family History  Problem Relation Age of Onset   Heart failure Mother    Osteoporosis Sister    Crohn's disease Sister    Neuropathy Sister    Thyroid disease Sister    Lung disease Sister    Colon polyps Sister    Thyroid cancer Maternal Grandmother    Colon cancer Neg Hx    Breast cancer Neg Hx    Esophageal cancer Neg Hx    Pancreatic cancer Neg Hx  Stomach cancer Neg Hx     SOCIAL HISTORY:  Social History   Socioeconomic History   Marital status: Married    Spouse name: Not on file   Number of children: 2   Years of education: Not on file   Highest education level: Not on file  Occupational History   Occupation: locksmith  Tobacco Use   Smoking status: Former    Packs/day: 1.50    Years: 40.00    Additional pack years: 0.00    Total pack years: 60.00    Types: Cigarettes    Start date: 12/31/1971    Quit date: 02/10/2012    Years since quitting: 11.3   Smokeless tobacco: Never  Vaping Use   Vaping  Use: Never used  Substance and Sexual Activity   Alcohol use: No   Drug use: No   Sexual activity: Never  Other Topics Concern   Not on file  Social History Narrative   Not on file   Social Determinants of Health   Financial Resource Strain: Not on file  Food Insecurity: Not on file  Transportation Needs: Not on file  Physical Activity: Not on file  Stress: Not on file  Social Connections: Not on file    ALLERGIES:  Allergies  Allergen Reactions   Bupropion     Other reaction(s): headaches   Cefaclor Diarrhea    Other reaction(s): severe diarrhea   Elemental Sulfur Other (See Comments)    Thrush   Sulfa Antibiotics    Latex Other (See Comments) and Rash    blister Other reaction(s): blisters blister    Ofloxacin Diarrhea    MEDICATIONS:  Current Outpatient Medications  Medication Sig Dispense Refill   albuterol (VENTOLIN HFA) 108 (90 Base) MCG/ACT inhaler Inhale 2 puffs into the lungs every 6 (six) hours as needed. 18 g 5   aspirin EC 81 MG tablet Take 81 mg by mouth daily. Swallow whole.     Cholecalciferol (VITAMIN D PO) Take 5,000 Units by mouth daily.      denosumab (PROLIA) 60 MG/ML SOSY injection Inject 60 mg into the skin every 6 (six) months.     famotidine (PEPCID) 20 MG tablet Take 1 tablet (20 mg total) by mouth 2 (two) times daily. As needed (Patient taking differently: Take 20 mg by mouth daily as needed for heartburn or indigestion.) 60 tablet 6   inclisiran (LEQVIO) 284 MG/1.5ML SOSY injection Inject 284 mg into the skin See admin instructions. Every three months     methocarbamol (ROBAXIN-750) 750 MG tablet Take 1 tablet (750 mg total) by mouth 4 (four) times daily. 45 tablet 0   oxyCODONE-acetaminophen (PERCOCET) 5-325 MG tablet Take 1 tablet by mouth every 4 (four) hours as needed for severe pain. 20 tablet 0   Polyethyl Glycol-Propyl Glycol (SYSTANE) 0.4-0.3 % SOLN Place 1 drop into both eyes daily as needed (Dry eye).     promethazine (PHENERGAN)  12.5 MG suppository Place 12.5 mg rectally every 6 (six) hours as needed.     Tiotropium Bromide-Olodaterol (STIOLTO RESPIMAT) 2.5-2.5 MCG/ACT AERS Inhale 2 puffs into the lungs daily. 1 each 5   No current facility-administered medications for this encounter.    REVIEW OF SYSTEMS: A 10+ POINT REVIEW OF SYSTEMS WAS OBTAINED including neurology, dermatology, psychiatry, cardiac, respiratory, lymph, extremities, GI, GU, musculoskeletal, constitutional, reproductive, HEENT. On the provided form, she reports ***. She denies *** and any other symptoms.    PHYSICAL EXAM:  vitals were not taken for this visit.  {  may need to copy over vitals} Lungs are clear to auscultation bilaterally. Heart has regular rate and rhythm. No palpable cervical, supraclavicular, or axillary adenopathy. Abdomen soft, non-tender, normal bowel sounds. Breast: *** breast with no palpable mass, nipple discharge, or bleeding. *** breast with ***.   KPS = ***  100 - Normal; no complaints; no evidence of disease. 90   - Able to carry on normal activity; minor signs or symptoms of disease. 80   - Normal activity with effort; some signs or symptoms of disease. 64   - Cares for self; unable to carry on normal activity or to do active work. 60   - Requires occasional assistance, but is able to care for most of his personal needs. 50   - Requires considerable assistance and frequent medical care. 40   - Disabled; requires special care and assistance. 30   - Severely disabled; hospital admission is indicated although death not imminent. 20   - Very sick; hospital admission necessary; active supportive treatment necessary. 10   - Moribund; fatal processes progressing rapidly. 0     - Dead  Karnofsky DA, Abelmann WH, Craver LS and Burchenal [facility] [phone]) The use of the nitrogen mustards in the palliative treatment of carcinoma: with particular reference to bronchogenic carcinoma Cancer 1 634-56  LABORATORY DATA:  Lab Results   Component Value Date   WBC 6.0 09/16/2022   HGB 14.3 09/16/2022   HCT 43.4 09/16/2022   MCV 94.3 09/16/2022   PLT 291 09/16/2022   Lab Results  Component Value Date   NA 142 10/20/2017   K 4.7 10/20/2017   CL 106 10/20/2017   CO2 28 10/20/2017   Lab Results  Component Value Date   ALT 10 10/20/2017   AST 13 10/20/2017   ALKPHOS 77 10/20/2017   BILITOT 0.4 10/20/2017    PULMONARY FUNCTION TEST:   Review Flowsheet       Latest Ref Rng & Units 04/29/2022  Spirometry  FVC-%Pred-Pre % 72   FVC-%Pred-Post % 71   FEV1-Pre L 1.89   FEV1-%Pred-Pre % 65   FEV1-Post L 2.00   FEV1-%Pred-Post % 69   DLCO unc ml/min/mmHg 18.34     RADIOGRAPHY: [place] LT BREAST BX W LOC DEV 1ST LESION IMG BX SPEC US GUIDE  Addendum Date: 06/25/2023   ADDENDUM REPORT: 06/25/2023 14:01 ADDENDUM: PATHOLOGY revealed: Site Breast, LEFT, needle core biopsy, upper outer quadrant, 1 o'clock, 4 cm fn (ribbon clip) INVASIVE POORLY DIFFERENTIATED DUCTAL CARCINOMA, GRADE 3 (3+3+3) FOCAL HIGH-GRADE DUCTAL CARCINOMA IN SITU, CRIBRIFORM TYPE WITH FOCAL NECROSIS TUBULE FORMATION: SCORE 3 NUCLEAR PLEOMORPHISM: SCORE 3 MITOTIC COUNT: SCORE 3 TOTAL SCORE: 9 OVERALL GRADE GRADE 3 (9/9). NEGATIVE FOR ANGIOLYMPHATIC INVASION NEGATIVE FOR MICROCALCIFICATIONS. TUMOR MEASURES 5 MM IN GREATEST LINEAR EXTENT Pathology results are CONCORDANT with imaging findings, per Dr. Quincy Carnes. Pathology results and recommendations below were discussed with patient by telephone on 06/25/2023. Patient reported biopsy site within normal limits with slight tenderness at the site. Post biopsy care instructions were reviewed, questions were answered and my direct phone number was provided to patient. Patient was instructed to call [facility] if any concerns or questions arise related to the biopsy. The patient was referred to the [facility] at [facility] with appointment  on 07/02/2023. Pathology results reported by Lynett Grimes, RN on 06/25/2023. Electronically Signed   By: Hulan Saas M.D.   On: 06/25/2023 14:01   Result Date:  06/25/2023 CLINICAL DATA:  72 year old with a screening detected highly suspicious 2.2 cm mass involving the UPPER OUTER QUADRANT of the LEFT breast at 1 o'clock 4 cm from nipple. EXAM: ULTRASOUND GUIDED LEFT BREAST CORE NEEDLE BIOPSY COMPARISON:  Previous exam(s). PROCEDURE: I met with the patient and we discussed the procedure of ultrasound-guided biopsy, including benefits and alternatives. We discussed the high likelihood of a successful procedure. We discussed the risks of the procedure, including infection, bleeding, tissue injury, clip migration, and inadequate sampling. Informed written consent was given. The usual time-out protocol was performed immediately prior to the procedure. Lesion quadrant: UPPER OUTER QUADRANT. Using sterile technique with chlorhexidine as skin antisepsis, 1% Lidocaine as local anesthetic, under direct ultrasound visualization, a 12 gauge Bard Marquee core needle device placed through an 11 gauge introducer needle was used to perform biopsy of the mass in the UPPER OUTER QUADRANT using a lateral approach. At the conclusion of the procedure, a ribbon shaped tissue marker clip was deployed into the biopsy cavity. The patient tolerated the procedure well without apparent immediate complications. Follow up 2 view mammogram was performed and dictated separately. IMPRESSION: Ultrasound guided core needle biopsy of a suspicious 2.2 cm mass in the UPPER OUTER QUADRANT of the LEFT breast. Electronically Signed: By: Hulan Saas M.D. On: 06/23/2023 10:18  MM CLIP PLACEMENT LEFT  Result Date: 06/23/2023 CLINICAL DATA:  Confirmation of clip placement after ultrasound-guided core needle biopsy of a suspicious mass involving the UPPER OUTER QUADRANT of the LEFT breast. EXAM: 2D and 3D DIAGNOSTIC LEFT MAMMOGRAM POST ULTRASOUND  BIOPSY COMPARISON:  Previous exam(s). FINDINGS: 2D and 3D full field CC and mediolateral mammographic images were obtained following ultrasound guided biopsy of a suspicious mass in the UPPER OUTER QUADRANT of the LEFT breast. The ribbon shaped tissue marking clip is appropriately positioned within the biopsied mass. Expected post biopsy changes are present without evidence of hematoma. IMPRESSION: Appropriate positioning of the ribbon shaped biopsy marking clip within the biopsied mass in the UPPER OUTER QUADRANT of the LEFT breast. Final Assessment: Post Procedure Mammograms for Marker Placement Electronically Signed   By: Hulan Saas M.D.   On: 06/23/2023 10:18  MM 3D DIAGNOSTIC MAMMOGRAM UNILATERAL LEFT BREAST  Result Date: 06/17/2023 CLINICAL DATA:  72 year old female presents for further evaluation of possible LEFT breast mass on screening mammogram. EXAM: DIGITAL DIAGNOSTIC UNILATERAL LEFT MAMMOGRAM WITH TOMOSYNTHESIS; ULTRASOUND LEFT BREAST LIMITED TECHNIQUE: Left digital diagnostic mammography and breast tomosynthesis was performed.; Targeted ultrasound examination of the left breast was performed. COMPARISON:  Previous exam(s). ACR Breast Density Category c: The breasts are heterogeneously dense, which may obscure small masses. FINDINGS: Spot compression views of the LEFT breast demonstrate a persistent irregular mass within the UPPER-OUTER LEFT breast. Targeted ultrasound is performed, showing a 1.7 x 1.4 x 2.2 cm irregular hypoechoic mass at the 1 o'clock position of the LEFT breast 4 cm from the nipple. No abnormal LEFT axillary lymph nodes are identified. IMPRESSION: 1. Highly suspicious 2.2 cm UPPER-OUTER LEFT breast mass. Tissue sampling is recommended. No abnormal appearing LEFT axillary lymph nodes. RECOMMENDATION: Ultrasound-guided LEFT breast biopsy, which will be scheduled. I have discussed the findings and recommendations with the patient. If applicable, a reminder letter will be sent  to the patient regarding the next appointment. BI-RADS CATEGORY  5: Highly suggestive of malignancy. Electronically Signed   By: Harmon Pier M.D.   On: 06/17/2023 10:55  [place] LIMITED ULTRASOUND INCLUDING AXILLA LEFT BREAST   Result Date: 06/17/2023 CLINICAL DATA:  72 year old female presents for further evaluation of possible LEFT breast mass on screening mammogram. EXAM: DIGITAL DIAGNOSTIC UNILATERAL LEFT MAMMOGRAM WITH TOMOSYNTHESIS; ULTRASOUND LEFT BREAST LIMITED TECHNIQUE: Left digital diagnostic mammography and breast tomosynthesis was performed.; Targeted ultrasound examination of the left breast was performed. COMPARISON:  Previous exam(s). ACR Breast Density Category c: The breasts are heterogeneously dense, which may obscure small masses. FINDINGS: Spot compression views of the LEFT breast demonstrate a persistent irregular mass within the UPPER-OUTER LEFT breast. Targeted ultrasound is performed, showing a 1.7 x 1.4 x 2.2 cm irregular hypoechoic mass at the 1 o'clock position of the LEFT breast 4 cm from the nipple. No abnormal LEFT axillary lymph nodes are identified. IMPRESSION: 1. Highly suspicious 2.2 cm UPPER-OUTER LEFT breast mass. Tissue sampling is recommended. No abnormal appearing LEFT axillary lymph nodes. RECOMMENDATION: Ultrasound-guided LEFT breast biopsy, which will be scheduled. I have discussed the findings and recommendations with the patient. If applicable, a reminder letter will be sent to the patient regarding the next appointment. BI-RADS CATEGORY  5: Highly suggestive of malignancy. Electronically Signed   By: Harmon Pier M.D.   On: 06/17/2023 10:55  MM 3D SCREENING MAMMOGRAM BILATERAL BREAST  Result Date: 06/04/2023 CLINICAL DATA:  Screening. EXAM: DIGITAL SCREENING BILATERAL MAMMOGRAM WITH TOMOSYNTHESIS AND CAD TECHNIQUE: Bilateral screening digital craniocaudal and mediolateral oblique mammograms were obtained. Bilateral screening digital breast tomosynthesis was performed.  The images were evaluated with computer-aided detection. COMPARISON:  Previous exam(s). ACR Breast Density Category c: The breasts are heterogeneously dense, which may obscure small masses. FINDINGS: In the left breast, a possible mass warrants further evaluation. In the right breast, no findings suspicious for malignancy. IMPRESSION: Further evaluation is suggested for a possible mass in the left breast. RECOMMENDATION: Diagnostic mammogram and possibly ultrasound of the left breast. (Code:[id number]-[id number]) The patient will be contacted regarding the findings, and additional imaging will be scheduled. BI-RADS CATEGORY  0: Incomplete: Need additional imaging evaluation. Electronically Signed   By: Edwin Cap M.D.   On: 06/04/2023 07:59      IMPRESSION: ***   Patient will be a good candidate for breast conservation with radiotherapy to the left breast. We discussed the general course of radiation, potential side effects, and toxicities with radiation and the patient is interested in this approach. ***   PLAN:  ***    ------------------------------------------------  Billie Lade, PhD, MD  This document serves as a record of services personally performed by Antony Blackbird, MD. It was created on his behalf by Neena Rhymes, a trained medical scribe. The creation of this record is based on the scribe's personal observations and the provider's statements to them. This document has been checked and approved by the attending provider.

## 2023-07-02 ENCOUNTER — Ambulatory Visit: Payer: Self-pay | Admitting: Surgery

## 2023-07-02 ENCOUNTER — Encounter: Payer: Self-pay | Admitting: Physical Therapy

## 2023-07-02 ENCOUNTER — Inpatient Hospital Stay (HOSPITAL_BASED_OUTPATIENT_CLINIC_OR_DEPARTMENT_OTHER): Payer: Medicare Other | Admitting: Hematology and Oncology

## 2023-07-02 ENCOUNTER — Other Ambulatory Visit: Payer: Self-pay

## 2023-07-02 ENCOUNTER — Encounter: Payer: Self-pay | Admitting: Pulmonary Disease

## 2023-07-02 ENCOUNTER — Encounter: Payer: Self-pay | Admitting: *Deleted

## 2023-07-02 ENCOUNTER — Inpatient Hospital Stay: Payer: Medicare Other | Admitting: Licensed Clinical Social Worker

## 2023-07-02 ENCOUNTER — Encounter: Payer: Self-pay | Admitting: Hematology and Oncology

## 2023-07-02 ENCOUNTER — Ambulatory Visit: Payer: Medicare Other | Attending: Surgery | Admitting: Physical Therapy

## 2023-07-02 ENCOUNTER — Ambulatory Visit
Admission: RE | Admit: 2023-07-02 | Discharge: 2023-07-02 | Disposition: A | Discharge status: Home / Self Care | Payer: Medicare Other | Source: Ambulatory Visit | Attending: Radiation Oncology | Admitting: Radiation Oncology

## 2023-07-02 ENCOUNTER — Inpatient Hospital Stay: Payer: Medicare Other | Attending: Hematology and Oncology

## 2023-07-02 ENCOUNTER — Encounter: Payer: Self-pay | Admitting: Genetic Counselor

## 2023-07-02 ENCOUNTER — Inpatient Hospital Stay (HOSPITAL_BASED_OUTPATIENT_CLINIC_OR_DEPARTMENT_OTHER): Payer: Medicare Other | Admitting: Genetic Counselor

## 2023-07-02 VITALS — BP 143/70 | HR 66 | Temp 97.3°F | Resp 16 | Wt 223.1 lb

## 2023-07-02 DIAGNOSIS — C50412 Malignant neoplasm of upper-outer quadrant of left female breast: Secondary | ICD-10-CM

## 2023-07-02 DIAGNOSIS — Z820 Family history of epilepsy and other diseases of the nervous system: Secondary | ICD-10-CM | POA: Diagnosis not present

## 2023-07-02 DIAGNOSIS — Z83719 Family history of colon polyps, unspecified: Secondary | ICD-10-CM | POA: Insufficient documentation

## 2023-07-02 DIAGNOSIS — Z881 Allergy status to other antibiotic agents status: Secondary | ICD-10-CM | POA: Insufficient documentation

## 2023-07-02 DIAGNOSIS — Z8349 Family history of other endocrine, nutritional and metabolic diseases: Secondary | ICD-10-CM | POA: Diagnosis not present

## 2023-07-02 DIAGNOSIS — K580 Irritable bowel syndrome with diarrhea: Secondary | ICD-10-CM | POA: Insufficient documentation

## 2023-07-02 DIAGNOSIS — Z825 Family history of asthma and other chronic lower respiratory diseases: Secondary | ICD-10-CM | POA: Diagnosis not present

## 2023-07-02 DIAGNOSIS — J449 Chronic obstructive pulmonary disease, unspecified: Secondary | ICD-10-CM | POA: Diagnosis not present

## 2023-07-02 DIAGNOSIS — Z8379 Family history of other diseases of the digestive system: Secondary | ICD-10-CM | POA: Insufficient documentation

## 2023-07-02 DIAGNOSIS — Z171 Estrogen receptor negative status [ER-]: Secondary | ICD-10-CM

## 2023-07-02 DIAGNOSIS — Z8269 Family history of other diseases of the musculoskeletal system and connective tissue: Secondary | ICD-10-CM | POA: Diagnosis not present

## 2023-07-02 DIAGNOSIS — R293 Abnormal posture: Secondary | ICD-10-CM | POA: Diagnosis not present

## 2023-07-02 DIAGNOSIS — Z8249 Family history of ischemic heart disease and other diseases of the circulatory system: Secondary | ICD-10-CM | POA: Insufficient documentation

## 2023-07-02 DIAGNOSIS — Z79899 Other long term (current) drug therapy: Secondary | ICD-10-CM

## 2023-07-02 DIAGNOSIS — Z87891 Personal history of nicotine dependence: Secondary | ICD-10-CM | POA: Diagnosis not present

## 2023-07-02 DIAGNOSIS — Z90710 Acquired absence of both cervix and uterus: Secondary | ICD-10-CM | POA: Insufficient documentation

## 2023-07-02 DIAGNOSIS — Z9049 Acquired absence of other specified parts of digestive tract: Secondary | ICD-10-CM | POA: Diagnosis not present

## 2023-07-02 DIAGNOSIS — Z888 Allergy status to other drugs, medicaments and biological substances status: Secondary | ICD-10-CM

## 2023-07-02 DIAGNOSIS — C50912 Malignant neoplasm of unspecified site of left female breast: Secondary | ICD-10-CM

## 2023-07-02 DIAGNOSIS — F419 Anxiety disorder, unspecified: Secondary | ICD-10-CM

## 2023-07-02 DIAGNOSIS — Z882 Allergy status to sulfonamides status: Secondary | ICD-10-CM | POA: Diagnosis not present

## 2023-07-02 LAB — CBC WITH DIFFERENTIAL (CANCER CENTER ONLY)
Abs Immature Granulocytes: 0.03 10*3/uL (ref 0.00–0.07)
Basophils Absolute: 0 10*3/uL (ref 0.0–0.1)
Basophils Relative: 1 %
Eosinophils Absolute: 0.2 10*3/uL (ref 0.0–0.5)
Eosinophils Relative: 2 %
HCT: 42.8 % (ref 36.0–46.0)
Hemoglobin: 14.1 g/dL (ref 12.0–15.0)
Immature Granulocytes: 0 %
Lymphocytes Relative: 20 %
Lymphs Abs: 1.6 10*3/uL (ref 0.7–4.0)
MCH: 31.4 pg (ref 26.0–34.0)
MCHC: 32.9 g/dL (ref 30.0–36.0)
MCV: 95.3 fL (ref 80.0–100.0)
Monocytes Absolute: 0.5 10*3/uL (ref 0.1–1.0)
Monocytes Relative: 6 %
Neutro Abs: 5.6 10*3/uL (ref 1.7–7.7)
Neutrophils Relative %: 71 %
Platelet Count: 254 10*3/uL (ref 150–400)
RBC: 4.49 MIL/uL (ref 3.87–5.11)
RDW: 15 % (ref 11.5–15.5)
WBC Count: 7.9 10*3/uL (ref 4.0–10.5)
nRBC: 0 % (ref 0.0–0.2)

## 2023-07-02 LAB — CMP (CANCER CENTER ONLY)
ALT: 12 U/L (ref 0–44)
AST: 14 U/L — ABNORMAL LOW (ref 15–41)
Albumin: 3.9 g/dL (ref 3.5–5.0)
Alkaline Phosphatase: 68 U/L (ref 38–126)
Anion gap: 6 (ref 5–15)
BUN: 16 mg/dL (ref 8–23)
CO2: 29 mmol/L (ref 22–32)
Calcium: 9.5 mg/dL (ref 8.9–10.3)
Chloride: 107 mmol/L (ref 98–111)
Creatinine: 1.13 mg/dL — ABNORMAL HIGH (ref 0.44–1.00)
GFR, Estimated: 52 mL/min — ABNORMAL LOW (ref >60)
Glucose, Bld: 93 mg/dL (ref 70–99)
Potassium: 4.6 mmol/L (ref 3.5–5.1)
Sodium: 142 mmol/L (ref 135–145)
Total Bilirubin: 0.4 mg/dL (ref 0.3–1.2)
Total Protein: 6.6 g/dL (ref 6.5–8.1)

## 2023-07-02 LAB — GENETIC SCREENING ORDER

## 2023-07-02 NOTE — Progress Notes (Signed)
[facility]cer Center CONSULT NOTE  Patient Care Team: Merri Brunette, MD as PCP - General (Internal Medicine) Rachel Moulds, MD as Consulting Physician (Hematology and Oncology) Harriette Bouillon, MD as Consulting Physician (General Surgery) Antony Blackbird, MD as Consulting Physician (Radiation Oncology) Donnelly Angelica, RN as Oncology Nurse Navigator Pershing Proud, RN as Oncology Nurse Navigator  CHIEF COMPLAINTS/PURPOSE OF CONSULTATION:  Newly diagnosed breast cancer  HISTORY OF PRESENTING ILLNESS:  [name] [name] 72 y.o. female is here because of recent diagnosis of left breast cancer  I reviewed her records extensively and collaborated the history with the patient.  SUMMARY OF ONCOLOGIC HISTORY: Oncology History  Malignant neoplasm of upper-outer quadrant of left breast in female, estrogen receptor negative (HCC)  06/03/2023 Mammogram   Screening mammogram bilaterally showed possible mass in the left breast warranting further evaluation.  Diagnostic mammogram confirmed highly suspicious 2.2 cm upper outer left breast mass, no abnormal appearing left axillary lymph nodes   06/23/2023 Pathology Results   Pathology results from the left breast needle core biopsy showed grade 3 invasive poorly differentiated ductal carcinoma, focal high-grade DCIS, cribriform type, negative for angiolymphatic invasion, negative for micro calcs, prognostic showed ER 0% negative, PR 0% negative, HER2 negative and Ki-67 of 95%.   06/30/2023 Initial Diagnosis   Malignant neoplasm of upper-outer quadrant of left breast in female, estrogen receptor negative (HCC)    This is a pleasant 72 year old female patient with past medical history significant for COPD, not oxygen dependent, irritable bowel syndrome, diarrhea predominant depression, well-controlled on Zoloft, anxiety, arthritis referred to breast [facility] for new diagnosis of left breast invasive ductal carcinoma.  She arrived to the appointment today with  her husband.  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  She is very worried about the possibility of having to get through chemotherapy.  She already has significant diarrhea issues.  No family history of breast cancer, ovarian or pancreatic cancer.  There is a grandmother who had thyroid cancer and a sister with lung cancer, sister is a smoker.  She was also heavy smoker but quit many years ago back in 2013.  Prior to that she smoked 2 packs/day for about 40 years.  She denies any known history of diabetes, hypertension or coronary artery disease.  Rest of the pertinent 10 point ROS reviewed and negative  MEDICAL HISTORY:  Past Medical History:  Diagnosis Date   Adenomatous colon polyp    Allergy    Anxiety    Arthritis    bilateral hands. lower back   Cancer (HCC)    skin   Cataract    bilateral   COPD (chronic obstructive pulmonary disease) (HCC)    Depression    Diverticulosis    Endometriosis    GERD (gastroesophageal reflux disease)    Glaucoma    Headache    Migraines   Hiatal hernia    IBS (irritable bowel syndrome)    Inguinal hernia    Pneumothorax 1987   PONV (postoperative nausea and vomiting)     SURGICAL HISTORY: Past Surgical History:  Procedure Laterality Date   ABDOMINAL HYSTERECTOMY  1992   ANTERIOR CERVICAL DECOMP/DISCECTOMY FUSION N/A 09/23/2022   Procedure: Anterior Decompression Fusion,PLATE/SCREWS Cervical five-six; REMOVAL OLD PLATE;  Surgeon: Tressie Stalker, MD;  Location: [facility] OR;  Service: Neurosurgery;  Laterality: N/A;   APPENDECTOMY  1992   BACK SURGERY     cervical   BREAST BIOPSY Left 06/23/2023  [place] LT BREAST BX W LOC DEV 1ST LESION IMG BX SPEC US GUIDE 06/23/2023 [facility] MAMMOGRAPHY   CATARACT EXTRACTION Bilateral    2021, 2022   COLONOSCOPY  2023   PLEURAL SCARIFICATION     ruptured disk     torn tendon     right arm   TUBAL LIGATION      SOCIAL HISTORY: Social History   Socioeconomic History    Marital status: Married    Spouse name: Not on file   Number of children: 2   Years of education: Not on file   Highest education level: Not on file  Occupational History   Occupation: locksmith  Tobacco Use   Smoking status: Former    Packs/day: 1.50    Years: 40.00    Additional pack years: 0.00    Total pack years: 60.00    Types: Cigarettes    Start date: 12/31/1971    Quit date: 02/10/2012    Years since quitting: 11.3   Smokeless tobacco: Never  Vaping Use   Vaping Use: Never used  Substance and Sexual Activity   Alcohol use: No   Drug use: No   Sexual activity: Never  Other Topics Concern   Not on file  Social History Narrative   Not on file   Social Determinants of Health   Financial Resource Strain: Not on file  Food Insecurity: Not on file  Transportation Needs: Not on file  Physical Activity: Not on file  Stress: Not on file  Social Connections: Not on file  Intimate Partner Violence: Not on file    FAMILY HISTORY: Family History  Problem Relation Age of Onset   Heart failure Mother    Osteoporosis Sister    Crohn's disease Sister    Neuropathy Sister    Thyroid disease Sister    Lung disease Sister    Colon polyps Sister    Thyroid cancer Maternal Grandmother    Colon cancer Neg Hx    Breast cancer Neg Hx    Esophageal cancer Neg Hx    Pancreatic cancer Neg Hx    Stomach cancer Neg Hx     ALLERGIES:  is allergic to bupropion, cefaclor, elemental sulfur, sulfa antibiotics, latex, and ofloxacin.  MEDICATIONS:  Current Outpatient Medications  Medication Sig Dispense Refill   albuterol (VENTOLIN HFA) 108 (90 Base) MCG/ACT inhaler Inhale 2 puffs into the lungs every 6 (six) hours as needed. 18 g 5   aspirin EC 81 MG tablet Take 81 mg by mouth daily. Swallow whole.     Cholecalciferol (VITAMIN D PO) Take 5,000 Units by mouth daily.      denosumab (PROLIA) 60 MG/ML SOSY injection Inject 60 mg into the skin every 6 (six) months.     famotidine  (PEPCID) 20 MG tablet Take 1 tablet (20 mg total) by mouth 2 (two) times daily. As needed (Patient taking differently: Take 20 mg by mouth daily as needed for heartburn or indigestion.) 60 tablet 6   inclisiran (LEQVIO) 284 MG/1.5ML SOSY injection Inject 284 mg into the skin See admin instructions. Every three months     methocarbamol (ROBAXIN-750) 750 MG tablet Take 1 tablet (750 mg total) by mouth 4 (four) times daily. 45 tablet 0   oxyCODONE-acetaminophen (PERCOCET) 5-325 MG tablet Take 1 tablet by mouth every 4 (four) hours as needed for severe pain. 20 tablet 0   Polyethyl Glycol-Propyl Glycol (SYSTANE) 0.4-0.3 % SOLN Place 1 drop into both eyes daily as needed (Dry eye).  promethazine (PHENERGAN) 12.5 MG suppository Place 12.5 mg rectally every 6 (six) hours as needed.     Tiotropium Bromide-Olodaterol (STIOLTO RESPIMAT) 2.5-2.5 MCG/ACT AERS Inhale 2 puffs into the lungs daily. 1 each 5   No current facility-administered medications for this visit.    REVIEW OF SYSTEMS:   Constitutional: Denies fevers, chills or abnormal night sweats Eyes: Denies blurriness of vision, double vision or watery eyes Ears, nose, mouth, throat, and face: Denies mucositis or sore throat Respiratory: Denies cough, dyspnea or wheezes Cardiovascular: Denies palpitation, chest discomfort or lower extremity swelling Gastrointestinal:  Denies nausea, heartburn or change in bowel habits Skin: Denies abnormal skin rashes Lymphatics: Denies new lymphadenopathy or easy bruising Neurological:Denies numbness, tingling or new weaknesses Behavioral/Psych: Mood is stable, no new changes  All other systems were reviewed with the patient and are negative.  PHYSICAL EXAMINATION: ECOG PERFORMANCE STATUS: 0 - Asymptomatic  Vitals:   07/02/23 0841  BP: (!) 143/70  Pulse: 66  Resp: 16  Temp: (!) 97.3 F (36.3 C)  SpO2: 95%   Filed Weights   07/02/23 0841  Weight: 223 lb 1.6 oz (101.2 kg)    GENERAL:alert, no  distress and comfortable SKIN: skin color, texture, turgor are normal, no rashes or significant lesions EYES: normal, conjunctiva are pink and non-injected, sclera clear OROPHARYNX:no exudate, no erythema and lips, buccal mucosa, and tongue normal  NECK: supple, thyroid normal size, non-tender, without nodularity LYMPH:  no palpable lymphadenopathy in the cervical, axillary LUNGS: clear to auscultation and percussion with normal breathing effort HEART: regular rate & rhythm and no murmurs and no lower extremity edema ABDOMEN:abdomen soft, non-tender and normal bowel sounds Musculoskeletal:no cyanosis of digits and no clubbing  PSYCH: alert & oriented x 3 with fluent speech NEURO: no focal motor/sensory deficits  LABORATORY DATA:  I have reviewed the data as listed Lab Results  Component Value Date   WBC 7.9 07/02/2023   HGB 14.1 07/02/2023   HCT 42.8 07/02/2023   MCV 95.3 07/02/2023   PLT 254 07/02/2023   Lab Results  Component Value Date   NA 142 07/02/2023   K 4.6 07/02/2023   CL 107 07/02/2023   CO2 29 07/02/2023    RADIOGRAPHIC STUDIES: I have personally reviewed the radiological reports and agreed with the findings in the report.  ASSESSMENT AND PLAN:  Malignant neoplasm of upper-outer quadrant of left breast in female, estrogen receptor negative (HCC) This is a pleasant 72 year old postmenopausal female patient with newly diagnosed left breast T2 N0 triple negative invasive ductal carcinoma referred to breast [facility] for additional recommendations.  Given triple negative biology and size of the tumor over 2 cm, we have discussed about both neoadjuvant and adjuvant approach. I have discussed neoadjuvant chemotherapy options including keynote 522 versus SCARLET trial versus adjuvant approach.  She has decent performance status but is mostly limited by her diarrhea predominant IBS as well as COPD given her prior history of smoking.  I do not believe she will be an excellent  candidate for anthracycline-based regimen which could be part of the trial as well as keynote 522.  We have hence focused our discussion on proceeding with upfront surgery followed by adjuvant chemotherapy with TC every 21 days for 4 cycles.  We have discussed about the regimen which consist of docetaxel and cyclophosphamide every 21 days for 4 cycles.  We have reviewed the adverse effects from chemotherapy including but not limited to hair loss, fatigue, nausea, vomiting, constipation, diarrhea, cytopenias neuropathy etc.  She  understands that some of the side effects can be life-threatening as well as permanent.  She is understandably anxious and upset over the fact that she will need chemotherapy despite neoadjuvant or adjuvant.  She will however want to proceed with surgery first followed by consideration for adjuvant chemotherapy.  We have reviewed the importance of chemo and triple negative biology, the role is to reduce the risk of recurrence, distant metastatic disease.  No role for antiestrogens.  She can return to clinic after surgery to initiate adjuvant chemotherapy.  Genetic counseling will also be arranged Return to clinic in 2 weeks after surgery to start chemotherapy.     All questions were answered. The patient knows to call the clinic with any problems, questions or concerns.    Rachel Moulds, MD 07/02/23

## 2023-07-02 NOTE — Assessment & Plan Note (Addendum)
This is a pleasant 72 year old postmenopausal female patient with newly diagnosed left breast T2 N0 triple negative invasive ductal carcinoma referred to [facility] for additional recommendations.  Given triple negative biology and size of the tumor over 2 cm, we have discussed about both neoadjuvant and adjuvant approach. I have discussed neoadjuvant chemotherapy options including keynote 522 versus SCARLET trial versus adjuvant approach.  She has decent performance status but is mostly limited by her diarrhea predominant IBS as well as COPD given her prior history of smoking.  I do not believe she will be an excellent candidate for anthracycline-based regimen which could be part of the trial as well as keynote 522.  We have hence focused our discussion on proceeding with upfront surgery followed by adjuvant chemotherapy with TC every 21 days for 4 cycles.  We have discussed about the regimen which consist of docetaxel and cyclophosphamide every 21 days for 4 cycles.  We have reviewed the adverse effects from chemotherapy including but not limited to hair loss, fatigue, nausea, vomiting, constipation, diarrhea, cytopenias neuropathy etc.  She understands that some of the side effects can be life-threatening as well as permanent.  She is understandably anxious and upset over the fact that she will need chemotherapy despite neoadjuvant or adjuvant.  She will however want to proceed with surgery first followed by consideration for adjuvant chemotherapy.  We have reviewed the importance of chemo and triple negative biology, the role is to reduce the risk of recurrence, distant metastatic disease.  No role for antiestrogens.  She can return to clinic after surgery to initiate adjuvant chemotherapy.  Genetic counseling will also be arranged Return to clinic in 2 weeks after surgery to start chemotherapy.

## 2023-07-02 NOTE — Addendum Note (Signed)
Addended by: Billey Co on: 07/02/2023 11:00 AM   Modules accepted: Orders

## 2023-07-02 NOTE — Therapy (Signed)
OUTPATIENT PHYSICAL THERAPY BREAST CANCER BASELINE EVALUATION   Patient Name: [name] [name] MRN: [id number] DOB:07/04/1951, 72 y.o., female Today's Date: 07/02/2023  END OF SESSION:  PT End of Session - 07/02/23 1151     Visit Number 1    Number of Visits 2    Date for PT Re-Evaluation 01/02/24    PT Start Time 0957    PT Stop Time 1030    PT Time Calculation (min) 33 min    Activity Tolerance Patient tolerated treatment well    Behavior During Therapy WFL for tasks assessed/performed             Past Medical History:  Diagnosis Date   Adenomatous colon polyp    Allergy    Anxiety    Arthritis    bilateral hands. lower back   Cancer [facility])    skin   Cataract    bilateral   COPD (chronic obstructive pulmonary disease) (HCC)    Depression    Diverticulosis    Endometriosis    GERD (gastroesophageal reflux disease)    Glaucoma    Headache    Migraines   Hiatal hernia    IBS (irritable bowel syndrome)    Inguinal hernia    Pneumothorax 1987   PONV (postoperative nausea and vomiting)    Past Surgical History:  Procedure Laterality Date   ABDOMINAL HYSTERECTOMY  1992   ANTERIOR CERVICAL DECOMP/DISCECTOMY FUSION N/A 09/23/2022   Procedure: Anterior Decompression Fusion,PLATE/SCREWS Cervical five-six; REMOVAL OLD PLATE;  Surgeon: Tressie Stalker, MD;  Location: [facility] OR;  Service: Neurosurgery;  Laterality: N/A;   APPENDECTOMY  1992   BACK SURGERY     cervical   BREAST BIOPSY Left 06/23/2023   [place] LT BREAST BX W LOC DEV 1ST LESION IMG BX SPEC US GUIDE 06/23/2023 [facility]   CATARACT EXTRACTION Bilateral    2021, 2022   COLONOSCOPY  2023   PLEURAL SCARIFICATION     ruptured disk     torn tendon     right arm   TUBAL LIGATION     Patient Active Problem List   Diagnosis Date Noted   Malignant neoplasm of upper-outer quadrant of left breast in female, estrogen receptor negative (HCC) 06/30/2023   Cervical spondylosis with radiculopathy 09/23/2022    Elevated low-density lipoprotein level 06/14/2022   Coronary arteriosclerosis 06/14/2022   Adverse reaction to antihyperlipidemic drug, initial encounter 06/14/2022   Diarrhea 01/27/2019   Change in bowel habits 01/05/2019   Left inguinal hernia 06/27/2014   Bloating 05/17/2013   Unspecified constipation 05/17/2013   Pneumothorax 10/17/2008   HIATAL HERNIA 10/17/2008   DIVERTICULOSIS, COLON 10/17/2008   ENDOMETRIOSIS 10/17/2008   LLQ abdominal pain 10/17/2008   DEPRESSION, HX OF 10/17/2008   COLONIC POLYPS, ADENOMATOUS, HX OF 10/17/2008   PNEUMOTHORAX 10/17/2008   REFERRING PROVIDER: Dr. Harriette Bouillon  REFERRING DIAG: Left breast cancer  THERAPY DIAG:  Malignant neoplasm of upper-outer quadrant of left breast in female, estrogen receptor negative (HCC)  Abnormal posture  Rationale for Evaluation and Treatment: Rehabilitation  ONSET DATE: 08/15/2023  SUBJECTIVE:  SUBJECTIVE STATEMENT: Patient reports she is here today to be seen by her medical team for her newly diagnosed left breast cancer.   PERTINENT HISTORY:  Patient was diagnosed on 08/15/2023 with left grade 3 invasive ductal carcinoma breast cancer. It measures 2.2 cm and is located in the upper outer quadrant. It is triple negative with a Ki67 of 95%. She has a history of a cervical fusion in 08/2022.  PATIENT GOALS:   reduce lymphedema risk and learn post op HEP.   PAIN:  Are you having pain? No  PRECAUTIONS: Active CA   HAND DOMINANCE: right  WEIGHT BEARING RESTRICTIONS: No  FALLS:  Has patient fallen in last 6 months? 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  PRIOR  LEVEL OF FUNCTION: Independent   OBJECTIVE:  COGNITION: Overall cognitive status: Within functional limits for tasks assessed    POSTURE:  Forward head and rounded shoulders posture  UPPER EXTREMITY AROM/PROM:  A/PROM RIGHT   eval   Shoulder extension 42  Shoulder flexion 160  Shoulder abduction 159  Shoulder internal rotation 58  Shoulder external rotation 78    (Blank rows = not tested)  A/PROM LEFT   eval  Shoulder extension 50  Shoulder flexion 136  Shoulder abduction 166  Shoulder internal rotation 61  Shoulder external rotation 78    (Blank rows = not tested)  CERVICAL AROM: All within normal limits:    Percent limited  Flexion WNL  Extension WNL  Right lateral flexion 50% limited  Left lateral flexion 50% limited  Right rotation 50% limited  Left rotation 50% limited    UPPER EXTREMITY STRENGTH: WNL  LYMPHEDEMA ASSESSMENTS:   LANDMARK RIGHT   eval  10 cm proximal to olecranon process 30.8  Olecranon process 26.7  10 cm proximal to ulnar styloid process 21.9  Just proximal to ulnar styloid process 17  Across hand at thumb web space 18.7  At base of 2nd digit 6.5  (Blank rows = not tested)  LANDMARK LEFT   eval  10 cm proximal to olecranon process 31.7  Olecranon process 27.7  10 cm proximal to ulnar styloid process 21.8  Just proximal to ulnar styloid process 17.3  Across hand at thumb web space 19.5  At base of 2nd digit 6.3  (Blank rows = not tested)  L-DEX LYMPHEDEMA SCREENING:  The patient was assessed using the L-Dex machine today to produce a lymphedema index baseline score. The patient will be reassessed on a regular basis (typically every 3 months) to obtain new L-Dex scores. If the score is > 6.5 points away from his/her baseline score indicating onset of subclinical lymphedema, it will be recommended to wear a compression garment for 4 weeks, 12 hours per day and then be reassessed. If the score continues to be > 6.5 points from  baseline at reassessment, we will initiate lymphedema treatment. Assessing in this manner has a 95% rate of preventing clinically significant lymphedema.   L-DEX FLOWSHEETS - 07/02/23 1100       L-DEX LYMPHEDEMA SCREENING   Measurement Type Unilateral    L-DEX MEASUREMENT EXTREMITY Upper Extremity    POSITION  Standing    DOMINANT SIDE Right    At Risk Side Left    BASELINE SCORE (UNILATERAL) 4.5             QUICK DASH SURVEY:  Quick  Dash - 07/02/23 0001     Open a tight or new jar Mild difficulty    Do heavy household chores (wash walls, wash floors) Mild difficulty    Carry a shopping bag or briefcase No difficulty    Wash your back No difficulty    Use a knife to cut food No difficulty    Recreational activities in which you take some force or impact through your arm, shoulder, or hand (golf, hammering, tennis) No difficulty    During the past week, to what extent has your arm, shoulder or hand problem interfered with your normal social activities with family, friends, neighbors, or groups? Not at all    During the past week, to what extent has your arm, shoulder or hand problem limited your work or other regular daily activities Not at all    Arm, shoulder, or hand pain. None    Tingling (pins and needles) in your arm, shoulder, or hand None    Difficulty Sleeping No difficulty    DASH Score 4.55 %              PATIENT EDUCATION:  Education details: Lymphedema risk reduction and post op shoulder/posture HEP Person educated: Patient Education method: Explanation, Demonstration, Handout Education comprehension: Patient verbalized understanding and returned demonstration  HOME EXERCISE PROGRAM: Patient was instructed today in a home exercise program today for post op shoulder range of motion. These included active assist shoulder flexion in sitting, scapular retraction, wall walking with shoulder abduction, and hands behind head external rotation.  She was encouraged  to do these twice a day, holding 3 seconds and repeating 5 times when permitted by her physician.   ASSESSMENT:  CLINICAL IMPRESSION: Patient was diagnosed on 08/15/2023 with left grade 3 invasive ductal carcinoma breast cancer. It measures 2.2 cm and is located in the upper outer quadrant. It is triple negative with a Ki67 of 95%. She has a history of a cervical fusion in 08/2022. Her multidisciplinary medical team met prior to her assessments to determine a recommended treatment plan. She is planning to have possible neoadjuvant chemotherapy although she is undecided about whether she wants to undergo chemo. She will have a left lumpectomy and sentinel node biopsy followed by radiation. She will benefit from a post op PT reassessment to determine needs and from L-Dex screens every 3 months for 2 years to detect subclinical lymphedema.  Pt will benefit from skilled therapeutic intervention to improve on the following deficits: Decreased knowledge of precautions, impaired UE functional use, pain, decreased ROM, postural dysfunction.   PT treatment/interventions: ADL/self-care home management, pt/family education, therapeutic exercise  REHAB POTENTIAL: Excellent  CLINICAL DECISION MAKING: Stable/uncomplicated  EVALUATION COMPLEXITY: Low   GOALS: Goals reviewed with patient? YES  LONG TERM GOALS: (STG=LTG)    Name Target Date Goal status  1 Pt will be able to verbalize understanding of pertinent lymphedema risk reduction practices relevant to her dx specifically related to skin care.  Baseline:  No knowledge 07/02/2023 Achieved at eval  2 Pt will be able to return demo and/or verbalize understanding of the post op HEP related to regaining shoulder ROM. Baseline:  No knowledge 07/02/2023 Achieved at eval  3 Pt will be able to verbalize understanding of the importance of attending the post op After Breast CA Class for further lymphedema risk reduction education and therapeutic exercise.   Baseline:  No knowledge 07/02/2023 Achieved at eval  4 Pt will demo she has regained full shoulder ROM and function post  operatively compared to baselines.  Baseline: See objective measurements taken today. 01/02/2024     PLAN:  PT FREQUENCY/DURATION: EVAL and 1 follow up appointment.   PLAN FOR NEXT SESSION: will reassess 3-4 weeks post op to determine needs.   Patient will follow up at outpatient cancer rehab 3-4 weeks following surgery.  If the patient requires physical therapy at that time, a specific plan will be dictated and sent to the referring physician for approval. The patient was educated today on appropriate basic range of motion exercises to begin post operatively and the importance of attending the After Breast Cancer class following surgery.  Patient was educated today on lymphedema risk reduction practices as it pertains to recommendations that will benefit the patient immediately following surgery.  She verbalized good understanding.    Physical Therapy Information for After Breast Cancer Surgery/Treatment:  Lymphedema is a swelling condition that you may be at risk for in your arm if you have lymph nodes removed from the armpit area.  After a sentinel node biopsy, the risk is approximately 5-9% and is higher after an axillary node dissection.  There is treatment available for this condition and it is not life-threatening.  Contact your physician or physical therapist with concerns. You may begin the 4 shoulder/posture exercises (see additional sheet) when permitted by your physician (typically a week after surgery).  If you have drains, you may need to wait until those are removed before beginning range of motion exercises.  A general recommendation is to not lift your arms above shoulder height until drains are removed.  These exercises should be done to your tolerance and gently.  This is not a "no pain/no gain" type of recovery so listen to your body and stretch into the range of  motion that you can tolerate, stopping if you have pain.  If you are having immediate reconstruction, ask your plastic surgeon about doing exercises as he or she may want you to wait. We encourage you to attend the free one time ABC (After Breast Cancer) class offered by [facility].  You will learn information related to lymphedema risk, prevention and treatment and additional exercises to regain mobility following surgery.  You can call [phone] for more information.  This is offered the 1st and 3rd Monday of each month.  You only attend the class one time. While undergoing any medical procedure or treatment, try to avoid blood pressure being taken or needle sticks from occurring on the arm on the side of cancer.   This recommendation begins after surgery and continues for the rest of your life.  This may help reduce your risk of getting lymphedema (swelling in your arm). An excellent resource for those seeking information on lymphedema is the National Lymphedema Network's web site. It can be accessed at www.lymphnet.org If you notice swelling in your hand, arm or breast at any time following surgery (even if it is many years from now), please contact your doctor or physical therapist to discuss this.  Lymphedema can be treated at any time but it is easier for you if it is treated early on.  If you feel like your shoulder motion is not returning to normal in a reasonable amount of time, please contact your surgeon or physical therapist.  [facility] [phone]. [address], [address], [place] [address]  ABC CLASS After Breast Cancer Class  After Breast Cancer Class is a specially designed exercise class to assist you  in a safe recover after having breast cancer surgery.  In this class you will learn how to get back to full function whether your drains were just removed or if you had surgery a month ago.  This one-time class is held  the 1st and 3rd Monday of every month from 11:00 a.m. until 12:00 noon virtually.  This class is FREE and space is limited. For more information or to register for the next available class, call [phone].  Class Goals  Understand specific stretches to improve the flexibility of you chest and shoulder. Learn ways to safely strengthen your upper body and improve your posture. Understand the warning signs of infection and why you may be at risk for an arm infection. Learn about Lymphedema and prevention.  ** You do not attend this class until after surgery.  Drains must be removed to participate  Patient was instructed today in a home exercise program today for post op shoulder range of motion. These included active assist shoulder flexion in sitting, scapular retraction, wall walking with shoulder abduction, and hands behind head external rotation.  She was encouraged to do these twice a day, holding 3 seconds and repeating 5 times when permitted by her physician.  Bethann Punches, [place] 07/02/23 12:06 PM

## 2023-07-02 NOTE — Progress Notes (Signed)
[facility] Clinical Social Work  Initial Assessment   [name] [name] is a 72 y.o. year old female accompanied by spouse, Sherrine Maples. Clinical Social Work was referred by  [facility]  for assessment of psychosocial needs.   SDOH (Social Determinants of Health) assessments performed: Yes SDOH Interventions    Flowsheet Row Clinical Support from 07/02/2023 in [facility]  SDOH Interventions   Food Insecurity Interventions Intervention Not Indicated  Housing Interventions Intervention Not Indicated  Transportation Interventions Intervention Not Indicated  Utilities Interventions Intervention Not Indicated       SDOH Screenings   Food Insecurity: No Food Insecurity (07/02/2023)  Housing: Low Risk  (07/02/2023)  Transportation Needs: No Transportation Needs (07/02/2023)  Utilities: Not At Risk (07/02/2023)  Depression (PHQ2-9): Low Risk  (07/02/2023)  Tobacco Use: Medium Risk (07/02/2023)    Family/Social Information:  Housing Arrangement: patient lives with spouse Family members/support persons in your life? 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 yes, is deciding on if she would like to pursue chemotherapy Concerns about diagnosis and/or treatment: Overwhelmed by information Patient reported stressors: Adjusting to my illness Patient enjoys  puzzles Current coping skills/ strengths: Capable of independent living , Communication skills , and Supportive family/friends     SUMMARY: Current SDOH Barriers:  No major barriers noted today  Clinical Social Work Clinical Goal(s):  No clinical social work goals at this  time  Interventions: Discussed common feeling and emotions when being diagnosed with cancer, and the importance of support during treatment Informed patient of the support team roles and support services at [facility] Provided CSW contact information and encouraged patient to call with any questions or concerns   Follow Up Plan: Patient will contact CSW with any support or resource needs Patient verbalizes understanding of plan: Yes    Sheria Rosello E Moselle Rister, LCSW Clinical Social Worker [facility]

## 2023-07-02 NOTE — Research (Signed)
Trial:  [employer] 2021-05 - Specimen Collection Study to Evaluate Biomarkers in Subjects with Cancer   Patient [name] [name] was identified by Dr. Al Pimple as a potential candidate for the above listed study.  This Clinical Research Nurse met with [name] [name], [id number], on 07/02/23 in a manner and location that ensures patient privacy to discuss participation in the above listed research study.  Patient is Accompanied by her husband .   Approximately 5 minutes were spent with the patient reviewing the study.  Patient was provided the option of taking informed consent documents home to review and was encouraged to review at their convenience with their support network, including other care providers. Patient is comfortable with making a decision regarding study participation today.  Patient declined participation and was not interested in taking the consent form home.  Thanked patient for her time. Domenica Reamer, BSN, RN, [employer] Research Nurse II [phone] 07/02/2023

## 2023-07-02 NOTE — Progress Notes (Signed)
REFERRING PROVIDER: Rachel Moulds, MD  PRIMARY PROVIDER:  Merri Brunette, MD  PRIMARY REASON FOR VISIT:  1. Malignant neoplasm of upper-outer quadrant of left breast in female, estrogen receptor negative (HCC)    HISTORY OF PRESENT ILLNESS:   Ms. Kwiat, a 72 y.o. female, was seen for a [facility] cancer genetics consultation at the request of Dr. Al Pimple due to a personal and family history of cancer.  Ms. Whitmer presents to clinic today to discuss the possibility of a hereditary predisposition to cancer, to discuss genetic testing, and to further clarify her future cancer risks, as well as potential cancer risks for family members.   In June 2024, at the age of 44, Ms. Coventry was diagnosed with invasive ductal carcinoma of the left breast (ER/PR/HER2 negative).  CANCER HISTORY:  Oncology History  Malignant neoplasm of upper-outer quadrant of left breast in female, estrogen receptor negative (HCC)  06/03/2023 Mammogram   Screening mammogram bilaterally showed possible mass in the left breast warranting further evaluation.  Diagnostic mammogram confirmed highly suspicious 2.2 cm upper outer left breast mass, no abnormal appearing left axillary lymph nodes   06/23/2023 Pathology Results   Pathology results from the left breast needle core biopsy showed grade 3 invasive poorly differentiated ductal carcinoma, focal high-grade DCIS, cribriform type, negative for angiolymphatic invasion, negative for micro calcs, prognostic showed ER 0% negative, PR 0% negative, HER2 negative and Ki-67 of 95%.   06/30/2023 Initial Diagnosis   Malignant neoplasm of upper-outer quadrant of left breast in female, estrogen receptor negative (HCC)     Past Medical History:  Diagnosis Date   Adenomatous colon polyp    Allergy    Anxiety    Arthritis    bilateral hands. lower back   Cancer [facility])    skin   Cataract    bilateral   COPD (chronic obstructive pulmonary disease) (HCC)    Depression     Diverticulosis    Endometriosis    GERD (gastroesophageal reflux disease)    Glaucoma    Headache    Migraines   Hiatal hernia    IBS (irritable bowel syndrome)    Inguinal hernia    Pneumothorax 1987   PONV (postoperative nausea and vomiting)     Past Surgical History:  Procedure Laterality Date   ABDOMINAL HYSTERECTOMY  1992   ANTERIOR CERVICAL DECOMP/DISCECTOMY FUSION N/A 09/23/2022   Procedure: Anterior Decompression Fusion,PLATE/SCREWS Cervical five-six; REMOVAL OLD PLATE;  Surgeon: Tressie Stalker, MD;  Location: [facility] OR;  Service: Neurosurgery;  Laterality: N/A;   APPENDECTOMY  1992   BACK SURGERY     cervical   BREAST BIOPSY Left 06/23/2023   [place] LT BREAST BX W LOC DEV 1ST LESION IMG BX SPEC US GUIDE 06/23/2023 [facility] MAMMOGRAPHY   CATARACT EXTRACTION Bilateral    2021, 2022   COLONOSCOPY  2023   PLEURAL SCARIFICATION     ruptured disk     torn tendon     right arm   TUBAL LIGATION      Social History   Socioeconomic History   Marital status: Married    Spouse name: Not on file   Number of children: 2   Years of education: Not on file   Highest education level: Not on file  Occupational History   Occupation: locksmith  Tobacco Use   Smoking status: Former    Packs/day: 1.50    Years: 40.00    Additional pack years: 0.00    Total pack years: 60.00  Types: Cigarettes    Start date: 12/31/1971    Quit date: 02/10/2012    Years since quitting: 11.3   Smokeless tobacco: Never  Vaping Use   Vaping Use: Never used  Substance and Sexual Activity   Alcohol use: No   Drug use: No   Sexual activity: Never  Other Topics Concern   Not on file  Social History Narrative   Not on file   Social Determinants of Health   Financial Resource Strain: Not on file  Food Insecurity: No Food Insecurity (07/02/2023)   Hunger Vital Sign    Worried About Running Out of Food in the Last Year: Never true    Ran Out of Food in the Last Year: Never true  Transportation Needs:  No Transportation Needs (07/02/2023)   PRAPARE - Administrator, Civil Service (Medical): No    Lack of Transportation (Non-Medical): No  Physical Activity: Not on file  Stress: Not on file  Social Connections: Not on file     FAMILY HISTORY:  We obtained a detailed, 4-generation family history.  Significant diagnoses are listed below: Family History  Problem Relation Age of Onset   Heart failure Mother    Crohn's disease Sister    Osteoporosis Sister    Neuropathy Sister    Colon polyps Sister    Lung cancer Sister 53       smoked   Thyroid disease Sister    Thyroid cancer Maternal Grandmother    Colon cancer Neg Hx    Breast cancer Neg Hx    Esophageal cancer Neg Hx    Pancreatic cancer Neg Hx    Stomach cancer Neg Hx      Ms. Motton's sister was diagnosed with lung cancer at age 28, she smoked. Her maternal grandmother was diagnosed with thyroid cancer at an unknown age, she is deceased. Ms. Stephney is unaware of previous family history of genetic testing for hereditary cancer risks. There is no reported Ashkenazi Jewish ancestry.   GENETIC COUNSELING ASSESSMENT: Ms. Desantiago is a 72 y.o. female with a personal history of cancer which is somewhat suggestive of a hereditary predisposition to cancer given her triple negative breast cancer. We, therefore, discussed and recommended the following at today's visit.   DISCUSSION: We discussed that 5 - 10% of cancer is hereditary, with most cases of breast cancer associated with BRCA1/2. There are other genes that can be associated with hereditary breast cancer syndromes. We discussed that testing is beneficial for several reasons including knowing how to follow individuals after completing their treatment, identifying whether potential treatment options would be beneficial, and understanding if other family members could be at risk for cancer and allowing them to undergo genetic testing.   We reviewed the characteristics,  features and inheritance patterns of hereditary cancer syndromes. We also discussed genetic testing, including the appropriate family members to test, the process of testing, insurance coverage and turn-around-time for results. We discussed the implications of a negative, positive, carrier and/or variant of uncertain significant result.   Ms. Kliment elected to have [facility] Custom Panel. The Custom Hereditary Cancers Panel offered by [facility] includes sequencing and/or deletion duplication testing of the following 46 genes: APC, ATM, AXIN2, BAP1, BARD1, BMPR1A, BRCA1, BRCA2, BRIP1, CDH1, CDK4, CDKN2A (p14ARF and p16INK4a only), CHEK2, CTNNA1, DICER1, EPCAM (Deletion/duplication testing only), FH, GREM1 (promoter region duplication testing only), HOXB13, KIT, MBD4, MEN1, MLH1, MSH2, MSH3, MSH6, MUTYH, NF1, NHTL1, PALB2, PDGFRA, PMS2, POLD1, POLE, PTEN, PRKAR1A, RAD51C, RAD51D,  RET, SMAD4, SMARCA4. STK11, TP53, TSC1, TSC2, and VHL.  Based on Ms. Freas's personal history of cancer, she meets medical criteria for genetic testing. Despite that she meets criteria, she may still have an out of pocket cost. We discussed that if her out of pocket cost for testing is over $100, the laboratory will call and confirm whether she wants to proceed with testing.  If the out of pocket cost of testing is less than $100 she will be billed by the genetic testing laboratory.   PLAN: After considering the risks, benefits, and limitations, Ms. Mccurdy provided informed consent to pursue genetic testing and the blood sample was sent to [facility] for analysis of the Custom Panel. Results should be available within approximately 2-3 weeks' time, at which point they will be disclosed by telephone to Ms. Morison, as will any additional recommendations warranted by these results. Ms. Rippeon will receive a summary of her genetic counseling visit and a copy of her results once available. This information will also be available in  Epic.   Ms. Menning questions were answered to her satisfaction today. Our contact information was provided should additional questions or concerns arise. Thank you for the referral and allowing [place] to share in the care of your patient.   [name], MS, [facility] Genetic Counselor [email or web address] (P) [phone]  The patient was seen for a total of 20 minutes in face-to-face genetic counseling. The patient brought her husband. Drs. Pamelia Hoit and/or Mosetta Putt were available to discuss this case as needed.   _______________________________________________________________________ For Office Staff:  Number of people involved in session: 2 Was an Intern/ student involved with case: no

## 2023-07-04 ENCOUNTER — Encounter: Payer: Self-pay | Admitting: Pulmonary Disease

## 2023-07-04 ENCOUNTER — Other Ambulatory Visit: Payer: Self-pay | Admitting: Surgery

## 2023-07-04 DIAGNOSIS — C50912 Malignant neoplasm of unspecified site of left female breast: Secondary | ICD-10-CM

## 2023-07-06 ENCOUNTER — Encounter: Payer: Self-pay | Admitting: Pulmonary Disease

## 2023-07-08 DIAGNOSIS — M810 Age-related osteoporosis without current pathological fracture: Secondary | ICD-10-CM | POA: Diagnosis not present

## 2023-07-09 DIAGNOSIS — K529 Noninfective gastroenteritis and colitis, unspecified: Secondary | ICD-10-CM | POA: Diagnosis not present

## 2023-07-09 DIAGNOSIS — G4709 Other insomnia: Secondary | ICD-10-CM | POA: Diagnosis not present

## 2023-07-09 DIAGNOSIS — C50912 Malignant neoplasm of unspecified site of left female breast: Secondary | ICD-10-CM | POA: Diagnosis not present

## 2023-07-09 DIAGNOSIS — Z171 Estrogen receptor negative status [ER-]: Secondary | ICD-10-CM | POA: Diagnosis not present

## 2023-07-10 ENCOUNTER — Telehealth: Payer: Self-pay | Admitting: *Deleted

## 2023-07-10 ENCOUNTER — Encounter: Payer: Self-pay | Admitting: *Deleted

## 2023-07-10 NOTE — Telephone Encounter (Signed)
Left message for a return phone call to follow up from [facility] 7/3.

## 2023-07-11 ENCOUNTER — Telehealth: Payer: Self-pay | Admitting: Genetic Counselor

## 2023-07-11 ENCOUNTER — Encounter: Payer: Self-pay | Admitting: Genetic Counselor

## 2023-07-11 DIAGNOSIS — M545 Low back pain, unspecified: Secondary | ICD-10-CM | POA: Diagnosis not present

## 2023-07-11 DIAGNOSIS — Z1379 Encounter for other screening for genetic and chromosomal anomalies: Secondary | ICD-10-CM | POA: Insufficient documentation

## 2023-07-11 NOTE — Telephone Encounter (Signed)
I contacted [name] [name] to discuss her genetic testing results. No pathogenic variants were identified in the 46 genes analyzed. Of note, a variant of uncertain significance was identified in the MSH3 gene. Detailed clinic note to follow.  The test report has been scanned into EPIC and is located under the Molecular Pathology section of the Results Review tab.  A portion of the result report is included below for reference.   [name], MS, [facility] Genetic Counselor [email or web address] (P) [phone]

## 2023-07-14 ENCOUNTER — Telehealth: Payer: Self-pay | Admitting: Hematology and Oncology

## 2023-07-14 NOTE — Telephone Encounter (Signed)
Patient is aware of upcoming appointment times/dates.  

## 2023-07-15 DIAGNOSIS — M4722 Other spondylosis with radiculopathy, cervical region: Secondary | ICD-10-CM | POA: Diagnosis not present

## 2023-07-18 ENCOUNTER — Encounter: Payer: Self-pay | Admitting: Genetic Counselor

## 2023-07-18 ENCOUNTER — Ambulatory Visit: Payer: Self-pay | Admitting: Genetic Counselor

## 2023-07-18 DIAGNOSIS — Z1379 Encounter for other screening for genetic and chromosomal anomalies: Secondary | ICD-10-CM

## 2023-07-18 NOTE — Progress Notes (Signed)
HPI:   [name] [name] was previously seen in the [facility] due to a personal and family history of cancer and concerns regarding a hereditary predisposition to cancer. Please refer to our prior cancer genetics clinic note for more information regarding our discussion, assessment and recommendations, at the time. Ms. Chapa recent genetic test results were disclosed to her, as were recommendations warranted by these results. These results and recommendations are discussed in more detail below.  CANCER HISTORY:  Oncology History  Malignant neoplasm of upper-outer quadrant of left breast in female, estrogen receptor negative (HCC)  06/03/2023 Mammogram   Screening mammogram bilaterally showed possible mass in the left breast warranting further evaluation.  Diagnostic mammogram confirmed highly suspicious 2.2 cm upper outer left breast mass, no abnormal appearing left axillary lymph nodes   06/23/2023 Pathology Results   Pathology results from the left breast needle core biopsy showed grade 3 invasive poorly differentiated ductal carcinoma, focal high-grade DCIS, cribriform type, negative for angiolymphatic invasion, negative for micro calcs, prognostic showed ER 0% negative, PR 0% negative, HER2 negative and Ki-67 of 95%.   06/30/2023 Initial Diagnosis   Malignant neoplasm of upper-outer quadrant of left breast in female, estrogen receptor negative (HCC)    Genetic Testing   [facility] Custom Panel+RNA was Negative. Of note, a variant of uncertain significance was detected in the MSH3 gene (c.1027+4T>C (Intronic)). Report date is 07/10/2023.  The Custom Hereditary Cancers Panel offered by [facility] includes sequencing and/or deletion duplication testing of the following 46 genes: APC, ATM, AXIN2, BAP1, BARD1, BMPR1A, BRCA1, BRCA2, BRIP1, CDH1, CDK4, CDKN2A (p14ARF and p16INK4a only), CHEK2, CTNNA1, DICER1, EPCAM (Deletion/duplication testing only), FH, GREM1 (promoter region duplication  testing only), HOXB13, KIT, MBD4, MEN1, MLH1, MSH2, MSH3, MSH6, MUTYH, NF1, NHTL1, PALB2, PDGFRA, PMS2, POLD1, POLE, PRKAR1A, PTEN, RAD51C, RAD51D, RET, SMAD4, SMARCA4. STK11, TP53, TSC1, TSC2, and VHL.      FAMILY HISTORY:  We obtained a detailed, 4-generation family history.  Significant diagnoses are listed below:      Family History  Problem Relation Age of Onset   Heart failure Mother     Crohn's disease Sister     Osteoporosis Sister     Neuropathy Sister     Colon polyps Sister     Lung cancer Sister 75        smoked   Thyroid disease Sister     Thyroid cancer Maternal Grandmother     Colon cancer Neg Hx     Breast cancer Neg Hx     Esophageal cancer Neg Hx     Pancreatic cancer Neg Hx     Stomach cancer Neg Hx             Ms. Spurr sister was diagnosed with lung cancer at age 44, she smoked. Her maternal grandmother was diagnosed with thyroid cancer at an unknown age, she is deceased. Ms. Edelen is unaware of previous family history of genetic testing for hereditary cancer risks. There is no reported Ashkenazi Jewish ancestry.    GENETIC TEST RESULTS:  The [facility] Custom Panel found no pathogenic mutations.   The Custom Hereditary Cancers Panel offered by [facility] includes sequencing and/or deletion duplication testing of the following 46 genes: APC, ATM, AXIN2, BAP1, BARD1, BMPR1A, BRCA1, BRCA2, BRIP1, CDH1, CDK4, CDKN2A (p14ARF and p16INK4a only), CHEK2, CTNNA1, DICER1, EPCAM (Deletion/duplication testing only), FH, GREM1 (promoter region duplication testing only), HOXB13, KIT, MBD4, MEN1, MLH1, MSH2, MSH3, MSH6, MUTYH, NF1, NHTL1, PALB2, PDGFRA, PMS2, POLD1, POLE,  PTEN, PRKAR1A, RAD51C, RAD51D, RET, SMAD4, SMARCA4. STK11, TP53, TSC1, TSC2, and VHL.   The test report has been scanned into EPIC and is located under the Molecular Pathology section of the Results Review tab.  A portion of the result report is included below for reference. Genetic testing reported out on  07/10/2023.       Genetic testing identified a variant of uncertain significance (VUS) in the MSH3 gene called c.1027+4T>C (Intronic).  At this time, it is unknown if this variant is associated with an increased risk for cancer or if it is benign, but most uncertain variants are reclassified to benign. It should not be used to make medical management decisions. With time, we suspect the laboratory will determine the significance of this variant, if any. If the laboratory reclassifies this variant, we will attempt to contact Ms. Menzie to discuss it further.   Even though a pathogenic variant was not identified, possible explanations for the cancer in the family may include: There may be no hereditary risk for cancer in the family. The cancers in Ms. Eggenberger and/or her family may be due to other genetic or environmental factors. There may be a gene mutation in one of these genes that current testing methods cannot detect, but that chance is small. There could be another gene that has not yet been discovered, or that we have not yet tested, that is responsible for the cancer diagnoses in the family.   Therefore, it is important to remain in touch with cancer genetics in the future so that we can continue to offer Ms. Nealis the most up to date genetic testing.   ADDITIONAL GENETIC TESTING:  We discussed with Ms. Maestas that her genetic testing was fairly extensive.  If there are genes identified to increase cancer risk that can be analyzed in the future, we would be happy to discuss and coordinate this testing at that time.    CANCER SCREENING RECOMMENDATIONS:  Ms. Barasch test result is considered negative (normal).  This means that we have not identified a hereditary cause for her personal and family history of cancer at this time. Most cancers happen by chance and this negative test suggests that her cancer may fall into this category.    An individual's cancer risk and medical management are  not determined by genetic test results alone. Overall cancer risk assessment incorporates additional factors, including personal medical history, family history, and any available genetic information that may result in a personalized plan for cancer prevention and surveillance. Therefore, it is recommended she continue to follow the cancer management and screening guidelines provided by her oncology and primary healthcare provider.  RECOMMENDATIONS FOR FAMILY MEMBERS:   Since she did not inherit a mutation in a cancer predisposition gene included on this panel, her children could not have inherited a mutation from her in one of these genes. Individuals in this family might be at some increased risk of developing cancer, over the general population risk, due to the family history of cancer. We recommend women in this family have a yearly mammogram beginning at age 32, or 105 years younger than the earliest onset of cancer, an annual clinical breast exam, and perform monthly breast self-exams. We do not recommend familial testing for the MSH3 variant of uncertain significance (VUS).  FOLLOW-UP:  Cancer genetics is a rapidly advancing field and it is possible that new genetic tests will be appropriate for her and/or her family members in the future. We encouraged her to remain  in contact with cancer genetics on an annual basis so we can update her personal and family histories and let her know of advances in cancer genetics that may benefit this family.   Our contact number was provided. Ms. Shannahan questions were answered to her satisfaction, and she knows she is welcome to call us at anytime with additional questions or concerns.   [name], MS, [facility] Genetic Counselor [email or web address] (P) [phone]

## 2023-07-22 ENCOUNTER — Telehealth: Payer: Self-pay | Admitting: Gastroenterology

## 2023-07-22 NOTE — Telephone Encounter (Signed)
Spoke with the patient. She complains of ongoing diarrhea since June. She tells me she was referred in June by her PCP for this. She tested positive for the "norovirus and I was told to take Imodium. I have never been on a cruise ship in my life." Patient describes stool as having mucous strands and "white stuff" in it. She will have breast surgery next week with lymph node biopsy. Patient says she starts treatment with Oncology after that. She declines an appointment next week. No openings this week. Patient was last seen here 05/09/2022

## 2023-07-22 NOTE — Pre-Procedure Instructions (Signed)
Surgical Instructions   Your procedure is scheduled on July 29, 2023. Report to [facility] Main Entrance "A" at 5:30 A.M., then check in with the Admitting office. Any questions or running late day of surgery: call [phone]  Questions prior to your surgery date: call [phone], Monday-Friday, 8am-4pm. If you experience any cold or flu symptoms such as cough, fever, chills, shortness of breath, etc. between now and your scheduled surgery, please notify us at the above number.     Remember:  Do not eat after midnight the night before your surgery   You may drink clear liquids until 4:30 AM the morning of your surgery.   Clear liquids allowed are: Water, Non-Citrus Juices (without pulp), Carbonated Beverages, Clear Tea, Black Coffee Only (NO MILK, CREAM OR POWDERED CREAMER of any kind), and Gatorade.    Take these medicines the morning of surgery with A SIP OF WATER: fluticasone (FLONASE) nasal spray  sertraline (ZOLOFT)    May take these medicines IF NEEDED: albuterol (VENTOLIN HFA) inhaler  ALPRAZolam (XANAX)  famotidine (PEPCID)  umeclidinium-vilanterol (ANORO ELLIPTA)  promethazine (PHENERGAN) suppository    Follow your surgeon's instructions on when to stop Aspirin.  If no instructions were given by your surgeon then you will need to call the office to get those instructions.     One week prior to surgery, STOP taking any Aleve, Naproxen, Ibuprofen, Motrin, Advil, Goody's, BC's, all herbal medications, fish oil, and non-prescription vitamins.                     Do NOT Smoke (Tobacco/Vaping) for 24 hours prior to your procedure.  If you use a CPAP at night, you may bring your mask/headgear for your overnight stay.   You will be asked to remove any contacts, glasses, piercing's, hearing aid's, dentures/partials prior to surgery. Please bring cases for these items if needed.    Patients discharged the day of surgery will not be allowed to drive home, and someone  needs to stay with them for 24 hours.  SURGICAL WAITING ROOM VISITATION Patients may have no more than 2 support people in the waiting area - these visitors may rotate.   Pre-op nurse will coordinate an appropriate time for 1 ADULT support person, who may not rotate, to accompany patient in pre-op.  Children under the age of 68 must have an adult with them who is not the patient and must remain in the main waiting area with an adult.  If the patient needs to stay at the hospital during part of their recovery, the visitor guidelines for inpatient rooms apply.  Please refer to the [facility] website for the visitor guidelines for any additional information.   If you received a COVID test during your pre-op visit  it is requested that you wear a mask when out in public, stay away from anyone that may not be feeling well and notify your surgeon if you develop symptoms. If you have been in contact with anyone that has tested positive in the last 10 days please notify you surgeon.      Pre-operative CHG Bathing Instructions   You can play a key role in reducing the risk of infection after surgery. Your skin needs to be as free of germs as possible. You can reduce the number of germs on your skin by washing with CHG (chlorhexidine gluconate) soap before surgery. CHG is an antiseptic soap that kills germs and continues to kill germs even after washing.  DO NOT use if you have an allergy to chlorhexidine/CHG or antibacterial soaps. If your skin becomes reddened or irritated, stop using the CHG and notify one of our RNs at [phone].              TAKE A SHOWER THE NIGHT BEFORE SURGERY AND THE DAY OF SURGERY    Please keep in mind the following:  DO NOT shave, including legs and underarms, 48 hours prior to surgery.   You may shave your face before/day of surgery.  Place clean sheets on your bed the night before surgery Use a clean washcloth (not used since being washed) for each shower. DO  NOT sleep with pet's night before surgery.  CHG Shower Instructions:  If you choose to wash your hair and private area, wash first with your normal shampoo/soap.  After you use shampoo/soap, rinse your hair and body thoroughly to remove shampoo/soap residue.  Turn the water OFF and apply half the bottle of CHG soap to a CLEAN washcloth.  Apply CHG soap ONLY FROM YOUR NECK DOWN TO YOUR TOES (washing for 3-5 minutes)  DO NOT use CHG soap on face, private areas, open wounds, or sores.  Pay special attention to the area where your surgery is being performed.  If you are having back surgery, having someone wash your back for you may be helpful. Wait 2 minutes after CHG soap is applied, then you may rinse off the CHG soap.  Pat dry with a clean towel  Put on clean pajamas    Additional instructions for the day of surgery: DO NOT APPLY any lotions, deodorants, cologne, or perfumes.   Do not wear jewelry or makeup Do not wear nail polish, gel polish, artificial nails, or any other type of covering on natural nails (fingers and toes) Do not bring valuables to the hospital. [facility] is not responsible for valuables/personal belongings. Put on clean/comfortable clothes.  Please brush your teeth.  Ask your nurse before applying any prescription medications to the skin.

## 2023-07-22 NOTE — Telephone Encounter (Signed)
Patient called states she is having issues with her stomach and is seeking further advise.

## 2023-07-23 ENCOUNTER — Encounter (HOSPITAL_COMMUNITY)
Admission: RE | Admit: 2023-07-23 | Discharge: 2023-07-23 | Disposition: A | Discharge status: Home / Self Care | Payer: Medicare Other | Source: Ambulatory Visit | Attending: Surgery | Admitting: Surgery

## 2023-07-23 ENCOUNTER — Encounter (HOSPITAL_COMMUNITY): Payer: Self-pay

## 2023-07-23 ENCOUNTER — Other Ambulatory Visit: Payer: Self-pay

## 2023-07-23 ENCOUNTER — Encounter: Payer: Self-pay | Admitting: Pulmonary Disease

## 2023-07-23 VITALS — BP 137/72 | HR 60 | Temp 98.5°F | Resp 18 | Ht 69.0 in | Wt 220.7 lb

## 2023-07-23 DIAGNOSIS — Z01818 Encounter for other preprocedural examination: Secondary | ICD-10-CM | POA: Diagnosis not present

## 2023-07-23 DIAGNOSIS — R911 Solitary pulmonary nodule: Secondary | ICD-10-CM | POA: Diagnosis not present

## 2023-07-23 DIAGNOSIS — K449 Diaphragmatic hernia without obstruction or gangrene: Secondary | ICD-10-CM | POA: Insufficient documentation

## 2023-07-23 DIAGNOSIS — Z87891 Personal history of nicotine dependence: Secondary | ICD-10-CM | POA: Insufficient documentation

## 2023-07-23 DIAGNOSIS — Z85828 Personal history of other malignant neoplasm of skin: Secondary | ICD-10-CM | POA: Diagnosis not present

## 2023-07-23 DIAGNOSIS — K219 Gastro-esophageal reflux disease without esophagitis: Secondary | ICD-10-CM | POA: Insufficient documentation

## 2023-07-23 DIAGNOSIS — J449 Chronic obstructive pulmonary disease, unspecified: Secondary | ICD-10-CM | POA: Insufficient documentation

## 2023-07-23 DIAGNOSIS — C50912 Malignant neoplasm of unspecified site of left female breast: Secondary | ICD-10-CM | POA: Diagnosis not present

## 2023-07-23 DIAGNOSIS — E669 Obesity, unspecified: Secondary | ICD-10-CM | POA: Diagnosis not present

## 2023-07-23 DIAGNOSIS — I2510 Atherosclerotic heart disease of native coronary artery without angina pectoris: Secondary | ICD-10-CM | POA: Diagnosis not present

## 2023-07-23 DIAGNOSIS — H409 Unspecified glaucoma: Secondary | ICD-10-CM | POA: Diagnosis not present

## 2023-07-23 DIAGNOSIS — Z79899 Other long term (current) drug therapy: Secondary | ICD-10-CM | POA: Insufficient documentation

## 2023-07-23 DIAGNOSIS — K582 Mixed irritable bowel syndrome: Secondary | ICD-10-CM | POA: Diagnosis not present

## 2023-07-23 DIAGNOSIS — Z90710 Acquired absence of both cervix and uterus: Secondary | ICD-10-CM | POA: Insufficient documentation

## 2023-07-23 LAB — COMPREHENSIVE METABOLIC PANEL
ALT: 16 U/L (ref 0–44)
AST: 15 U/L (ref 15–41)
Albumin: 3.7 g/dL (ref 3.5–5.0)
Alkaline Phosphatase: 79 U/L (ref 38–126)
Anion gap: 8 (ref 5–15)
BUN: 16 mg/dL (ref 8–23)
CO2: 27 mmol/L (ref 22–32)
Calcium: 9 mg/dL (ref 8.9–10.3)
Chloride: 104 mmol/L (ref 98–111)
Creatinine, Ser: 1.12 mg/dL — ABNORMAL HIGH (ref 0.44–1.00)
GFR, Estimated: 53 mL/min — ABNORMAL LOW (ref >60)
Glucose, Bld: 96 mg/dL (ref 70–99)
Potassium: 4.5 mmol/L (ref 3.5–5.1)
Sodium: 139 mmol/L (ref 135–145)
Total Bilirubin: 0.5 mg/dL (ref 0.3–1.2)
Total Protein: 7.4 g/dL (ref 6.5–8.1)

## 2023-07-23 LAB — CBC
HCT: 46.5 % — ABNORMAL HIGH (ref 36.0–46.0)
Hemoglobin: 14.9 g/dL (ref 12.0–15.0)
MCH: 30.9 pg (ref 26.0–34.0)
MCHC: 32 g/dL (ref 30.0–36.0)
MCV: 96.5 fL (ref 80.0–100.0)
Platelets: 269 10*3/uL (ref 150–400)
RBC: 4.82 MIL/uL (ref 3.87–5.11)
RDW: 14.7 % (ref 11.5–15.5)
WBC: 8.6 10*3/uL (ref 4.0–10.5)
nRBC: 0 % (ref 0.0–0.2)

## 2023-07-23 NOTE — Progress Notes (Addendum)
PCP - Dory Peru  Cardiologist - denies  PPM/ICD - denies Device Orders -  Rep Notified -   Chest x-ray - none EKG - na Stress Test -05/21/12 ECHO - 05/21/12 Cardiac Cath - none  Sleep Study - none CPAP -   Fasting Blood Sugar - na Checks Blood Sugar _____ times a day  Last dose of GLP1 agonist-  na GLP1 instructions:   Blood Thinner Instructions:na Aspirin Instructions:Pt agrees to call Dr. Rosezena Sensor office for instructions.   ERAS Protcol -clears until 0430 PRE-SURGERY Ensure or G2-   COVID TEST- na   Anesthesia review: yes- seed patient. Notified Charlette Caffey that pt is currently having LLQ abdominal pain similar to when she had diverticulitis in the past and that patient had diarrhea since June and was diagnosed with Norovirus in June. Pt did report that diarrhea has subsided in last two weeks and she has not taken Immodium in one week. CBC and CMP repeated. Will notify Dr. Rosezena Sensor office of pt's symptom's.   Patient denies shortness of breath, fever, cough and chest pain at PAT appointment   All instructions explained to the patient, with a verbal understanding of the material. Patient agrees to go over the instructions while at home for a better understanding. Patient also instructed to self quarantine after being tested for COVID-19. The opportunity to ask questions was provided.

## 2023-07-23 NOTE — Telephone Encounter (Signed)
After acute episode of gastroenteritis with norovirus, bowel habits can take a while to get back to normal.  Please advise patient to add Benefiber 1 tablespoon 3 times daily with meals and okay to use Imodium as needed.

## 2023-07-23 NOTE — Progress Notes (Signed)
Called [facility] Triage nurse to to see if Dr. Luisa Hart is aware of pt's issues with diarrhea and Norovirus in June and to see if this would interfere with patient's surgery. No answer and no voicemail available. Will try again later.

## 2023-07-23 NOTE — Telephone Encounter (Signed)
Recommendations given to the patient. 

## 2023-07-24 NOTE — Progress Notes (Signed)
Anesthesia Chart Review:  Case: [id number] Date/Time: 07/29/23 0715   Procedure: LEFT BREAST SEED LUMPECTOMY WITH LEFT SENTINEL LYMPH NODE MAPPING (Left) - PEC BLOCK   Anesthesia type: General   Pre-op diagnosis: LEFT BREAST CANCER   [facility] / [facility]   Surgeons: Harriette Bouillon, MD       DISCUSSION: Patient is a 72 year old female scheduled for the above procedure.  History includes former smoker (quit 02/10/12), post-operative N/V, COPD (moderate, FEV1 65% of predicted), left breast cancer (06/23/23 IDC, DCIS), GERD, hiatal hernia, IBS (alternating constipation/diarrhea), pneumothorax (1987; history pleural "scarification"), glaucoma, endometriosis (hysterectomy 1992), skin cancer, spinal surgery (back surgery; C5-6 ACDF 09/23/22), lung nodule (stable 6 mm RML since 2020, no f/u needed per pulmonologist 03/03/23). BMI is consistent with obesity.    Patient has known IBS. She also reported Norovirus in June 2024. She had been having persistent diarrhea, but at PAT reported not in the past 2 weeks She had not used Imodium in the past week. She did report some LLQ pain that felt similar to when she developed diverticulitis. Denied fever. Labs updated given this history and showed stable Creatinine 1.12, normal K 4.5, normal LFTs and WBC (8.6). If she thinks she may be developing early diverticulitis or worsening abdominal pain or fever then advised to notify PCP, GI and/or Dr. Luisa Hart for additional recommendations. She did place a call to her GI Dr. Lavon Paganini about surgery plans and bowel habits not yet back to normal after Norovirus in June and was advised to add Benefiber 1 TBS TID with meals and can use Imodium as needed.   She is for RSL on 07/28/23 at 2:30 PM. Anesthesia team to evaluate on the day of surgery.    VS: BP 137/72   Pulse 60   Temp 36.9 C (Oral)   Resp 18   Ht 5\' 9"  (1.753 m)   Wt 100.1 kg   SpO2 95%   BMI 32.59 kg/m    PROVIDERS: Merri Brunette, MD is PCP   Durel Salts, MD is pulmonologist Rachel Moulds, MD is HEM-ONC Antony Blackbird, MD is RAD-ONC Marsa Aris, MD is GI   LABS: Labs reviewed: Acceptable for surgery. (all labs ordered are listed, but only abnormal results are displayed)  Labs Reviewed  COMPREHENSIVE METABOLIC PANEL - Abnormal; Notable for the following components:      Result Value   Creatinine, Ser 1.12 (*)    GFR, Estimated 53 (*)    All other components within normal limits  CBC - Abnormal; Notable for the following components:   HCT 46.5 (*)    All other components within normal limits    PFTs 04/29/22: FVC 2.74 (72%), post 2.68 (71%). FEV1 1.89 (65%), post 2.00 (69%). DLCO unc/cor 18.34 (77%).    EKG: N/A   CV: CT Cardiac Calcium Scoring 05/04/20 ([facility]): FINDINGS:  Calcium score: 65.  LM: 20.  LAD: 45.  Cx: 0.  RCA: 0   Heart size is Normal  Visible lung fields are: clear  Lymph nodes: Not enlarged  Abdomen: Hiatal hernia.12   IMPRESSION: Mild calcified coronary artery plaque.   Past Medical History:  Diagnosis Date   Adenomatous colon polyp    Allergy    Anxiety    Arthritis    bilateral hands. lower back   Cancer [facility])    skin   Cataract    bilateral   COPD (chronic obstructive pulmonary disease) (HCC)    Depression  Diverticulosis    Endometriosis    GERD (gastroesophageal reflux disease)    Glaucoma    Headache    Migraines   Hiatal hernia    IBS (irritable bowel syndrome)    Inguinal hernia    Pneumothorax 1987   PONV (postoperative nausea and vomiting)     Past Surgical History:  Procedure Laterality Date   ABDOMINAL HYSTERECTOMY  1992   ANTERIOR CERVICAL DECOMP/DISCECTOMY FUSION N/A 09/23/2022   Procedure: Anterior Decompression Fusion,PLATE/SCREWS Cervical five-six; REMOVAL OLD PLATE;  Surgeon: Tressie Stalker, MD;  Location: [facility] OR;  Service: Neurosurgery;  Laterality: N/A;   APPENDECTOMY  1992   BACK SURGERY     cervical   BREAST BIOPSY Left  06/23/2023   [place] LT BREAST BX W LOC DEV 1ST LESION IMG BX SPEC US GUIDE 06/23/2023 [facility] MAMMOGRAPHY   CATARACT EXTRACTION Bilateral    2021, 2022   COLONOSCOPY  2023   PLEURAL SCARIFICATION     ruptured disk     torn tendon     right arm   TUBAL LIGATION      MEDICATIONS:  albuterol (VENTOLIN HFA) 108 (90 Base) MCG/ACT inhaler   ALPRAZolam (XANAX) 0.25 MG tablet   aspirin EC 81 MG tablet   Bacillus Coagulans-Inulin (PROBIOTIC) 1-250 BILLION-MG CAPS   Cholecalciferol (VITAMIN D PO)   denosumab (PROLIA) 60 MG/ML SOSY injection   famotidine (PEPCID) 20 MG tablet   fluticasone (FLONASE) 50 MCG/ACT nasal spray   hydrocortisone cream 1 %   inclisiran (LEQVIO) 284 MG/1.5ML SOSY injection   MELATONIN-CHAMOMILE PO   promethazine (PHENERGAN) 12.5 MG suppository   sertraline (ZOLOFT) 100 MG tablet   traZODone (DESYREL) 50 MG tablet   umeclidinium-vilanterol (ANORO ELLIPTA) 62.5-25 MCG/ACT AEPB   No current facility-administered medications for this encounter.  Advised to contact surgeon's office for perioperative ASA instructions.   Shonna Chock, PA-C Surgical Short Stay/Anesthesiology [facility] Phone [phone] [facility] Phone [phone] 07/24/2023 10:45 AM

## 2023-07-24 NOTE — Anesthesia Preprocedure Evaluation (Addendum)
Anesthesia Evaluation  Patient identified by MRN, date of birth, ID band Patient awake    Reviewed: Allergy & Precautions, NPO status , Patient's Chart, lab work & pertinent test results  History of Anesthesia Complications (+) PONV and history of anesthetic complications  Airway Mallampati: II  TM Distance: >3 FB Neck ROM: Full   Comment: Previous grade I view with glidescope 3, easy mask Dental  (+) Dental Advisory Given   Pulmonary neg sleep apnea, COPD, neg recent URI, former smoker H/o pneumothorax in 1987   Pulmonary exam normal breath sounds clear to auscultation       Cardiovascular (-) hypertension(-) angina + CAD  (-) Past MI, (-) Cardiac Stents and (-) CABG (-) dysrhythmias  Rhythm:Regular Rate:Normal     Neuro/Psych  Headaches, neg Seizures PSYCHIATRIC DISORDERS Anxiety Depression     Neuromuscular disease (cervical spondylosis with radiculopathy)    GI/Hepatic Neg liver ROS, hiatal hernia,GERD  Medicated,,diverticulosis   Endo/Other  negative endocrine ROS    Renal/GU negative Renal ROS     Musculoskeletal  (+) Arthritis ,    Abdominal   Peds  Hematology negative hematology ROS (+)   Anesthesia Other Findings Norovirus in June  Reproductive/Obstetrics Left breast cancer                             Anesthesia Physical Anesthesia Plan  ASA: 3  Anesthesia Plan: General   Post-op Pain Management: Regional block* and Tylenol PO (pre-op)*   Induction: Intravenous  PONV Risk Score and Plan: 4 or greater and Ondansetron, Dexamethasone, Propofol infusion, TIVA and Treatment may vary due to age or medical condition  Airway Management Planned: LMA  Additional Equipment:   Intra-op Plan:   Post-operative Plan: Extubation in OR  Informed Consent: I have reviewed the patients History and Physical, chart, labs and discussed the procedure including the risks, benefits and  alternatives for the proposed anesthesia with the patient or authorized representative who has indicated his/her understanding and acceptance.     Dental advisory given  Plan Discussed with: CRNA and Anesthesiologist  Anesthesia Plan Comments: (PAT note written 07/24/2023 by Shonna Chock, PA-C.  Discussed potential risks of nerve blocks including, but not limited to, infection, bleeding, nerve damage, seizures, pneumothorax, respiratory depression, and potential failure of the block. Alternatives to nerve blocks discussed. All questions answered.  Risks of general anesthesia discussed including, but not limited to, sore throat, hoarse voice, chipped/damaged teeth, injury to vocal cords, nausea and vomiting, allergic reactions, lung infection, heart attack, stroke, and death. All questions answered.   )       Anesthesia Quick Evaluation

## 2023-07-24 NOTE — Progress Notes (Signed)
Spoke with Steward Drone from Dr. Rosezena Sensor office. Informed her that patient had diarrhea since June and was diagnosed with Norovirus in June. Pt reports diarrhea stopped approximately two weeks ago. She was taking Immodium. She stoppped that about one week ago. Yesterday she reported having left lower quadrant abdominal pain similar to pain she had with diverticulitis in the past. CBC drawn yesterday WNL. Steward Drone will relay information to Dr. Luisa Hart.

## 2023-07-28 ENCOUNTER — Ambulatory Visit
Admission: RE | Admit: 2023-07-28 | Discharge: 2023-07-28 | Disposition: A | Discharge status: Home / Self Care | Payer: Medicare Other | Source: Ambulatory Visit | Attending: Surgery | Admitting: Surgery

## 2023-07-28 ENCOUNTER — Ambulatory Visit: Admission: RE | Admit: 2023-07-28 | Payer: Medicare Other | Source: Ambulatory Visit

## 2023-07-28 ENCOUNTER — Other Ambulatory Visit: Payer: Self-pay | Admitting: Surgery

## 2023-07-28 DIAGNOSIS — C50912 Malignant neoplasm of unspecified site of left female breast: Secondary | ICD-10-CM

## 2023-07-28 DIAGNOSIS — C50412 Malignant neoplasm of upper-outer quadrant of left female breast: Secondary | ICD-10-CM | POA: Diagnosis not present

## 2023-07-28 HISTORY — PX: BREAST BIOPSY: SHX20

## 2023-07-28 NOTE — H&P (Signed)
History of Present Illness: [name] [name] is a 72 y.o. female who is seen today as an office consultation for evaluation of Breast Cancer  Patient seen today in the [facility] for evaluation of left breast cancer. She was noted to have a mass left breast upper outer quadrant core biopsy proven to be triple negative left breast cancer grade 3 invasive ductal. Patient has no complaints.  Review of Systems: A complete review of systems was obtained from the patient. I have reviewed this information and discussed as appropriate with the patient. See HPI as well for other ROS.    Medical History: Past Medical History:  Diagnosis Date  Anxiety  Arthritis  COPD (chronic obstructive pulmonary disease) (CMS/HHS-HCC)  GERD (gastroesophageal reflux disease)  Hyperlipidemia   Patient Active Problem List  Diagnosis  Adverse reaction to antihyperlipidemic drug, initial encounter  Cervical spondylosis with radiculopathy  Constipation  Coronary arteriosclerosis  Depression with anxiety  Diarrhea  Diverticulosis of colon  Endometriosis  Malignant neoplasm of upper-outer quadrant of left breast in female, estrogen receptor negative (CMS/HHS-HCC)  LLQ abdominal pain  Left inguinal hernia  Laryngopharyngeal reflux  Hiatal hernia  Elevated low-density lipoprotein level   Past Surgical History:  Procedure Laterality Date  .L breast bx 2024  .back surgery  Unknown date  .bilateral cataract extraction  Unknown date  .hysterectomy  Unknown date  .tubal ligation  Unknown date  anterior cervical decompression/discectomy fusion  Unknown date  APPENDECTOMY  Unknown date  COLONOSCOPY  Unknown date  PLEURAL SCARIFICATION FOR REPEAT PNEUMOTHORAX  Unknown date    Allergies  Allergen Reactions  Bupropion Headache  Other reaction(s): headaches  Cefaclor Diarrhea  Other reaction(s): severe diarrhea  Sulfa (Sulfonamide Antibiotics) Unknown  Sulfur (Not Sulfa) Other (See Comments)  Thrush   Latex Other (See Comments) and Rash  blister  blister Other reaction(s): blisters blister  Ofloxacin Diarrhea   Current Outpatient Medications on File Prior to Visit  Medication Sig Dispense Refill  albuterol MDI, PROVENTIL, VENTOLIN, PROAIR, HFA 90 mcg/actuation inhaler Inhale 2 Inhalations into the lungs every 6 (six) hours as needed  famotidine (PEPCID) 20 MG tablet Take 20 mg by mouth 2 (two) times daily as needed  traZODone (DESYREL) 50 MG tablet Take 50 mg by mouth at bedtime as needed   No current facility-administered medications on file prior to visit.   Family History  Problem Relation Age of Onset  Hyperlipidemia (Elevated cholesterol) Mother  Obesity Mother  High blood pressure (Hypertension) Mother  Hyperlipidemia (Elevated cholesterol) Father  Deep vein thrombosis (DVT or abnormal blood clot formation) Sister  Hyperlipidemia (Elevated cholesterol) Sister  High blood pressure (Hypertension) Sister  Hyperlipidemia (Elevated cholesterol) Brother  Obesity Brother    Social History   Tobacco Use  Smoking Status Former  Types: Cigarettes  Smokeless Tobacco Never    Social History   Socioeconomic History  Marital status: Married  Tobacco Use  Smoking status: Former  Types: Cigarettes  Smokeless tobacco: Never  Vaping Use  Vaping status: Never Used  Substance and Sexual Activity  Alcohol use: Never  Drug use: Never   Social Determinants of Health   Received from [employer]  Social Network   Objective:  There were no vitals filed for this visit.  There is no height or weight on file to calculate BMI.  Physical Exam Exam conducted with a chaperone present.  HENT:  Head: Normocephalic.  Eyes:  Pupils: Pupils are equal, round, and reactive to light.  Pulmonary:  Effort: Pulmonary effort  is normal.  Chest:  Breasts: Right: Normal.  Left: Normal.  Musculoskeletal:  Cervical back: Normal range of motion.  Lymphadenopathy:  Upper Body:   Right upper body: No supraclavicular or axillary adenopathy.  Left upper body: No supraclavicular or axillary adenopathy.  Skin: General: Skin is warm.  Neurological:  General: No focal deficit present.  Mental Status: She is alert.  Psychiatric:  Mood and Affect: Mood normal.     Labs, Imaging and Diagnostic Testing: CLINICAL DATA: 72 year old female presents for further evaluation of possible LEFT breast mass on screening mammogram.  EXAM: DIGITAL DIAGNOSTIC UNILATERAL LEFT MAMMOGRAM WITH TOMOSYNTHESIS; ULTRASOUND LEFT BREAST LIMITED  TECHNIQUE: Left digital diagnostic mammography and breast tomosynthesis was performed.; Targeted ultrasound examination of the left breast was performed.  COMPARISON: Previous exam(s).  ACR Breast Density Category c: The breasts are heterogeneously dense, which may obscure small masses.  FINDINGS: Spot compression views of the LEFT breast demonstrate a persistent irregular mass within the UPPER-OUTER LEFT breast.  Targeted ultrasound is performed, showing a 1.7 x 1.4 x 2.2 cm irregular hypoechoic mass at the 1 o'clock position of the LEFT breast 4 cm from the nipple.  No abnormal LEFT axillary lymph nodes are identified.  IMPRESSION: 1. Highly suspicious 2.2 cm UPPER-OUTER LEFT breast mass. Tissue sampling is recommended. No abnormal appearing LEFT axillary lymph nodes.  RECOMMENDATION: Ultrasound-guided LEFT breast biopsy, which will be scheduled.  I have discussed the findings and recommendations with the patient. If applicable, a reminder letter will be sent to the patient regarding the next appointment.  BI-RADS CATEGORY 5: Highly suggestive of malignancy.   Electronically Signed By: Harmon Pier M.D. On: 06/17/2023 10:55  Diagnosis Breast, left, needle core biopsy, upper outer quadrant, 1 o'clock, 4cmfn (ribbon clip) INVASIVE POORLY DIFFERENTIATED DUCTAL CARCINOMA, GRADE 3 (3+3+3) FOCAL HIGH-GRADE DUCTAL CARCINOMA IN  SITU, CRIBRIFORM TYPE WITH FOCAL NECROSIS TUBULE FORMATION: SCORE 3 NUCLEAR PLEOMORPHISM: SCORE 3 MITOTIC COUNT: SCORE 3 TOTAL SCORE: 9 OVERALL GRADE GRADE 3 (9/9) NEGATIVE FOR ANGIOLYMPHATIC INVASION NEGATIVE FOR MICROCALCIFICATIONS TUMOR MEASURES 5 MM IN GREATEST LINEAR EXTENT Diagnosis Note An immunohistochemical stain for E-cadherin performed with adequate control is positive within the tumor supporting a ductal differentiation. Immunohistochemical stains for the breast prognostic markers have been ordered, and these results will be issued within a subsequent addendum to this report. The case is reviewed by Dr. Luisa Hart who concurs with the interpretation. Diagnosis called to Nickie at the [facility] by Dr. Venetia Night on 06/24/2023 at 10:07 AM. Jerene Bears MD Pathologist, Electronic Signature (Case signed 06/25/2023) Specimen Gross and Clinical Information 1 of 3 FINAL for WYNNETTE, DELUCIA [phone]) Specimen Comment TIF: 9:52 AM, CIT < 1 minute; screening detected highly suspicious 2.2cm mass Specimen(s) Obtained: Breast, left, needle core biopsy, upper outer quadrant, 1 o'clock, 4cmfn (ribbon clip) Specimen Clinical Information [facility] Gross Received in formalin labeled with the patient's name Nelva Bush Faron) and "LtBR 1 oclk 4 cmfn" are three pieces of yellow-tan fibrofatty tissue ranging from 1.2 x 0.2 x 0.2 cm to 1.3 x 0.2 x 0.2 cm, submitted in toto in a single cassette. TIF 09:52, CIT <1 minute (ribbon clip). (LEF, 06/23/2023) Stain(s) used in Diagnosis: The following stain(s) were used in diagnosing the case: Her2 FISH, PR-ACIS, Her2 by IHC, KI-67-ACIS, ER-ACIS, E-CAD. The control(s) stained appropriately. ADDITIONAL INFORMATION: 1A-Breast, left, needle core biopsy FLUORESCENCE IN-SITU HYBRIDIZATION Results: GROUP 5: HER2 **NEGATIVE** Equivocal form of amplification of the HER2 gene was detected in the IHC 2+ tissue sample received from  this individual. HER2 FISH was performed by a technologist and cell imaging and analysis on the [employer]. RATIO OF HER2/CEN17 SIGNALS 1.56 AVERAGE HER2 COPY NUMBER PER CELL 2.80 The ratio of HER2/CEN 17 is within the range < 2.0 of HER2/CEN 17 and a copy number of HER2 signals per cell is <4.0. Arch Pathol Lab Med 1:1,2018 Jimmy Picket MD Pathologist, Electronic Signature ( Signed 06/26/2023) 1) Breast, left, needle core biopsy, upper outer quadrant, 1 o'clock, 4 cmfn ( ribbon clip) PROGNOSTIC INDICATORS Results: IMMUNOHISTOCHEMICAL AND MORPHOMETRIC ANALYSIS PERFORMED MANUALLY The tumor cells are EQUIVOCAL for Her2 (2+). Her2 by FISH will be performed and the results sent separately. Estrogen Receptor: 0%, NEGATIVE 2 of 3 FINAL for YOANDRA, PIETILA A [phone]) ADDITIONAL INFORMATION:(continued) Progesterone Receptor: 0%, NEGATIVE Proliferation Marker Ki67: 95% COMMENT: The negative hormone receptor study(ies) in this case has an internal positive control. REFERENCE RANGE ESTROGEN RECEPTOR NEGATIVE 0% POSITIVE =>1% REFERENCE RANGE PROGESTERONE RECEPTOR NEGATIVE 0% POSITIVE =>1% All controls stained appropriately Jimmy Picket MD Pathologist, Electronic Signature  Assessment and Plan:   Diagnoses and all orders for this visit:  Malignant neoplasm of upper-outer quadrant of left breast in female, estrogen receptor negative (CMS/HHS-HCC)   Patient desires breast conserving surgery after reviewing all of her surgical options. She would likely benefit from systemic therapy. For now is refusing a port therefore we will postpone Port-A-Cath placement. Recommend left breast seed localized lumpectomy with left axillary sentinel lymph node mapping. Discussed risk and benefits as well as risk lymphedema with lymph node mapping as well as shoulder stiffness and need for physical therapy.The procedure has been discussed with the patient. Alternatives to surgery have been discussed with the  patient. Risks of surgery include bleeding, Infection, Seroma formation, death, and the need for further surgery. The patient understands and wishes to proceed.    Hayden Rasmussen, MD

## 2023-07-29 ENCOUNTER — Encounter (HOSPITAL_COMMUNITY)
Admission: RE | Disposition: A | Discharge status: Home / Self Care | Payer: Self-pay | Source: Home / Self Care | Attending: Surgery

## 2023-07-29 ENCOUNTER — Ambulatory Visit (HOSPITAL_COMMUNITY): Payer: Medicare Other | Admitting: Vascular Surgery

## 2023-07-29 ENCOUNTER — Other Ambulatory Visit: Payer: Self-pay

## 2023-07-29 ENCOUNTER — Ambulatory Visit (HOSPITAL_COMMUNITY)
Admission: RE | Admit: 2023-07-29 | Discharge: 2023-07-29 | Disposition: A | Discharge status: Home / Self Care | Payer: Medicare Other | Attending: Surgery | Admitting: Surgery

## 2023-07-29 ENCOUNTER — Ambulatory Visit
Admission: RE | Admit: 2023-07-29 | Discharge: 2023-07-29 | Disposition: A | Discharge status: Home / Self Care | Payer: BLUE CROSS/BLUE SHIELD | Source: Ambulatory Visit | Attending: Surgery | Admitting: Surgery

## 2023-07-29 ENCOUNTER — Ambulatory Visit (HOSPITAL_COMMUNITY): Payer: Medicare Other | Admitting: Anesthesiology

## 2023-07-29 ENCOUNTER — Encounter (HOSPITAL_COMMUNITY): Payer: Self-pay | Admitting: Surgery

## 2023-07-29 DIAGNOSIS — C50412 Malignant neoplasm of upper-outer quadrant of left female breast: Secondary | ICD-10-CM | POA: Diagnosis not present

## 2023-07-29 DIAGNOSIS — I2510 Atherosclerotic heart disease of native coronary artery without angina pectoris: Secondary | ICD-10-CM

## 2023-07-29 DIAGNOSIS — N6082 Other benign mammary dysplasias of left breast: Secondary | ICD-10-CM | POA: Diagnosis not present

## 2023-07-29 DIAGNOSIS — Z87891 Personal history of nicotine dependence: Secondary | ICD-10-CM

## 2023-07-29 DIAGNOSIS — N6489 Other specified disorders of breast: Secondary | ICD-10-CM | POA: Diagnosis not present

## 2023-07-29 DIAGNOSIS — Z171 Estrogen receptor negative status [ER-]: Secondary | ICD-10-CM | POA: Insufficient documentation

## 2023-07-29 DIAGNOSIS — G8918 Other acute postprocedural pain: Secondary | ICD-10-CM | POA: Diagnosis not present

## 2023-07-29 DIAGNOSIS — J939 Pneumothorax, unspecified: Secondary | ICD-10-CM | POA: Diagnosis not present

## 2023-07-29 DIAGNOSIS — D0512 Intraductal carcinoma in situ of left breast: Secondary | ICD-10-CM | POA: Diagnosis not present

## 2023-07-29 DIAGNOSIS — C50912 Malignant neoplasm of unspecified site of left female breast: Secondary | ICD-10-CM

## 2023-07-29 DIAGNOSIS — N6032 Fibrosclerosis of left breast: Secondary | ICD-10-CM | POA: Diagnosis not present

## 2023-07-29 HISTORY — PX: BREAST LUMPECTOMY WITH RADIOACTIVE SEED AND SENTINEL LYMPH NODE BIOPSY: SHX6550

## 2023-07-29 SURGERY — BREAST LUMPECTOMY WITH RADIOACTIVE SEED AND SENTINEL LYMPH NODE BIOPSY
Anesthesia: General | Laterality: Left

## 2023-07-29 MED ORDER — CHLORHEXIDINE GLUCONATE 0.12 % MT SOLN
15.0000 mL | Freq: Once | OROMUCOSAL | Status: AC
  Administered 2023-07-29: 15 mL via OROMUCOSAL
  Filled 2023-07-29: qty 15

## 2023-07-29 MED ORDER — CHLORHEXIDINE GLUCONATE CLOTH 2 % EX PADS: 6.0000 | MEDICATED_PAD | Freq: Once | CUTANEOUS | Status: DC

## 2023-07-29 MED ORDER — LACTATED RINGERS IV SOLN: INTRAVENOUS | Status: DC

## 2023-07-29 MED ORDER — 0.9 % SODIUM CHLORIDE (POUR BTL) OPTIME
TOPICAL | Status: DC | PRN
  Administered 2023-07-29: 1000 mL

## 2023-07-29 MED ORDER — PROPOFOL 10 MG/ML IV BOLUS
INTRAVENOUS | Status: AC
  Filled 2023-07-29: qty 20

## 2023-07-29 MED ORDER — CLINDAMYCIN PHOSPHATE 900 MG/50ML IV SOLN
900.0000 mg | INTRAVENOUS | Status: AC
  Administered 2023-07-29: 900 mg via INTRAVENOUS
  Filled 2023-07-29: qty 50

## 2023-07-29 MED ORDER — MAGTRACE LYMPHATIC TRACER
INTRAMUSCULAR | Status: DC | PRN
  Administered 2023-07-29: 2 mL via INTRAMUSCULAR

## 2023-07-29 MED ORDER — DEXAMETHASONE SODIUM PHOSPHATE 10 MG/ML IJ SOLN
INTRAMUSCULAR | Status: AC
  Filled 2023-07-29: qty 1

## 2023-07-29 MED ORDER — FENTANYL CITRATE (PF) 100 MCG/2ML IJ SOLN
25.0000 ug | INTRAMUSCULAR | Status: DC | PRN
  Administered 2023-07-29: 25 ug via INTRAVENOUS

## 2023-07-29 MED ORDER — PROPOFOL 500 MG/50ML IV EMUL
INTRAVENOUS | Status: DC | PRN
Start: 2023-07-29 — End: 2023-07-29
  Administered 2023-07-29: 100 ug/kg/min via INTRAVENOUS
  Administered 2023-07-29: 125 ug/kg/min via INTRAVENOUS

## 2023-07-29 MED ORDER — OXYCODONE HCL 5 MG PO TABS: 5.0000 mg | ORAL_TABLET | Freq: Four times a day (QID) | ORAL | 0 refills | Status: DC | PRN

## 2023-07-29 MED ORDER — BUPIVACAINE-EPINEPHRINE (PF) 0.25% -1:200000 IJ SOLN
INTRAMUSCULAR | Status: AC
  Filled 2023-07-29: qty 30

## 2023-07-29 MED ORDER — DEXAMETHASONE SODIUM PHOSPHATE 10 MG/ML IJ SOLN
INTRAMUSCULAR | Status: DC | PRN
  Administered 2023-07-29: 10 mg via INTRAVENOUS

## 2023-07-29 MED ORDER — OXYCODONE HCL 5 MG/5ML PO SOLN: 5.0000 mg | Freq: Once | ORAL | Status: DC | PRN

## 2023-07-29 MED ORDER — FENTANYL CITRATE (PF) 250 MCG/5ML IJ SOLN
INTRAMUSCULAR | Status: AC
  Filled 2023-07-29: qty 5

## 2023-07-29 MED ORDER — BUPIVACAINE-EPINEPHRINE 0.25% -1:200000 IJ SOLN
INTRAMUSCULAR | Status: DC | PRN
  Administered 2023-07-29: 16 mL

## 2023-07-29 MED ORDER — GABAPENTIN 300 MG PO CAPS
300.0000 mg | ORAL_CAPSULE | ORAL | Status: AC
  Administered 2023-07-29: 300 mg via ORAL
  Filled 2023-07-29: qty 1

## 2023-07-29 MED ORDER — AMISULPRIDE (ANTIEMETIC) 5 MG/2ML IV SOLN: 10.0000 mg | Freq: Once | INTRAVENOUS | Status: DC | PRN

## 2023-07-29 MED ORDER — FENTANYL CITRATE (PF) 100 MCG/2ML IJ SOLN
INTRAMUSCULAR | Status: AC
  Filled 2023-07-29: qty 2

## 2023-07-29 MED ORDER — MIDAZOLAM HCL 2 MG/2ML IJ SOLN
INTRAMUSCULAR | Status: AC
  Filled 2023-07-29: qty 2

## 2023-07-29 MED ORDER — FENTANYL CITRATE (PF) 250 MCG/5ML IJ SOLN
INTRAMUSCULAR | Status: DC | PRN
  Administered 2023-07-29: 50 ug via INTRAVENOUS
  Administered 2023-07-29 (×2): 25 ug via INTRAVENOUS

## 2023-07-29 MED ORDER — OXYCODONE HCL 5 MG PO TABS: 5.0000 mg | ORAL_TABLET | Freq: Once | ORAL | Status: DC | PRN

## 2023-07-29 MED ORDER — BUPIVACAINE HCL (PF) 0.25 % IJ SOLN
INTRAMUSCULAR | Status: DC | PRN
  Administered 2023-07-29: 30 mL via PERINEURAL

## 2023-07-29 MED ORDER — PROPOFOL 10 MG/ML IV BOLUS
INTRAVENOUS | Status: DC | PRN
  Administered 2023-07-29: 30 mg via INTRAVENOUS
  Administered 2023-07-29: 150 mg via INTRAVENOUS

## 2023-07-29 MED ORDER — PHENYLEPHRINE 80 MCG/ML (10ML) SYRINGE FOR IV PUSH (FOR BLOOD PRESSURE SUPPORT)
PREFILLED_SYRINGE | INTRAVENOUS | Status: DC | PRN
  Administered 2023-07-29 (×2): 40 ug via INTRAVENOUS

## 2023-07-29 MED ORDER — LIDOCAINE 2% (20 MG/ML) 5 ML SYRINGE
INTRAMUSCULAR | Status: AC
  Filled 2023-07-29: qty 5

## 2023-07-29 MED ORDER — ORAL CARE MOUTH RINSE: 15.0000 mL | Freq: Once | OROMUCOSAL | Status: AC

## 2023-07-29 MED ORDER — PHENYLEPHRINE 80 MCG/ML (10ML) SYRINGE FOR IV PUSH (FOR BLOOD PRESSURE SUPPORT)
PREFILLED_SYRINGE | INTRAVENOUS | Status: AC
  Filled 2023-07-29: qty 10

## 2023-07-29 MED ORDER — LIDOCAINE 2% (20 MG/ML) 5 ML SYRINGE
INTRAMUSCULAR | Status: DC | PRN
  Administered 2023-07-29: 100 mg via INTRAVENOUS

## 2023-07-29 MED ORDER — ONDANSETRON HCL 4 MG/2ML IJ SOLN
INTRAMUSCULAR | Status: DC | PRN
  Administered 2023-07-29: 4 mg via INTRAVENOUS

## 2023-07-29 MED ORDER — ONDANSETRON HCL 4 MG/2ML IJ SOLN
INTRAMUSCULAR | Status: AC
  Filled 2023-07-29: qty 2

## 2023-07-29 MED ORDER — ACETAMINOPHEN 500 MG PO TABS
1000.0000 mg | ORAL_TABLET | ORAL | Status: AC
  Administered 2023-07-29: 1000 mg via ORAL
  Filled 2023-07-29: qty 2

## 2023-07-29 MED ORDER — MIDAZOLAM HCL 2 MG/2ML IJ SOLN
INTRAMUSCULAR | Status: DC | PRN
  Administered 2023-07-29: 1 mg via INTRAVENOUS

## 2023-07-29 SURGICAL SUPPLY — 53 items
ADH SKN CLS APL DERMABOND .7 (GAUZE/BANDAGES/DRESSINGS) ×1
APL PRP STRL LF DISP 70% ISPRP (MISCELLANEOUS) ×1
APPLIER CLIP 9.375 MED OPEN (MISCELLANEOUS) ×2
APR CLP MED 9.3 20 MLT OPN (MISCELLANEOUS) ×2
BAG COUNTER SPONGE SURGICOUNT (BAG) IMPLANT
BAG SPNG CNTER NS LX DISP (BAG)
BINDER BREAST LRG (GAUZE/BANDAGES/DRESSINGS) IMPLANT
BINDER BREAST XLRG (GAUZE/BANDAGES/DRESSINGS) IMPLANT
CANISTER SUCT 3000ML PPV (MISCELLANEOUS) ×1 IMPLANT
CHLORAPREP W/TINT 26 (MISCELLANEOUS) ×1 IMPLANT
CLIP APPLIE 9.375 MED OPEN (MISCELLANEOUS) ×1 IMPLANT
CNTNR URN SCR LID CUP LEK RST (MISCELLANEOUS) IMPLANT
CONT SPEC 4OZ STRL OR WHT (MISCELLANEOUS) ×3
COVER PROBE W GEL 5X96 (DRAPES) ×1 IMPLANT
COVER SURGICAL LIGHT HANDLE (MISCELLANEOUS) ×1 IMPLANT
DERMABOND ADVANCED .7 DNX12 (GAUZE/BANDAGES/DRESSINGS) ×1 IMPLANT
DEVICE DUBIN SPECIMEN MAMMOGRA (MISCELLANEOUS) ×1 IMPLANT
DRAIN CHANNEL 19F RND (DRAIN) IMPLANT
DRAPE CHEST BREAST 15X10 FENES (DRAPES) ×1 IMPLANT
ELECT CAUTERY BLADE 6.4 (BLADE) ×1 IMPLANT
ELECT REM PT RETURN 9FT ADLT (ELECTROSURGICAL) ×1
ELECTRODE REM PT RTRN 9FT ADLT (ELECTROSURGICAL) ×1 IMPLANT
EVACUATOR SILICONE 100CC (DRAIN) IMPLANT
GAUZE PAD ABD 8X10 STRL (GAUZE/BANDAGES/DRESSINGS) ×1 IMPLANT
GLOVE BIO SURGEON STRL SZ8 (GLOVE) ×1 IMPLANT
GLOVE BIOGEL PI IND STRL 8 (GLOVE) ×1 IMPLANT
GOWN STRL REUS W/ TWL LRG LVL3 (GOWN DISPOSABLE) ×1 IMPLANT
GOWN STRL REUS W/ TWL XL LVL3 (GOWN DISPOSABLE) ×1 IMPLANT
GOWN STRL REUS W/TWL LRG LVL3 (GOWN DISPOSABLE) ×1
GOWN STRL REUS W/TWL XL LVL3 (GOWN DISPOSABLE) ×1
HEMOSTAT ARISTA ABSORB 3G PWDR (HEMOSTASIS) IMPLANT
KIT BASIN OR (CUSTOM PROCEDURE TRAY) ×1 IMPLANT
KIT MARKER MARGIN INK (KITS) ×1 IMPLANT
LIGHT WAVEGUIDE WIDE FLAT (MISCELLANEOUS) IMPLANT
NDL 18GX1X1/2 (RX/OR ONLY) (NEEDLE) IMPLANT
NDL FILTER BLUNT 18X1 1/2 (NEEDLE) IMPLANT
NDL HYPO 25GX1X1/2 BEV (NEEDLE) ×1 IMPLANT
NEEDLE 18GX1X1/2 (RX/OR ONLY) (NEEDLE) IMPLANT
NEEDLE FILTER BLUNT 18X1 1/2 (NEEDLE) ×1 IMPLANT
NEEDLE HYPO 25GX1X1/2 BEV (NEEDLE) ×2 IMPLANT
NS IRRIG 1000ML POUR BTL (IV SOLUTION) ×1 IMPLANT
PACK GENERAL/GYN (CUSTOM PROCEDURE TRAY) ×1 IMPLANT
PACK LAPAROSCOPY BASIN (CUSTOM PROCEDURE TRAY) IMPLANT
PENCIL SMOKE EVACUATOR (MISCELLANEOUS) IMPLANT
SPONGE T-LAP 18X18 ~~LOC~~+RFID (SPONGE) IMPLANT
SUT ETHILON 2 0 FS 18 (SUTURE) IMPLANT
SUT MNCRL AB 4-0 PS2 18 (SUTURE) ×1 IMPLANT
SUT VIC AB 3-0 SH 18 (SUTURE) ×1 IMPLANT
SYR 3ML LL SCALE MARK (SYRINGE) IMPLANT
SYR CONTROL 10ML LL (SYRINGE) ×1 IMPLANT
TOWEL GREEN STERILE (TOWEL DISPOSABLE) ×1 IMPLANT
TOWEL GREEN STERILE FF (TOWEL DISPOSABLE) ×1 IMPLANT
TRACER MAGTRACE VIAL (MISCELLANEOUS) IMPLANT

## 2023-07-29 NOTE — Anesthesia Procedure Notes (Addendum)
    Anesthesia Regional Block: Pectoralis block   Pre-Anesthetic Checklist: , timeout performed,  Correct Patient, Correct Site, Correct Laterality,  Correct Procedure, Correct Position, site marked,  Risks and benefits discussed,  Surgical consent,  Pre-op evaluation,  At surgeon's request and post-op pain management  Laterality: Left  Prep: chloraprep       Needles:  Injection technique: Single-shot  Needle Type: Echogenic Stimulator Needle     Needle Length: 9cm  Needle Gauge: 21     Additional Needles:   Procedures:,,,, ultrasound used (permanent image in chart),,    Narrative:  Start time: 07/29/2023 6:56 AM End time: 07/29/2023 6:59 AM Injection made incrementally with aspirations every 5 mL.  Performed by: Personally  Anesthesiologist: Linton Rump, MD  Additional Notes: Discussed risks and benefits of nerve block including, but not limited to, prolonged and/or permanent nerve injury involving sensory and/or motor function. Monitors were applied and a time-out was performed. The nerve and associated structures were visualized under ultrasound guidance. After negative aspiration, local anesthetic was slowly injected around the nerve. There was no evidence of high pressure during the procedure. There were no paresthesias. VSS remained stable and the patient tolerated the procedure well.

## 2023-07-29 NOTE — Anesthesia Procedure Notes (Signed)
Procedure Name: LMA Insertion Date/Time: 07/29/2023 7:34 AM  Performed by: Nils Pyle, CRNAPre-anesthesia Checklist: Patient identified, Emergency Drugs available, Suction available and Patient being monitored Patient Re-evaluated:Patient Re-evaluated prior to induction Oxygen Delivery Method: Simple face mask Preoxygenation: Pre-oxygenation with 100% oxygen Induction Type: IV induction Ventilation: Mask ventilation without difficulty LMA: LMA inserted LMA Size: 4.0 Placement Confirmation: positive ETCO2 and breath sounds checked- equal and bilateral Dental Injury: Teeth and Oropharynx as per pre-operative assessment

## 2023-07-29 NOTE — Interval H&P Note (Signed)
History and Physical Interval Note:  07/29/2023 7:20 AM  [name] [name] A Dondero  has presented today for surgery, with the diagnosis of LEFT BREAST CANCER.  The various methods of treatment have been discussed with the patient and family. After consideration of risks, benefits and other options for treatment, the patient has consented to  Procedure(s) with comments: LEFT BREAST SEED LUMPECTOMY WITH LEFT SENTINEL LYMPH NODE MAPPING (Left) - PEC BLOCK as a surgical intervention.  The patient's history has been reviewed, patient examined, no change in status, stable for surgery.  I have reviewed the patient's chart and labs.  Questions were answered to the patient's satisfaction.   The procedure has been discussed with the patient. Alternatives to surgery have been discussed with the patient.  Risks of surgery include bleeding,  Infection,  Seroma formation, death,  and the need for further surgery.   The patient understands and wishes to proceed. Sentinel lymph node mapping and dissection has been discussed with the patient.  Risk of bleeding,  Infection,  Seroma formation,  Additional procedures,,  Shoulder weakness ,  Shoulder stiffness,  Nerve and blood vessel injury and reaction to the mapping dyes have been discussed.  Alternatives to surgery have been discussed with the patient.  The patient agrees to proceed.   Avril Busser A Siara Gorder

## 2023-07-29 NOTE — Anesthesia Postprocedure Evaluation (Signed)
Anesthesia Post Note  Patient: [name] [name]  Procedure(s) Performed: LEFT BREAST SEED LUMPECTOMY WITH LEFT SENTINEL LYMPH NODE MAPPING (Left)     Patient location during evaluation: PACU Anesthesia Type: General Level of consciousness: awake Pain management: pain level controlled Vital Signs Assessment: post-procedure vital signs reviewed and stable Respiratory status: spontaneous breathing, nonlabored ventilation and respiratory function stable Cardiovascular status: blood pressure returned to baseline and stable Postop Assessment: no apparent nausea or vomiting Anesthetic complications: no   No notable events documented.  Last Vitals:  Vitals:   07/29/23 1000 07/29/23 1023  BP: 116/78   Pulse: (!) 57 (!) 56  Resp: 14   Temp: (!) 36.1 C   SpO2: 92% 92%    Last Pain:  Vitals:   07/29/23 0945  TempSrc:   PainSc: 3                  Linton Rump

## 2023-07-29 NOTE — Transfer of Care (Signed)
Immediate Anesthesia Transfer of Care Note  Patient: [name] [name]  Procedure(s) Performed: LEFT BREAST SEED LUMPECTOMY WITH LEFT SENTINEL LYMPH NODE MAPPING (Left)  Patient Location: PACU  Anesthesia Type:General and Regional  Level of Consciousness: awake and alert   Airway & Oxygen Therapy: Patient Spontanous Breathing and Patient connected to face mask oxygen  Post-op Assessment: Report given to RN, Post -op Vital signs reviewed and stable, and Patient moving all extremities X 4  Post vital signs: Reviewed and stable  Last Vitals:  Vitals Value Taken Time  BP 146/102 07/29/23 0922  Temp    Pulse 70 07/29/23 0926  Resp 13 07/29/23 0926  SpO2 94 % 07/29/23 0926  Vitals shown include unfiled device data.  Last Pain:  Vitals:   07/29/23 0607  TempSrc:   PainSc: 0-No pain         Complications: No notable events documented.

## 2023-07-29 NOTE — Op Note (Signed)
Preoperative diagnosis: Stage II left breast cancer upper outer quadrant ER negative  Postoperative diagnosis: Same  Procedure: Left breast seed localized lumpectomy with injection of mag trace  Axillary sentinel lymph node mapping, left axillary lymph node dissection  Surgeon: Harriette Bouillon, MD  Anesthesia: LMA with pectoral block and 0.25% Marcaine with epinephrine local  Drains: 19 round  Specimen: Left breast tissue with seed and clip verified by intraoperative imaging, shave margins, left axillary contents  Indications for procedure: The patient is a 72 year old female with triple negative left breast cancer.  She was seen in the multidisciplinary clinic.  Discussion of neoadjuvant chemotherapy, surgery, and surgical options were reviewed multidisciplinary team approach.  She was unsure about chemotherapy and wished proceed with surgery discussion of the above.The procedure has been discussed with the patient. Alternatives to surgery have been discussed with the patient.  Risks of surgery include bleeding,  Infection,  Seroma formation, death,  and the need for further surgery.   The patient understands and wishes to proceed.  Sentinel lymph node mapping and dissection has been discussed with the patient.  Risk of bleeding,  Infection,  Seroma formation,  Additional procedures,,  Shoulder weakness ,  Shoulder stiffness,  Nerve and blood vessel injury and reaction to the mapping dyes have been discussed.  Alternatives to surgery have been discussed with the patient.  The patient agrees to proceed.      Description of procedure: The patient was met in the holding area and questions were answered.  Names placed as an outpatient breast.  Films were available for review.  Left side was marked as correct site.  Pectoral block placed by anesthesia in the holding area protocol.  She was taken to the operating room.  She is placed supine upon the OR table.  After induction of general esthesia,  timeout performed and 2 cc of MAC tracer injected under sterile conditions in the left breast.  Plexus.  5 minutes massaged were used.  Her left breast was then prepped and draped in sterile fashion a second timeout performed.  Films were available for review in the operating.  Neoprobe was used to identify the seed left breast upper outer quadrant and a curvilinear incision was made over the signal.  All tissue around the seed and clip were excised with grossly negative margins.  Of note the area was quite fibrocystic in nature.  I opted to shave all margins separately.  All tissue was oriented with ink.  These were sent to pathology as indicated above.  The cavity is irrigated and made hemostatic with cautery.  Local anesthetic treated throughout.  Clips were used to mark the cavity.  Deep tissue planes were approximated with 3-0 Vicryl.  4 Monocryl used to close the skin in a subcuticular fashion.    Trace probe used.  This pain signal was axilla.  Left Eksir incision was made.  Dissection was carried down to the level 1 deep contents.  Unfortunately, there is not sufficient uptake of tracer despite significant interrogation of the extra contents.  Also, grossly the nodes were abnormal.  There were multiple bulky nodes throughout the level 1 contents.  Given the poor tracer uptake, triple negative cancer, and bulky adenopathy I felt lymph node dissection is most appropriate neck step given the significant abnormal appearance of her axilla.  Extra contents were dissected off the long thoracic nerve carefully, thoracodorsal trunk and extra vein.  All contents with the structures were removed en bloc.  Small vessels were  controlled with clips.  The long thoracic nerve, thoracodorsal trunk and extra vein were preserved.  Lymphatics were controlled with clips and cautery.  All contents were sent to pathology.  The cavity was irrigated.  Arista was placed.  Through separate stab incision 19 round drain was placed  and secured to skin with 2-0 nylon.  Deep tissue planes were approximated 3-0 Vicryl.  4 Monocryl was used to close all skin incisions.  Dermabond applied.  All counts found to be correct.  Patient was awoke extubated taken to recovery in satisfactory condition.

## 2023-07-29 NOTE — Discharge Instructions (Signed)

## 2023-07-30 ENCOUNTER — Encounter (HOSPITAL_COMMUNITY): Payer: Self-pay | Admitting: Surgery

## 2023-08-01 ENCOUNTER — Encounter: Payer: Self-pay | Admitting: Surgery

## 2023-08-04 ENCOUNTER — Encounter: Payer: Self-pay | Admitting: *Deleted

## 2023-08-14 ENCOUNTER — Telehealth: Payer: Self-pay | Admitting: Gastroenterology

## 2023-08-14 NOTE — Telephone Encounter (Signed)
Please check CMP, CBC and C.diff. Thanks

## 2023-08-14 NOTE — Telephone Encounter (Signed)
Inbound call from patient stating she has been having uncontrollable diarrhea, yellow mucus in her stool, and abdominal pain. Patient is scheduled for next available appointment. Patient requesting a call back to discuss further and options to help. Please advise, thank you.

## 2023-08-14 NOTE — Telephone Encounter (Signed)
Refer to telephone encounter of 07/22/23. Patient was diagnosed with Norovirus in July. She calls today with complaints of worsening symptoms. She has from 5 to 9 loose bowel movements a day. She describes the stool as slime and water. Denies any new medications including no antibiotics or chemotherapy. She will start radiation therapy once her surgical incision has healed. With the 7 day hold on the appointments, I can get her an appointment next Friday at the earliest. Do you want any labs?

## 2023-08-15 ENCOUNTER — Other Ambulatory Visit (INDEPENDENT_AMBULATORY_CARE_PROVIDER_SITE_OTHER): Payer: Medicare Other

## 2023-08-15 ENCOUNTER — Other Ambulatory Visit: Payer: Self-pay

## 2023-08-15 DIAGNOSIS — R197 Diarrhea, unspecified: Secondary | ICD-10-CM

## 2023-08-15 LAB — COMPREHENSIVE METABOLIC PANEL
ALT: 10 U/L (ref 0–35)
AST: 12 U/L (ref 0–37)
Albumin: 3.9 g/dL (ref 3.5–5.2)
Alkaline Phosphatase: 78 U/L (ref 39–117)
BUN: 16 mg/dL (ref 6–23)
CO2: 26 mEq/L (ref 19–32)
Calcium: 9.4 mg/dL (ref 8.4–10.5)
Chloride: 104 mEq/L (ref 96–112)
Creatinine, Ser: 1.35 mg/dL — ABNORMAL HIGH (ref 0.40–1.20)
GFR: 39.4 mL/min — ABNORMAL LOW (ref >60.00)
Glucose, Bld: 110 mg/dL — ABNORMAL HIGH (ref 70–99)
Potassium: 4.1 mEq/L (ref 3.5–5.1)
Sodium: 138 mEq/L (ref 135–145)
Total Bilirubin: 0.3 mg/dL (ref 0.2–1.2)
Total Protein: 6.9 g/dL (ref 6.0–8.3)

## 2023-08-15 LAB — CBC WITH DIFFERENTIAL/PLATELET
Basophils Absolute: 0 10*3/uL (ref 0.0–0.1)
Basophils Relative: 0.5 % (ref 0.0–3.0)
Eosinophils Absolute: 0.1 10*3/uL (ref 0.0–0.7)
Eosinophils Relative: 1.9 % (ref 0.0–5.0)
HCT: 43.1 % (ref 36.0–46.0)
Hemoglobin: 13.7 g/dL (ref 12.0–15.0)
Lymphocytes Relative: 18.2 % (ref 12.0–46.0)
Lymphs Abs: 1.4 10*3/uL (ref 0.7–4.0)
MCHC: 31.8 g/dL (ref 30.0–36.0)
MCV: 95.2 fl (ref 78.0–100.0)
Monocytes Absolute: 0.4 10*3/uL (ref 0.1–1.0)
Monocytes Relative: 4.9 % (ref 3.0–12.0)
Neutro Abs: 5.6 10*3/uL (ref 1.4–7.7)
Neutrophils Relative %: 74.5 % (ref 43.0–77.0)
Platelets: 266 10*3/uL (ref 150.0–400.0)
RBC: 4.53 Mil/uL (ref 3.87–5.11)
RDW: 15 % (ref 11.5–15.5)
WBC: 7.6 10*3/uL (ref 4.0–10.5)

## 2023-08-15 NOTE — Telephone Encounter (Signed)
Patient contacted and agrees to come for labs. Will use [facility] for the C-Diff test. Appointment 08/22/23 at 10:30 am to see Doug Sou, PA.

## 2023-08-16 DIAGNOSIS — R197 Diarrhea, unspecified: Secondary | ICD-10-CM | POA: Diagnosis not present

## 2023-08-17 NOTE — Therapy (Unsigned)
OUTPATIENT PHYSICAL THERAPY BREAST CANCER POST OP FOLLOW UP   Patient Name: [name] [name] MRN: [id number] DOB:October 03, 1951, 72 y.o., female Today's Date: 08/18/2023  END OF SESSION:  PT End of Session - 08/18/23 1004     Visit Number 2    Number of Visits 10    PT Start Time 1001    PT Stop Time 1040    PT Time Calculation (min) 39 min    Activity Tolerance Patient tolerated treatment well    Behavior During Therapy WFL for tasks assessed/performed             Past Medical History:  Diagnosis Date   Adenomatous colon polyp    Allergy    Anxiety    Arthritis    bilateral hands. lower back   Cancer [facility])    skin   Cataract    bilateral   COPD (chronic obstructive pulmonary disease) (HCC)    Depression    Diverticulosis    Endometriosis    GERD (gastroesophageal reflux disease)    Glaucoma    Headache    Migraines   Hiatal hernia    IBS (irritable bowel syndrome)    Inguinal hernia    Pneumothorax 1987   PONV (postoperative nausea and vomiting)    Past Surgical History:  Procedure Laterality Date   ABDOMINAL HYSTERECTOMY  1992   ANTERIOR CERVICAL DECOMP/DISCECTOMY FUSION N/A 09/23/2022   Procedure: Anterior Decompression Fusion,PLATE/SCREWS Cervical five-six; REMOVAL OLD PLATE;  Surgeon: Tressie Stalker, MD;  Location: [facility] OR;  Service: Neurosurgery;  Laterality: N/A;   APPENDECTOMY  1992   BACK SURGERY     cervical   BREAST BIOPSY Left 06/23/2023   [place] LT BREAST BX W LOC DEV 1ST LESION IMG BX SPEC US GUIDE 06/23/2023 [facility]   BREAST BIOPSY Left 07/28/2023   [place] LT RADIOACTIVE SEED LOC 07/28/2023 [facility]   BREAST LUMPECTOMY WITH RADIOACTIVE SEED AND SENTINEL LYMPH NODE BIOPSY Left 07/29/2023   Procedure: LEFT BREAST SEED LUMPECTOMY WITH LEFT SENTINEL LYMPH NODE MAPPING;  Surgeon: Harriette Bouillon, MD;  Location: [facility] OR;  Service: General;  Laterality: Left;  PEC BLOCK   CATARACT EXTRACTION Bilateral    2021, 2022   COLONOSCOPY  2023    PLEURAL SCARIFICATION     ruptured disk     torn tendon     right arm   TUBAL LIGATION     Patient Active Problem List   Diagnosis Date Noted   Genetic testing 07/11/2023   Malignant neoplasm of upper-outer quadrant of left breast in female, estrogen receptor negative (HCC) 06/30/2023   Cervical spondylosis with radiculopathy 09/23/2022   Elevated low-density lipoprotein level 06/14/2022   Coronary arteriosclerosis 06/14/2022   Adverse reaction to antihyperlipidemic drug, initial encounter 06/14/2022   Diarrhea 01/27/2019   Change in bowel habits 01/05/2019   Left inguinal hernia 06/27/2014   Bloating 05/17/2013   Unspecified constipation 05/17/2013   Pneumothorax 10/17/2008   HIATAL HERNIA 10/17/2008   DIVERTICULOSIS, COLON 10/17/2008   ENDOMETRIOSIS 10/17/2008   LLQ abdominal pain 10/17/2008   DEPRESSION, HX OF 10/17/2008   COLONIC POLYPS, ADENOMATOUS, HX OF 10/17/2008   PNEUMOTHORAX 10/17/2008    REFERRING PROVIDER: Dr. Harriette Bouillon  REFERRING DIAG: 07/29/2023  THERAPY DIAG:  Malignant neoplasm of upper-outer quadrant of left breast in female, estrogen receptor negative (HCC)  Abnormal posture  Aftercare following surgery for neoplasm  Localized edema  Stiffness of left shoulder, not elsewhere classified  Rationale for Evaluation and Treatment: Rehabilitation  ONSET  DATE: 07/29/2023  SUBJECTIVE:                                                                                                                                                                                           SUBJECTIVE STATEMENT: Patient reports she underwent a left lumpectomy and sentinel node biopsy (13 negative nodes) on 07/29/2023. She is not going to do chemotherapy per her request but will do radiation. She had a drain due to the number of nodes removed and the drain was removed 08/11/2023. Her surgeon told her she could stop wearing her compression bra after the drain was removed. Since  then, her breast has gotten very swollen and tender.  PERTINENT HISTORY:  Patient was diagnosed on 08/15/2023 with left grade 3 invasive ductal carcinoma breast cancer. She underwent a left lumpectomy and sentinel node biopsy (13 negative nodes) on 07/29/2023. It is triple negative with a Ki67 of 95%. She has a history of a cervical fusion in 08/2022.   PATIENT GOALS:  Reassess how my recovery is going related to arm function, pain, and swelling.  PAIN:  Are you having pain? Yes: NPRS scale: 3/10 Pain location: left breast Pain description: tender Aggravating factors: Riding in a car; doing too many activities Relieving factors: Laying down  PRECAUTIONS: Recent Surgery, left UE Lymphedema risk  RED FLAGS: None   ACTIVITY LEVEL / LEISURE: She has not returned to exercise except her post op HEP   OBJECTIVE:   PATIENT SURVEYS:  QUICK DASH:  Quick Dash - 08/18/23 0001     Open a tight or new jar Moderate difficulty    Do heavy household chores (wash walls, wash floors) Mild difficulty    Carry a shopping bag or briefcase No difficulty    Wash your back Mild difficulty    Use a knife to cut food No difficulty    Recreational activities in which you take some force or impact through your arm, shoulder, or hand (golf, hammering, tennis) Unable    During the past week, to what extent has your arm, shoulder or hand problem interfered with your normal social activities with family, friends, neighbors, or groups? Modererately    During the past week, to what extent has your arm, shoulder or hand problem limited your work or other regular daily activities Quite a bit    Arm, shoulder, or hand pain. None    Tingling (pins and needles) in your arm, shoulder, or hand None    Difficulty Sleeping Mild difficulty    DASH Score 31.82 %              OBSERVATIONS: Significant  swelling in left breast, mostly laterally at incision sites. Seromas appear to be present. Pt very tender to  palpation. Applied compression foam in loose fitting bra and encouraged her to don her compression bra using the foam for edema reduction and pain relief.  POSTURE:  Forward head and rounded shoulders posture  LYMPHEDEMA ASSESSMENT:   UPPER EXTREMITY AROM/PROM:   A/PROM RIGHT   eval    Shoulder extension 42  Shoulder flexion 160  Shoulder abduction 159  Shoulder internal rotation 58  Shoulder external rotation 78                          (Blank rows = not tested)   A/PROM LEFT   eval LEFT 08/18/2023  Shoulder extension 50 44  Shoulder flexion 136 125  Shoulder abduction 166 141  Shoulder internal rotation 61 60  Shoulder external rotation 78 67                          (Blank rows = not tested)   UPPER EXTREMITY STRENGTH: WNL   LYMPHEDEMA ASSESSMENTS:    LANDMARK RIGHT   eval RIGHT 08/18/2023  10 cm proximal to olecranon process 30.8 30.5  Olecranon process 26.7 27.1  10 cm proximal to ulnar styloid process 21.9 21.2  Just proximal to ulnar styloid process 17 17.2  Across hand at thumb web space 18.7 19.5  At base of 2nd digit 6.5 6.3  (Blank rows = not tested)   LANDMARK LEFT   eval LEFT 08/18/2023  10 cm proximal to olecranon process 31.7 31.1  Olecranon process 27.7 28  10  cm proximal to ulnar styloid process 21.8 21.4  Just proximal to ulnar styloid process 17.3 17.7  Across hand at thumb web space 19.5 20  At base of 2nd digit 6.3 6.7  (Blank rows = not tested)  Surgery type/Date: 07/29/2023 left lumpectomy and sentinel node biopsy Number of lymph nodes removed: 13 Current/past treatment (chemo, radiation, hormone therapy): none Other symptoms:  Heaviness/tightness Yes Pain Yes Pitting edema No Infections No Decreased scar mobility Yes Stemmer sign No  PATIENT EDUCATION:  Education details: HEP Person educated: Patient Education method: Explanation Education comprehension: verbalized understanding  HOME EXERCISE PROGRAM: Reviewed previously  given post op HEP.   ASSESSMENT:  CLINICAL IMPRESSION: Patient is having significant pain and swelling in her left breast since her drain was removed. She underwent a left breast lumpectomy and had 13 negative nodes removed on 07/29/2023. Since her drain was removed, she has had increased left breast swelling and pain. Her incisions are healing well but her shoulder ROM is not back to baseline and may be limited by pain and swelling. She will benefit from PT to regain shoulder ROM and function, reduce edema, and improve scar tissue.  Pt will benefit from skilled therapeutic intervention to improve on the following deficits: Decreased knowledge of precautions, impaired UE functional use, pain, decreased ROM, postural dysfunction.   PT treatment/interventions: ADL/Self care home management, Therapeutic exercises, Therapeutic activity, Patient/Family education, Self Care, Manual lymph drainage, scar mobilization, Manual therapy, and Re-evaluation   GOALS: Goals reviewed with patient? Yes  LONG TERM GOALS:  (STG=LTG)  GOALS Name Target Date  Goal status  1 Pt will demonstrate she has regained full shoulder ROM and function post operatively compared to baselines.   09/15/2023 IN PROGESS  2 Patient will improve her DASH score to </= 12 for improved overall function  in her left arm 09/15/2023 INITIAL  3 Patient will report >/= 50% pain reduction in left breast to tolerate a walking program, riding in a car, etc. 09/15/2023 INITIAL  4 Patient will verbalize good understanding of lymphedema risk reduction practices. 09/15/2023 INITIAL     PLAN:  PT FREQUENCY/DURATION: 2x/week for 4 weeks  PLAN FOR NEXT SESSION: PROM left shoulder, instruct with ROM exercise progression from post op HEP, assess for improvement in left breast swelling; scar massage if tolerated (today's pain barely tolerates light palpation)   [facility]  [address]  [place] [address]  [phone]  After Breast Cancer Class It is recommended you attend the ABC class to be educated on lymphedema risk reduction. This class is free of charge and lasts for 1 hour. It is a 1-time class. You will need to download the TEAMS app either on your phone or computer. We will send you a link the night before or the morning of the class. You should be able to click on that link to join the class. This is not a confidential class. You don't have to turn your camera on, but other participants may be able to see your email address.  Scar massage You can begin gentle scar massage to you incision sites. Gently place one hand on the incision and move the skin (without sliding on the skin) in various directions. Do this for a few minutes and then you can gently massage either coconut oil or vitamin E cream into the scars.  Compression garment You should continue wearing your compression bra until you feel like you no longer have swelling.  Home exercise Program Continue doing the exercises you were given until you feel like you can do them without feeling any tightness at the end.   Walking Program Studies show that 30 minutes of walking per day (fast enough to elevate your heart rate) can significantly reduce the risk of a cancer recurrence. If you can't walk due to other medical reasons, we encourage you to find another activity you could do (like a stationary bike or water exercise).  Posture After breast cancer surgery, people frequently sit with rounded shoulders posture because it puts their incisions on slack and feels better. If you sit like this and scar tissue forms in that position, you can become very tight and have pain sitting or standing with good posture. Try to be aware of your posture and sit and stand up tall to heal properly.  Follow up PT: It is recommended you return every 3 months for the first 3 years following surgery to be assessed on the SOZO machine for an L-Dex score. This  helps prevent clinically significant lymphedema in 95% of patients. These follow up screens are 10 minute appointments that you are not billed for. You're scheduled for October 28th, 2024 at 10:50 am.  Bethann Punches, PT 08/18/23 10:51 AM

## 2023-08-18 ENCOUNTER — Encounter: Payer: Self-pay | Admitting: Physical Therapy

## 2023-08-18 ENCOUNTER — Ambulatory Visit: Payer: Medicare Other | Attending: Surgery | Admitting: Physical Therapy

## 2023-08-18 DIAGNOSIS — Z483 Aftercare following surgery for neoplasm: Secondary | ICD-10-CM | POA: Diagnosis not present

## 2023-08-18 DIAGNOSIS — R293 Abnormal posture: Secondary | ICD-10-CM | POA: Insufficient documentation

## 2023-08-18 DIAGNOSIS — R600 Localized edema: Secondary | ICD-10-CM | POA: Insufficient documentation

## 2023-08-18 DIAGNOSIS — Z171 Estrogen receptor negative status [ER-]: Secondary | ICD-10-CM | POA: Diagnosis not present

## 2023-08-18 DIAGNOSIS — C50412 Malignant neoplasm of upper-outer quadrant of left female breast: Secondary | ICD-10-CM | POA: Diagnosis not present

## 2023-08-18 DIAGNOSIS — M25612 Stiffness of left shoulder, not elsewhere classified: Secondary | ICD-10-CM | POA: Insufficient documentation

## 2023-08-18 NOTE — Patient Instructions (Signed)
[facility]  [address]  [place] [address]  [phone]  After Breast Cancer Class It is recommended you attend the ABC class to be educated on lymphedema risk reduction. This class is free of charge and lasts for 1 hour. It is a 1-time class. You will need to download the TEAMS app either on your phone or computer. We will send you a link the night before or the morning of the class. You should be able to click on that link to join the class. This is not a confidential class. You don't have to turn your camera on, but other participants may be able to see your email address.  Scar massage You can begin gentle scar massage to you incision sites. Gently place one hand on the incision and move the skin (without sliding on the skin) in various directions. Do this for a few minutes and then you can gently massage either coconut oil or vitamin E cream into the scars.  Compression garment You should continue wearing your compression bra until you feel like you no longer have swelling.  Home exercise Program Continue doing the exercises you were given until you feel like you can do them without feeling any tightness at the end.   Walking Program Studies show that 30 minutes of walking per day (fast enough to elevate your heart rate) can significantly reduce the risk of a cancer recurrence. If you can't walk due to other medical reasons, we encourage you to find another activity you could do (like a stationary bike or water exercise).  Posture After breast cancer surgery, people frequently sit with rounded shoulders posture because it puts their incisions on slack and feels better. If you sit like this and scar tissue forms in that position, you can become very tight and have pain sitting or standing with good posture. Try to be aware of your posture and sit and stand up tall to heal properly.  Follow up PT: It is recommended you return every 3 months for the  first 3 years following surgery to be assessed on the SOZO machine for an L-Dex score. This helps prevent clinically significant lymphedema in 95% of patients. These follow up screens are 10 minute appointments that you are not billed for. You're scheduled for October 28th, 2024 at 10:50 am.

## 2023-08-21 ENCOUNTER — Ambulatory Visit: Payer: Medicare Other | Admitting: Rehabilitation

## 2023-08-21 ENCOUNTER — Encounter: Payer: Self-pay | Admitting: Rehabilitation

## 2023-08-21 DIAGNOSIS — R600 Localized edema: Secondary | ICD-10-CM | POA: Diagnosis not present

## 2023-08-21 DIAGNOSIS — Z171 Estrogen receptor negative status [ER-]: Secondary | ICD-10-CM

## 2023-08-21 DIAGNOSIS — Z483 Aftercare following surgery for neoplasm: Secondary | ICD-10-CM

## 2023-08-21 DIAGNOSIS — C50412 Malignant neoplasm of upper-outer quadrant of left female breast: Secondary | ICD-10-CM | POA: Diagnosis not present

## 2023-08-21 DIAGNOSIS — R293 Abnormal posture: Secondary | ICD-10-CM | POA: Diagnosis not present

## 2023-08-21 DIAGNOSIS — M25612 Stiffness of left shoulder, not elsewhere classified: Secondary | ICD-10-CM | POA: Diagnosis not present

## 2023-08-21 NOTE — Therapy (Signed)
OUTPATIENT PHYSICAL THERAPY BREAST CANCER TREATMENT   Patient Name: [name] [name] MRN: [id number] DOB:[birth year], 72 y.o., female Today's Date: 08/21/2023  END OF SESSION:  PT End of Session - 08/21/23 1243     Visit Number 3    Number of Visits 10    Date for PT Re-Evaluation 01/02/24    PT Start Time 1210    PT Stop Time 1240    PT Time Calculation (min) 30 min    Activity Tolerance Patient tolerated treatment well    Behavior During Therapy [facility] for tasks assessed/performed              Past Medical History:  Diagnosis Date   Adenomatous colon polyp    Allergy    Anxiety    Arthritis    bilateral hands. lower back   Cancer [facility])    skin   Cataract    bilateral   COPD (chronic obstructive pulmonary disease) (HCC)    Depression    Diverticulosis    Endometriosis    GERD (gastroesophageal reflux disease)    Glaucoma    Headache    Migraines   Hiatal hernia    IBS (irritable bowel syndrome)    Inguinal hernia    Pneumothorax 1987   PONV (postoperative nausea and vomiting)    Past Surgical History:  Procedure Laterality Date   ABDOMINAL HYSTERECTOMY  1992   ANTERIOR CERVICAL DECOMP/DISCECTOMY FUSION N/A 09/23/2022   Procedure: Anterior Decompression Fusion,PLATE/SCREWS Cervical five-six; REMOVAL OLD PLATE;  Surgeon: Tressie Stalker, MD;  Location: [facility] OR;  Service: Neurosurgery;  Laterality: N/A;   APPENDECTOMY  1992   BACK SURGERY     cervical   BREAST BIOPSY Left 06/23/2023   [place] LT BREAST BX W LOC DEV 1ST LESION IMG BX SPEC US GUIDE 06/23/2023 [facility]MOGRAP[facility]   BREAST BIOPSY Left 07/28/2023   [place] LT RADIOACTIVE SEED LOC 07/28/2023 [facility]AMMOGR[facility]   BREAST LUMPECTOMY WITH RADIOACTIVE SEED AND SENTINEL LYMPH NODE BIOPSY Left 07/29/2023   Procedure: LEFT BREAST SEED LUMPECTOMY WITH LEFT SENTINEL LYMPH NODE MAPPING;  Surgeon: Harriette Bouillon, MD;  Location: [facility] OR;  Service: General;  Laterality: Left;  PEC BLOCK   CATARACT EXTRACTION Bilateral    2021,  2022   COLONOSCOPY  2023   PLEURAL SCARIFICATION     ruptured disk     torn tendon     right arm   TUBAL LIGATION     Patient Active Problem List   Diagnosis Date Noted   Genetic testing 07/11/2023   Malignant neoplasm of upper-outer quadrant of left breast in female, estrogen receptor negative (HCC) 06/30/2023   Cervical spondylosis with radiculopathy 09/23/2022   Elevated low-density lipoprotein level 06/14/2022   Coronary arteriosclerosis 06/14/2022   Adverse reaction to antihyperlipidemic drug, initial encounter 06/14/2022   Diarrhea 01/27/2019   Change in bowel habits 01/05/2019   Left inguinal hernia 06/27/2014   Bloating 05/17/2013   Unspecified constipation 05/17/2013   Pneumothorax 10/17/2008   HIATAL HERNIA 10/17/2008   DIVERTICULOSIS, COLON 10/17/2008   ENDOMETRIOSIS 10/17/2008   LLQ abdominal pain 10/17/2008   DEPRESSION, HX OF 10/17/2008   COLONIC POLYPS, ADENOMATOUS, HX OF 10/17/2008   PNEUMOTHORAX 10/17/2008    REFERRING PROVIDER: Dr. Harriette Bouillon  REFERRING DIAG: 07/29/2023  THERAPY DIAG:  Malignant neoplasm of upper-outer quadrant of left breast in female, estrogen receptor negative (HCC)  Abnormal posture  Aftercare following surgery for neoplasm  Localized edema  Stiffness of left shoulder, not elsewhere classified  Rationale for  Evaluation and Treatment: Rehabilitation  ONSET DATE: 07/29/2023  SUBJECTIVE:                                                                                                                                                                                           SUBJECTIVE STATEMENT: I started wearing my bra and it feels much better.  They also said they don't need to drain it.  I have been stretching more and feel like I'm much better.   PERTINENT HISTORY:  Patient was diagnosed on 08/15/2023 with left grade 3 invasive ductal carcinoma breast cancer. She underwent a left lumpectomy and sentinel node biopsy (13  negative nodes) on 07/29/2023. It is triple negative with a Ki67 of 95%. She has a history of a cervical fusion in 08/2022.   PATIENT GOALS:  Reassess how my recovery is going related to arm function, pain, and swelling.  PAIN:  Are you having pain? Yes Lt axilla with stretching 1-2/10  PRECAUTIONS: Recent Surgery, left UE Lymphedema risk  RED FLAGS: None   ACTIVITY LEVEL / LEISURE: She has not returned to exercise except her post op HEP   OBJECTIVE:  OBSERVATIONS: Significant swelling in left breast, mostly laterally at incision sites. Seromas appear to be present. Pt very tender to palpation. Applied compression foam in loose fitting bra and encouraged her to don her compression bra using the foam for edema reduction and pain relief.  POSTURE:  Forward head and rounded shoulders posture  LYMPHEDEMA ASSESSMENT:   UPPER EXTREMITY AROM/PROM:   A/PROM RIGHT   eval    Shoulder extension 42  Shoulder flexion 160  Shoulder abduction 159  Shoulder internal rotation 58  Shoulder external rotation 78                          (Blank rows = not tested)   A/PROM LEFT   eval LEFT 08/18/2023 08/21/23  Shoulder extension 50 44   Shoulder flexion 136 125 145 - pn and pulling in axilla   Shoulder abduction 166 141 162  Shoulder internal rotation 61 60   Shoulder external rotation 78 67 80                          (Blank rows = not tested)   UPPER EXTREMITY STRENGTH: WNL   LYMPHEDEMA ASSESSMENTS:    LANDMARK RIGHT   eval RIGHT 08/18/2023  10 cm proximal to olecranon process 30.8 30.5  Olecranon process 26.7 27.1  10 cm proximal to ulnar styloid process 21.9 21.2  Just proximal to ulnar styloid process  17 17.2  Across hand at thumb web space 18.7 19.5  At base of 2nd digit 6.5 6.3  (Blank rows = not tested)   LANDMARK LEFT   eval LEFT 08/18/2023  10 cm proximal to olecranon process 31.7 31.1  Olecranon process 27.7 28  10  cm proximal to ulnar styloid process 21.8 21.4   Just proximal to ulnar styloid process 17.3 17.7  Across hand at thumb web space 19.5 20  At base of 2nd digit 6.3 6.7  (Blank rows = not tested)  Surgery type/Date: 07/29/2023 left lumpectomy and sentinel node biopsy Number of lymph nodes removed: 13 Current/past treatment (chemo, radiation, hormone therapy): none Other symptoms:  Heaviness/tightness Yes Pain Yes Pitting edema No Infections No Decreased scar mobility Yes Stemmer sign No  TODAY"S TREATMENT 08/21/23 Rechecked AROM PROM Lt shoulder into flexion and abduction and ER to tolerance  - noted new bruising on the patient's upper arm and pt reports she fell down the stairs a bit while holding the rail - no injury.  Supine flexion dowel x 5 with initial cueing and vcs to slow the movement down - no limitation Stargazer 5" x 5 Pulleys reported easy Reviewed lymphedema risk reduction and surveillance  PATIENT EDUCATION:  Education details: HEP Person educated: Patient Education method: Explanation Education comprehension: verbalized understanding  HOME EXERCISE PROGRAM: Reviewed previously given post op HEP.   ASSESSMENT:  CLINICAL IMPRESSION: Pt has returned to baseline AROM and reports her breast feels much better wearing compression.  Discussed POC and decided that she would cancel next week and return if needed as she feels better now.  She demonstrates return to baseline AROM and less breast pain now.    Pt will benefit from skilled therapeutic intervention to improve on the following deficits: Decreased knowledge of precautions, impaired UE functional use, pain, decreased ROM, postural dysfunction.   PT treatment/interventions: ADL/Self care home management, Therapeutic exercises, Therapeutic activity, Patient/Family education, Self Care, Manual lymph drainage, scar mobilization, Manual therapy, and Re-evaluation   GOALS: Goals reviewed with patient? Yes  LONG TERM GOALS:  (STG=LTG)  GOALS Name Target  Date  Goal status  1 Pt will demonstrate she has regained full shoulder ROM and function post operatively compared to baselines.   09/15/2023 MET  2 Patient will improve her DASH score to </= 12 for improved overall function in her left arm 09/15/2023 DEFERRED  3 Patient will report >/= 50% pain reduction in left breast to tolerate a walking program, riding in a car, etc. 09/15/2023 MET  4 Patient will verbalize good understanding of lymphedema risk reduction practices. 09/15/2023 MET     PLAN:  PT FREQUENCY/DURATION: 2x/week for 4 weeks  PLAN FOR NEXT SESSION: PROM left shoulder, instruct with ROM exercise progression from post op HEP, assess for improvement in left breast swelling; scar massage if tolerated (today's pain barely tolerates light palpation)   [facility]  [address]  [place] [address]  [phone]  After Breast Cancer Class It is recommended you attend the ABC class to be educated on lymphedema risk reduction. This class is free of charge and lasts for 1 hour. It is a 1-time class. You will need to download the TEAMS app either on your phone or computer. We will send you a link the night before or the morning of the class. You should be able to click on that link to join the class. This is not a confidential class. You don't have to turn  your camera on, but other participants may be able to see your email address.  Scar massage You can begin gentle scar massage to you incision sites. Gently place one hand on the incision and move the skin (without sliding on the skin) in various directions. Do this for a few minutes and then you can gently massage either coconut oil or vitamin E cream into the scars.  Compression garment You should continue wearing your compression bra until you feel like you no longer have swelling.  Home exercise Program Continue doing the exercises you were given until you feel like you can do them without feeling  any tightness at the end.   Walking Program Studies show that 30 minutes of walking per day (fast enough to elevate your heart rate) can significantly reduce the risk of a cancer recurrence. If you can't walk due to other medical reasons, we encourage you to find another activity you could do (like a stationary bike or water exercise).  Posture After breast cancer surgery, people frequently sit with rounded shoulders posture because it puts their incisions on slack and feels better. If you sit like this and scar tissue forms in that position, you can become very tight and have pain sitting or standing with good posture. Try to be aware of your posture and sit and stand up tall to heal properly.  Follow up PT: It is recommended you return every 3 months for the first 3 years following surgery to be assessed on the SOZO machine for an L-Dex score. This helps prevent clinically significant lymphedema in 95% of patients. These follow up screens are 10 minute appointments that you are not billed for. You're scheduled for October 28th, 2024 at 10:50 am.  Bethann Punches, PT 08/21/23 12:44 PM  PHYSICAL THERAPY DISCHARGE SUMMARY  Visits from Start of Care: 3  Current functional level related to goals / functional outcomes: Goals met - pt is ready to start radiation   Remaining deficits: Lymphedema risk   Education / Equipment: stretches  Plan: Patient agrees to discharge.  Patient is being discharged due to meeting the stated rehab goals.

## 2023-08-22 ENCOUNTER — Ambulatory Visit: Payer: Medicare Other | Admitting: Gastroenterology

## 2023-08-22 ENCOUNTER — Encounter: Payer: Self-pay | Admitting: Gastroenterology

## 2023-08-22 VITALS — BP 124/82 | HR 62 | Ht 69.5 in | Wt 221.0 lb

## 2023-08-22 DIAGNOSIS — R1031 Right lower quadrant pain: Secondary | ICD-10-CM

## 2023-08-22 DIAGNOSIS — R1030 Lower abdominal pain, unspecified: Secondary | ICD-10-CM

## 2023-08-22 DIAGNOSIS — R197 Diarrhea, unspecified: Secondary | ICD-10-CM | POA: Diagnosis not present

## 2023-08-22 NOTE — Patient Instructions (Signed)
You have been scheduled for a CT scan of the abdomen and pelvis at [facility], 1st floor Radiology. You are scheduled on Monday 08/25/23 at 10:30 am.  Please arrive at 8:30 am to drink prep.   Please follow the written instructions below on the day of your exam:   1) Do not eat anything after 6:30 am (4 hours prior to your test)   You may take any medications as prescribed with a small amount of water, if necessary. If you take any of the following medications: METFORMIN, GLUCOPHAGE, GLUCOVANCE, AVANDAMET, RIOMET, FORTAMET, ACTOPLUS MET, JANUMET, GLUMETZA or METAGLIP, you MAY be asked to HOLD this medication 48 hours AFTER the exam.   The purpose of you drinking the oral contrast is to aid in the visualization of your intestinal tract. The contrast solution may cause some diarrhea. Depending on your individual set of symptoms, you may also receive an intravenous injection of x-ray contrast/dye. Plan on being at [facility] for 45 minutes or longer, depending on the type of exam you are having performed.   If you have any questions regarding your exam or if you need to reschedule, you may call [facility] at [phone] between the hours of 8:00 am and 5:00 pm, Monday-Friday.   _______________________________________________________  If your blood pressure at your visit was 140/90 or greater, please contact your primary care physician to follow up on this.  _______________________________________________________  If you are age 70 or older, your body mass index should be between 23-30. Your Body mass index is 32.17 kg/m. If this is out of the aforementioned range listed, please consider follow up with your Primary Care Provider.  If you are age 68 or younger, your body mass index should be between 19-25. Your Body mass index is 32.17 kg/m. If this is out of the aformentioned range listed, please consider follow up with your Primary Care Provider.    ________________________________________________________  The [facility] providers would like to encourage you to use [facility] to communicate with providers for non-urgent requests or questions.  Due to long hold times on the telephone, sending your provider a message by [facility] may be a faster and more efficient way to get a response.  Please allow 48 business hours for a response.  Please remember that this is for non-urgent requests.  _______________________________________________________

## 2023-08-22 NOTE — Progress Notes (Signed)
08/22/2023 [name] [name] [id number] 1951/06/17   HISTORY OF PRESENT ILLNESS:  This is a 72 year old female who is a patient of Dr. Elana Alm.  She presents here today with complaints of diarrhea and lower abdominal pain.  Has been experiencing a lot of GI symptoms since March.  Had diarrhea from antibiotics in March and then again from antibiotics in April.  Then tested positive for norovirus in June.  Was advised that can have somewhat of a postinfectious IBS from norovirus and that it may take several weeks to some months for symptoms to resolve.  She is using a probiotic.  She is negative for C. difficile x 2 including a Diatherix study this week.  She has no new medications.  Needing to use Imodium to be able to go out and about.  Abdominal pain on both left sides that is a constant dull pain.  No rectal bleeding.  Has breast cancer and just had surgery and is supposed to have radiation when incision heals, but does not think that she is going to do chemo.  Colonoscopy April 24, 2022 - Three 4 to 7 mm polyps in the ascending colon, removed with a cold snare. Resected and retrieved. - A single non-bleeding colonic angioectasia. - Mucosal ulceration. Biopsied. - Diverticulosis in the sigmoid colon. - Non-bleeding external and internal hemorrhoids.  1. Surgical [P], colon nos, random sites SERRATED POLYP COMPATIBLE WITH SESSILE SERRATED ADENOMA, ONE FRAGMENT (SEE MICROSCOPIC COMMENT) HYPERPLASTIC POLYP, ONE FRAGMENT BENIGN COLONIC MUCOSA WITH NO DIAGNOSTIC ABNORMALITY, MULTIPLE FRAGMENTS 2. Surgical [P], colon, ascending, polyp (3) SESSILE SERRATED POLYP WITHOUT CYTOLOGIC DYSPLASIA TUBULAR ADENOMA LYMPHOID AGGREGATE NEGATIVE FOR HIGH-GRADE DYSPLASIA AND CARCINOMA   Colonoscopy August 22, 2016 - The perianal and digital rectal examinations were normal. - A 6 mm polyp was found in the ascending colon. The polyp was sessile. The polyp was removed with a cold snare. Resection and  retrieval were complete. - A 3 mm polyp was found in the descending colon. The polyp was sessile. The polyp was removed with a cold biopsy forceps. Resection and retrieval were complete. - Scattered small-mouthed diverticula were found in the sigmoid colon. - The exam was otherwise without abnormality.   Sessile serrated adenoma and hyperplastic polyp respectively     Past Medical History:  Diagnosis Date   Adenomatous colon polyp    Allergy    Anxiety    Arthritis    bilateral hands. lower back   Cancer [facility])    skin   Cataract    bilateral   COPD (chronic obstructive pulmonary disease) (HCC)    Depression    Diverticulosis    Endometriosis    GERD (gastroesophageal reflux disease)    Glaucoma    Headache    Migraines   Hiatal hernia    IBS (irritable bowel syndrome)    Inguinal hernia    Pneumothorax 1987   PONV (postoperative nausea and vomiting)    Past Surgical History:  Procedure Laterality Date   ABDOMINAL HYSTERECTOMY  1992   ANTERIOR CERVICAL DECOMP/DISCECTOMY FUSION N/A 09/23/2022   Procedure: Anterior Decompression Fusion,PLATE/SCREWS Cervical five-six; REMOVAL OLD PLATE;  Surgeon: Tressie Stalker, MD;  Location[facility] OR;  Service: Neurosurgery;  Laterality: N/A;   APPENDECTOMY  1992   BACK SURGERY     cervical   BREAST BIOPSY Left 06/23/2023   [place] LT BREAST BX W LOC DEV 1ST LESION IMG BX SPEC US GUIDE 06/23/2023 [facility]MO[facility]   BREAST BIOPSY Left 07/28/2023  [place] LT RADIOACTIVE SEED LOC 07/28/2023 [facility]MO[facility]   BREAST LUMPECTOMY WITH RADIOACTIVE SEED AND SENTINEL LYMPH NODE BIOPSY Left 07/29/2023   Procedure: LEFT BREAST SEED LUMPECTOMY WITH LEFT SENTINEL LYMPH NODE MAPPING;  Surgeon: Harriette Bouillon, MD;  Location: [facility] OR;  Service: General;  Laterality: Left;  PEC BLOCK   CATARACT EXTRACTION Bilateral    2021, 2022   COLONOSCOPY  2023   PLEURAL SCARIFICATION     ruptured disk     torn tendon     right arm   TUBAL LIGATION      reports that  she quit smoking about 11 years ago. Her smoking use included cigarettes. She started smoking about 51 years ago. She has a 60.2 pack-year smoking history. She has never used smokeless tobacco. She reports that she does not drink alcohol and does not use drugs. family history includes Colon polyps in her sister; Crohn's disease in her sister; Heart failure in her mother; Lung cancer (age of onset: 55) in her sister; Neuropathy in her sister; Osteoporosis in her sister; Thyroid cancer in her maternal grandmother; Thyroid disease in her sister. Allergies  Allergen Reactions   Bupropion Other (See Comments)    headaches   Cefaclor Diarrhea    severe diarrhea   Elemental Sulfur Other (See Comments)    Thrush   Sulfa Antibiotics Other (See Comments)    thrush   Latex Rash and Other (See Comments)    blister    Ofloxacin Diarrhea      Outpatient Encounter Medications as of 08/22/2023  Medication Sig   albuterol (VENTOLIN HFA) 108 (90 Base) MCG/ACT inhaler Inhale 2 puffs into the lungs every 6 (six) hours as needed.   ALPRAZolam (XANAX) 0.25 MG tablet Take 0.25 mg by mouth 3 (three) times daily as needed for anxiety.   aspirin EC 81 MG tablet Take 81 mg by mouth in the morning. Swallow whole.   Bacillus Coagulans-Inulin (PROBIOTIC) 1-250 BILLION-MG CAPS Take 1 capsule by mouth every evening.   Cholecalciferol (VITAMIN D PO) Take 5,000 Units by mouth in the morning.   denosumab (PROLIA) 60 MG/ML SOSY injection Inject 60 mg into the skin every 6 (six) months.   famotidine (PEPCID) 20 MG tablet Take 1 tablet (20 mg total) by mouth 2 (two) times daily. As needed (Patient taking differently: Take 20 mg by mouth daily as needed for heartburn or indigestion.)   fluticasone (FLONASE) 50 MCG/ACT nasal spray Place into both nostrils.   inclisiran (LEQVIO) 284 MG/1.5ML SOSY injection Inject 284 mg into the skin every 6 (six) months. Every three months   loperamide (IMODIUM) 2 MG capsule Take 2 mg by mouth  as needed for diarrhea or loose stools.   MELATONIN-CHAMOMILE PO Take 1 tablet by mouth at bedtime.   promethazine (PHENERGAN) 12.5 MG suppository Place 12.5 mg rectally every 6 (six) hours as needed for nausea or vomiting.   sertraline (ZOLOFT) 100 MG tablet Take 50 mg by mouth in the morning.   umeclidinium-vilanterol (ANORO ELLIPTA) 62.5-25 MCG/ACT AEPB Inhale 1 puff into the lungs daily as needed (respiratory issues.).   [DISCONTINUED] hydrocortisone cream 1 % Apply 1 Application topically 2 (two) times daily as needed for itching (irritation.).   [DISCONTINUED] oxyCODONE (OXY IR/ROXICODONE) 5 MG immediate release tablet Take 1 tablet (5 mg total) by mouth every 6 (six) hours as needed for severe pain.   [DISCONTINUED] traZODone (DESYREL) 50 MG tablet Take 50 mg by mouth at bedtime.   No facility-administered encounter medications on  file as of 08/22/2023.    REVIEW OF SYSTEMS  : All other systems reviewed and negative except where noted in the History of Present Illness.   PHYSICAL EXAM: BP 124/82   Pulse 62   Ht 5' 9.5" (1.765 m)   Wt 221 lb (100.2 kg)   BMI 32.17 kg/m  General: Well developed white female in no acute distress Head: Normocephalic and atraumatic Eyes:  Sclerae anicteric, conjunctiva pink. Ears: Normal auditory acuity Lungs: Clear throughout to auscultation; no W/R/R. Heart: Regular rate and rhythm; no M/R/G. Abdomen: Soft, non-distended.  BS present.  Mild diffuse TTP. Musculoskeletal: Symmetrical with no gross deformities  Skin: No lesions on visible extremities Extremities: No edema  Neurological: Alert oriented x 4, grossly non-focal Psychological:  Alert and cooperative. Normal mood and affect  ASSESSMENT AND PLAN: *72 year old female with diarrhea and lower abdominal pain: Has been experiencing a lot of GI symptoms since March.  Had diarrhea from antibiotics in March and then again from antibiotics in April.  Then tested positive for norovirus in June.   Was advised that can have somewhat of a postinfectious IBS from norovirus and that it may take several weeks to some months for symptoms to resolve.  She is using a probiotic.  She is negative for C. difficile x 2 including a Diatherix study this week.  She has no new medications.  She just had a colonoscopy last year and she had some ulcerated areas noted, but biopsies were normal.  She really does not want to have to proceed with another colonoscopy.  Will proceed with CT scan of the abdomen and pelvis with contrast first.  She can continue using Imodium as needed for now.   CC:  Merri Brunette, MD

## 2023-08-25 ENCOUNTER — Encounter (HOSPITAL_COMMUNITY): Payer: Self-pay

## 2023-08-25 ENCOUNTER — Ambulatory Visit: Payer: Medicare Other

## 2023-08-25 ENCOUNTER — Ambulatory Visit (HOSPITAL_COMMUNITY)
Admission: RE | Admit: 2023-08-25 | Discharge: 2023-08-25 | Disposition: A | Discharge status: Home / Self Care | Payer: Medicare Other | Source: Ambulatory Visit | Attending: Gastroenterology | Admitting: Gastroenterology

## 2023-08-25 DIAGNOSIS — R197 Diarrhea, unspecified: Secondary | ICD-10-CM | POA: Diagnosis not present

## 2023-08-25 DIAGNOSIS — N201 Calculus of ureter: Secondary | ICD-10-CM | POA: Diagnosis not present

## 2023-08-25 DIAGNOSIS — R1031 Right lower quadrant pain: Secondary | ICD-10-CM | POA: Diagnosis not present

## 2023-08-25 DIAGNOSIS — C50912 Malignant neoplasm of unspecified site of left female breast: Secondary | ICD-10-CM | POA: Diagnosis not present

## 2023-08-25 DIAGNOSIS — K5730 Diverticulosis of large intestine without perforation or abscess without bleeding: Secondary | ICD-10-CM | POA: Diagnosis not present

## 2023-08-25 DIAGNOSIS — N281 Cyst of kidney, acquired: Secondary | ICD-10-CM | POA: Diagnosis not present

## 2023-08-25 MED ORDER — IOHEXOL 9 MG/ML PO SOLN
500.0000 mL | ORAL | Status: AC
  Administered 2023-08-25: 1000 mL via ORAL

## 2023-08-25 MED ORDER — SODIUM CHLORIDE (PF) 0.9 % IJ SOLN
INTRAMUSCULAR | Status: AC
  Filled 2023-08-25: qty 50

## 2023-08-25 MED ORDER — IOHEXOL 9 MG/ML PO SOLN
ORAL | Status: AC
  Filled 2023-08-25: qty 1000

## 2023-08-25 MED ORDER — IOHEXOL 300 MG/ML  SOLN
75.0000 mL | Freq: Once | INTRAMUSCULAR | Status: AC | PRN
  Administered 2023-08-25: 75 mL via INTRAVENOUS

## 2023-09-03 ENCOUNTER — Encounter: Payer: Self-pay | Admitting: Gastroenterology

## 2023-09-04 ENCOUNTER — Telehealth: Payer: Self-pay | Admitting: Internal Medicine

## 2023-09-04 NOTE — Telephone Encounter (Signed)
Patient is calling back. Someone reached out to her but the call dropped. She is unsure of who it was. [phone]

## 2023-09-04 NOTE — Telephone Encounter (Signed)
R/T call and confirmed appt with ND on 9/12. There was not any documentation of why she would have received call.

## 2023-09-05 ENCOUNTER — Encounter: Payer: Self-pay | Admitting: *Deleted

## 2023-09-05 ENCOUNTER — Telehealth: Payer: Self-pay | Admitting: Gastroenterology

## 2023-09-05 DIAGNOSIS — Z23 Encounter for immunization: Secondary | ICD-10-CM | POA: Diagnosis not present

## 2023-09-05 DIAGNOSIS — Z171 Estrogen receptor negative status [ER-]: Secondary | ICD-10-CM

## 2023-09-05 NOTE — Telephone Encounter (Signed)
The pt is calling to report that she is still having diarrhea up to 12 times daily with mucous.  She has not seen any blood.  Some discomfort on the right side of the abd.  Imodium does help with the diarrhea but she would like to know "why" she is having the diarrhea.

## 2023-09-05 NOTE — Telephone Encounter (Signed)
Inbound call from patient requesting a call back to discuss CT results.  States she is still having diarrhea and did not see anything to the results regarding this. Requesting a call. Please advise, thank you.

## 2023-09-05 NOTE — Telephone Encounter (Signed)
Please let her know that she does have a kidney stone that is stuck at the junction of her ureter and bladder on the right side.  It is not causing any type of obstruction currently, but if she has ever seen a urologist she probably should make follow-up with them.  No cause of her diarrhea is noted.  She does also have an abnormality in the right middle lobe of her lung near recommending consideration of a dedicated chest CT.  Please forward the CT scan results to her PCP, Dr. Renne Crigler and make her aware to follow-up with them in regards to that as well.  I am assuming that she is still having diarrhea, but check on the status of that please.

## 2023-09-08 ENCOUNTER — Inpatient Hospital Stay: Payer: Medicare Other | Attending: Hematology and Oncology | Admitting: Hematology and Oncology

## 2023-09-08 ENCOUNTER — Other Ambulatory Visit: Payer: Self-pay

## 2023-09-08 ENCOUNTER — Inpatient Hospital Stay: Payer: Medicare Other

## 2023-09-08 VITALS — BP 157/74 | HR 60 | Temp 97.7°F | Resp 18 | Ht 69.5 in | Wt 216.9 lb

## 2023-09-08 DIAGNOSIS — Z79899 Other long term (current) drug therapy: Secondary | ICD-10-CM | POA: Insufficient documentation

## 2023-09-08 DIAGNOSIS — Z90710 Acquired absence of both cervix and uterus: Secondary | ICD-10-CM | POA: Diagnosis not present

## 2023-09-08 DIAGNOSIS — C50412 Malignant neoplasm of upper-outer quadrant of left female breast: Secondary | ICD-10-CM | POA: Diagnosis not present

## 2023-09-08 DIAGNOSIS — Z8349 Family history of other endocrine, nutritional and metabolic diseases: Secondary | ICD-10-CM | POA: Insufficient documentation

## 2023-09-08 DIAGNOSIS — Z881 Allergy status to other antibiotic agents status: Secondary | ICD-10-CM | POA: Diagnosis not present

## 2023-09-08 DIAGNOSIS — Z801 Family history of malignant neoplasm of trachea, bronchus and lung: Secondary | ICD-10-CM | POA: Insufficient documentation

## 2023-09-08 DIAGNOSIS — Z87891 Personal history of nicotine dependence: Secondary | ICD-10-CM | POA: Diagnosis not present

## 2023-09-08 DIAGNOSIS — Z8379 Family history of other diseases of the digestive system: Secondary | ICD-10-CM | POA: Diagnosis not present

## 2023-09-08 DIAGNOSIS — R911 Solitary pulmonary nodule: Secondary | ICD-10-CM | POA: Diagnosis not present

## 2023-09-08 DIAGNOSIS — R197 Diarrhea, unspecified: Secondary | ICD-10-CM | POA: Diagnosis not present

## 2023-09-08 DIAGNOSIS — Z808 Family history of malignant neoplasm of other organs or systems: Secondary | ICD-10-CM | POA: Insufficient documentation

## 2023-09-08 DIAGNOSIS — Z882 Allergy status to sulfonamides status: Secondary | ICD-10-CM | POA: Insufficient documentation

## 2023-09-08 DIAGNOSIS — Z83719 Family history of colon polyps, unspecified: Secondary | ICD-10-CM | POA: Insufficient documentation

## 2023-09-08 DIAGNOSIS — Z888 Allergy status to other drugs, medicaments and biological substances status: Secondary | ICD-10-CM | POA: Insufficient documentation

## 2023-09-08 DIAGNOSIS — Z8249 Family history of ischemic heart disease and other diseases of the circulatory system: Secondary | ICD-10-CM | POA: Insufficient documentation

## 2023-09-08 DIAGNOSIS — F32A Depression, unspecified: Secondary | ICD-10-CM | POA: Insufficient documentation

## 2023-09-08 DIAGNOSIS — F419 Anxiety disorder, unspecified: Secondary | ICD-10-CM | POA: Diagnosis not present

## 2023-09-08 DIAGNOSIS — J449 Chronic obstructive pulmonary disease, unspecified: Secondary | ICD-10-CM | POA: Diagnosis not present

## 2023-09-08 DIAGNOSIS — Z9049 Acquired absence of other specified parts of digestive tract: Secondary | ICD-10-CM | POA: Insufficient documentation

## 2023-09-08 DIAGNOSIS — Z171 Estrogen receptor negative status [ER-]: Secondary | ICD-10-CM

## 2023-09-08 DIAGNOSIS — Z8262 Family history of osteoporosis: Secondary | ICD-10-CM | POA: Diagnosis not present

## 2023-09-08 DIAGNOSIS — K589 Irritable bowel syndrome without diarrhea: Secondary | ICD-10-CM | POA: Diagnosis not present

## 2023-09-08 LAB — CBC WITH DIFFERENTIAL/PLATELET
Abs Immature Granulocytes: 0.01 10*3/uL (ref 0.00–0.07)
Basophils Absolute: 0 10*3/uL (ref 0.0–0.1)
Basophils Relative: 0 %
Eosinophils Absolute: 0.1 10*3/uL (ref 0.0–0.5)
Eosinophils Relative: 2 %
HCT: 44.9 % (ref 36.0–46.0)
Hemoglobin: 14.7 g/dL (ref 12.0–15.0)
Immature Granulocytes: 0 %
Lymphocytes Relative: 17 %
Lymphs Abs: 1.3 10*3/uL (ref 0.7–4.0)
MCH: 31.1 pg (ref 26.0–34.0)
MCHC: 32.7 g/dL (ref 30.0–36.0)
MCV: 95.1 fL (ref 80.0–100.0)
Monocytes Absolute: 0.4 10*3/uL (ref 0.1–1.0)
Monocytes Relative: 6 %
Neutro Abs: 5.6 10*3/uL (ref 1.7–7.7)
Neutrophils Relative %: 75 %
Platelets: 240 10*3/uL (ref 150–400)
RBC: 4.72 MIL/uL (ref 3.87–5.11)
RDW: 14.7 % (ref 11.5–15.5)
WBC: 7.4 10*3/uL (ref 4.0–10.5)
nRBC: 0 % (ref 0.0–0.2)

## 2023-09-08 LAB — CMP (CANCER CENTER ONLY)
ALT: 10 U/L (ref 0–44)
AST: 13 U/L — ABNORMAL LOW (ref 15–41)
Albumin: 4.1 g/dL (ref 3.5–5.0)
Alkaline Phosphatase: 72 U/L (ref 38–126)
Anion gap: 5 (ref 5–15)
BUN: 17 mg/dL (ref 8–23)
CO2: 28 mmol/L (ref 22–32)
Calcium: 9.3 mg/dL (ref 8.9–10.3)
Chloride: 108 mmol/L (ref 98–111)
Creatinine: 1.02 mg/dL — ABNORMAL HIGH (ref 0.44–1.00)
GFR, Estimated: 58 mL/min — ABNORMAL LOW (ref >60)
Glucose, Bld: 103 mg/dL — ABNORMAL HIGH (ref 70–99)
Potassium: 4.9 mmol/L (ref 3.5–5.1)
Sodium: 141 mmol/L (ref 135–145)
Total Bilirubin: 0.4 mg/dL (ref 0.3–1.2)
Total Protein: 7.4 g/dL (ref 6.5–8.1)

## 2023-09-08 NOTE — Progress Notes (Signed)
[facility] CONSULT NOTE  Patient Care Team: Merri Brunette, MD as PCP - General (Internal Medicine) Rachel Moulds, MD as Consulting Physician (Hematology and Oncology) Harriette Bouillon, MD as Consulting Physician (General Surgery) Antony Blackbird, MD as Consulting Physician (Radiation Oncology) Donnelly Angelica, RN as Oncology Nurse Navigator Pershing Proud, RN as Oncology Nurse Navigator  CHIEF COMPLAINTS/PURPOSE OF CONSULTATION:  Newly diagnosed breast cancer  HISTORY OF PRESENTING ILLNESS:  [name] [name] 72 y.o. female is here because of recent diagnosis of left breast cancer  I reviewed her records extensively and collaborated the history with the patient.  SUMMARY OF ONCOLOGIC HISTORY: Oncology History  Malignant neoplasm of upper-outer quadrant of left breast in female, estrogen receptor negative (HCC)  06/03/2023 Mammogram   Screening mammogram bilaterally showed possible mass in the left breast warranting further evaluation.  Diagnostic mammogram confirmed highly suspicious 2.2 cm upper outer left breast mass, no abnormal appearing left axillary lymph nodes   06/23/2023 Pathology Results   Pathology results from the left breast needle core biopsy showed grade 3 invasive poorly differentiated ductal carcinoma, focal high-grade DCIS, cribriform type, negative for angiolymphatic invasion, negative for micro calcs, prognostic showed ER 0% negative, PR 0% negative, HER2 negative and Ki-67 of 95%.   06/30/2023 Initial Diagnosis   Malignant neoplasm of upper-outer quadrant of left breast in female, estrogen receptor negative (HCC)    Genetic Testing   [facility] Custom Panel+RNA was Negative. Of note, a variant of uncertain significance was detected in the MSH3 gene (c.1027+4T>C (Intronic)). Report date is 07/10/2023.  The Custom Hereditary Cancers Panel offered by [facility] includes sequencing and/or deletion duplication testing of the following 46 genes: APC, ATM, AXIN2,  BAP1, BARD1, BMPR1A, BRCA1, BRCA2, BRIP1, CDH1, CDK4, CDKN2A (p14ARF and p16INK4a only), CHEK2, CTNNA1, DICER1, EPCAM (Deletion/duplication testing only), FH, GREM1 (promoter region duplication testing only), HOXB13, KIT, MBD4, MEN1, MLH1, MSH2, MSH3, MSH6, MUTYH, NF1, NHTL1, PALB2, PDGFRA, PMS2, POLD1, POLE, PRKAR1A, PTEN, RAD51C, RAD51D, RET, SMAD4, SMARCA4. STK11, TP53, TSC1, TSC2, and VHL.     This is a pleasant 72 year old female patient with past medical history significant for COPD, not oxygen dependent, irritable bowel syndrome, diarrhea predominant depression, well-controlled on Zoloft, anxiety, arthritis referred to breast [facility] for new diagnosis of left breast invasive ductal carcinoma.  Since her last visit she had lumpectomy, 13 lymph nodes involved and is slowly recovering.  She tells me that she does not want to consider chemotherapy, she understands the benefits from it.  She is hoping to proceed with radiation.  She also was concerned about a nodule that she felt in her neck as well as some CT abdomen that was recently done commenting on a lung nodule.  She was hoping we can help her investigate this.  MEDICAL HISTORY:  Past Medical History:  Diagnosis Date   Adenomatous colon polyp    Allergy    Anxiety    Arthritis    bilateral hands. lower back   Cancer [facility])    skin   Cataract    bilateral   COPD (chronic obstructive pulmonary disease) (HCC)    Depression    Diverticulosis    Endometriosis    GERD (gastroesophageal reflux disease)    Glaucoma    Headache    Migraines   Hiatal hernia    History of left breast cancer 05/2023   IBS (irritable bowel syndrome)    Inguinal hernia    Pneumothorax 1987   PONV (postoperative nausea and vomiting)  SURGICAL HISTORY: Past Surgical History:  Procedure Laterality Date   ABDOMINAL HYSTERECTOMY  1992   ANTERIOR CERVICAL DECOMP/DISCECTOMY FUSION N/A 09/23/2022   Procedure: Anterior Decompression Fusion,PLATE/SCREWS Cervical  five-six; REMOVAL OLD PLATE;  Surgeon: Tressie Stalker, MD;  Location: [facility] OR;  Service: Neurosurgery;  Laterality: N/A;   APPENDECTOMY  1992   BACK SURGERY     cervical   BREAST BIOPSY Left 06/23/2023   [place] LT BREAST BX W LOC DEV 1ST LESION IMG BX SPEC US GUIDE 06/23/2023 [facility] MAMMOGRAPHY   BREAST BIOPSY Left 07/28/2023   [place] LT RADIOACTIVE SEED LOC 07/28/2023 [facility] MAMMOGRAPHY   BREAST LUMPECTOMY WITH RADIOACTIVE SEED AND SENTINEL LYMPH NODE BIOPSY Left 07/29/2023   Procedure: LEFT BREAST SEED LUMPECTOMY WITH LEFT SENTINEL LYMPH NODE MAPPING;  Surgeon: Harriette Bouillon, MD;  Location: [facility] OR;  Service: General;  Laterality: Left;  PEC BLOCK   CATARACT EXTRACTION Bilateral    2021, 2022   COLONOSCOPY  2023   PLEURAL SCARIFICATION     ruptured disk     torn tendon     right arm   TUBAL LIGATION      SOCIAL HISTORY: Social History   Socioeconomic History   Marital status: Married    Spouse name: Not on file   Number of children: 2   Years of education: Not on file   Highest education level: Not on file  Occupational History   Occupation: locksmith  Tobacco Use   Smoking status: Former    Current packs/day: 0.00    Average packs/day: 1.5 packs/day for 40.1 years (60.2 ttl pk-yrs)    Types: Cigarettes    Start date: 12/31/1971    Quit date: 02/10/2012    Years since quitting: 11.5   Smokeless tobacco: Never  Vaping Use   Vaping status: Never Used  Substance and Sexual Activity   Alcohol use: No   Drug use: No   Sexual activity: Never  Other Topics Concern   Not on file  Social History Narrative   Not on file   Social Determinants of Health   Financial Resource Strain: Not on file  Food Insecurity: No Food Insecurity (07/02/2023)   Hunger Vital Sign    Worried About Running Out of Food in the Last Year: Never true    Ran Out of Food in the Last Year: Never true  Transportation Needs: No Transportation Needs (07/02/2023)   PRAPARE - Administrator, Civil Service  (Medical): No    Lack of Transportation (Non-Medical): No  Physical Activity: Not on file  Stress: Not on file  Social Connections: Unknown (05/12/2022)   Received from [facility]   Social Network    Social Network: Not on file  Intimate Partner Violence: Not At Risk (07/02/2023)   Humiliation, Afraid, Rape, and Kick questionnaire    Fear of Current or Ex-Partner: No    Emotionally Abused: No    Physically Abused: No    Sexually Abused: No    FAMILY HISTORY: Family History  Problem Relation Age of Onset   Heart failure Mother    Crohn's disease Sister    Osteoporosis Sister    Neuropathy Sister    Colon polyps Sister    Lung cancer Sister 80       smoked   Thyroid disease Sister    Thyroid cancer Maternal Grandmother    Colon cancer Neg Hx    Breast cancer Neg Hx    Esophageal cancer Neg Hx    Pancreatic  cancer Neg Hx    Stomach cancer Neg Hx     ALLERGIES:  is allergic to bupropion, cefaclor, elemental sulfur, sulfa antibiotics, latex, and ofloxacin.  MEDICATIONS:  Current Outpatient Medications  Medication Sig Dispense Refill   albuterol (VENTOLIN HFA) 108 (90 Base) MCG/ACT inhaler Inhale 2 puffs into the lungs every 6 (six) hours as needed. 18 g 5   ALPRAZolam (XANAX) 0.25 MG tablet Take 0.25 mg by mouth 3 (three) times daily as needed for anxiety.     aspirin EC 81 MG tablet Take 81 mg by mouth in the morning. Swallow whole.     Bacillus Coagulans-Inulin (PROBIOTIC) 1-250 BILLION-MG CAPS Take 1 capsule by mouth every evening.     Cholecalciferol (VITAMIN D PO) Take 5,000 Units by mouth in the morning.     denosumab (PROLIA) 60 MG/ML SOSY injection Inject 60 mg into the skin every 6 (six) months.     famotidine (PEPCID) 20 MG tablet Take 1 tablet (20 mg total) by mouth 2 (two) times daily. As needed (Patient taking differently: Take 20 mg by mouth daily as needed for heartburn or indigestion.) 60 tablet 6   fluticasone (FLONASE) 50 MCG/ACT nasal spray Place into both  nostrils.     inclisiran (LEQVIO) 284 MG/1.5ML SOSY injection Inject 284 mg into the skin every 6 (six) months. Every three months     loperamide (IMODIUM) 2 MG capsule Take 2 mg by mouth as needed for diarrhea or loose stools.     MELATONIN-CHAMOMILE PO Take 1 tablet by mouth at bedtime.     promethazine (PHENERGAN) 12.5 MG suppository Place 12.5 mg rectally every 6 (six) hours as needed for nausea or vomiting.     sertraline (ZOLOFT) 100 MG tablet Take 50 mg by mouth in the morning.     umeclidinium-vilanterol (ANORO ELLIPTA) 62.5-25 MCG/ACT AEPB Inhale 1 puff into the lungs daily as needed (respiratory issues.).     No current facility-administered medications for this visit.    REVIEW OF SYSTEMS:   Constitutional: Denies fevers, chills or abnormal night sweats Eyes: Denies blurriness of vision, double vision or watery eyes Ears, nose, mouth, throat, and face: Denies mucositis or sore throat Respiratory: Denies cough, dyspnea or wheezes Cardiovascular: Denies palpitation, chest discomfort or lower extremity swelling Gastrointestinal:  Denies nausea, heartburn or change in bowel habits Skin: Denies abnormal skin rashes Lymphatics: Denies new lymphadenopathy or easy bruising Neurological:Denies numbness, tingling or new weaknesses Behavioral/Psych: Mood is stable, no new changes  All other systems were reviewed with the patient and are negative.  PHYSICAL EXAMINATION: ECOG PERFORMANCE STATUS: 0 - Asymptomatic  Vitals:   09/08/23 0934  BP: (!) 157/74  Pulse: 60  Resp: 18  Temp: 97.7 F (36.5 C)  SpO2: 95%   Filed Weights   09/08/23 0934  Weight: 216 lb 14.4 oz (98.4 kg)    GENERAL:alert, no distress and comfortable On neck exam, there is no definitive palpable lymphadenopathy, below her surgical scar there is a soft tissue density as well as some palpable tenderness in the thyroid. Chest is clear to auscultation bilaterally No lower extremity edema.  LABORATORY DATA:  I  have reviewed the data as listed Lab Results  Component Value Date   WBC 7.6 08/15/2023   HGB 13.7 08/15/2023   HCT 43.1 08/15/2023   MCV 95.2 08/15/2023   PLT 266.0 08/15/2023   Lab Results  Component Value Date   NA 138 08/15/2023   K 4.1 08/15/2023   CL 104 08/15/2023  CO2 26 08/15/2023    RADIOGRAPHIC STUDIES: I have personally reviewed the radiological reports and agreed with the findings in the report.  ASSESSMENT AND PLAN:  Malignant neoplasm of upper-outer quadrant of left breast in female, estrogen receptor negative (HCC) This is a pleasant 72 year old postmenopausal female patient with newly diagnosed left breast T2 N0 triple negative invasive ductal carcinoma referred to breast [facility] for additional recommendations.  Given triple negative biology and size of the tumor over 2 cm, we have discussed about both neoadjuvant and adjuvant approach. I have discussed neoadjuvant chemotherapy options including keynote 522 versus SCARLET trial versus adjuvant approach.  She has decent performance status but is mostly limited by her diarrhea predominant IBS as well as COPD given her prior history of smoking.  After much discussion we have discussed about proceeding with upfront surgery followed by consideration for adjuvant chemotherapy.  She is here for follow-up.  Since her last visit here she had lumpectomy and this showed a grade three 2.5 cm tumor, 0 out of 13 lymph nodes involved.  We have once again discussed about role of adjuvant chemo given the triple negative biology.  She understands that the benefit of adjuvant chemo is to reduce the risk of recurrence.  She does not however want to proceed with chemotherapy.  She says she is quite overwhelmed and would like to continue active surveillance after radiation.  With regards to the neck nodules as well as the lung nodule, we have discussed about proceeding with CT imaging to further investigate this.  She is a smoker hence at risk for  lung primary.  I will try to call her in about a couple weeks to follow-up on the CT results.  She will otherwise return to clinic after completing adjuvant radiation.   All questions were answered. The patient knows to call the clinic with any problems, questions or concerns.    Rachel Moulds, MD 09/08/23

## 2023-09-08 NOTE — Assessment & Plan Note (Addendum)
This is a pleasant 72 year old postmenopausal female patient with newly diagnosed left breast T2 N0 triple negative invasive ductal carcinoma referred to breast [facility] for additional recommendations.  Given triple negative biology and size of the tumor over 2 cm, we have discussed about both neoadjuvant and adjuvant approach. I have discussed neoadjuvant chemotherapy options including keynote 522 versus SCARLET trial versus adjuvant approach.  She has decent performance status but is mostly limited by her diarrhea predominant IBS as well as COPD given her prior history of smoking.  After much discussion we have discussed about proceeding with upfront surgery followed by consideration for adjuvant chemotherapy.  She is here for follow-up.  Since her last visit here she had lumpectomy and this showed a grade three 2.5 cm tumor, 0 out of 13 lymph nodes involved.  We have once again discussed about role of adjuvant chemo given the triple negative biology.  She understands that the benefit of adjuvant chemo is to reduce the risk of recurrence.  She does not however want to proceed with chemotherapy.  She says she is quite overwhelmed and would like to continue active surveillance after radiation.  With regards to the neck nodules as well as the lung nodule, we have discussed about proceeding with CT imaging to further investigate this.  She is a smoker hence at risk for lung primary.  I will try to call her in about a couple weeks to follow-up on the CT results.  She will otherwise return to clinic after completing adjuvant radiation.

## 2023-09-09 ENCOUNTER — Encounter: Payer: Self-pay | Admitting: *Deleted

## 2023-09-09 ENCOUNTER — Other Ambulatory Visit: Payer: Self-pay

## 2023-09-09 ENCOUNTER — Ambulatory Visit: Payer: Medicare Other | Attending: Surgery | Admitting: Physical Therapy

## 2023-09-09 ENCOUNTER — Encounter: Payer: Self-pay | Admitting: Physical Therapy

## 2023-09-09 DIAGNOSIS — C50412 Malignant neoplasm of upper-outer quadrant of left female breast: Secondary | ICD-10-CM | POA: Diagnosis not present

## 2023-09-09 DIAGNOSIS — I890 Lymphedema, not elsewhere classified: Secondary | ICD-10-CM | POA: Insufficient documentation

## 2023-09-09 DIAGNOSIS — Z171 Estrogen receptor negative status [ER-]: Secondary | ICD-10-CM

## 2023-09-09 DIAGNOSIS — M25612 Stiffness of left shoulder, not elsewhere classified: Secondary | ICD-10-CM | POA: Insufficient documentation

## 2023-09-09 DIAGNOSIS — R293 Abnormal posture: Secondary | ICD-10-CM | POA: Insufficient documentation

## 2023-09-09 DIAGNOSIS — Z483 Aftercare following surgery for neoplasm: Secondary | ICD-10-CM | POA: Diagnosis not present

## 2023-09-09 NOTE — Therapy (Signed)
OUTPATIENT PHYSICAL THERAPY  UPPER EXTREMITY ONCOLOGY EVALUATION  Patient Name: [name] [name] MRN: [id number] DOB:[birth year], 72 y.o., female Today's Date: 09/09/2023  END OF SESSION:  PT End of Session - 09/09/23 1551     Visit Number 1    Number of Visits 9    Date for PT Re-Evaluation 10/07/23    PT Start Time 1505    PT Stop Time 1551    PT Time Calculation (min) 46 min    Activity Tolerance Patient tolerated treatment well    Behavior During Therapy [facility] for tasks assessed/performed             Past Medical History:  Diagnosis Date   Adenomatous colon polyp    Allergy    Anxiety    Arthritis    bilateral hands. lower back   Cancer [facility])    skin   Cataract    bilateral   COPD (chronic obstructive pulmonary disease) (HCC)    Depression    Diverticulosis    Endometriosis    GERD (gastroesophageal reflux disease)    Glaucoma    Headache    Migraines   Hiatal hernia    History of left breast cancer 05/2023   IBS (irritable bowel syndrome)    Inguinal hernia    Pneumothorax 1987   PONV (postoperative nausea and vomiting)    Past Surgical History:  Procedure Laterality Date   ABDOMINAL HYSTERECTOMY  1992   ANTERIOR CERVICAL DECOMP/DISCECTOMY FUSION N/A 09/23/2022   Procedure: Anterior Decompression Fusion,PLATE/SCREWS Cervical five-six; REMOVAL OLD PLATE;  Surgeon: Tressie Stalker, MD;  Location: [facility] OR;  Service: Neurosurgery;  Laterality: N/A;   APPENDECTOMY  1992   BACK SURGERY     cervical   BREAST BIOPSY Left 06/23/2023   [place] LT BREAST BX W LOC DEV 1ST LESION IMG BX SPEC US GUIDE 06/23/2023 [facility] MAMMOGRAPHY   BREAST BIOPSY Left 07/28/2023   [place] LT RADIOACTIVE SEED LOC 07/28/2023 [facility] MAMMOGRAPHY   BREAST LUMPECTOMY WITH RADIOACTIVE SEED AND SENTINEL LYMPH NODE BIOPSY Left 07/29/2023   Procedure: LEFT BREAST SEED LUMPECTOMY WITH LEFT SENTINEL LYMPH NODE MAPPING;  Surgeon: Harriette Bouillon, MD;  Location: [facility] OR;  Service: General;  Laterality: Left;  PEC  BLOCK   CATARACT EXTRACTION Bilateral    2021, 2022   COLONOSCOPY  2023   PLEURAL SCARIFICATION     ruptured disk     torn tendon     right arm   TUBAL LIGATION     Patient Active Problem List   Diagnosis Date Noted   Genetic testing 07/11/2023   Malignant neoplasm of upper-outer quadrant of left breast in female, estrogen receptor negative (HCC) 06/30/2023   Cervical spondylosis with radiculopathy 09/23/2022   Elevated low-density lipoprotein level 06/14/2022   Coronary arteriosclerosis 06/14/2022   Adverse reaction to antihyperlipidemic drug, initial encounter 06/14/2022   Diarrhea 01/27/2019   Change in bowel habits 01/05/2019   Left inguinal hernia 06/27/2014   Bloating 05/17/2013   Unspecified constipation 05/17/2013   Pneumothorax 10/17/2008   HIATAL HERNIA 10/17/2008   DIVERTICULOSIS, COLON 10/17/2008   ENDOMETRIOSIS 10/17/2008   Right lower quadrant abdominal pain 10/17/2008   DEPRESSION, HX OF 10/17/2008   COLONIC POLYPS, ADENOMATOUS, HX OF 10/17/2008   PNEUMOTHORAX 10/17/2008    PCP: Merri Brunette, MD   REFERRING PROVIDER: Rachel Moulds, MD  REFERRING DIAG: C50.412,Z17.1 (ICD-10-CM) - Malignant neoplasm of upper-outer quadrant of left breast in female, estrogen receptor negative (HCC) , L axillary cording  THERAPY DIAG:  Malignant  neoplasm of upper-outer quadrant of left breast in female, estrogen receptor negative (HCC)  Stiffness of left shoulder, not elsewhere classified  Aftercare following surgery for neoplasm  Lymphedema, not elsewhere classified  Abnormal posture  ONSET DATE: 09/01/23  Rationale for Evaluation and Treatment: Rehabilitation  SUBJECTIVE:                                                                                                                                                                                           SUBJECTIVE STATEMENT: I developed cording. It started last week sometime. I noticed it when I was doing  exercises.   PERTINENT HISTORY: Patient was diagnosed on 08/15/2023 with left grade 3 invasive ductal carcinoma breast cancer. She underwent a left lumpectomy and sentinel node biopsy (13 negative nodes) on 07/29/2023. It is triple negative with a Ki67 of 95%. She has a history of a cervical fusion in 08/2022. Plans to start radiation soon.   PAIN:  Are you having pain? Yes NPRS scale: 8/10 Pain location: L arm from axilla to wrist Pain orientation: Left  PAIN TYPE: burning Pain description: intermittent  Aggravating factors: raising arm and moving arm in certain directions  Relieving factors: haven't tried anything  PRECAUTIONS: Other: L UE lymphedema risk, cervical fusion C6, C7  RED FLAGS: Compression fracture: Yes: T12    WEIGHT BEARING RESTRICTIONS: No  FALLS:  Has patient fallen in last 6 months? No  LIVING ENVIRONMENT: Lives with: lives with their spouse Lives in: House/apartment Stairs: Yes; External: 5 steps; can reach both Has following equipment at home: None  OCCUPATION: locksmith  LEISURE: was walking until last week but recently stopped due to stomach issues  HAND DOMINANCE: right   PRIOR LEVEL OF FUNCTION: Independent  PATIENT GOALS: to get my arm back to normal   OBJECTIVE:  COGNITION: Overall cognitive status: Within functional limits for tasks assessed   PALPATION: Cording palpable in L axilla, increased edema noticeable in LUE and L breast  OBSERVATIONS / OTHER ASSESSMENTS: L breast more full than R especially at lateral breast  POSTURE: forward head and rounded shoulders  UPPER EXTREMITY AROM/PROM:  A/PROM RIGHT   eval   Shoulder extension 70  Shoulder flexion 167  Shoulder abduction 166  Shoulder internal rotation 65  Shoulder external rotation 78    (Blank rows = not tested)  A/PROM LEFT   eval  Shoulder extension 65  Shoulder flexion 155  Shoulder abduction 160  Shoulder internal rotation 55  Shoulder external rotation 83     (Blank rows = not tested)    UPPER EXTREMITY STRENGTH:   LYMPHEDEMA ASSESSMENTS:   SURGERY TYPE/DATE:  07/29/23 L lumpectomy and SLNB  NUMBER OF LYMPH NODES REMOVED: 13  CHEMOTHERAPY: pt declined chemo  RADIATION: plans to begin radiation soon  HORMONE TREATMENT: none  INFECTIONS: none   LYMPHEDEMA ASSESSMENTS:   LANDMARK RIGHT  eval  At axilla  35.5  15 cm proximal to olecranon process 30.8  10 cm proximal to olecranon process 30  Olecranon process 26.5  15 cm proximal to ulnar styloid process 23.9  10 cm proximal to ulnar styloid process 20.4  Just proximal to ulnar styloid process 17.4  Across hand at thumb web space 19.5  At base of 2nd digit 6.5  (Blank rows = not tested)  LANDMARK LEFT  eval  At axilla  35.9  15 cm proximal to olecranon process 32.5  10 cm proximal to olecranon process 31.5  Olecranon process 28  15 cm proximal to ulnar styloid process 24.5  10 cm proximal to ulnar styloid process 20.6  Just proximal to ulnar styloid process 18.1  Across hand at thumb web space 20.6  At base of 2nd digit 6.4  (Blank rows = not tested)    QUICK DASH SURVEY:   Neldon Mc - 09/09/23 0001     Open a tight or new jar Severe difficulty    Do heavy household chores (wash walls, wash floors) No difficulty    Carry a shopping bag or briefcase No difficulty    Wash your back Mild difficulty    Use a knife to cut food No difficulty    Recreational activities in which you take some force or impact through your arm, shoulder, or hand (golf, hammering, tennis) Mild difficulty    During the past week, to what extent has your arm, shoulder or hand problem interfered with your normal social activities with family, friends, neighbors, or groups? Not at all    During the past week, to what extent has your arm, shoulder or hand problem limited your work or other regular daily activities Not at all    Arm, shoulder, or hand pain. Moderate    Tingling (pins and  needles) in your arm, shoulder, or hand None    Difficulty Sleeping No difficulty    DASH Score 15.91 %              L-DEX FLOWSHEETS - 09/09/23 1500       L-DEX LYMPHEDEMA SCREENING   Measurement Type Unilateral    L-DEX MEASUREMENT EXTREMITY Upper Extremity    POSITION  Standing    DOMINANT SIDE Right    At Risk Side Left    BASELINE SCORE (UNILATERAL) 4.5    L-DEX SCORE (UNILATERAL) 16.8    VALUE CHANGE (UNILAT) 12.3            The patient was assessed using the L-Dex machine today to produce a lymphedema index baseline score. The patient will be reassessed on a regular basis (typically every 3 months) to obtain new L-Dex scores. If the score is > 6.5 points away from his/her baseline score indicating onset of subclinical lymphedema, it will be recommended to wear a compression garment for 4 weeks, 12 hours per day and then be reassessed. If the score continues to be > 6.5 points from baseline at reassessment, we will initiate lymphedema treatment. Assessing in this manner has a 95% rate of preventing clinically significant lymphedema.   TODAY'S TREATMENT:  DATE: 09/09/23: Pulleys x 2 min in direction of flexion and 2 min in direction of abduction with pt returning therapist demo and feeling less discomfort at end of pulleys Measured pt for compression sleeve after increased Ldex score: Medi Harmony Size 3 EW and issued this to her    PATIENT EDUCATION:  Education details: importance of stretching to decrease discomfort with cording, purchase over the door pulleys for AAROM at home, wear compression sleeve for 12 hours a day for 4 weeks, how to don/doff garments Person educated: Patient Education method: Medical illustrator Education comprehension: verbalized understanding and returned demonstration  HOME EXERCISE  PROGRAM: Continue post op exercises Over the door pulleys Wear compression sleeve 12 hours a day for 4 weeks  ASSESSMENT:  CLINICAL IMPRESSION: Patient is a 72 y.o. female who was seen today for physical therapy evaluation and treatment for left axillary cording. Pt is supposed to begin radiation soon and would have difficulty with positioning due to pain from cording. Pt demonstrates decreased L shoulder ROM with pain at end range secondary to cording. She has swelling in her L breast compared to R breast and discomfort in lateral portion of L breast. Pt would benefit from skilled PT services to improve L shoulder ROM, decrease lymphedema, decrease cording and progress pt towards independent management of ROM and swelling.    OBJECTIVE IMPAIRMENTS: decreased knowledge of condition, decreased knowledge of use of DME, decreased ROM, increased edema, increased fascial restrictions, impaired UE functional use, postural dysfunction, and pain.   ACTIVITY LIMITATIONS: carrying, lifting, and reach over head  PARTICIPATION LIMITATIONS: meal prep, cleaning, laundry, and community activity  PERSONAL FACTORS:  none  are also affecting patient's functional outcome.   REHAB POTENTIAL: Good  CLINICAL DECISION MAKING: Stable/uncomplicated  EVALUATION COMPLEXITY: Low  GOALS: Goals reviewed with patient? Yes  SHORT TERM GOALS=LONG TERM GOALS Target date: 10/07/23  Pt will be independent with self MLD for L UE and L breast for long term management of lymphedema. Baseline: Goal status: INITIAL  2.  Pt will return to the green on SOZO demonstrating reversal of subclinical lymphedema. Baseline:  Goal status: INITIAL  3.  Pt will demonstrate 165 degrees of L shoulder flexion to allow her to reach overhead.  Baseline:  Goal status: INITIAL  4.  Pt will be independent in a home exercise program for continued stretching and strengthening.  Baseline:  Goal status: INITIAL  5.  Pt will report a 75%  improvement in pain and discomfort from axillary cording to allow improved comfort.  Baseline:  Goal status: INITIAL  6.  Pt will obtain appropriate compression garments - sleeve and bra for long term management.  Baseline:  Goal status: INITIAL  PLAN:  PT FREQUENCY: 2x/week  PT DURATION: 4 weeks  PLANNED INTERVENTIONS: Therapeutic exercises, Therapeutic activity, Patient/Family education, Self Care, Joint mobilization, Orthotic/Fit training, Manual lymph drainage, Compression bandaging, scar mobilization, Taping, Vasopneumatic device, Manual therapy, and Re-evaluation  PLAN FOR NEXT SESSION: begin MLD to L breast and LUE, MFR to cording in L axilla, how is sleeve? Does she need to order one that is longer from [employer]?  [facility], PT 09/09/2023, 4:06 PM

## 2023-09-09 NOTE — Telephone Encounter (Signed)
I spoke with the pt and advised that she should be set up for colon.  She declines and states she will think about it and call back if she chooses to proceed with Colon with Dr Lavon Paganini.  Sarita Haver

## 2023-09-10 NOTE — Progress Notes (Signed)
Location of Breast Cancer:  Malignant neoplasm of upper-outer quadrant of left breast in female, estrogen receptor negative   Histology per Pathology Report:  07/29/2023  FINAL MICROSCOPIC DIAGNOSIS:  A. LEFT BREAST, LUMPECTOMY: Invasive poorly differentiated ductal adenocarcinoma with extensive necrosis, grade 3 (3+3+3) High-grade ductal carcinoma in situ, comedo type Tumor measures 2.5 x 2.4 x 2.4 cm (pT2) Negative for angiolymphatic invasion Margins free (3 mm from posterior margin) Breast prognostic markers (from report [id number]): Estrogen receptor negative, progesterone receptor negative, Ki-67 95% and HER2/neu oncoprotein expression group 5 negative by FISH Changes consistent with prior biopsy Rare microcalcifications present within tumor and benign breast Fibrocystic changes including extensive stromal fibrosis  B. LEFT BREAST, ADDITIONAL ANTERIOSUPERIOR MARGIN, EXCISION: Benign breast with fibrocystic changes including cystic dilatation of ducts, apocrine metaplasia and extensive stromal fibrosis Negative for carcinoma  C. LEFT BREAST, ADDITIONAL MEDIAL POSTERIOR MARGIN, EXCISION: Benign breast with fibrocystic changes including cystic dilatation of ducts and stromal fibrosis Negative for carcinoma  D. LEFT BREAST, ADDITIONAL INFERIOLATERAL MARGIN, EXCISION: Benign breast with stromal fibrosis Microcalcifications present within vessel wall Negative for carcinoma  E. LEFT AXILLARY CONTENTS: Thirteen axillary lymph nodes negative for carcinoma (0/13, pN0)  ONCOLOGY TABLE:  INVASIVE CARCINOMA OF THE BREAST:  Resection  Procedure: Lumpectomy with axillary node dissection Specimen Laterality: Left Histologic Type: Ductal carcinoma, NOS Histologic Grade:      Glandular (Acinar)/Tubular Differentiation: 3      Nuclear Pleomorphism: 3      Mitotic Rate: 3      Overall Grade: Grade 3 (9/9 Tumor Size: 2.5 x 2.4 x 2.4 cm Ductal Carcinoma In Situ: High-grade ductal  carcinoma in situ, comedo type present Lymphatic and/or Vascular Invasion: Not identified Treatment Effect in the Breast: No known presurgical therapy Margins: All margins negative for invasive carcinoma      Distance from Closest Margin (mm): 3 mm from posterior margin (final posterior margin at least 13 mm) DCIS Margins: Uninvolved by DCIS      Distance from Closest Margin (mm): At least 5 mm from posterior margin (final posterior margin at least 18 mm) Regional Lymph Nodes:      Number of Lymph Nodes Examined: 13      Number of Sentinel Nodes Examined: 0      Number of Lymph Nodes with Macrometastases (>2 mm): 0      Number of Lymph Nodes with Micrometastases: 0      Number of Lymph Nodes with Isolated Tumor Cells (=0.2 mm or =200 cells): 0 Distant Metastasis: Not applicable Breast Biomarker Testing Performed on Previous Biopsy:      Testing Performed on Case Number: [id number]            Estrogen Receptor: Negative (0)            Progesterone Receptor: Negative (0)            HER2: Group 5 negative by FISH            Ki-67: 95% Pathologic Stage Classification (pTNM, AJCC 8th Edition): pT2, pN0 Representative Tumor Block: A1, A2, A3 Comment(s): None (v4.5.0.0)  Receptor Status: ER(neg), PR (neg), Her2-neu (5 negative by FISH ), Ki-67(95%)  Did patient present with symptoms (if so, please note symptoms) or was this found on screening mammography?: screening mammogram  Past/Anticipated interventions by surgeon, if any: 07-29-23 Procedure: Left breast seed localized lumpectomy with injection of mag trace   Axillary sentinel lymph node mapping, left axillary lymph node dissection   Surgeon: Harriette Bouillon, MD  Past/Anticipated interventions by medical oncology, if any:  Rachel Moulds, MD 09/08/2023  Oncology History  Malignant neoplasm of upper-outer quadrant of left breast in female, estrogen receptor negative (HCC)  06/03/2023 Mammogram    Screening mammogram bilaterally  showed possible mass in the left breast warranting further evaluation.  Diagnostic mammogram confirmed highly suspicious 2.2 cm upper outer left breast mass, no abnormal appearing left axillary lymph nodes    06/23/2023 Pathology Results    Pathology results from the left breast needle core biopsy showed grade 3 invasive poorly differentiated ductal carcinoma, focal high-grade DCIS, cribriform type, negative for angiolymphatic invasion, negative for micro calcs, prognostic showed ER 0% negative, PR 0% negative, HER2 negative and Ki-67 of 95%.    06/30/2023 Initial Diagnosis    Malignant neoplasm of upper-outer quadrant of left breast in female, estrogen receptor negative (HCC)      Genetic Testing    [facility] Custom Panel+RNA was Negative. Of note, a variant of uncertain significance was detected in the MSH3 gene (c.1027+4T>C (Intronic)). Report date is 07/10/2023.   The Custom Hereditary Cancers Panel offered by [employer] includes sequencing and/or deletion duplication testing of the following 46 genes: APC, ATM, AXIN2, BAP1, BARD1, BMPR1A, BRCA1, BRCA2, BRIP1, CDH1, CDK4, CDKN2A (p14ARF and p16INK4a only), CHEK2, CTNNA1, DICER1, EPCAM (Deletion/duplication testing only), FH, GREM1 (promoter region duplication testing only), HOXB13, KIT, MBD4, MEN1, MLH1, MSH2, MSH3, MSH6, MUTYH, NF1, NHTL1, PALB2, PDGFRA, PMS2, POLD1, POLE, PRKAR1A, PTEN, RAD51C, RAD51D, RET, SMAD4, SMARCA4. STK11, TP53, TSC1, TSC2, and VHL.   ASSESSMENT AND PLAN:  Malignant neoplasm of upper-outer quadrant of left breast in female, estrogen receptor negative (HCC) This is a pleasant 72 year old postmenopausal female patient with newly diagnosed left breast T2 N0 triple negative invasive ductal carcinoma referred to breast [facility] for additional recommendations.  Given triple negative biology and size of the tumor over 2 cm, we have discussed about both neoadjuvant and adjuvant approach. I have discussed neoadjuvant chemotherapy options  including keynote 522 versus SCARLET trial versus adjuvant approach.  She has decent performance status but is mostly limited by her diarrhea predominant IBS as well as COPD given her prior history of smoking.  After much discussion we have discussed about proceeding with upfront surgery followed by consideration for adjuvant chemotherapy.  She is here for follow-up.  Since her last visit here she had lumpectomy and this showed a grade three 2.5 cm tumor, 0 out of 13 lymph nodes involved.  We have once again discussed about role of adjuvant chemo given the triple negative biology.  She understands that the benefit of adjuvant chemo is to reduce the risk of recurrence.  She does not however want to proceed with chemotherapy.  She says she is quite overwhelmed and would like to continue active surveillance after radiation.  With regards to the neck nodules as well as the lung nodule, we have discussed about proceeding with CT imaging to further investigate this.  She is a smoker hence at risk for lung primary.  I will try to call her in about a couple weeks to follow-up on the CT results.  She will otherwise return to clinic after completing adjuvant radiation.  Lymphedema issues, if any:  yes left, patient also reports cording to left arm.   Pain issues, if any:  yes, burning pain only comes on with lifting left arm.   SAFETY ISSUES: Prior radiation? no Pacemaker/ICD? no Possible current pregnancy?no Is the patient on methotrexate? no  Current Complaints / other details:  BP 113/64 (BP Location: Left Arm, Patient Position: Sitting)   Pulse 84   Temp (!) 97.3 F (36.3 C) (Temporal)   Resp 18   Ht 5' 9.5" (1.765 m)   Wt 220 lb 8 oz (100 kg)   SpO2 96%   BMI 32.10 kg/m

## 2023-09-11 ENCOUNTER — Encounter: Payer: Self-pay | Admitting: Internal Medicine

## 2023-09-11 ENCOUNTER — Ambulatory Visit (INDEPENDENT_AMBULATORY_CARE_PROVIDER_SITE_OTHER): Payer: Medicare Other | Admitting: Internal Medicine

## 2023-09-11 DIAGNOSIS — J4489 Other specified chronic obstructive pulmonary disease: Secondary | ICD-10-CM

## 2023-09-11 DIAGNOSIS — J439 Emphysema, unspecified: Secondary | ICD-10-CM

## 2023-09-11 DIAGNOSIS — R918 Other nonspecific abnormal finding of lung field: Secondary | ICD-10-CM | POA: Diagnosis not present

## 2023-09-11 DIAGNOSIS — J301 Allergic rhinitis due to pollen: Secondary | ICD-10-CM | POA: Diagnosis not present

## 2023-09-11 MED ORDER — CETIRIZINE HCL 10 MG PO TABS: 10.0000 mg | ORAL_TABLET | Freq: Every day | ORAL | 11 refills | Status: DC

## 2023-09-11 NOTE — Patient Instructions (Signed)
It was a pleasure to see you today!  Please schedule follow up scheduled with myself in 6 months.  If my schedule is not open yet, we will contact you with a reminder closer to that time. Please call [phone] if you haven't heard from [place] a month before, and always call us sooner if issues or concerns arise. You can also send [place] a message through MyChart, but but aware that this is not to be used for urgent issues and it may take up to 5-7 days to receive a reply. Please be aware that you will likely be able to view your results before I have a chance to respond to them. Please give [place] 5 business days to respond to any non-urgent results.   Before your next visit I would like you to have: CT Chest - it is scheduled for 9/17.   Ok to hold off on inhaler therapy. If you start using albuterol more frequently let me know and we can add back maintenance therapy.    We will see what the nodules look like on ct scan. I am hopeful they are infection or inflammation and not cancer.   I have sent cetirizine to your pharmacy for allergies.

## 2023-09-11 NOTE — Progress Notes (Signed)
[name] [name]    [id number]    July 29, 1951  Primary Care Physician:Pharr, Zollie Beckers, MD Date of Appointment: 09/11/2023 Established Patient Visit  Chief complaint:   Chief Complaint  Patient presents with   Follow-up    COPD F/U. No SOB. No Coughing.    HPI: [name] [name] is a 72 y.o. woman with history of tobacco use(quit 2013), copd, and seasonal allergic rhinitis.  Interval Updates: Here for COPD follow up. Unfortunately diagnosed with breast cancer and left lumpectomy and lymph node dissection. Now has lymphedema. Going to start physical therapy and radiation soon.  Had CT Abdomen which showed   Allergies starting to kick in. Needs cetirizine refill.   Current therapy: not on any inhalers. Doesn't feel like she needs them. Not needed any prednisone for her breathing.    I have reviewed the patient's family social and past medical history and updated as appropriate.   Past Medical History:  Diagnosis Date   Adenomatous colon polyp    Allergy    Anxiety    Arthritis    bilateral hands. lower back   Cancer [facility])    skin   Cataract    bilateral   COPD (chronic obstructive pulmonary disease) (HCC)    Depression    Diverticulosis    Endometriosis    GERD (gastroesophageal reflux disease)    Glaucoma    Headache    Migraines   Hiatal hernia    History of left breast cancer 05/2023   IBS (irritable bowel syndrome)    Inguinal hernia    Pneumothorax 1987   PONV (postoperative nausea and vomiting)     Past Surgical History:  Procedure Laterality Date   ABDOMINAL HYSTERECTOMY  1992   ANTERIOR CERVICAL DECOMP/DISCECTOMY FUSION N/A 09/23/2022   Procedure: Anterior Decompression Fusion,PLATE/SCREWS Cervical five-six; REMOVAL OLD PLATE;  Surgeon: Tressie Stalker, MD;  Location: [facility] OR;  Service: Neurosurgery;  Laterality: N/A;   APPENDECTOMY  1992   BACK SURGERY     cervical   BREAST BIOPSY Left 06/23/2023   [place] LT BREAST BX W LOC DEV 1ST LESION IMG BX  SPEC US GUIDE 06/23/2023 [facility]   BREAST BIOPSY Left 07/28/2023   [place] LT RADIOACTIVE SEED LOC 07/28/2023 [facility] M[facility]MO[facility]   BREAST LUMPECTOMY WITH RADIOACTIVE SEED AND SENTINEL LYMPH NODE BIOPSY Left 07/29/2023   Procedure: LEFT BREAST SEED LUMPECTOMY WITH LEFT SENTINEL LYMPH NODE MAPPING;  Surgeon: Harriette Bouillon, MD;  Location: [facility] OR;  Service: General;  Laterality: Left;  PEC BLOCK   CATARACT EXTRACTION Bilateral    2021, 2022   COLONOSCOPY  2023   PLEURAL SCARIFICATION     ruptured disk     torn tendon     right arm   TUBAL LIGATION      Family History  Problem Relation Age of Onset   Heart failure Mother    Crohn's disease Sister    Osteoporosis Sister    Neuropathy Sister    Colon polyps Sister    Lung cancer Sister 97       smoked   Thyroid disease Sister    Thyroid cancer Maternal Grandmother    Colon cancer Neg Hx    Breast cancer Neg Hx    Esophageal cancer Neg Hx    Pancreatic cancer Neg Hx    Stomach cancer Neg Hx     Social History   Occupational History   Occupation: locksmith  Tobacco Use   Smoking status:  Former    Current packs/day: 0.00    Average packs/day: 1.5 packs/day for 40.1 years (60.2 ttl pk-yrs)    Types: Cigarettes    Start date: 12/31/1971    Quit date: 02/10/2012    Years since quitting: 11.5   Smokeless tobacco: Never  Vaping Use   Vaping status: Never Used  Substance and Sexual Activity   Alcohol use: No   Drug use: No   Sexual activity: Never     Physical Exam: Blood pressure 136/80, pulse 64, height 5' 9.5" (1.765 m), weight 217 lb 12.8 oz (98.8 kg), SpO2 94%.  Gen:     NAD ENT: nares patent, no polyps Lungs:   ctab no wheezes or crackles CV:         RRR, no mrg    Data Reviewed: Imaging: I have personally reviewed the CT Chest from Oct 2022 with stable 6mm pulmonary nodule.   PFTs: None on file.  Labs: Lab Results  Component Value Date   WBC 7.4 09/08/2023   HGB 14.7 09/08/2023   HCT 44.9 09/08/2023    MCV 95.1 09/08/2023   PLT 240 09/08/2023   Lab Results  Component Value Date   NA 141 09/08/2023   K 4.9 09/08/2023   CL 108 09/08/2023   CO2 28 09/08/2023    Immunization status: Immunization History  Administered Date(s) Administered   Influenza, High Dose Seasonal PF 08/28/2016, 10/16/2018, 09/16/2019, 09/18/2020   Influenza, Quadrivalent, Recombinant, Inj, Pf 10/10/2017, 10/16/2018, 09/15/2020, 10/02/2021   Influenza, Seasonal, Injecte, Preservative Fre 09/13/2014   Influenza-Unspecified 09/22/2012, 09/29/2018, 09/15/2020, 10/02/2021, 09/11/2022   PFIZER(Purple Top)SARS-COV-2 Vaccination 01/21/2020, 02/11/2020   Pneumococcal Conjugate-13 03/20/2017   Pneumococcal Polysaccharide-23 03/30/2018   Td 12/26/2014   Zoster Recombinant(Shingrix) 09/22/2019   Zoster, Live 03/01/2015    Assessment:  COPD moderate FEV1 65% of predicted, controlled Chronic Cough - seasonal allergic rhinitis, not controlled GERD controlled Need for lung cancer screening Multiple pulmonary nodules  Plan/Recommendations: CT Chest - it is scheduled for 9/17.   Ok to hold off on inhaler therapy. If you start using albuterol more frequently let me know and we can add back maintenance therapy.   We will see what the nodules look like on ct scan. I am hopeful they are infection or inflammation and not cancer.  She is a candidate for LDCT for lung cancer screening - will get her back on screening schedule through 2028 after this scan.  I have sent cetirizine to your pharmacy for allergies.   Return to Care: Return in about 6 months (around 03/10/2024).   Durel Salts, MD Pulmonary and Critical Care Medicine [facility] [phone]

## 2023-09-14 NOTE — Progress Notes (Signed)
Radiation Oncology         [phone] ________________________________  Name: [name] [name] MRN: [id number]  Date: 09/15/2023  DOB: 24-Aug-1951  Re-Evaluation Note  CC: Merri Brunette, MD  Rachel Moulds, MD  No diagnosis found.  Diagnosis: Left Breast UOQ, Invasive and in situ ductal carcinoma, ER- / PR- / Her2-, Grade 3: s/p left breast lumpectomy and left axillary nodal excisions (clear margins and negative nodes)   Cancer Staging  Malignant neoplasm of upper-outer quadrant of left breast in female, estrogen receptor negative (HCC) Staging form: Breast, AJCC 8th Edition - Clinical stage from 07/02/2023: Stage IIB (cT2, cN0, cM0, G3, ER-, PR-, HER2-) - Unsigned  Narrative:  The patient returns today to discuss radiation treatment options. She was seen in the multidisciplinary breast clinic on 07/02/23.   She underwent genetic testing on her consultation date. Results showed no clinically significant variants detected by [facility] genetic testing. However, a variant of uncertain significance was detected in the MSH3 gene.   Since her consultation date, she opted to proceed with a left breast lumpectomy with left axillary nodal evaluation on 07/29/23 under the care of Dr. Luisa Hart. Pathology from the procedure revealed: tumor the size of 2.5 cm; histology of grade 3 invasive ductal carcinoma with extensive necrosis, and high-grade DCIS; all margins negative for invasive and in situ carcinoma; nodal status of 13/13 excised left axillary lymph nodes negative for carcinoma. Prognostic indicators significant for: estrogen receptor negative; progesterone receptor negative; Proliferation marker Ki67 at 95%; Her2 status negative; Grade 3.   Post-operatively, the patient presented to general surgery on 08/04/23 with c/o swelling and pain to the left breast. She also voiced concerns that her drain was not functioning well, however the drain appeared to be functioning properly. Her pain and swelling  have since improved, and her drain was removed on 08/11/23. She now has some cording which she is seeing PT for.   In the setting of her triple negative histology, Dr. Al Pimple recommended that she consider adjuvant chemotherapy. The patient has however declined adjuvant chemotherapy and has opted to proceed with XRT followed by active surveillance.   The patient also presented to GI last month with c/o abdominal pain, diarrhea, and nausea. She subsequently presented for a CT AP with contrast on 08/25/23 which demonstrated a new clustered nodularity in the RML measuring up to 8 mm favoring an infectious vs inflammatory etiology. CT also showed a 2 mm distal right ureteral calculus located just above the UVJ.   In light of CT findings, the patient was referred to pulmonology and recently met with Dr. Celine Mans and [facility] on 09/11/23. Dr. Celine Mans has ordered further work-up consisting of a chest CT and soft tissue neck CT for further evaluation of the above findings (both are scheduled for 09/16/23).   On review of systems, the patient reports ***. She denies *** and any other symptoms.    Allergies:  is allergic to bupropion, cefaclor, elemental sulfur, sulfa antibiotics, latex, and ofloxacin.  Meds: Current Outpatient Medications  Medication Sig Dispense Refill   albuterol (VENTOLIN HFA) 108 (90 Base) MCG/ACT inhaler Inhale 2 puffs into the lungs every 6 (six) hours as needed. 18 g 5   ALPRAZolam (XANAX) 0.25 MG tablet Take 0.25 mg by mouth 3 (three) times daily as needed for anxiety.     aspirin EC 81 MG tablet Take 81 mg by mouth in the morning. Swallow whole.     Bacillus Coagulans-Inulin (PROBIOTIC) 1-250 BILLION-MG CAPS Take 1 capsule  by mouth every evening.     cetirizine (ZYRTEC) 10 MG tablet Take 1 tablet (10 mg total) by mouth daily. 30 tablet 11   Cholecalciferol (VITAMIN D PO) Take 5,000 Units by mouth in the morning.     denosumab (PROLIA) 60 MG/ML SOSY injection Inject 60 mg into  the skin every 6 (six) months.     famotidine (PEPCID) 20 MG tablet Take 1 tablet (20 mg total) by mouth 2 (two) times daily. As needed (Patient taking differently: Take 20 mg by mouth daily as needed for heartburn or indigestion.) 60 tablet 6   fluticasone (FLONASE) 50 MCG/ACT nasal spray Place into both nostrils.     inclisiran (LEQVIO) 284 MG/1.5ML SOSY injection Inject 284 mg into the skin every 6 (six) months. Every three months     loperamide (IMODIUM) 2 MG capsule Take 2 mg by mouth as needed for diarrhea or loose stools.     MELATONIN-CHAMOMILE PO Take 1 tablet by mouth at bedtime.     promethazine (PHENERGAN) 12.5 MG suppository Place 12.5 mg rectally every 6 (six) hours as needed for nausea or vomiting.     sertraline (ZOLOFT) 100 MG tablet Take 50 mg by mouth in the morning.     No current facility-administered medications for this encounter.    Physical Findings: The patient is in no acute distress. Patient is alert and oriented.  vitals were not taken for this visit.  No significant changes. Lungs are clear to auscultation bilaterally. Heart has regular rate and rhythm. No palpable cervical, supraclavicular, or axillary adenopathy. Abdomen soft, non-tender, normal bowel sounds. Right Breast: no palpable mass, nipple discharge or bleeding. Left Breast: ***  Lab Findings: Lab Results  Component Value Date   WBC 7.4 09/08/2023   HGB 14.7 09/08/2023   HCT 44.9 09/08/2023   MCV 95.1 09/08/2023   PLT 240 09/08/2023    Radiographic Findings: CT ABDOMEN PELVIS W CONTRAST  Result Date: 08/30/2023 CLINICAL DATA:  Lower abdominal pain, nausea, diarrhea. Left breast cancer. EXAM: CT ABDOMEN AND PELVIS WITH CONTRAST TECHNIQUE: Multidetector CT imaging of the abdomen and pelvis was performed using the standard protocol following bolus administration of intravenous contrast. RADIATION DOSE REDUCTION: This exam was performed according to the departmental dose-optimization program which  includes automated exposure control, adjustment of the mA and/or kV according to patient size and/or use of iterative reconstruction technique. CONTRAST:  75mL OMNIPAQUE IOHEXOL 300 MG/ML  SOLN COMPARISON:  CT abdomen/pelvis dated 07/07/2013. Partial comparison CT chest dated 11/05/2021. FINDINGS: Lower chest: Clustered nodularity in the right middle lobe measuring up to 8 mm (series 8/image 5), new from prior CT, favoring infection/inflammation. Mild dependent atelectasis in the bilateral lower lobes. Hepatobiliary: Liver is within normal limits. Gallbladder is unremarkable. No intrahepatic or extrahepatic ductal dilatation. Pancreas: Within normal limits. Spleen: Within normal limits. Adrenals/Urinary Tract: Adrenal glands are within normal limits. Multiple left renal cysts, including a dominant 18 mm simple cyst in the lateral interpolar left kidney (series 2/image 30), benign (Bosniak I). No follow-up is recommended. Right kidney is within normal limits.  No hydronephrosis. 2 mm distal right ureteral calculus just above the UVJ (series 2/image 76). Bladder is underdistended but unremarkable. Stomach/Bowel: Stomach is notable for a moderate hiatal hernia. No evidence of bowel obstruction. Appendix is not discretely visualized. Sigmoid diverticulosis, without evidence of diverticulitis. Vascular/Lymphatic: No evidence of abdominal aortic aneurysm. Atherosclerotic calcifications of the abdominal aorta and branch vessels, although vessels remain patent. No suspicious abdominopelvic lymphadenopathy. Reproductive: Status post hysterectomy.  No adnexal masses. Other: No abdominopelvic ascites. Musculoskeletal: Mild degenerative changes of the visualized thoracolumbar spine. IMPRESSION: 2 mm distal right ureteral calculus just above the UVJ. No hydronephrosis. Clustered nodularity in the right middle lobe measuring up to 8 mm, new from prior CT, favoring infection/inflammation. Consider dedicated CT chest for further  evaluation as clinically warranted. Additional ancillary findings as above. Electronically Signed   By: Charline Bills M.D.   On: 08/30/2023 00:01    Impression: Left Breast UOQ, Invasive and in situ ductal carcinoma, ER- / PR- / Her2-, Grade 3: s/p left breast lumpectomy and left axillary nodal excisions (clear margins and negative nodes)  ***  Plan:  Patient is scheduled for CT simulation {date/later today}. ***  -----------------------------------  Billie Lade, PhD, MD  This document serves as a record of services personally performed by Antony Blackbird, MD. It was created on his behalf by Neena Rhymes, a trained medical scribe. The creation of this record is based on the scribe's personal observations and the provider's statements to them. This document has been checked and approved by the attending provider.

## 2023-09-15 ENCOUNTER — Ambulatory Visit
Admission: RE | Admit: 2023-09-15 | Discharge: 2023-09-15 | Disposition: A | Discharge status: Home / Self Care | Payer: Medicare Other | Source: Ambulatory Visit | Attending: Radiation Oncology | Admitting: Radiation Oncology

## 2023-09-15 ENCOUNTER — Encounter: Payer: Self-pay | Admitting: Radiation Oncology

## 2023-09-15 ENCOUNTER — Encounter: Payer: Self-pay | Admitting: *Deleted

## 2023-09-15 VITALS — BP 113/64 | HR 84 | Temp 97.3°F | Resp 18 | Ht 69.5 in | Wt 220.5 lb

## 2023-09-15 DIAGNOSIS — Z510 Encounter for antineoplastic radiation therapy: Secondary | ICD-10-CM | POA: Insufficient documentation

## 2023-09-15 DIAGNOSIS — C50412 Malignant neoplasm of upper-outer quadrant of left female breast: Secondary | ICD-10-CM | POA: Diagnosis not present

## 2023-09-15 DIAGNOSIS — Z171 Estrogen receptor negative status [ER-]: Secondary | ICD-10-CM

## 2023-09-15 DIAGNOSIS — K449 Diaphragmatic hernia without obstruction or gangrene: Secondary | ICD-10-CM | POA: Diagnosis not present

## 2023-09-15 DIAGNOSIS — R918 Other nonspecific abnormal finding of lung field: Secondary | ICD-10-CM | POA: Insufficient documentation

## 2023-09-15 DIAGNOSIS — I890 Lymphedema, not elsewhere classified: Secondary | ICD-10-CM | POA: Insufficient documentation

## 2023-09-15 DIAGNOSIS — I700 Atherosclerosis of aorta: Secondary | ICD-10-CM | POA: Diagnosis not present

## 2023-09-15 DIAGNOSIS — N281 Cyst of kidney, acquired: Secondary | ICD-10-CM | POA: Diagnosis not present

## 2023-09-15 DIAGNOSIS — R110 Nausea: Secondary | ICD-10-CM | POA: Diagnosis not present

## 2023-09-15 DIAGNOSIS — N201 Calculus of ureter: Secondary | ICD-10-CM | POA: Insufficient documentation

## 2023-09-15 DIAGNOSIS — Z170 Estrogen receptor positive status [ER+]: Secondary | ICD-10-CM | POA: Insufficient documentation

## 2023-09-15 DIAGNOSIS — Z79899 Other long term (current) drug therapy: Secondary | ICD-10-CM | POA: Diagnosis not present

## 2023-09-15 DIAGNOSIS — K5730 Diverticulosis of large intestine without perforation or abscess without bleeding: Secondary | ICD-10-CM | POA: Insufficient documentation

## 2023-09-16 ENCOUNTER — Ambulatory Visit: Payer: Medicare Other | Admitting: Physical Therapy

## 2023-09-16 ENCOUNTER — Ambulatory Visit (HOSPITAL_COMMUNITY)
Admission: RE | Admit: 2023-09-16 | Discharge: 2023-09-16 | Disposition: A | Discharge status: Home / Self Care | Payer: Medicare Other | Source: Ambulatory Visit | Attending: Hematology and Oncology | Admitting: Hematology and Oncology

## 2023-09-16 ENCOUNTER — Encounter: Payer: Self-pay | Admitting: Physical Therapy

## 2023-09-16 DIAGNOSIS — K449 Diaphragmatic hernia without obstruction or gangrene: Secondary | ICD-10-CM | POA: Diagnosis not present

## 2023-09-16 DIAGNOSIS — C50412 Malignant neoplasm of upper-outer quadrant of left female breast: Secondary | ICD-10-CM | POA: Diagnosis not present

## 2023-09-16 DIAGNOSIS — E041 Nontoxic single thyroid nodule: Secondary | ICD-10-CM | POA: Diagnosis not present

## 2023-09-16 DIAGNOSIS — R293 Abnormal posture: Secondary | ICD-10-CM

## 2023-09-16 DIAGNOSIS — M25612 Stiffness of left shoulder, not elsewhere classified: Secondary | ICD-10-CM

## 2023-09-16 DIAGNOSIS — Z171 Estrogen receptor negative status [ER-]: Secondary | ICD-10-CM

## 2023-09-16 DIAGNOSIS — Z981 Arthrodesis status: Secondary | ICD-10-CM | POA: Diagnosis not present

## 2023-09-16 DIAGNOSIS — I890 Lymphedema, not elsewhere classified: Secondary | ICD-10-CM | POA: Diagnosis not present

## 2023-09-16 DIAGNOSIS — R911 Solitary pulmonary nodule: Secondary | ICD-10-CM | POA: Diagnosis not present

## 2023-09-16 DIAGNOSIS — I6523 Occlusion and stenosis of bilateral carotid arteries: Secondary | ICD-10-CM | POA: Diagnosis not present

## 2023-09-16 DIAGNOSIS — Z483 Aftercare following surgery for neoplasm: Secondary | ICD-10-CM

## 2023-09-16 DIAGNOSIS — I700 Atherosclerosis of aorta: Secondary | ICD-10-CM | POA: Diagnosis not present

## 2023-09-16 DIAGNOSIS — R221 Localized swelling, mass and lump, neck: Secondary | ICD-10-CM | POA: Diagnosis not present

## 2023-09-16 MED ORDER — IOHEXOL 300 MG/ML  SOLN
75.0000 mL | Freq: Once | INTRAMUSCULAR | Status: AC | PRN
  Administered 2023-09-16: 75 mL via INTRAVENOUS

## 2023-09-16 NOTE — Therapy (Signed)
OUTPATIENT PHYSICAL THERAPY  UPPER EXTREMITY ONCOLOGY TREATMENT  Patient Name: [name] [name] MRN: [id number] DOB:December 01, 1951, 72 y.o., female Today's Date: 09/16/2023  END OF SESSION:  PT End of Session - 09/16/23 1005     Visit Number 2    Number of Visits 9    Date for PT Re-Evaluation 10/07/23    PT Start Time 1004    PT Stop Time 1057    PT Time Calculation (min) 53 min    Activity Tolerance Patient tolerated treatment well    Behavior During Therapy WFL for tasks assessed/performed             Past Medical History:  Diagnosis Date   Adenomatous colon polyp    Allergy    Anxiety    Arthritis    bilateral hands. lower back   Cancer [facility])    skin   Cataract    bilateral   COPD (chronic obstructive pulmonary disease) (HCC)    Depression    Diverticulosis    Endometriosis    GERD (gastroesophageal reflux disease)    Glaucoma    Headache    Migraines   Hiatal hernia    History of left breast cancer 05/2023   IBS (irritable bowel syndrome)    Inguinal hernia    Pneumothorax 1987   PONV (postoperative nausea and vomiting)    Past Surgical History:  Procedure Laterality Date   ABDOMINAL HYSTERECTOMY  1992   ANTERIOR CERVICAL DECOMP/DISCECTOMY FUSION N/A 09/23/2022   Procedure: Anterior Decompression Fusion,PLATE/SCREWS Cervical five-six; REMOVAL OLD PLATE;  Surgeon: Tressie Stalker, MD;  Location: [facility] OR;  Service: Neurosurgery;  Laterality: N/A;   APPENDECTOMY  1992   BACK SURGERY     cervical   BREAST BIOPSY Left 06/23/2023   [place] LT BREAST BX W LOC DEV 1ST LESION IMG BX SPEC US GUIDE 06/23/2023 [facility]   BREAST BIOPSY Left 07/28/2023   [place] LT RADIOACTIVE SEED LOC 07/28/2023 [facility]   BREAST LUMPECTOMY WITH RADIOACTIVE SEED AND SENTINEL LYMPH NODE BIOPSY Left 07/29/2023   Procedure: LEFT BREAST SEED LUMPECTOMY WITH LEFT SENTINEL LYMPH NODE MAPPING;  Surgeon: Harriette Bouillon, MD;  Location: [facility] OR;  Service: General;  Laterality: Left;  PEC  BLOCK   CATARACT EXTRACTION Bilateral    2021, 2022   COLONOSCOPY  2023   PLEURAL SCARIFICATION     ruptured disk     torn tendon     right arm   TUBAL LIGATION     Patient Active Problem List   Diagnosis Date Noted   Genetic testing 07/11/2023   Malignant neoplasm of upper-outer quadrant of left breast in female, estrogen receptor negative (HCC) 06/30/2023   Cervical spondylosis with radiculopathy 09/23/2022   Elevated low-density lipoprotein level 06/14/2022   Coronary arteriosclerosis 06/14/2022   Adverse reaction to antihyperlipidemic drug, initial encounter 06/14/2022   Diarrhea 01/27/2019   Change in bowel habits 01/05/2019   Left inguinal hernia 06/27/2014   Bloating 05/17/2013   Unspecified constipation 05/17/2013   Pneumothorax 10/17/2008   HIATAL HERNIA 10/17/2008   DIVERTICULOSIS, COLON 10/17/2008   ENDOMETRIOSIS 10/17/2008   Right lower quadrant abdominal pain 10/17/2008   DEPRESSION, HX OF 10/17/2008   COLONIC POLYPS, ADENOMATOUS, HX OF 10/17/2008   PNEUMOTHORAX 10/17/2008    PCP: Merri Brunette, MD   REFERRING PROVIDER: Rachel Moulds, MD  REFERRING DIAG: C50.412,Z17.1 (ICD-10-CM) - Malignant neoplasm of upper-outer quadrant of left breast in female, estrogen receptor negative (HCC) , L axillary cording  THERAPY DIAG:  Stiffness  of left shoulder, not elsewhere classified  Aftercare following surgery for neoplasm  Lymphedema, not elsewhere classified  Abnormal posture  Malignant neoplasm of upper-outer quadrant of left breast in female, estrogen receptor negative (HCC)  ONSET DATE: 09/01/23  Rationale for Evaluation and Treatment: Rehabilitation  SUBJECTIVE:                                                                                                                                                                                           SUBJECTIVE STATEMENT: I have been wearing the sleeve but it is causing more problems. I think it is too short and  my hand is swelling so I can only wear it 10 hours at a time.   PERTINENT HISTORY: Patient was diagnosed on 08/15/2023 with left grade 3 invasive ductal carcinoma breast cancer. She underwent a left lumpectomy and sentinel node biopsy (13 negative nodes) on 07/29/2023. It is triple negative with a Ki67 of 95%. She has a history of a cervical fusion in 08/2022. Plans to start radiation soon.   PAIN:  Are you having pain? No  PRECAUTIONS: Other: L UE lymphedema risk, cervical fusion C6, C7  RED FLAGS: Compression fracture: Yes: T12    WEIGHT BEARING RESTRICTIONS: No  FALLS:  Has patient fallen in last 6 months? No  LIVING ENVIRONMENT: Lives with: lives with their spouse Lives in: House/apartment Stairs: Yes; External: 5 steps; can reach both Has following equipment at home: None  OCCUPATION: locksmith  LEISURE: was walking until last week but recently stopped due to stomach issues  HAND DOMINANCE: right   PRIOR LEVEL OF FUNCTION: Independent  PATIENT GOALS: to get my arm back to normal   OBJECTIVE:  COGNITION: Overall cognitive status: Within functional limits for tasks assessed   PALPATION: Cording palpable in L axilla, increased edema noticeable in LUE and L breast  OBSERVATIONS / OTHER ASSESSMENTS: L breast more full than R especially at lateral breast  POSTURE: forward head and rounded shoulders  UPPER EXTREMITY AROM/PROM:  A/PROM RIGHT   eval   Shoulder extension 70  Shoulder flexion 167  Shoulder abduction 166  Shoulder internal rotation 65  Shoulder external rotation 78    (Blank rows = not tested)  A/PROM LEFT   eval  Shoulder extension 65  Shoulder flexion 155  Shoulder abduction 160  Shoulder internal rotation 55  Shoulder external rotation 83    (Blank rows = not tested)    UPPER EXTREMITY STRENGTH:   LYMPHEDEMA ASSESSMENTS:   SURGERY TYPE/DATE: 07/29/23 L lumpectomy and SLNB  NUMBER OF LYMPH NODES REMOVED: 13  CHEMOTHERAPY: pt declined  chemo  RADIATION: plans  to begin radiation soon  HORMONE TREATMENT: none  INFECTIONS: none   LYMPHEDEMA ASSESSMENTS:   LANDMARK RIGHT  eval  At axilla  35.5  15 cm proximal to olecranon process 30.8  10 cm proximal to olecranon process 30  Olecranon process 26.5  15 cm proximal to ulnar styloid process 23.9  10 cm proximal to ulnar styloid process 20.4  Just proximal to ulnar styloid process 17.4  Across hand at thumb web space 19.5  At base of 2nd digit 6.5  (Blank rows = not tested)  LANDMARK LEFT  eval  At axilla  35.9  15 cm proximal to olecranon process 32.5  10 cm proximal to olecranon process 31.5  Olecranon process 28  15 cm proximal to ulnar styloid process 24.5  10 cm proximal to ulnar styloid process 20.6  Just proximal to ulnar styloid process 18.1  Across hand at thumb web space 20.6  At base of 2nd digit 6.4  (Blank rows = not tested)    QUICK DASH SURVEY:       The patient was assessed using the L-Dex machine today to produce a lymphedema index baseline score. The patient will be reassessed on a regular basis (typically every 3 months) to obtain new L-Dex scores. If the score is > 6.5 points away from his/her baseline score indicating onset of subclinical lymphedema, it will be recommended to wear a compression garment for 4 weeks, 12 hours per day and then be reassessed. If the score continues to be > 6.5 points from baseline at reassessment, we will initiate lymphedema treatment. Assessing in this manner has a 95% rate of preventing clinically significant lymphedema.   TODAY'S TREATMENT:                                                                                                                                          DATE: 09/16/23: Measured pt for a new compression sleeve since the one given last session is too short and she is having swelling in her hand. She fit in to an off the shelf Sigvaris Secure size M2 (long) and medium glove - issued  info for pt to obtain this online Pulleys x 2 min in direction of flexion and 2 min in direction of abduction MFR throughout LUE to help decrease cording with numerous cords palpable and a large thick cord palpable in L axilla Began Manual lymph drainage in supine as follows: short neck, right axillary nodes, left inguinal nodes, superficial and deep abdominals; anterior inter-axillary anastamoses, left axillo-inguinal anastamoses. Left upper extremity from fingers and dorsal hand to lateral shoulder redirecting along pathways. Instructed pt throughout in sequence, skin stretch and anatomy and physiology of the lymphatic system.    09/09/23: Pulleys x 2 min in direction of flexion and 2 min in direction of abduction with pt returning therapist demo and feeling less discomfort at end of pulleys Measured  pt for compression sleeve after increased Ldex score: Medi Harmony Size 3 EW and issued this to her    PATIENT EDUCATION:  Education details: importance of stretching to decrease discomfort with cording, purchase over the door pulleys for AAROM at home, wear compression sleeve for 12 hours a day for 4 weeks, how to don/doff garments Person educated: Patient Education method: Medical illustrator Education comprehension: verbalized understanding and returned demonstration  HOME EXERCISE PROGRAM: Continue post op exercises Over the door pulleys Wear compression sleeve 12 hours a day for 4 weeks  ASSESSMENT:  CLINICAL IMPRESSION: Remeasured pt for a new compression sleeve and glove today since the sleeve that was issued at last session was too short and caused edema to be pushed in to her hand. Issued information for pt to obtain online. Began MLD and MFR to cording today and instructed pt throughout. Will begin MLD to L breast at next session and begin instructing pt in self MLD.    OBJECTIVE IMPAIRMENTS: decreased knowledge of condition, decreased knowledge of use of DME, decreased  ROM, increased edema, increased fascial restrictions, impaired UE functional use, postural dysfunction, and pain.   ACTIVITY LIMITATIONS: carrying, lifting, and reach over head  PARTICIPATION LIMITATIONS: meal prep, cleaning, laundry, and community activity  PERSONAL FACTORS:  none  are also affecting patient's functional outcome.   REHAB POTENTIAL: Good  CLINICAL DECISION MAKING: Stable/uncomplicated  EVALUATION COMPLEXITY: Low  GOALS: Goals reviewed with patient? Yes  SHORT TERM GOALS=LONG TERM GOALS Target date: 10/07/23  Pt will be independent with self MLD for L UE and L breast for long term management of lymphedema. Baseline: Goal status: INITIAL  2.  Pt will return to the green on SOZO demonstrating reversal of subclinical lymphedema. Baseline:  Goal status: INITIAL  3.  Pt will demonstrate 165 degrees of L shoulder flexion to allow her to reach overhead.  Baseline:  Goal status: INITIAL  4.  Pt will be independent in a home exercise program for continued stretching and strengthening.  Baseline:  Goal status: INITIAL  5.  Pt will report a 75% improvement in pain and discomfort from axillary cording to allow improved comfort.  Baseline:  Goal status: INITIAL  6.  Pt will obtain appropriate compression garments - sleeve and bra for long term management.  Baseline:  Goal status: INITIAL  PLAN:  PT FREQUENCY: 2x/week  PT DURATION: 4 weeks  PLANNED INTERVENTIONS: Therapeutic exercises, Therapeutic activity, Patient/Family education, Self Care, Joint mobilization, Orthotic/Fit training, Manual lymph drainage, Compression bandaging, scar mobilization, Taping, Vasopneumatic device, Manual therapy, and Re-evaluation  PLAN FOR NEXT SESSION: cont MLD to L breast and LUE and instruct pt, MFR to cording in L axilla, did she get new sleeve?  [facility] [place], PT 09/16/2023, 1:10 PM

## 2023-09-18 ENCOUNTER — Ambulatory Visit: Payer: Medicare Other

## 2023-09-18 DIAGNOSIS — R293 Abnormal posture: Secondary | ICD-10-CM

## 2023-09-18 DIAGNOSIS — I890 Lymphedema, not elsewhere classified: Secondary | ICD-10-CM | POA: Diagnosis not present

## 2023-09-18 DIAGNOSIS — C50412 Malignant neoplasm of upper-outer quadrant of left female breast: Secondary | ICD-10-CM

## 2023-09-18 DIAGNOSIS — M25612 Stiffness of left shoulder, not elsewhere classified: Secondary | ICD-10-CM

## 2023-09-18 DIAGNOSIS — Z483 Aftercare following surgery for neoplasm: Secondary | ICD-10-CM | POA: Diagnosis not present

## 2023-09-18 DIAGNOSIS — Z171 Estrogen receptor negative status [ER-]: Secondary | ICD-10-CM | POA: Diagnosis not present

## 2023-09-18 NOTE — Therapy (Signed)
OUTPATIENT PHYSICAL THERAPY  UPPER EXTREMITY ONCOLOGY TREATMENT  Patient Name: [name] [name] MRN: [id number] DOB:[birth year], 72 y.o., female Today's Date: 09/18/2023  END OF SESSION:  PT End of Session - 09/18/23 0907     Visit Number 3    Number of Visits 9    Date for PT Re-Evaluation 10/07/23    PT Start Time 0904    PT Stop Time 1000    PT Time Calculation (min) 56 min    Activity Tolerance Patient tolerated treatment well    Behavior During Therapy [facility] for tasks assessed/performed             Past Medical History:  Diagnosis Date   Adenomatous colon polyp    Allergy    Anxiety    Arthritis    bilateral hands. lower back   Cancer [facility])    skin   Cataract    bilateral   COPD (chronic obstructive pulmonary disease) (HCC)    Depression    Diverticulosis    Endometriosis    GERD (gastroesophageal reflux disease)    Glaucoma    Headache    Migraines   Hiatal hernia    History of left breast cancer 05/2023   IBS (irritable bowel syndrome)    Inguinal hernia    Pneumothorax 1987   PONV (postoperative nausea and vomiting)    Past Surgical History:  Procedure Laterality Date   ABDOMINAL HYSTERECTOMY  1992   ANTERIOR CERVICAL DECOMP/DISCECTOMY FUSION N/A 09/23/2022   Procedure: Anterior Decompression Fusion,PLATE/SCREWS Cervical five-six; REMOVAL OLD PLATE;  Surgeon: Tressie Stalker, MD;  Location: [facility] OR;  Service: Neurosurgery;  Laterality: N/A;   APPENDECTOMY  1992   BACK SURGERY     cervical   BREAST BIOPSY Left 06/23/2023   [place] LT BREAST BX W LOC DEV 1ST LESION IMG BX SPEC US GUIDE 06/23/2023 [facility]   BREAST BIOPSY Left 07/28/2023   [place] LT RADIOACTIVE SEED LOC 07/28/2023 [facility]   BREAST LUMPECTOMY WITH RADIOACTIVE SEED AND SENTINEL LYMPH NODE BIOPSY Left 07/29/2023   Procedure: LEFT BREAST SEED LUMPECTOMY WITH LEFT SENTINEL LYMPH NODE MAPPING;  Surgeon: Harriette Bouillon, MD;  Location: [facility] OR;  Service: General;  Laterality: Left;  PEC  BLOCK   CATARACT EXTRACTION Bilateral    2021, 2022   COLONOSCOPY  2023   PLEURAL SCARIFICATION     ruptured disk     torn tendon     right arm   TUBAL LIGATION     Patient Active Problem List   Diagnosis Date Noted   Genetic testing 07/11/2023   Malignant neoplasm of upper-outer quadrant of left breast in female, estrogen receptor negative (HCC) 06/30/2023   Cervical spondylosis with radiculopathy 09/23/2022   Elevated low-density lipoprotein level 06/14/2022   Coronary arteriosclerosis 06/14/2022   Adverse reaction to antihyperlipidemic drug, initial encounter 06/14/2022   Diarrhea 01/27/2019   Change in bowel habits 01/05/2019   Left inguinal hernia 06/27/2014   Bloating 05/17/2013   Unspecified constipation 05/17/2013   Pneumothorax 10/17/2008   HIATAL HERNIA 10/17/2008   DIVERTICULOSIS, COLON 10/17/2008   ENDOMETRIOSIS 10/17/2008   Right lower quadrant abdominal pain 10/17/2008   DEPRESSION, HX OF 10/17/2008   COLONIC POLYPS, ADENOMATOUS, HX OF 10/17/2008   PNEUMOTHORAX 10/17/2008    PCP: Merri Brunette, MD   REFERRING PROVIDER: Rachel Moulds, MD  REFERRING DIAG: C50.412,Z17.1 (ICD-10-CM) - Malignant neoplasm of upper-outer quadrant of left breast in female, estrogen receptor negative (HCC) , L axillary cording  THERAPY DIAG:  Stiffness  of left shoulder, not elsewhere classified  Aftercare following surgery for neoplasm  Lymphedema, not elsewhere classified  Abnormal posture  Malignant neoplasm of upper-outer quadrant of left breast in female, estrogen receptor negative (HCC)  ONSET DATE: 09/01/23  Rationale for Evaluation and Treatment: Rehabilitation  SUBJECTIVE:                                                                                                                                                                                           SUBJECTIVE STATEMENT: I'm feeling nauseous and lightheaded again today. It's been going on for a few weeks now  along with noticing some swelling into my Lt side of my neck and UT.  PERTINENT HISTORY: Patient was diagnosed on 08/15/2023 with left grade 3 invasive ductal carcinoma breast cancer. She underwent a left lumpectomy and sentinel node biopsy (13 negative nodes) on 07/29/2023. It is triple negative with a Ki67 of 95%. She has a history of a cervical fusion in 08/2022. Plans to start radiation soon.   PAIN:  Are you having pain? No  PRECAUTIONS: Other: L UE lymphedema risk, cervical fusion C6, C7  RED FLAGS: Compression fracture: Yes: T12    WEIGHT BEARING RESTRICTIONS: No  FALLS:  Has patient fallen in last 6 months? No  LIVING ENVIRONMENT: Lives with: lives with their spouse Lives in: House/apartment Stairs: Yes; External: 5 steps; can reach both Has following equipment at home: None  OCCUPATION: locksmith  LEISURE: was walking until last week but recently stopped due to stomach issues  HAND DOMINANCE: right   PRIOR LEVEL OF FUNCTION: Independent  PATIENT GOALS: to get my arm back to normal   OBJECTIVE:  COGNITION: Overall cognitive status: Within functional limits for tasks assessed   PALPATION: Cording palpable in L axilla, increased edema noticeable in LUE and L breast  OBSERVATIONS / OTHER ASSESSMENTS: L breast more full than R especially at lateral breast  POSTURE: forward head and rounded shoulders  UPPER EXTREMITY AROM/PROM:  A/PROM RIGHT   eval   Shoulder extension 70  Shoulder flexion 167  Shoulder abduction 166  Shoulder internal rotation 65  Shoulder external rotation 78    (Blank rows = not tested)  A/PROM LEFT   eval  Shoulder extension 65  Shoulder flexion 155  Shoulder abduction 160  Shoulder internal rotation 55  Shoulder external rotation 83    (Blank rows = not tested)    UPPER EXTREMITY STRENGTH:   LYMPHEDEMA ASSESSMENTS:   SURGERY TYPE/DATE: 07/29/23 L lumpectomy and SLNB  NUMBER OF LYMPH NODES REMOVED: 13  CHEMOTHERAPY: pt  declined chemo  RADIATION: plans to begin radiation soon  HORMONE TREATMENT: none  INFECTIONS: none   LYMPHEDEMA ASSESSMENTS:   LANDMARK RIGHT  eval  At axilla  35.5  15 cm proximal to olecranon process 30.8  10 cm proximal to olecranon process 30  Olecranon process 26.5  15 cm proximal to ulnar styloid process 23.9  10 cm proximal to ulnar styloid process 20.4  Just proximal to ulnar styloid process 17.4  Across hand at thumb web space 19.5  At base of 2nd digit 6.5  (Blank rows = not tested)  LANDMARK LEFT  eval  At axilla  35.9  15 cm proximal to olecranon process 32.5  10 cm proximal to olecranon process 31.5  Olecranon process 28  15 cm proximal to ulnar styloid process 24.5  10 cm proximal to ulnar styloid process 20.6  Just proximal to ulnar styloid process 18.1  Across hand at thumb web space 20.6  At base of 2nd digit 6.4  (Blank rows = not tested)    QUICK DASH SURVEY:       The patient was assessed using the L-Dex machine today to produce a lymphedema index baseline score. The patient will be reassessed on a regular basis (typically every 3 months) to obtain new L-Dex scores. If the score is > 6.5 points away from his/her baseline score indicating onset of subclinical lymphedema, it will be recommended to wear a compression garment for 4 weeks, 12 hours per day and then be reassessed. If the score continues to be > 6.5 points from baseline at reassessment, we will initiate lymphedema treatment. Assessing in this manner has a 95% rate of preventing clinically significant lymphedema.   TODAY'S TREATMENT:                                                                                                                                          DATE: 09/18/23: Manual therapy Manual lymph drainage in supine as follows: short neck, superficial and deep abdominals; right axillary nodes, right anterior intact thorax, anterior inter-axillary anastomoses, left  inguinal nodes, left axillo-inguinal anastomoses, then left breast, left upper extremity from fingers and dorsal hand to lateral shoulder redirecting along pathways.  P/ROM to Lt shoulder into flex, abd and D2 gently to pts tolerance and with scapular depression throughout by therapist; multiple VC's throughout to relax due to guarding.  MFR gently to medial Lt upper arm and antecubital fossa over area of cording; one small pop felt by pt and therapist at end of session over antecubital fossa STM in Rt S/L to Lt lateral trunk with cocoa butter Scap Mobs in Rt S/L to Lt scapula into protraction and retraction Cut and issued TG soft size medium for pt to wear in lieu of her compression sleeve until her new one arrives.   09/16/23: Measured pt for a new compression sleeve since the one given last session is too short and she is having swelling in  her hand. She fit in to an off the shelf Sigvaris Secure size M2 (long) and medium glove - issued info for pt to obtain this online Pulleys x 2 min in direction of flexion and 2 min in direction of abduction MFR throughout LUE to help decrease cording with numerous cords palpable and a large thick cord palpable in L axilla Began Manual lymph drainage in supine as follows: short neck, right axillary nodes, left inguinal nodes, superficial and deep abdominals; anterior inter-axillary anastamoses, left axillo-inguinal anastamoses. Left upper extremity from fingers and dorsal hand to lateral shoulder redirecting along pathways. Instructed pt throughout in sequence, skin stretch and anatomy and physiology of the lymphatic system.    09/09/23: Pulleys x 2 min in direction of flexion and 2 min in direction of abduction with pt returning therapist demo and feeling less discomfort at end of pulleys Measured pt for compression sleeve after increased Ldex score: Medi Harmony Size 3 EW and issued this to her    PATIENT EDUCATION:  Education details: importance of  stretching to decrease discomfort with cording, purchase over the door pulleys for AAROM at home, wear compression sleeve for 12 hours a day for 4 weeks, how to don/doff garments Person educated: Patient Education method: Medical illustrator Education comprehension: verbalized understanding and returned demonstration  HOME EXERCISE PROGRAM: Continue post op exercises Over the door pulleys Wear compression sleeve 12 hours a day for 4 weeks  ASSESSMENT:  CLINICAL IMPRESSION: Pt comes in c/o her compression sleeve feeling too tight and her hand has been swelling when she wears it. So issued her a Tg soft sleeve to wear instead until her new compression sleeve and glove arrives. Continued with MLD to Lt UE and added breast as well today. Also continued with manual therapy working to decrease overall Lt upper quadrant tightness. Pts P/ROM did improve by end of session.    OBJECTIVE IMPAIRMENTS: decreased knowledge of condition, decreased knowledge of use of DME, decreased ROM, increased edema, increased fascial restrictions, impaired UE functional use, postural dysfunction, and pain.   ACTIVITY LIMITATIONS: carrying, lifting, and reach over head  PARTICIPATION LIMITATIONS: meal prep, cleaning, laundry, and community activity  PERSONAL FACTORS:  none  are also affecting patient's functional outcome.   REHAB POTENTIAL: Good  CLINICAL DECISION MAKING: Stable/uncomplicated  EVALUATION COMPLEXITY: Low  GOALS: Goals reviewed with patient? Yes  SHORT TERM GOALS=LONG TERM GOALS Target date: 10/07/23  Pt will be independent with self MLD for L UE and L breast for long term management of lymphedema. Baseline: Goal status: INITIAL  2.  Pt will return to the green on SOZO demonstrating reversal of subclinical lymphedema. Baseline:  Goal status: INITIAL  3.  Pt will demonstrate 165 degrees of L shoulder flexion to allow her to reach overhead.  Baseline:  Goal status: INITIAL  4.   Pt will be independent in a home exercise program for continued stretching and strengthening.  Baseline:  Goal status: INITIAL  5.  Pt will report a 75% improvement in pain and discomfort from axillary cording to allow improved comfort.  Baseline:  Goal status: INITIAL  6.  Pt will obtain appropriate compression garments - sleeve and bra for long term management.  Baseline:  Goal status: INITIAL  PLAN:  PT FREQUENCY: 2x/week  PT DURATION: 4 weeks  PLANNED INTERVENTIONS: Therapeutic exercises, Therapeutic activity, Patient/Family education, Self Care, Joint mobilization, Orthotic/Fit training, Manual lymph drainage, Compression bandaging, scar mobilization, Taping, Vasopneumatic device, Manual therapy, and Re-evaluation  PLAN FOR NEXT  SESSION: cont MLD to L breast and LUE and instruct pt, MFR to cording in L axilla, did she get new sleeve?  Hermenia Bers, PTA 09/18/2023, 12:09 PM

## 2023-09-22 ENCOUNTER — Ambulatory Visit: Payer: Medicare Other

## 2023-09-22 DIAGNOSIS — Z483 Aftercare following surgery for neoplasm: Secondary | ICD-10-CM

## 2023-09-22 DIAGNOSIS — Z171 Estrogen receptor negative status [ER-]: Secondary | ICD-10-CM | POA: Diagnosis not present

## 2023-09-22 DIAGNOSIS — I890 Lymphedema, not elsewhere classified: Secondary | ICD-10-CM

## 2023-09-22 DIAGNOSIS — R293 Abnormal posture: Secondary | ICD-10-CM | POA: Diagnosis not present

## 2023-09-22 DIAGNOSIS — M25612 Stiffness of left shoulder, not elsewhere classified: Secondary | ICD-10-CM

## 2023-09-22 DIAGNOSIS — C50412 Malignant neoplasm of upper-outer quadrant of left female breast: Secondary | ICD-10-CM

## 2023-09-22 NOTE — Therapy (Signed)
OUTPATIENT PHYSICAL THERAPY  UPPER EXTREMITY ONCOLOGY TREATMENT  Patient Name: CLYTIE STANISZEWSKI MRN: [id number] DOB:[birth year], 72 y.o., female Today's Date: 09/22/2023  END OF SESSION:  PT End of Session - 09/22/23 1406     Visit Number 4    Number of Visits 9    Date for PT Re-Evaluation 10/07/23    PT Start Time 1404    PT Stop Time 1502    PT Time Calculation (min) 58 min    Activity Tolerance Patient tolerated treatment well    Behavior During Therapy WFL for tasks assessed/performed             Past Medical History:  Diagnosis Date   Adenomatous colon polyp    Allergy    Anxiety    Arthritis    bilateral hands. lower back   Cancer [facility])    skin   Cataract    bilateral   COPD (chronic obstructive pulmonary disease) (HCC)    Depression    Diverticulosis    Endometriosis    GERD (gastroesophageal reflux disease)    Glaucoma    Headache    Migraines   Hiatal hernia    History of left breast cancer 05/2023   IBS (irritable bowel syndrome)    Inguinal hernia    Pneumothorax 1987   PONV (postoperative nausea and vomiting)    Past Surgical History:  Procedure Laterality Date   ABDOMINAL HYSTERECTOMY  1992   ANTERIOR CERVICAL DECOMP/DISCECTOMY FUSION N/A 09/23/2022   Procedure: Anterior Decompression Fusion,PLATE/SCREWS Cervical five-six; REMOVAL OLD PLATE;  Surgeon: Tressie Stalker, MD;  Location: [facility] OR;  Service: Neurosurgery;  Laterality: N/A;   APPENDECTOMY  1992   BACK SURGERY     cervical   BREAST BIOPSY Left 06/23/2023   [place] LT BREAST BX W LOC DEV 1ST LESION IMG BX SPEC US GUIDE 06/23/2023 [facility]   BREAST BIOPSY Left 07/28/2023   [place] LT RADIOACTIVE SEED LOC 07/28/2023 [facility]   BREAST LUMPECTOMY WITH RADIOACTIVE SEED AND SENTINEL LYMPH NODE BIOPSY Left 07/29/2023   Procedure: LEFT BREAST SEED LUMPECTOMY WITH LEFT SENTINEL LYMPH NODE MAPPING;  Surgeon: Harriette Bouillon, MD;  Location: [facility] OR;  Service: General;  Laterality: Left;  PEC  BLOCK   CATARACT EXTRACTION Bilateral    2021, 2022   COLONOSCOPY  2023   PLEURAL SCARIFICATION     ruptured disk     torn tendon     right arm   TUBAL LIGATION     Patient Active Problem List   Diagnosis Date Noted   Genetic testing 07/11/2023   Malignant neoplasm of upper-outer quadrant of left breast in female, estrogen receptor negative (HCC) 06/30/2023   Cervical spondylosis with radiculopathy 09/23/2022   Elevated low-density lipoprotein level 06/14/2022   Coronary arteriosclerosis 06/14/2022   Adverse reaction to antihyperlipidemic drug, initial encounter 06/14/2022   Diarrhea 01/27/2019   Change in bowel habits 01/05/2019   Left inguinal hernia 06/27/2014   Bloating 05/17/2013   Unspecified constipation 05/17/2013   Pneumothorax 10/17/2008   HIATAL HERNIA 10/17/2008   DIVERTICULOSIS, COLON 10/17/2008   ENDOMETRIOSIS 10/17/2008   Right lower quadrant abdominal pain 10/17/2008   DEPRESSION, HX OF 10/17/2008   COLONIC POLYPS, ADENOMATOUS, HX OF 10/17/2008   PNEUMOTHORAX 10/17/2008    PCP: Merri Brunette, MD   REFERRING PROVIDER: Rachel Moulds, MD  REFERRING DIAG: C50.412,Z17.1 (ICD-10-CM) - Malignant neoplasm of upper-outer quadrant of left breast in female, estrogen receptor negative (HCC) , L axillary cording  THERAPY DIAG:  Stiffness  of left shoulder, not elsewhere classified  Aftercare following surgery for neoplasm  Lymphedema, not elsewhere classified  Abnormal posture  Malignant neoplasm of upper-outer quadrant of left breast in female, estrogen receptor negative (HCC)  ONSET DATE: 09/01/23  Rationale for Evaluation and Treatment: Rehabilitation  SUBJECTIVE:                                                                                                                                                                                           SUBJECTIVE STATEMENT: I am still feeling nauseous, tired and lightheaded. I have a phone appt with Dr. Al Pimple  Thursday so I'm going to talk to her about it then. My cording is getting better. It's not pulling as much when I reach, about a 6/10. The breast does not feel much better though. My new sleeve is supposed to arrive today. I've been wearing the TG soft you gave me and that feels like it has helped also.   PERTINENT HISTORY: Patient was diagnosed on 08/15/2023 with left grade 3 invasive ductal carcinoma breast cancer. She underwent a left lumpectomy and sentinel node biopsy (13 negative nodes) on 07/29/2023. It is triple negative with a Ki67 of 95%. She has a history of a cervical fusion in 08/2022. Plans to start radiation soon.   PAIN:  Are you having pain? No  PRECAUTIONS: Other: L UE lymphedema risk, cervical fusion C6, C7  RED FLAGS: Compression fracture: Yes: T12    WEIGHT BEARING RESTRICTIONS: No  FALLS:  Has patient fallen in last 6 months? No  LIVING ENVIRONMENT: Lives with: lives with their spouse Lives in: House/apartment Stairs: Yes; External: 5 steps; can reach both Has following equipment at home: None  OCCUPATION: locksmith  LEISURE: was walking until last week but recently stopped due to stomach issues  HAND DOMINANCE: right   PRIOR LEVEL OF FUNCTION: Independent  PATIENT GOALS: to get my arm back to normal   OBJECTIVE:  COGNITION: Overall cognitive status: Within functional limits for tasks assessed   PALPATION: Cording palpable in L axilla, increased edema noticeable in LUE and L breast  OBSERVATIONS / OTHER ASSESSMENTS: L breast more full than R especially at lateral breast  POSTURE: forward head and rounded shoulders  UPPER EXTREMITY AROM/PROM:  A/PROM RIGHT   eval   Shoulder extension 70  Shoulder flexion 167  Shoulder abduction 166  Shoulder internal rotation 65  Shoulder external rotation 78    (Blank rows = not tested)  A/PROM LEFT   eval  Shoulder extension 65  Shoulder flexion 155  Shoulder abduction 160  Shoulder internal rotation  55  Shoulder external rotation 83    (  Blank rows = not tested)    UPPER EXTREMITY STRENGTH:   LYMPHEDEMA ASSESSMENTS:   SURGERY TYPE/DATE: 07/29/23 L lumpectomy and SLNB  NUMBER OF LYMPH NODES REMOVED: 13  CHEMOTHERAPY: pt declined chemo  RADIATION: plans to begin radiation soon  HORMONE TREATMENT: none  INFECTIONS: none   LYMPHEDEMA ASSESSMENTS:   LANDMARK RIGHT  eval  At axilla  35.5  15 cm proximal to olecranon process 30.8  10 cm proximal to olecranon process 30  Olecranon process 26.5  15 cm proximal to ulnar styloid process 23.9  10 cm proximal to ulnar styloid process 20.4  Just proximal to ulnar styloid process 17.4  Across hand at thumb web space 19.5  At base of 2nd digit 6.5  (Blank rows = not tested)  LANDMARK LEFT  eval  At axilla  35.9  15 cm proximal to olecranon process 32.5  10 cm proximal to olecranon process 31.5  Olecranon process 28  15 cm proximal to ulnar styloid process 24.5  10 cm proximal to ulnar styloid process 20.6  Just proximal to ulnar styloid process 18.1  Across hand at thumb web space 20.6  At base of 2nd digit 6.4  (Blank rows = not tested)    QUICK DASH SURVEY:       The patient was assessed using the L-Dex machine today to produce a lymphedema index baseline score. The patient will be reassessed on a regular basis (typically every 3 months) to obtain new L-Dex scores. If the score is > 6.5 points away from his/her baseline score indicating onset of subclinical lymphedema, it will be recommended to wear a compression garment for 4 weeks, 12 hours per day and then be reassessed. If the score continues to be > 6.5 points from baseline at reassessment, we will initiate lymphedema treatment. Assessing in this manner has a 95% rate of preventing clinically significant lymphedema.   TODAY'S TREATMENT:                                                                                                                                           DATE: 09/22/23: Manual therapy Manual lymph drainage in supine as follows: short neck, superficial and deep abdominals; right axillary nodes, right anterior intact thorax, anterior inter-axillary anastomoses, left inguinal nodes, left axillo-inguinal anastomoses, then left breast, left upper extremity from fingers and dorsal hand to lateral shoulder redirecting along pathways.  P/ROM to Lt shoulder into flex, abd and D2 gently to pts tolerance and with scapular depression throughout by therapist; multiple VC's throughout to relax due to guarding but less so today MFR gently to medial Lt upper arm and antecubital fossa over area of cording, this less palpable today   09/18/23: Manual therapy Manual lymph drainage in supine as follows: short neck, superficial and deep abdominals; right axillary nodes, right anterior intact thorax, anterior inter-axillary anastomoses, left inguinal nodes, left axillo-inguinal anastomoses,  then left breast, left upper extremity from fingers and dorsal hand to lateral shoulder redirecting along pathways.  P/ROM to Lt shoulder into flex, abd and D2 gently to pts tolerance and with scapular depression throughout by therapist; multiple VC's throughout to relax due to guarding.  MFR gently to medial Lt upper arm and antecubital fossa over area of cording; one small pop felt by pt and therapist at end of session over antecubital fossa STM in Rt S/L to Lt lateral trunk with cocoa butter Scap Mobs in Rt S/L to Lt scapula into protraction and retraction Cut and issued TG soft size medium for pt to wear in lieu of her compression sleeve until her new one arrives.   09/16/23: Measured pt for a new compression sleeve since the one given last session is too short and she is having swelling in her hand. She fit in to an off the shelf Sigvaris Secure size M2 (long) and medium glove - issued info for pt to obtain this online Pulleys x 2 min in direction of flexion and 2 min  in direction of abduction MFR throughout LUE to help decrease cording with numerous cords palpable and a large thick cord palpable in L axilla Began Manual lymph drainage in supine as follows: short neck, right axillary nodes, left inguinal nodes, superficial and deep abdominals; anterior inter-axillary anastamoses, left axillo-inguinal anastamoses. Left upper extremity from fingers and dorsal hand to lateral shoulder redirecting along pathways. Instructed pt throughout in sequence, skin stretch and anatomy and physiology of the lymphatic system.    09/09/23: Pulleys x 2 min in direction of flexion and 2 min in direction of abduction with pt returning therapist demo and feeling less discomfort at end of pulleys Measured pt for compression sleeve after increased Ldex score: Medi Harmony Size 3 EW and issued this to her    PATIENT EDUCATION:  Education details: importance of stretching to decrease discomfort with cording, purchase over the door pulleys for AAROM at home, wear compression sleeve for 12 hours a day for 4 weeks, how to don/doff garments Person educated: Patient Education method: Medical illustrator Education comprehension: verbalized understanding and returned demonstration  HOME EXERCISE PROGRAM: Continue post op exercises Over the door pulleys Wear compression sleeve 12 hours a day for 4 weeks  ASSESSMENT:  CLINICAL IMPRESSION: Pt wore TG soft over weekend and reports her arm felt better with that on than with tight compression sleeve. Her new one should be arriving today along with compression glove.  She had new small area of swelling on her medial wrist she reports noticing after wearing her sleeve Sat. That sleeve is a bit tight and doesn't cover her ulnar styloid process so this is probably swelling from having no compression here. Pt will hold off on wearing that sleeve until new one arrives hopefully today. Will redo her SOZO in 30 days once she gets her new  compression sleeve and glove. Continued with manual therapy working to decrease her Lt UE cording and tightness which did seem improved today. Also continued with MLD to Lt breast and UE.    OBJECTIVE IMPAIRMENTS: decreased knowledge of condition, decreased knowledge of use of DME, decreased ROM, increased edema, increased fascial restrictions, impaired UE functional use, postural dysfunction, and pain.   ACTIVITY LIMITATIONS: carrying, lifting, and reach over head  PARTICIPATION LIMITATIONS: meal prep, cleaning, laundry, and community activity  PERSONAL FACTORS:  none  are also affecting patient's functional outcome.   REHAB POTENTIAL: Good  CLINICAL DECISION MAKING:  Stable/uncomplicated  EVALUATION COMPLEXITY: Low  GOALS: Goals reviewed with patient? Yes  SHORT TERM GOALS=LONG TERM GOALS Target date: 10/07/23  Pt will be independent with self MLD for L UE and L breast for long term management of lymphedema. Baseline: Goal status: INITIAL  2.  Pt will return to the green on SOZO demonstrating reversal of subclinical lymphedema. Baseline:  Goal status: INITIAL  3.  Pt will demonstrate 165 degrees of L shoulder flexion to allow her to reach overhead.  Baseline:  Goal status: INITIAL  4.  Pt will be independent in a home exercise program for continued stretching and strengthening.  Baseline:  Goal status: INITIAL  5.  Pt will report a 75% improvement in pain and discomfort from axillary cording to allow improved comfort.  Baseline:  Goal status: INITIAL  6.  Pt will obtain appropriate compression garments - sleeve and bra for long term management.  Baseline:  Goal status: INITIAL  PLAN:  PT FREQUENCY: 2x/week  PT DURATION: 4 weeks  PLANNED INTERVENTIONS: Therapeutic exercises, Therapeutic activity, Patient/Family education, Self Care, Joint mobilization, Orthotic/Fit training, Manual lymph drainage, Compression bandaging, scar mobilization, Taping, Vasopneumatic  device, Manual therapy, and Re-evaluation  PLAN FOR NEXT SESSION: cont MLD to L breast and LUE and instruct pt, MFR to cording in L axilla, did she get new sleeve? Assess fit  Hermenia Bers, PTA 09/22/2023, 4:59 PM

## 2023-09-23 DIAGNOSIS — Z170 Estrogen receptor positive status [ER+]: Secondary | ICD-10-CM | POA: Diagnosis not present

## 2023-09-23 DIAGNOSIS — Z510 Encounter for antineoplastic radiation therapy: Secondary | ICD-10-CM | POA: Diagnosis not present

## 2023-09-23 DIAGNOSIS — Z171 Estrogen receptor negative status [ER-]: Secondary | ICD-10-CM | POA: Diagnosis not present

## 2023-09-23 DIAGNOSIS — C50412 Malignant neoplasm of upper-outer quadrant of left female breast: Secondary | ICD-10-CM | POA: Diagnosis not present

## 2023-09-24 ENCOUNTER — Ambulatory Visit: Payer: Medicare Other

## 2023-09-24 DIAGNOSIS — Z171 Estrogen receptor negative status [ER-]: Secondary | ICD-10-CM

## 2023-09-24 DIAGNOSIS — M25612 Stiffness of left shoulder, not elsewhere classified: Secondary | ICD-10-CM

## 2023-09-24 DIAGNOSIS — R293 Abnormal posture: Secondary | ICD-10-CM | POA: Diagnosis not present

## 2023-09-24 DIAGNOSIS — C50412 Malignant neoplasm of upper-outer quadrant of left female breast: Secondary | ICD-10-CM | POA: Diagnosis not present

## 2023-09-24 DIAGNOSIS — Z483 Aftercare following surgery for neoplasm: Secondary | ICD-10-CM | POA: Diagnosis not present

## 2023-09-24 DIAGNOSIS — I890 Lymphedema, not elsewhere classified: Secondary | ICD-10-CM | POA: Diagnosis not present

## 2023-09-24 NOTE — Therapy (Signed)
OUTPATIENT PHYSICAL THERAPY  UPPER EXTREMITY ONCOLOGY TREATMENT  Patient Name: [name] [name] MRN: [id number] DOB:06-08-1951, 72 y.o., female Today's Date: 09/24/2023  END OF SESSION:  PT End of Session - 09/24/23 1112     Visit Number 5    Number of Visits 9    Date for PT Re-Evaluation 10/07/23    PT Start Time 1107    PT Stop Time 1203    PT Time Calculation (min) 56 min    Activity Tolerance Patient tolerated treatment well    Behavior During Therapy [facility] for tasks assessed/performed             Past Medical History:  Diagnosis Date   Adenomatous colon polyp    Allergy    Anxiety    Arthritis    bilateral hands. lower back   Cancer [facility])    skin   Cataract    bilateral   COPD (chronic obstructive pulmonary disease) (HCC)    Depression    Diverticulosis    Endometriosis    GERD (gastroesophageal reflux disease)    Glaucoma    Headache    Migraines   Hiatal hernia    History of left breast cancer 05/2023   IBS (irritable bowel syndrome)    Inguinal hernia    Pneumothorax 1987   PONV (postoperative nausea and vomiting)    Past Surgical History:  Procedure Laterality Date   ABDOMINAL HYSTERECTOMY  1992   ANTERIOR CERVICAL DECOMP/DISCECTOMY FUSION N/A 09/23/2022   Procedure: Anterior Decompression Fusion,PLATE/SCREWS Cervical five-six; REMOVAL OLD PLATE;  Surgeon: Tressie Stalker, MD;  Location: [facility] OR;  Service: Neurosurgery;  Laterality: N/A;   APPENDECTOMY  1992   BACK SURGERY     cervical   BREAST BIOPSY Left 06/23/2023   [place] LT BREAST BX W LOC DEV 1ST LESION IMG BX SPEC US GUIDE 06/23/2023 [facility]   BREAST BIOPSY Left 07/28/2023   [place] LT RADIOACTIVE SEED LOC 07/28/2023 [facility]   BREAST LUMPECTOMY WITH RADIOACTIVE SEED AND SENTINEL LYMPH NODE BIOPSY Left 07/29/2023   Procedure: LEFT BREAST SEED LUMPECTOMY WITH LEFT SENTINEL LYMPH NODE MAPPING;  Surgeon: Harriette Bouillon, MD;  Location: [facility] OR;  Service: General;  Laterality: Left;  PEC  BLOCK   CATARACT EXTRACTION Bilateral    2021, 2022   COLONOSCOPY  2023   PLEURAL SCARIFICATION     ruptured disk     torn tendon     right arm   TUBAL LIGATION     Patient Active Problem List   Diagnosis Date Noted   Genetic testing 07/11/2023   Malignant neoplasm of upper-outer quadrant of left breast in female, estrogen receptor negative (HCC) 06/30/2023   Cervical spondylosis with radiculopathy 09/23/2022   Elevated low-density lipoprotein level 06/14/2022   Coronary arteriosclerosis 06/14/2022   Adverse reaction to antihyperlipidemic drug, initial encounter 06/14/2022   Diarrhea 01/27/2019   Change in bowel habits 01/05/2019   Left inguinal hernia 06/27/2014   Bloating 05/17/2013   Unspecified constipation 05/17/2013   Pneumothorax 10/17/2008   HIATAL HERNIA 10/17/2008   DIVERTICULOSIS, COLON 10/17/2008   ENDOMETRIOSIS 10/17/2008   Right lower quadrant abdominal pain 10/17/2008   DEPRESSION, HX OF 10/17/2008   COLONIC POLYPS, ADENOMATOUS, HX OF 10/17/2008   PNEUMOTHORAX 10/17/2008    PCP: Merri Brunette, MD   REFERRING PROVIDER: Rachel Moulds, MD  REFERRING DIAG: C50.412,Z17.1 (ICD-10-CM) - Malignant neoplasm of upper-outer quadrant of left breast in female, estrogen receptor negative (HCC) , L axillary cording  THERAPY DIAG:  Stiffness  of left shoulder, not elsewhere classified  Aftercare following surgery for neoplasm  Lymphedema, not elsewhere classified  Abnormal posture  Malignant neoplasm of upper-outer quadrant of left breast in female, estrogen receptor negative (HCC)  ONSET DATE: 09/01/23  Rationale for Evaluation and Treatment: Rehabilitation  SUBJECTIVE:                                                                                                                                                                                           SUBJECTIVE STATEMENT: I got my new compression sleeve and glove. They are more comfortable but the rubber dots by  the end of the day start to bother me. My Lt breast has been feeling a bit better. It's not as tender to touch.   PERTINENT HISTORY: Patient was diagnosed on 08/15/2023 with left grade 3 invasive ductal carcinoma breast cancer. She underwent a left lumpectomy and sentinel node biopsy (13 negative nodes) on 07/29/2023. It is triple negative with a Ki67 of 95%. She has a history of a cervical fusion in 08/2022. Plans to start radiation soon.   PAIN:  Are you having pain? No  PRECAUTIONS: Other: L UE lymphedema risk, cervical fusion C6, C7  RED FLAGS: Compression fracture: Yes: T12    WEIGHT BEARING RESTRICTIONS: No  FALLS:  Has patient fallen in last 6 months? No  LIVING ENVIRONMENT: Lives with: lives with their spouse Lives in: House/apartment Stairs: Yes; External: 5 steps; can reach both Has following equipment at home: None  OCCUPATION: locksmith  LEISURE: was walking until last week but recently stopped due to stomach issues  HAND DOMINANCE: right   PRIOR LEVEL OF FUNCTION: Independent  PATIENT GOALS: to get my arm back to normal   OBJECTIVE:  COGNITION: Overall cognitive status: Within functional limits for tasks assessed   PALPATION: Cording palpable in L axilla, increased edema noticeable in LUE and L breast  OBSERVATIONS / OTHER ASSESSMENTS: L breast more full than R especially at lateral breast  POSTURE: forward head and rounded shoulders  UPPER EXTREMITY AROM/PROM:  A/PROM RIGHT   eval   Shoulder extension 70  Shoulder flexion 167  Shoulder abduction 166  Shoulder internal rotation 65  Shoulder external rotation 78    (Blank rows = not tested)  A/PROM LEFT   eval  Shoulder extension 65  Shoulder flexion 155  Shoulder abduction 160  Shoulder internal rotation 55  Shoulder external rotation 83    (Blank rows = not tested)    UPPER EXTREMITY STRENGTH:   LYMPHEDEMA ASSESSMENTS:   SURGERY TYPE/DATE: 07/29/23 L lumpectomy and SLNB  NUMBER OF  LYMPH NODES REMOVED: 13  CHEMOTHERAPY: pt declined chemo  RADIATION: plans to begin radiation soon  HORMONE TREATMENT: none  INFECTIONS: none   LYMPHEDEMA ASSESSMENTS:   LANDMARK RIGHT  eval  At axilla  35.5  15 cm proximal to olecranon process 30.8  10 cm proximal to olecranon process 30  Olecranon process 26.5  15 cm proximal to ulnar styloid process 23.9  10 cm proximal to ulnar styloid process 20.4  Just proximal to ulnar styloid process 17.4  Across hand at thumb web space 19.5  At base of 2nd digit 6.5  (Blank rows = not tested)  LANDMARK LEFT  eval  At axilla  35.9  15 cm proximal to olecranon process 32.5  10 cm proximal to olecranon process 31.5  Olecranon process 28  15 cm proximal to ulnar styloid process 24.5  10 cm proximal to ulnar styloid process 20.6  Just proximal to ulnar styloid process 18.1  Across hand at thumb web space 20.6  At base of 2nd digit 6.4  (Blank rows = not tested)    QUICK DASH SURVEY:       The patient was assessed using the L-Dex machine today to produce a lymphedema index baseline score. The patient will be reassessed on a regular basis (typically every 3 months) to obtain new L-Dex scores. If the score is > 6.5 points away from his/her baseline score indicating onset of subclinical lymphedema, it will be recommended to wear a compression garment for 4 weeks, 12 hours per day and then be reassessed. If the score continues to be > 6.5 points from baseline at reassessment, we will initiate lymphedema treatment. Assessing in this manner has a 95% rate of preventing clinically significant lymphedema.   TODAY'S TREATMENT:                                                                                                                                          DATE: 09/24/23: Manual therapy Instructed pt in following today having her return demo of each step and cuing offered throughout to adjust for lighter pressure: Manual lymph  drainage in supine as follows: short neck, 5 diaphragmatic breaths; right axillary nodes, anterior inter-axillary anastomoses, left inguinal nodes, left axillo-inguinal anastomoses, then left breast, left upper extremity working from proximal to distal down to fingers and dorsal hand and back to lateral shoulder redirecting along pathways.  P/ROM to Lt shoulder into flex, abd and D2 gently to pts tolerance and with scapular depression throughout by therapist; multiple VC's throughout to relax due to guarding but less so today MFR gently to medial Lt upper arm and antecubital fossa over area of cording, this less palpable today  09/22/23: Manual therapy Manual lymph drainage in supine as follows: short neck, superficial and deep abdominals; right axillary nodes, right anterior intact thorax, anterior inter-axillary anastomoses, left inguinal nodes, left axillo-inguinal anastomoses, then left breast, left upper extremity from fingers and  dorsal hand to lateral shoulder redirecting along pathways.  P/ROM to Lt shoulder into flex, abd and D2 gently to pts tolerance and with scapular depression throughout by therapist; multiple VC's throughout to relax due to guarding but less so today MFR gently to medial Lt upper arm and antecubital fossa over area of cording, this less palpable today   09/18/23: Manual therapy Manual lymph drainage in supine as follows: short neck, superficial and deep abdominals; right axillary nodes, right anterior intact thorax, anterior inter-axillary anastomoses, left inguinal nodes, left axillo-inguinal anastomoses, then left breast, left upper extremity from fingers and dorsal hand to lateral shoulder redirecting along pathways.  P/ROM to Lt shoulder into flex, abd and D2 gently to pts tolerance and with scapular depression throughout by therapist; multiple VC's throughout to relax due to guarding.  MFR gently to medial Lt upper arm and antecubital fossa over area of cording; one  small pop felt by pt and therapist at end of session over antecubital fossa STM in Rt S/L to Lt lateral trunk with cocoa butter Scap Mobs in Rt S/L to Lt scapula into protraction and retraction Cut and issued TG soft size medium for pt to wear in lieu of her compression sleeve until her new one arrives.   09/16/23: Measured pt for a new compression sleeve since the one given last session is too short and she is having swelling in her hand. She fit in to an off the shelf Sigvaris Secure size M2 (long) and medium glove - issued info for pt to obtain this online Pulleys x 2 min in direction of flexion and 2 min in direction of abduction MFR throughout LUE to help decrease cording with numerous cords palpable and a large thick cord palpable in L axilla Began Manual lymph drainage in supine as follows: short neck, right axillary nodes, left inguinal nodes, superficial and deep abdominals; anterior inter-axillary anastamoses, left axillo-inguinal anastamoses. Left upper extremity from fingers and dorsal hand to lateral shoulder redirecting along pathways. Instructed pt throughout in sequence, skin stretch and anatomy and physiology of the lymphatic system.    09/09/23: Pulleys x 2 min in direction of flexion and 2 min in direction of abduction with pt returning therapist demo and feeling less discomfort at end of pulleys Measured pt for compression sleeve after increased Ldex score: Medi Harmony Size 3 EW and issued this to her    PATIENT EDUCATION:  Education details: Self MLD Person educated: Patient Education method: Medical illustrator, handout given Education comprehension: verbalized understanding and returned demonstration, will benefit from further review  HOME EXERCISE PROGRAM: Continue post op exercises Over the door pulleys Wear compression sleeve 12 hours a day for 4 weeks 09/24/23: Self MLD  ASSESSMENT:  CLINICAL IMPRESSION: Pts new sleeve fts well. Instructed her in self  MLD today which she did well with returning demo. Handout issued and cuing given throughout.   OBJECTIVE IMPAIRMENTS: decreased knowledge of condition, decreased knowledge of use of DME, decreased ROM, increased edema, increased fascial restrictions, impaired UE functional use, postural dysfunction, and pain.   ACTIVITY LIMITATIONS: carrying, lifting, and reach over head  PARTICIPATION LIMITATIONS: meal prep, cleaning, laundry, and community activity  PERSONAL FACTORS:  none  are also affecting patient's functional outcome.   REHAB POTENTIAL: Good  CLINICAL DECISION MAKING: Stable/uncomplicated  EVALUATION COMPLEXITY: Low  GOALS: Goals reviewed with patient? Yes  SHORT TERM GOALS=LONG TERM GOALS Target date: 10/07/23  Pt will be independent with self MLD for L UE and L breast  for long term management of lymphedema. Baseline: Goal status: INITIAL  2.  Pt will return to the green on [place] demonstrating reversal of subclinical lymphedema. Baseline:  Goal status: INITIAL  3.  Pt will demonstrate 165 degrees of L shoulder flexion to allow her to reach overhead.  Baseline:  Goal status: INITIAL  4.  Pt will be independent in a home exercise program for continued stretching and strengthening.  Baseline:  Goal status: INITIAL  5.  Pt will report a 75% improvement in pain and discomfort from axillary cording to allow improved comfort.  Baseline:  Goal status: INITIAL  6.  Pt will obtain appropriate compression garments - sleeve and bra for long term management.  Baseline:  Goal status: INITIAL  PLAN:  PT FREQUENCY: 2x/week  PT DURATION: 4 weeks  PLANNED INTERVENTIONS: Therapeutic exercises, Therapeutic activity, Patient/Family education, Self Care, Joint mobilization, Orthotic/Fit training, Manual lymph drainage, Compression bandaging, scar mobilization, Taping, Vasopneumatic device, Manual therapy, and Re-evaluation  PLAN FOR NEXT SESSION: cont and review MLD to L breast  and LUE, MFR to cording in L axilla, did she get new sleeve? Assess fit  Hermenia Bers, PTA 09/24/2023, 12:07 PM   Hug yourself.  Do circles at your neck just above your collarbones.  Repeat this 10 times.  Diaphragmatic - Supine   Inhale through nose making navel move out toward hands. Exhale through puckered lips, hands follow navel in. Repeat _5__ times. Rest _10__ seconds between repeats.    Axilla to Axilla - Sweep   On uninvolved side make 5 circles in the armpit, then pump _5__ times from involved armpit across chest to uninvolved armpit, making a pathway. Do _1__ time per day.  Copyright  VHI. All rights reserved.  Axilla to Inguinal Nodes - Sweep   On involved side, make 5 circles at groin at panty line, then pump _5__ times from armpit along side of trunk to outer hip, making your other pathway. Do __1_ time per day.  BREAST SEQUENCE: Draw an imaginary diagonal line from upper outer breast through the nipple area toward lower inner breast.  Direct fluid upward and inward from this line toward the pathway across your upper chest .  Do this in three rows to treat all of the upper inner breast tissue, and do each row 3-4x.      Direct fluid to treat all of lower outer breast tissue downward and outward toward pathway that is aimed at the left groin.   Arm Posterior: Elbow to Shoulder - Sweep   Pump _5__ times from back of elbow to top of shoulder. Then inner to outer upper arm _5_ times, then outer arm again _5_ times. Then back to the pathways _2-3_ times. Do _1__ time per day.  Copyright  VHI. All rights reserved.  ARM: Volar Wrist to Elbow - Sweep   Pump or stationary circles _5__ times from wrist to elbow making sure to do both sides of the forearm. Then retrace your steps to the outer arm, and the pathways _2-3_ times each. Do _1__ time per day.  Copyright  VHI. All rights reserved.  ARM: Dorsum of Hand to Shoulder - Sweep   Pump or  stationary circles _5__ times on back of hand including knuckle spaces and individual fingers if needed working up towards the wrist, then retrace all your steps working back up the forearm, doing both sides; upper outer arm and back to your pathways _2-3_ times each. Then do 5 circles again at  uninvolved armpit and involved groin where you started! Good job!! Do __1_ time per day.  Cancer Rehab [phone]

## 2023-09-25 ENCOUNTER — Other Ambulatory Visit: Payer: Self-pay

## 2023-09-25 ENCOUNTER — Inpatient Hospital Stay (HOSPITAL_BASED_OUTPATIENT_CLINIC_OR_DEPARTMENT_OTHER): Payer: Medicare Other | Admitting: Hematology and Oncology

## 2023-09-25 ENCOUNTER — Ambulatory Visit
Admission: RE | Admit: 2023-09-25 | Discharge: 2023-09-25 | Disposition: A | Discharge status: Home / Self Care | Payer: Medicare Other | Source: Ambulatory Visit | Attending: Radiation Oncology | Admitting: Radiation Oncology

## 2023-09-25 DIAGNOSIS — Z170 Estrogen receptor positive status [ER+]: Secondary | ICD-10-CM | POA: Diagnosis not present

## 2023-09-25 DIAGNOSIS — C50412 Malignant neoplasm of upper-outer quadrant of left female breast: Secondary | ICD-10-CM

## 2023-09-25 DIAGNOSIS — Z171 Estrogen receptor negative status [ER-]: Secondary | ICD-10-CM | POA: Diagnosis not present

## 2023-09-25 DIAGNOSIS — Z510 Encounter for antineoplastic radiation therapy: Secondary | ICD-10-CM | POA: Diagnosis not present

## 2023-09-25 LAB — RAD ONC ARIA SESSION SUMMARY
Course Elapsed Days: 0
Plan Fractions Treated to Date: 1
Plan Prescribed Dose Per Fraction: 2.67 Gy
Plan Total Fractions Prescribed: 16
Plan Total Prescribed Dose: 42.72 Gy
Reference Point Dosage Given to Date: 2.67 Gy
Reference Point Session Dosage Given: 2.67 Gy
Session Number: 1

## 2023-09-25 NOTE — Assessment & Plan Note (Addendum)
This is a pleasant 72 year old postmenopausal female patient with newly diagnosed left breast T2 N0 triple negative invasive ductal carcinoma referred to breast [facility] for additional recommendations.  Given triple negative biology and size of the tumor over 2 cm, we have discussed about both neoadjuvant and adjuvant approach. I have discussed neoadjuvant chemotherapy options including keynote 522 versus SCARLET trial versus adjuvant approach.  She has decent performance status but is mostly limited by her diarrhea predominant IBS as well as COPD given her prior history of smoking.  After much discussion we have discussed about proceeding with upfront surgery followed by consideration for adjuvant chemotherapy. She understands that the benefit of adjuvant chemo is to reduce the risk of recurrence.  She did not however want to proceed with chemotherapy.   This is a follow up telephone visit. Slightly fluctuant nodularity in the lateral segment right middle lobe, largest nodule measuring 1.0 cm. This is almost certainly infectious or inflammatory given distribution, however recommend close attention on follow-up in 3 months or at interval otherwise indicated by oncology surveillance protocol.  We will repeat CT chest in 3 months.  CT neck is unremarkable.  Her nausea, dizziness are likely unrelated to radiation or recent surgery.  She denies any new medications.  I have recommended that she also contact her PCP for further recommendations.   She will return to clinic as scheduled

## 2023-09-25 NOTE — Progress Notes (Signed)
[facility] CONSULT NOTE  Patient Care Team: Merri Brunette, MD as PCP - General (Internal Medicine) Rachel Moulds, MD as Consulting Physician (Hematology and Oncology) Harriette Bouillon, MD as Consulting Physician (General Surgery) Antony Blackbird, MD as Consulting Physician (Radiation Oncology) Donnelly Angelica, RN as Oncology Nurse Navigator Pershing Proud, RN as Oncology Nurse Navigator  CHIEF COMPLAINTS/PURPOSE OF CONSULTATION:  Newly diagnosed breast cancer  HISTORY OF PRESENTING ILLNESS:  [name] [name] 72 y.o. female is here because of recent diagnosis of left breast cancer  I reviewed her records extensively and collaborated the history with the patient.  SUMMARY OF ONCOLOGIC HISTORY: Oncology History  Malignant neoplasm of upper-outer quadrant of left breast in female, estrogen receptor negative (HCC)  06/03/2023 Mammogram   Screening mammogram bilaterally showed possible mass in the left breast warranting further evaluation.  Diagnostic mammogram confirmed highly suspicious 2.2 cm upper outer left breast mass, no abnormal appearing left axillary lymph nodes   06/23/2023 Pathology Results   Pathology results from the left breast needle core biopsy showed grade 3 invasive poorly differentiated ductal carcinoma, focal high-grade DCIS, cribriform type, negative for angiolymphatic invasion, negative for micro calcs, prognostic showed ER 0% negative, PR 0% negative, HER2 negative and Ki-67 of 95%.   06/30/2023 Initial Diagnosis   Malignant neoplasm of upper-outer quadrant of left breast in female, estrogen receptor negative (HCC)    Genetic Testing   [facility] Custom Panel+RNA was Negative. Of note, a variant of uncertain significance was detected in the MSH3 gene (c.1027+4T>C (Intronic)). Report date is 07/10/2023.  The Custom Hereditary Cancers Panel offered by [facility] includes sequencing and/or deletion duplication testing of the following 46 genes: APC, ATM, AXIN2,  BAP1, BARD1, BMPR1A, BRCA1, BRCA2, BRIP1, CDH1, CDK4, CDKN2A (p14ARF and p16INK4a only), CHEK2, CTNNA1, DICER1, EPCAM (Deletion/duplication testing only), FH, GREM1 (promoter region duplication testing only), HOXB13, KIT, MBD4, MEN1, MLH1, MSH2, MSH3, MSH6, MUTYH, NF1, NHTL1, PALB2, PDGFRA, PMS2, POLD1, POLE, PRKAR1A, PTEN, RAD51C, RAD51D, RET, SMAD4, SMARCA4. STK11, TP53, TSC1, TSC2, and VHL.     This is a pleasant 72 year old female patient with past medical history significant for COPD, not oxygen dependent, irritable bowel syndrome, diarrhea predominant depression, well-controlled on Zoloft, anxiety, arthritis referred to breast [facility] for new diagnosis of left breast invasive ductal carcinoma.  She declined adjuvant chemotherapy. This is a telephone visit to review CT results.   She is here for a follow up via telephone visit.  She says she is feeling nauseated, dizziness for the past 3/4 weeks.  She just darted radiation, so she does not think this is related to that.  Rest of the pertinent 10 point ROS reviewed and negative   MEDICAL HISTORY:  Past Medical History:  Diagnosis Date   Adenomatous colon polyp    Allergy    Anxiety    Arthritis    bilateral hands. lower back   Cancer [facility])    skin   Cataract    bilateral   COPD (chronic obstructive pulmonary disease) (HCC)    Depression    Diverticulosis    Endometriosis    GERD (gastroesophageal reflux disease)    Glaucoma    Headache    Migraines   Hiatal hernia    History of left breast cancer 05/2023   IBS (irritable bowel syndrome)    Inguinal hernia    Pneumothorax 1987   PONV (postoperative nausea and vomiting)     SURGICAL HISTORY: Past Surgical History:  Procedure Laterality Date   ABDOMINAL HYSTERECTOMY  1992   ANTERIOR CERVICAL DECOMP/DISCECTOMY FUSION N/A 09/23/2022   Procedure: Anterior Decompression Fusion,PLATE/SCREWS Cervical five-six; REMOVAL OLD PLATE;  Surgeon: Tressie Stalker, MD;  Location: [facility] OR;   Service: Neurosurgery;  Laterality: N/A;   APPENDECTOMY  1992   BACK SURGERY     cervical   BREAST BIOPSY Left 06/23/2023   [place] LT BREAST BX W LOC DEV 1ST LESION IMG BX SPEC US GUIDE 06/23/2023 [facility] MAMMOGRAPHY   BREAST BIOPSY Left 07/28/2023   [place] LT RADIOACTIVE SEED LOC 07/28/2023 [facility] MAMMOGRAPHY   BREAST LUMPECTOMY WITH RADIOACTIVE SEED AND SENTINEL LYMPH NODE BIOPSY Left 07/29/2023   Procedure: LEFT BREAST SEED LUMPECTOMY WITH LEFT SENTINEL LYMPH NODE MAPPING;  Surgeon: Harriette Bouillon, MD;  Location: [facility] OR;  Service: General;  Laterality: Left;  PEC BLOCK   CATARACT EXTRACTION Bilateral    2021, 2022   COLONOSCOPY  2023   PLEURAL SCARIFICATION     ruptured disk     torn tendon     right arm   TUBAL LIGATION      SOCIAL HISTORY: Social History   Socioeconomic History   Marital status: Married    Spouse name: Not on file   Number of children: 2   Years of education: Not on file   Highest education level: Not on file  Occupational History   Occupation: locksmith  Tobacco Use   Smoking status: Former    Current packs/day: 0.00    Average packs/day: 1.5 packs/day for 40.1 years (60.2 ttl pk-yrs)    Types: Cigarettes    Start date: 12/31/1971    Quit date: 02/10/2012    Years since quitting: 11.6   Smokeless tobacco: Never  Vaping Use   Vaping status: Never Used  Substance and Sexual Activity   Alcohol use: No   Drug use: No   Sexual activity: Never  Other Topics Concern   Not on file  Social History Narrative   Not on file   Social Determinants of Health   Financial Resource Strain: Not on file  Food Insecurity: No Food Insecurity (09/15/2023)   Hunger Vital Sign    Worried About Running Out of Food in the Last Year: Never true    Ran Out of Food in the Last Year: Never true  Transportation Needs: No Transportation Needs (09/15/2023)   PRAPARE - Administrator, Civil Service (Medical): No    Lack of Transportation (Non-Medical): No  Physical  Activity: Not on file  Stress: Not on file  Social Connections: Unknown (05/12/2022)   [facility]   Social Network    Social Network: Not on file  Intimate Partner Violence: Not At Risk (09/15/2023)   Humiliation, Afraid, Rape, and Kick questionnaire    Fear of Current or Ex-Partner: No    Emotionally Abused: No    Physically Abused: No    Sexually Abused: No    FAMILY HISTORY: Family History  Problem Relation Age of Onset   Heart failure Mother    Crohn's disease Sister    Osteoporosis Sister    Neuropathy Sister    Colon polyps Sister    Lung cancer Sister 55       smoked   Thyroid disease Sister    Thyroid cancer Maternal Grandmother    Colon cancer Neg Hx    Breast cancer Neg Hx    Esophageal cancer Neg Hx    Pancreatic cancer Neg Hx    Stomach cancer Neg Hx  ALLERGIES:  is allergic to bupropion, cefaclor, elemental sulfur, sulfa antibiotics, latex, and ofloxacin.  MEDICATIONS:  Current Outpatient Medications  Medication Sig Dispense Refill   albuterol (VENTOLIN HFA) 108 (90 Base) MCG/ACT inhaler Inhale 2 puffs into the lungs every 6 (six) hours as needed. 18 g 5   ALPRAZolam (XANAX) 0.25 MG tablet Take 0.25 mg by mouth 3 (three) times daily as needed for anxiety.     aspirin EC 81 MG tablet Take 81 mg by mouth in the morning. Swallow whole.     Bacillus Coagulans-Inulin (PROBIOTIC) 1-250 BILLION-MG CAPS Take 1 capsule by mouth every evening.     cetirizine (ZYRTEC) 10 MG tablet Take 1 tablet (10 mg total) by mouth daily. 30 tablet 11   Cholecalciferol (VITAMIN D PO) Take 5,000 Units by mouth in the morning.     denosumab (PROLIA) 60 MG/ML SOSY injection Inject 60 mg into the skin every 6 (six) months.     famotidine (PEPCID) 20 MG tablet Take 1 tablet (20 mg total) by mouth 2 (two) times daily. As needed (Patient taking differently: Take 20 mg by mouth daily as needed for heartburn or indigestion.) 60 tablet 6   fluticasone (FLONASE)  50 MCG/ACT nasal spray Place into both nostrils.     inclisiran (LEQVIO) 284 MG/1.5ML SOSY injection Inject 284 mg into the skin every 6 (six) months. Every three months     loperamide (IMODIUM) 2 MG capsule Take 2 mg by mouth as needed for diarrhea or loose stools.     MELATONIN-CHAMOMILE PO Take 1 tablet by mouth at bedtime.     promethazine (PHENERGAN) 12.5 MG suppository Place 12.5 mg rectally every 6 (six) hours as needed for nausea or vomiting.     sertraline (ZOLOFT) 100 MG tablet Take 50 mg by mouth in the morning.     No current facility-administered medications for this visit.    REVIEW OF SYSTEMS:   Constitutional: Denies fevers, chills or abnormal night sweats Eyes: Denies blurriness of vision, double vision or watery eyes Ears, nose, mouth, throat, and face: Denies mucositis or sore throat Respiratory: Denies cough, dyspnea or wheezes Cardiovascular: Denies palpitation, chest discomfort or lower extremity swelling Gastrointestinal:  Denies nausea, heartburn or change in bowel habits Skin: Denies abnormal skin rashes Lymphatics: Denies new lymphadenopathy or easy bruising Neurological:Denies numbness, tingling or new weaknesses Behavioral/Psych: Mood is stable, no new changes  All other systems were reviewed with the patient and are negative.  PHYSICAL EXAMINATION: ECOG PERFORMANCE STATUS: 0 - Asymptomatic  There were no vitals filed for this visit.  There were no vitals filed for this visit.   PE deferred, telephone visit.  LABORATORY DATA:  I have reviewed the data as listed Lab Results  Component Value Date   WBC 7.4 09/08/2023   HGB 14.7 09/08/2023   HCT 44.9 09/08/2023   MCV 95.1 09/08/2023   PLT 240 09/08/2023   Lab Results  Component Value Date   NA 141 09/08/2023   K 4.9 09/08/2023   CL 108 09/08/2023   CO2 28 09/08/2023    RADIOGRAPHIC STUDIES: I have personally reviewed the radiological reports and agreed with the findings in the  report.  ASSESSMENT AND PLAN:  Malignant neoplasm of upper-outer quadrant of left breast in female, estrogen receptor negative (HCC) This is a pleasant 72 year old postmenopausal female patient with newly diagnosed left breast T2 N0 triple negative invasive ductal carcinoma referred to breast [facility] for additional recommendations.  Given triple negative biology and size of  the tumor over 2 cm, we have discussed about both neoadjuvant and adjuvant approach. I have discussed neoadjuvant chemotherapy options including keynote 522 versus SCARLET trial versus adjuvant approach.  She has decent performance status but is mostly limited by her diarrhea predominant IBS as well as COPD given her prior history of smoking.  After much discussion we have discussed about proceeding with upfront surgery followed by consideration for adjuvant chemotherapy. She understands that the benefit of adjuvant chemo is to reduce the risk of recurrence.  She did not however want to proceed with chemotherapy.   This is a follow up telephone visit. Slightly fluctuant nodularity in the lateral segment right middle lobe, largest nodule measuring 1.0 cm. This is almost certainly infectious or inflammatory given distribution, however recommend close attention on follow-up in 3 months or at interval otherwise indicated by oncology surveillance protocol.  We will repeat CT chest in 3 months.  CT neck is unremarkable.  Her nausea, dizziness are likely unrelated to radiation or recent surgery.  She denies any new medications.  I have recommended that she also contact her PCP for further recommendations.   She will return to clinic as scheduled   I connected with  Caylen A Tuley on 09/26/23 by a telephone application and verified that I am speaking with the correct person using two identifiers.   I discussed the limitations of evaluation and management by telemedicine. The patient expressed understanding and agreed to proceed. Time  spent: 10 min  All questions were answered. The patient knows to call the clinic with any problems, questions or concerns.    Rachel Moulds, MD 09/26/23

## 2023-09-26 ENCOUNTER — Ambulatory Visit
Admission: RE | Admit: 2023-09-26 | Discharge: 2023-09-26 | Disposition: A | Discharge status: Home / Self Care | Payer: Medicare Other | Source: Ambulatory Visit | Attending: Radiation Oncology | Admitting: Radiation Oncology

## 2023-09-26 ENCOUNTER — Other Ambulatory Visit: Payer: Self-pay

## 2023-09-26 ENCOUNTER — Encounter: Payer: Self-pay | Admitting: Hematology and Oncology

## 2023-09-26 ENCOUNTER — Encounter: Payer: Self-pay | Admitting: Pulmonary Disease

## 2023-09-26 DIAGNOSIS — Z510 Encounter for antineoplastic radiation therapy: Secondary | ICD-10-CM | POA: Diagnosis not present

## 2023-09-26 DIAGNOSIS — Z170 Estrogen receptor positive status [ER+]: Secondary | ICD-10-CM | POA: Diagnosis not present

## 2023-09-26 DIAGNOSIS — C50412 Malignant neoplasm of upper-outer quadrant of left female breast: Secondary | ICD-10-CM | POA: Diagnosis not present

## 2023-09-26 DIAGNOSIS — Z171 Estrogen receptor negative status [ER-]: Secondary | ICD-10-CM | POA: Diagnosis not present

## 2023-09-26 LAB — RAD ONC ARIA SESSION SUMMARY
Course Elapsed Days: 1
Plan Fractions Treated to Date: 2
Plan Prescribed Dose Per Fraction: 2.67 Gy
Plan Total Fractions Prescribed: 16
Plan Total Prescribed Dose: 42.72 Gy
Reference Point Dosage Given to Date: 5.34 Gy
Reference Point Session Dosage Given: 2.67 Gy
Session Number: 2

## 2023-09-29 ENCOUNTER — Ambulatory Visit
Admission: RE | Admit: 2023-09-29 | Discharge: 2023-09-29 | Disposition: A | Discharge status: Home / Self Care | Payer: Medicare Other | Source: Ambulatory Visit | Attending: Radiation Oncology | Admitting: Radiation Oncology

## 2023-09-29 ENCOUNTER — Encounter: Payer: Self-pay | Admitting: Physical Therapy

## 2023-09-29 ENCOUNTER — Telehealth: Payer: Self-pay

## 2023-09-29 ENCOUNTER — Ambulatory Visit: Payer: Medicare Other | Admitting: Physical Therapy

## 2023-09-29 ENCOUNTER — Other Ambulatory Visit: Payer: Self-pay

## 2023-09-29 DIAGNOSIS — Z171 Estrogen receptor negative status [ER-]: Secondary | ICD-10-CM | POA: Diagnosis not present

## 2023-09-29 DIAGNOSIS — R293 Abnormal posture: Secondary | ICD-10-CM

## 2023-09-29 DIAGNOSIS — I890 Lymphedema, not elsewhere classified: Secondary | ICD-10-CM

## 2023-09-29 DIAGNOSIS — Z510 Encounter for antineoplastic radiation therapy: Secondary | ICD-10-CM | POA: Diagnosis not present

## 2023-09-29 DIAGNOSIS — M25612 Stiffness of left shoulder, not elsewhere classified: Secondary | ICD-10-CM | POA: Diagnosis not present

## 2023-09-29 DIAGNOSIS — Z170 Estrogen receptor positive status [ER+]: Secondary | ICD-10-CM | POA: Diagnosis not present

## 2023-09-29 DIAGNOSIS — Z483 Aftercare following surgery for neoplasm: Secondary | ICD-10-CM | POA: Diagnosis not present

## 2023-09-29 DIAGNOSIS — C50412 Malignant neoplasm of upper-outer quadrant of left female breast: Secondary | ICD-10-CM | POA: Diagnosis not present

## 2023-09-29 LAB — RAD ONC ARIA SESSION SUMMARY
Course Elapsed Days: 4
Plan Fractions Treated to Date: 3
Plan Prescribed Dose Per Fraction: 2.67 Gy
Plan Total Fractions Prescribed: 16
Plan Total Prescribed Dose: 42.72 Gy
Reference Point Dosage Given to Date: 8.01 Gy
Reference Point Session Dosage Given: 2.67 Gy
Session Number: 3

## 2023-09-29 NOTE — Therapy (Signed)
OUTPATIENT PHYSICAL THERAPY  UPPER EXTREMITY ONCOLOGY TREATMENT  Patient Name: [name] [name] MRN: [id number] DOB:1951-10-24, 72 y.o., female Today's Date: 09/29/2023  END OF SESSION:  PT End of Session - 09/29/23 1310     Visit Number 6    Number of Visits 9    Date for PT Re-Evaluation 10/07/23    PT Start Time 1206    PT Stop Time 1256    PT Time Calculation (min) 50 min    Activity Tolerance Patient tolerated treatment well    Behavior During Therapy [facility] for tasks assessed/performed              Past Medical History:  Diagnosis Date   Adenomatous colon polyp    Allergy    Anxiety    Arthritis    bilateral hands. lower back   Cancer [facility])    skin   Cataract    bilateral   COPD (chronic obstructive pulmonary disease) (HCC)    Depression    Diverticulosis    Endometriosis    GERD (gastroesophageal reflux disease)    Glaucoma    Headache    Migraines   Hiatal hernia    History of left breast cancer 05/2023   IBS (irritable bowel syndrome)    Inguinal hernia    Pneumothorax 1987   PONV (postoperative nausea and vomiting)    Past Surgical History:  Procedure Laterality Date   ABDOMINAL HYSTERECTOMY  1992   ANTERIOR CERVICAL DECOMP/DISCECTOMY FUSION N/A 09/23/2022   Procedure: Anterior Decompression Fusion,PLATE/SCREWS Cervical five-six; REMOVAL OLD PLATE;  Surgeon: Tressie Stalker, MD;  Location: [facility] OR;  Service: Neurosurgery;  Laterality: N/A;   APPENDECTOMY  1992   BACK SURGERY     cervical   BREAST BIOPSY Left 06/23/2023   [place] LT BREAST BX W LOC DEV 1ST LESION IMG BX SPEC US GUIDE 06/23/2023 [facility]   BREAST BIOPSY Left 07/28/2023   [place] LT RADIOACTIVE SEED LOC 07/28/2023 [facility]   BREAST LUMPECTOMY WITH RADIOACTIVE SEED AND SENTINEL LYMPH NODE BIOPSY Left 07/29/2023   Procedure: LEFT BREAST SEED LUMPECTOMY WITH LEFT SENTINEL LYMPH NODE MAPPING;  Surgeon: Harriette Bouillon, MD;  Location: [facility] OR;  Service: General;  Laterality: Left;  PEC  BLOCK   CATARACT EXTRACTION Bilateral    2021, 2022   COLONOSCOPY  2023   PLEURAL SCARIFICATION     ruptured disk     torn tendon     right arm   TUBAL LIGATION     Patient Active Problem List   Diagnosis Date Noted   Genetic testing 07/11/2023   Malignant neoplasm of upper-outer quadrant of left breast in female, estrogen receptor negative (HCC) 06/30/2023   Cervical spondylosis with radiculopathy 09/23/2022   Elevated low-density lipoprotein level 06/14/2022   Coronary arteriosclerosis 06/14/2022   Adverse reaction to antihyperlipidemic drug, initial encounter 06/14/2022   Diarrhea 01/27/2019   Change in bowel habits 01/05/2019   Left inguinal hernia 06/27/2014   Bloating 05/17/2013   Unspecified constipation 05/17/2013   Pneumothorax 10/17/2008   HIATAL HERNIA 10/17/2008   DIVERTICULOSIS, COLON 10/17/2008   ENDOMETRIOSIS 10/17/2008   Right lower quadrant abdominal pain 10/17/2008   DEPRESSION, HX OF 10/17/2008   COLONIC POLYPS, ADENOMATOUS, HX OF 10/17/2008   PNEUMOTHORAX 10/17/2008    PCP: Merri Brunette, MD   REFERRING PROVIDER: Rachel Moulds, MD  REFERRING DIAG: C50.412,Z17.1 (ICD-10-CM) - Malignant neoplasm of upper-outer quadrant of left breast in female, estrogen receptor negative (HCC) , L axillary cording  THERAPY DIAG:  Stiffness of left shoulder, not elsewhere classified  Aftercare following surgery for neoplasm  Lymphedema, not elsewhere classified  Abnormal posture  Malignant neoplasm of upper-outer quadrant of left breast in female, estrogen receptor negative (HCC)  ONSET DATE: 09/01/23  Rationale for Evaluation and Treatment: Rehabilitation  SUBJECTIVE:                                                                                                                                                                                           SUBJECTIVE STATEMENT: The only time I have pain is when I move my arm in certain ways. I have trouble with the  breast MLD sequence.   PERTINENT HISTORY: Patient was diagnosed on 08/15/2023 with left grade 3 invasive ductal carcinoma breast cancer. She underwent a left lumpectomy and sentinel node biopsy (13 negative nodes) on 07/29/2023. It is triple negative with a Ki67 of 95%. She has a history of a cervical fusion in 08/2022. Plans to start radiation soon.   PAIN:  Are you having pain? No  PRECAUTIONS: Other: L UE lymphedema risk, cervical fusion C6, C7  RED FLAGS: Compression fracture: Yes: T12    WEIGHT BEARING RESTRICTIONS: No  FALLS:  Has patient fallen in last 6 months? No  LIVING ENVIRONMENT: Lives with: lives with their spouse Lives in: House/apartment Stairs: Yes; External: 5 steps; can reach both Has following equipment at home: None  OCCUPATION: locksmith  LEISURE: was walking until last week but recently stopped due to stomach issues  HAND DOMINANCE: right   PRIOR LEVEL OF FUNCTION: Independent  PATIENT GOALS: to get my arm back to normal   OBJECTIVE:  COGNITION: Overall cognitive status: Within functional limits for tasks assessed   PALPATION: Cording palpable in L axilla, increased edema noticeable in LUE and L breast  OBSERVATIONS / OTHER ASSESSMENTS: L breast more full than R especially at lateral breast  POSTURE: forward head and rounded shoulders  UPPER EXTREMITY AROM/PROM:  A/PROM RIGHT   eval   Shoulder extension 70  Shoulder flexion 167  Shoulder abduction 166  Shoulder internal rotation 65  Shoulder external rotation 78    (Blank rows = not tested)  A/PROM LEFT   eval  Shoulder extension 65  Shoulder flexion 155  Shoulder abduction 160  Shoulder internal rotation 55  Shoulder external rotation 83    (Blank rows = not tested)    UPPER EXTREMITY STRENGTH:   LYMPHEDEMA ASSESSMENTS:   SURGERY TYPE/DATE: 07/29/23 L lumpectomy and SLNB  NUMBER OF LYMPH NODES REMOVED: 13  CHEMOTHERAPY: pt declined chemo  RADIATION: plans to begin  radiation soon  HORMONE TREATMENT: none  INFECTIONS:  none   LYMPHEDEMA ASSESSMENTS:   LANDMARK RIGHT  eval  At axilla  35.5  15 cm proximal to olecranon process 30.8  10 cm proximal to olecranon process 30  Olecranon process 26.5  15 cm proximal to ulnar styloid process 23.9  10 cm proximal to ulnar styloid process 20.4  Just proximal to ulnar styloid process 17.4  Across hand at thumb web space 19.5  At base of 2nd digit 6.5  (Blank rows = not tested)  LANDMARK LEFT  eval  At axilla  35.9  15 cm proximal to olecranon process 32.5  10 cm proximal to olecranon process 31.5  Olecranon process 28  15 cm proximal to ulnar styloid process 24.5  10 cm proximal to ulnar styloid process 20.6  Just proximal to ulnar styloid process 18.1  Across hand at thumb web space 20.6  At base of 2nd digit 6.4  (Blank rows = not tested)    QUICK DASH SURVEY:       The patient was assessed using the L-Dex machine today to produce a lymphedema index baseline score. The patient will be reassessed on a regular basis (typically every 3 months) to obtain new L-Dex scores. If the score is > 6.5 points away from his/her baseline score indicating onset of subclinical lymphedema, it will be recommended to wear a compression garment for 4 weeks, 12 hours per day and then be reassessed. If the score continues to be > 6.5 points from baseline at reassessment, we will initiate lymphedema treatment. Assessing in this manner has a 95% rate of preventing clinically significant lymphedema.   TODAY'S TREATMENT:                                                                                                                                          DATE: 09/29/23: Manual therapy Continued to instruct pt in proper technqiue and answered her questions regarding breast MLD having her return demo each step and cuing offered throughout to adjust for lighter pressure and direction of skin stretch: Manual lymph  drainage in supine as follows: short neck, 5 diaphragmatic breaths; right axillary nodes, anterior inter-axillary anastomoses, left inguinal nodes, left axillo-inguinal anastomoses, then left breast, left upper extremity working from proximal to distal down to fingers and dorsal hand and back to lateral shoulder redirecting along pathways.  MFR gently to L axilla where there is a thick cord and to antecubital fossa over area of cording, this less palpable today  09/24/23: Manual therapy Instructed pt in following today having her return demo of each step and cuing offered throughout to adjust for lighter pressure: Manual lymph drainage in supine as follows: short neck, 5 diaphragmatic breaths; right axillary nodes, anterior inter-axillary anastomoses, left inguinal nodes, left axillo-inguinal anastomoses, then left breast, left upper extremity working from proximal to distal down to fingers and dorsal hand and back to lateral shoulder redirecting along  pathways.  P/ROM to Lt shoulder into flex, abd and D2 gently to pts tolerance and with scapular depression throughout by therapist; multiple VC's throughout to relax due to guarding but less so today MFR gently to medial Lt upper arm and antecubital fossa over area of cording, this less palpable today  09/22/23: Manual therapy Manual lymph drainage in supine as follows: short neck, superficial and deep abdominals; right axillary nodes, right anterior intact thorax, anterior inter-axillary anastomoses, left inguinal nodes, left axillo-inguinal anastomoses, then left breast, left upper extremity from fingers and dorsal hand to lateral shoulder redirecting along pathways.  P/ROM to Lt shoulder into flex, abd and D2 gently to pts tolerance and with scapular depression throughout by therapist; multiple VC's throughout to relax due to guarding but less so today MFR gently to medial Lt upper arm and antecubital fossa over area of cording, this less palpable  today   09/18/23: Manual therapy Manual lymph drainage in supine as follows: short neck, superficial and deep abdominals; right axillary nodes, right anterior intact thorax, anterior inter-axillary anastomoses, left inguinal nodes, left axillo-inguinal anastomoses, then left breast, left upper extremity from fingers and dorsal hand to lateral shoulder redirecting along pathways.  P/ROM to Lt shoulder into flex, abd and D2 gently to pts tolerance and with scapular depression throughout by therapist; multiple VC's throughout to relax due to guarding.  MFR gently to medial Lt upper arm and antecubital fossa over area of cording; one small pop felt by pt and therapist at end of session over antecubital fossa STM in Rt S/L to Lt lateral trunk with cocoa butter Scap Mobs in Rt S/L to Lt scapula into protraction and retraction Cut and issued TG soft size medium for pt to wear in lieu of her compression sleeve until her new one arrives.   09/16/23: Measured pt for a new compression sleeve since the one given last session is too short and she is having swelling in her hand. She fit in to an off the shelf Sigvaris Secure size M2 (long) and medium glove - issued info for pt to obtain this online Pulleys x 2 min in direction of flexion and 2 min in direction of abduction MFR throughout LUE to help decrease cording with numerous cords palpable and a large thick cord palpable in L axilla Began Manual lymph drainage in supine as follows: short neck, right axillary nodes, left inguinal nodes, superficial and deep abdominals; anterior inter-axillary anastamoses, left axillo-inguinal anastamoses. Left upper extremity from fingers and dorsal hand to lateral shoulder redirecting along pathways. Instructed pt throughout in sequence, skin stretch and anatomy and physiology of the lymphatic system.    09/09/23: Pulleys x 2 min in direction of flexion and 2 min in direction of abduction with pt returning therapist demo and  feeling less discomfort at end of pulleys Measured pt for compression sleeve after increased Ldex score: Medi Harmony Size 3 EW and issued this to her    PATIENT EDUCATION:  Education details: Self MLD Person educated: Patient Education method: Medical illustrator, handout given Education comprehension: verbalized understanding and returned demonstration, will benefit from further review  HOME EXERCISE PROGRAM: Continue post op exercises Over the door pulleys Wear compression sleeve 12 hours a day for 4 weeks 09/24/23: Self MLD  ASSESSMENT:  CLINICAL IMPRESSION: Answered pt's questions regarding self MLD for her breast. Re educated her in correct technique and had her return demonstrate. Pt reports understanding of technique at end of session. Will continue to assess for independence.  OBJECTIVE IMPAIRMENTS: decreased knowledge of condition, decreased knowledge of use of DME, decreased ROM, increased edema, increased fascial restrictions, impaired UE functional use, postural dysfunction, and pain.   ACTIVITY LIMITATIONS: carrying, lifting, and reach over head  PARTICIPATION LIMITATIONS: meal prep, cleaning, laundry, and community activity  PERSONAL FACTORS:  none  are also affecting patient's functional outcome.   REHAB POTENTIAL: Good  CLINICAL DECISION MAKING: Stable/uncomplicated  EVALUATION COMPLEXITY: Low  GOALS: Goals reviewed with patient? Yes  SHORT TERM GOALS=LONG TERM GOALS Target date: 10/07/23  Pt will be independent with self MLD for L UE and L breast for long term management of lymphedema. Baseline: Goal status: INITIAL  2.  Pt will return to the green on SOZO demonstrating reversal of subclinical lymphedema. Baseline:  Goal status: INITIAL  3.  Pt will demonstrate 165 degrees of L shoulder flexion to allow her to reach overhead.  Baseline:  Goal status: INITIAL  4.  Pt will be independent in a home exercise program for continued  stretching and strengthening.  Baseline:  Goal status: INITIAL  5.  Pt will report a 75% improvement in pain and discomfort from axillary cording to allow improved comfort.  Baseline:  Goal status: INITIAL  6.  Pt will obtain appropriate compression garments - sleeve and bra for long term management.  Baseline:  Goal status: INITIAL  PLAN:  PT FREQUENCY: 2x/week  PT DURATION: 4 weeks  PLANNED INTERVENTIONS: Therapeutic exercises, Therapeutic activity, Patient/Family education, Self Care, Joint mobilization, Orthotic/Fit training, Manual lymph drainage, Compression bandaging, scar mobilization, Taping, Vasopneumatic device, Manual therapy, and Re-evaluation  PLAN FOR NEXT SESSION: cont and review MLD to L breast and LUE, MFR to cording in L axilla, did she get new sleeve? Assess fit  Leonette Most, PT 09/29/2023, 1:15 PM   Hug yourself.  Do circles at your neck just above your collarbones.  Repeat this 10 times.  Diaphragmatic - Supine   Inhale through nose making navel move out toward hands. Exhale through puckered lips, hands follow navel in. Repeat _5__ times. Rest _10__ seconds between repeats.    Axilla to Axilla - Sweep   On uninvolved side make 5 circles in the armpit, then pump _5__ times from involved armpit across chest to uninvolved armpit, making a pathway. Do _1__ time per day.  Copyright  VHI. All rights reserved.  Axilla to Inguinal Nodes - Sweep   On involved side, make 5 circles at groin at panty line, then pump _5__ times from armpit along side of trunk to outer hip, making your other pathway. Do __1_ time per day.  BREAST SEQUENCE: Draw an imaginary diagonal line from upper outer breast through the nipple area toward lower inner breast.  Direct fluid upward and inward from this line toward the pathway across your upper chest .  Do this in three rows to treat all of the upper inner breast tissue, and do each row 3-4x.      Direct fluid to  treat all of lower outer breast tissue downward and outward toward pathway that is aimed at the left groin.   Arm Posterior: Elbow to Shoulder - Sweep   Pump _5__ times from back of elbow to top of shoulder. Then inner to outer upper arm _5_ times, then outer arm again _5_ times. Then back to the pathways _2-3_ times. Do _1__ time per day.  Copyright  VHI. All rights reserved.  ARM: Volar Wrist to Elbow - Sweep   Pump or stationary circles _5__ times  from wrist to elbow making sure to do both sides of the forearm. Then retrace your steps to the outer arm, and the pathways _2-3_ times each. Do _1__ time per day.  Copyright  VHI. All rights reserved.  ARM: Dorsum of Hand to Shoulder - Sweep   Pump or stationary circles _5__ times on back of hand including knuckle spaces and individual fingers if needed working up towards the wrist, then retrace all your steps working back up the forearm, doing both sides; upper outer arm and back to your pathways _2-3_ times each. Then do 5 circles again at uninvolved armpit and involved groin where you started! Good job!! Do __1_ time per day.  Cancer Rehab [phone]

## 2023-09-29 NOTE — Telephone Encounter (Signed)
Error

## 2023-09-30 ENCOUNTER — Other Ambulatory Visit: Payer: Self-pay

## 2023-09-30 ENCOUNTER — Ambulatory Visit
Admission: RE | Admit: 2023-09-30 | Discharge: 2023-09-30 | Disposition: A | Discharge status: Home / Self Care | Payer: Medicare Other | Source: Ambulatory Visit | Attending: Radiation Oncology | Admitting: Radiation Oncology

## 2023-09-30 DIAGNOSIS — Z171 Estrogen receptor negative status [ER-]: Secondary | ICD-10-CM | POA: Diagnosis not present

## 2023-09-30 DIAGNOSIS — C50412 Malignant neoplasm of upper-outer quadrant of left female breast: Secondary | ICD-10-CM | POA: Diagnosis not present

## 2023-09-30 DIAGNOSIS — Z510 Encounter for antineoplastic radiation therapy: Secondary | ICD-10-CM | POA: Diagnosis not present

## 2023-09-30 LAB — RAD ONC ARIA SESSION SUMMARY
Course Elapsed Days: 5
Plan Fractions Treated to Date: 4
Plan Prescribed Dose Per Fraction: 2.67 Gy
Plan Total Fractions Prescribed: 16
Plan Total Prescribed Dose: 42.72 Gy
Reference Point Dosage Given to Date: 10.68 Gy
Reference Point Session Dosage Given: 2.67 Gy
Session Number: 4

## 2023-09-30 MED ORDER — ALRA NON-METALLIC DEODORANT (RAD-ONC)
1.0000 | Freq: Once | TOPICAL | Status: AC
  Administered 2023-09-30: 1 via TOPICAL

## 2023-09-30 MED ORDER — RADIAPLEXRX EX GEL: Freq: Once | CUTANEOUS | Status: AC

## 2023-10-01 ENCOUNTER — Inpatient Hospital Stay (HOSPITAL_BASED_OUTPATIENT_CLINIC_OR_DEPARTMENT_OTHER): Payer: Medicare Other | Admitting: Physician Assistant

## 2023-10-01 ENCOUNTER — Telehealth: Payer: Self-pay

## 2023-10-01 ENCOUNTER — Other Ambulatory Visit: Payer: Self-pay

## 2023-10-01 ENCOUNTER — Ambulatory Visit: Payer: Medicare Other | Attending: Surgery | Admitting: Physical Therapy

## 2023-10-01 ENCOUNTER — Inpatient Hospital Stay: Payer: Medicare Other

## 2023-10-01 ENCOUNTER — Ambulatory Visit
Admission: RE | Admit: 2023-10-01 | Discharge: 2023-10-01 | Disposition: A | Discharge status: Home / Self Care | Payer: Medicare Other | Source: Ambulatory Visit | Attending: Radiation Oncology | Admitting: Radiation Oncology

## 2023-10-01 ENCOUNTER — Encounter: Payer: Self-pay | Admitting: Physical Therapy

## 2023-10-01 VITALS — BP 152/83 | HR 64 | Temp 97.8°F | Resp 16 | Wt 220.0 lb

## 2023-10-01 DIAGNOSIS — Z83719 Family history of colon polyps, unspecified: Secondary | ICD-10-CM | POA: Insufficient documentation

## 2023-10-01 DIAGNOSIS — I890 Lymphedema, not elsewhere classified: Secondary | ICD-10-CM | POA: Insufficient documentation

## 2023-10-01 DIAGNOSIS — Z860101 Personal history of adenomatous and serrated colon polyps: Secondary | ICD-10-CM | POA: Insufficient documentation

## 2023-10-01 DIAGNOSIS — Z8262 Family history of osteoporosis: Secondary | ICD-10-CM | POA: Insufficient documentation

## 2023-10-01 DIAGNOSIS — M7989 Other specified soft tissue disorders: Secondary | ICD-10-CM | POA: Insufficient documentation

## 2023-10-01 DIAGNOSIS — C50412 Malignant neoplasm of upper-outer quadrant of left female breast: Secondary | ICD-10-CM | POA: Insufficient documentation

## 2023-10-01 DIAGNOSIS — Z90710 Acquired absence of both cervix and uterus: Secondary | ICD-10-CM | POA: Insufficient documentation

## 2023-10-01 DIAGNOSIS — Z8379 Family history of other diseases of the digestive system: Secondary | ICD-10-CM | POA: Insufficient documentation

## 2023-10-01 DIAGNOSIS — L539 Erythematous condition, unspecified: Secondary | ICD-10-CM

## 2023-10-01 DIAGNOSIS — Z171 Estrogen receptor negative status [ER-]: Secondary | ICD-10-CM | POA: Insufficient documentation

## 2023-10-01 DIAGNOSIS — Z888 Allergy status to other drugs, medicaments and biological substances status: Secondary | ICD-10-CM | POA: Insufficient documentation

## 2023-10-01 DIAGNOSIS — Z79899 Other long term (current) drug therapy: Secondary | ICD-10-CM | POA: Insufficient documentation

## 2023-10-01 DIAGNOSIS — Z510 Encounter for antineoplastic radiation therapy: Secondary | ICD-10-CM | POA: Diagnosis not present

## 2023-10-01 DIAGNOSIS — Z8349 Family history of other endocrine, nutritional and metabolic diseases: Secondary | ICD-10-CM | POA: Insufficient documentation

## 2023-10-01 DIAGNOSIS — Z881 Allergy status to other antibiotic agents status: Secondary | ICD-10-CM | POA: Insufficient documentation

## 2023-10-01 DIAGNOSIS — R600 Localized edema: Secondary | ICD-10-CM | POA: Insufficient documentation

## 2023-10-01 DIAGNOSIS — Z87891 Personal history of nicotine dependence: Secondary | ICD-10-CM | POA: Insufficient documentation

## 2023-10-01 DIAGNOSIS — M25612 Stiffness of left shoulder, not elsewhere classified: Secondary | ICD-10-CM | POA: Insufficient documentation

## 2023-10-01 DIAGNOSIS — Z801 Family history of malignant neoplasm of trachea, bronchus and lung: Secondary | ICD-10-CM | POA: Insufficient documentation

## 2023-10-01 DIAGNOSIS — Z9049 Acquired absence of other specified parts of digestive tract: Secondary | ICD-10-CM | POA: Insufficient documentation

## 2023-10-01 DIAGNOSIS — Z8249 Family history of ischemic heart disease and other diseases of the circulatory system: Secondary | ICD-10-CM | POA: Insufficient documentation

## 2023-10-01 DIAGNOSIS — R293 Abnormal posture: Secondary | ICD-10-CM | POA: Insufficient documentation

## 2023-10-01 DIAGNOSIS — Z483 Aftercare following surgery for neoplasm: Secondary | ICD-10-CM | POA: Insufficient documentation

## 2023-10-01 DIAGNOSIS — Z882 Allergy status to sulfonamides status: Secondary | ICD-10-CM | POA: Insufficient documentation

## 2023-10-01 LAB — CBC WITH DIFFERENTIAL (CANCER CENTER ONLY)
Abs Immature Granulocytes: 0.02 10*3/uL (ref 0.00–0.07)
Basophils Absolute: 0 10*3/uL (ref 0.0–0.1)
Basophils Relative: 1 %
Eosinophils Absolute: 0.1 10*3/uL (ref 0.0–0.5)
Eosinophils Relative: 2 %
HCT: 40.9 % (ref 36.0–46.0)
Hemoglobin: 13.7 g/dL (ref 12.0–15.0)
Immature Granulocytes: 0 %
Lymphocytes Relative: 19 %
Lymphs Abs: 1.4 10*3/uL (ref 0.7–4.0)
MCH: 31.6 pg (ref 26.0–34.0)
MCHC: 33.5 g/dL (ref 30.0–36.0)
MCV: 94.5 fL (ref 80.0–100.0)
Monocytes Absolute: 0.4 10*3/uL (ref 0.1–1.0)
Monocytes Relative: 6 %
Neutro Abs: 5.5 10*3/uL (ref 1.7–7.7)
Neutrophils Relative %: 72 %
Platelet Count: 258 10*3/uL (ref 150–400)
RBC: 4.33 MIL/uL (ref 3.87–5.11)
RDW: 15 % (ref 11.5–15.5)
WBC Count: 7.6 10*3/uL (ref 4.0–10.5)
nRBC: 0 % (ref 0.0–0.2)

## 2023-10-01 LAB — CMP (CANCER CENTER ONLY)
ALT: 10 U/L (ref 0–44)
AST: 13 U/L — ABNORMAL LOW (ref 15–41)
Albumin: 4 g/dL (ref 3.5–5.0)
Alkaline Phosphatase: 73 U/L (ref 38–126)
Anion gap: 5 (ref 5–15)
BUN: 17 mg/dL (ref 8–23)
CO2: 28 mmol/L (ref 22–32)
Calcium: 9.2 mg/dL (ref 8.9–10.3)
Chloride: 108 mmol/L (ref 98–111)
Creatinine: 1.21 mg/dL — ABNORMAL HIGH (ref 0.44–1.00)
GFR, Estimated: 48 mL/min — ABNORMAL LOW (ref >60)
Glucose, Bld: 101 mg/dL — ABNORMAL HIGH (ref 70–99)
Potassium: 4.7 mmol/L (ref 3.5–5.1)
Sodium: 141 mmol/L (ref 135–145)
Total Bilirubin: 0.3 mg/dL (ref 0.3–1.2)
Total Protein: 7 g/dL (ref 6.5–8.1)

## 2023-10-01 LAB — RAD ONC ARIA SESSION SUMMARY
Course Elapsed Days: 6
Plan Fractions Treated to Date: 5
Plan Prescribed Dose Per Fraction: 2.67 Gy
Plan Total Fractions Prescribed: 16
Plan Total Prescribed Dose: 42.72 Gy
Reference Point Dosage Given to Date: 13.35 Gy
Reference Point Session Dosage Given: 2.67 Gy
Session Number: 5

## 2023-10-01 MED ORDER — CEPHALEXIN 500 MG PO CAPS: 500.0000 mg | ORAL_CAPSULE | Freq: Two times a day (BID) | ORAL | 0 refills | Status: AC

## 2023-10-01 NOTE — Therapy (Signed)
OUTPATIENT PHYSICAL THERAPY  UPPER EXTREMITY ONCOLOGY TREATMENT  Patient Name: [name] [name] MRN: [id number] DOB:1951-12-05, 72 y.o., female Today's Date: 10/01/2023  END OF SESSION:  PT End of Session - 10/01/23 1222     Visit Number 6   arrived no charge   Number of Visits 9    Date for PT Re-Evaluation 10/07/23    PT Start Time 1202    PT Stop Time 1220    PT Time Calculation (min) 18 min    Activity Tolerance Treatment limited secondary to medical complications (Comment)   possible cellulits   Behavior During Therapy [facility] for tasks assessed/performed              Past Medical History:  Diagnosis Date   Adenomatous colon polyp    Allergy    Anxiety    Arthritis    bilateral hands. lower back   Cancer [facility])    skin   Cataract    bilateral   COPD (chronic obstructive pulmonary disease) (HCC)    Depression    Diverticulosis    Endometriosis    GERD (gastroesophageal reflux disease)    Glaucoma    Headache    Migraines   Hiatal hernia    History of left breast cancer 05/2023   IBS (irritable bowel syndrome)    Inguinal hernia    Pneumothorax 1987   PONV (postoperative nausea and vomiting)    Past Surgical History:  Procedure Laterality Date   ABDOMINAL HYSTERECTOMY  1992   ANTERIOR CERVICAL DECOMP/DISCECTOMY FUSION N/A 09/23/2022   Procedure: Anterior Decompression Fusion,PLATE/SCREWS Cervical five-six; REMOVAL OLD PLATE;  Surgeon: Tressie Stalker, MD;  Location: [facility] OR;  Service: Neurosurgery;  Laterality: N/A;   APPENDECTOMY  1992   BACK SURGERY     cervical   BREAST BIOPSY Left 06/23/2023   [place] LT BREAST BX W LOC DEV 1ST LESION IMG BX SPEC US GUIDE 06/23/2023 [facility]   BREAST BIOPSY Left 07/28/2023   [place] LT RADIOACTIVE SEED LOC 07/28/2023 [facility]   BREAST LUMPECTOMY WITH RADIOACTIVE SEED AND SENTINEL LYMPH NODE BIOPSY Left 07/29/2023   Procedure: LEFT BREAST SEED LUMPECTOMY WITH LEFT SENTINEL LYMPH NODE MAPPING;  Surgeon: Harriette Bouillon, MD;  Location: [facility] OR;  Service: General;  Laterality: Left;  PEC BLOCK   CATARACT EXTRACTION Bilateral    2021, 2022   COLONOSCOPY  2023   PLEURAL SCARIFICATION     ruptured disk     torn tendon     right arm   TUBAL LIGATION     Patient Active Problem List   Diagnosis Date Noted   Genetic testing 07/11/2023   Malignant neoplasm of upper-outer quadrant of left breast in female, estrogen receptor negative (HCC) 06/30/2023   Cervical spondylosis with radiculopathy 09/23/2022   Elevated low-density lipoprotein level 06/14/2022   Coronary arteriosclerosis 06/14/2022   Adverse reaction to antihyperlipidemic drug, initial encounter 06/14/2022   Diarrhea 01/27/2019   Change in bowel habits 01/05/2019   Left inguinal hernia 06/27/2014   Bloating 05/17/2013   Constipation 05/17/2013   Pneumothorax 10/17/2008   HIATAL HERNIA 10/17/2008   DIVERTICULOSIS, COLON 10/17/2008   ENDOMETRIOSIS 10/17/2008   Right lower quadrant abdominal pain 10/17/2008   DEPRESSION, HX OF 10/17/2008   History of colonic polyps 10/17/2008   PNEUMOTHORAX 10/17/2008    PCP: Merri Brunette, MD   REFERRING PROVIDER: Rachel Moulds, MD  REFERRING DIAG: C50.412,Z17.1 (ICD-10-CM) - Malignant neoplasm of upper-outer quadrant of left breast in female, estrogen receptor negative (HCC) ,  L axillary cording  THERAPY DIAG:  Lymphedema, not elsewhere classified  Malignant neoplasm of upper-outer quadrant of left breast in female, estrogen receptor negative (HCC)  ONSET DATE: 09/01/23  Rationale for Evaluation and Treatment: Rehabilitation  SUBJECTIVE:                                                                                                                                                                                           SUBJECTIVE STATEMENT: My arm is bad. It is more swollen, my breast is more swollen and I have swelling at my clavicle. It is red and my armpit hurts.  PERTINENT HISTORY: Patient  was diagnosed on 08/15/2023 with left grade 3 invasive ductal carcinoma breast cancer. She underwent a left lumpectomy and sentinel node biopsy (13 negative nodes) on 07/29/2023. It is triple negative with a Ki67 of 95%. She has a history of a cervical fusion in 08/2022. Plans to start radiation soon.   PAIN:  Are you having pain? Yes, L axillary pain, not rated today  PRECAUTIONS: Other: L UE lymphedema risk, cervical fusion C6, C7  RED FLAGS: Compression fracture: Yes: T12    WEIGHT BEARING RESTRICTIONS: No  FALLS:  Has patient fallen in last 6 months? No  LIVING ENVIRONMENT: Lives with: lives with their spouse Lives in: House/apartment Stairs: Yes; External: 5 steps; can reach both Has following equipment at home: None  OCCUPATION: locksmith  LEISURE: was walking until last week but recently stopped due to stomach issues  HAND DOMINANCE: right   PRIOR LEVEL OF FUNCTION: Independent  PATIENT GOALS: to get my arm back to normal   OBJECTIVE:  COGNITION: Overall cognitive status: Within functional limits for tasks assessed   PALPATION: Cording palpable in L axilla, increased edema noticeable in LUE and L breast  OBSERVATIONS / OTHER ASSESSMENTS: L breast more full than R especially at lateral breast  POSTURE: forward head and rounded shoulders  UPPER EXTREMITY AROM/PROM:  A/PROM RIGHT   eval   Shoulder extension 70  Shoulder flexion 167  Shoulder abduction 166  Shoulder internal rotation 65  Shoulder external rotation 78    (Blank rows = not tested)  A/PROM LEFT   eval  Shoulder extension 65  Shoulder flexion 155  Shoulder abduction 160  Shoulder internal rotation 55  Shoulder external rotation 83    (Blank rows = not tested)    UPPER EXTREMITY STRENGTH:   LYMPHEDEMA ASSESSMENTS:   SURGERY TYPE/DATE: 07/29/23 L lumpectomy and SLNB  NUMBER OF LYMPH NODES REMOVED: 13  CHEMOTHERAPY: pt declined chemo  RADIATION: plans to begin radiation  soon  HORMONE TREATMENT: none  INFECTIONS: none  LYMPHEDEMA ASSESSMENTS:   LANDMARK RIGHT  eval  At axilla  35.5  15 cm proximal to olecranon process 30.8  10 cm proximal to olecranon process 30  Olecranon process 26.5  15 cm proximal to ulnar styloid process 23.9  10 cm proximal to ulnar styloid process 20.4  Just proximal to ulnar styloid process 17.4  Across hand at thumb web space 19.5  At base of 2nd digit 6.5  (Blank rows = not tested)  LANDMARK LEFT  eval  At axilla  35.9  15 cm proximal to olecranon process 32.5  10 cm proximal to olecranon process 31.5  Olecranon process 28  15 cm proximal to ulnar styloid process 24.5  10 cm proximal to ulnar styloid process 20.6  Just proximal to ulnar styloid process 18.1  Across hand at thumb web space 20.6  At base of 2nd digit 6.4  (Blank rows = not tested)    QUICK DASH SURVEY:       The patient was assessed using the L-Dex machine today to produce a lymphedema index baseline score. The patient will be reassessed on a regular basis (typically every 3 months) to obtain new L-Dex scores. If the score is > 6.5 points away from his/her baseline score indicating onset of subclinical lymphedema, it will be recommended to wear a compression garment for 4 weeks, 12 hours per day and then be reassessed. If the score continues to be > 6.5 points from baseline at reassessment, we will initiate lymphedema treatment. Assessing in this manner has a 95% rate of preventing clinically significant lymphedema.   TODAY'S TREATMENT:                                                                                                                                          DATE: 10/01/23 See assessment   09/29/23: Manual therapy Continued to instruct pt in proper technqiue and answered her questions regarding breast MLD having her return demo each step and cuing offered throughout to adjust for lighter pressure and direction of skin stretch:  Manual lymph drainage in supine as follows: short neck, 5 diaphragmatic breaths; right axillary nodes, anterior inter-axillary anastomoses, left inguinal nodes, left axillo-inguinal anastomoses, then left breast, left upper extremity working from proximal to distal down to fingers and dorsal hand and back to lateral shoulder redirecting along pathways.  MFR gently to L axilla where there is a thick cord and to antecubital fossa over area of cording, this less palpable today  09/24/23: Manual therapy Instructed pt in following today having her return demo of each step and cuing offered throughout to adjust for lighter pressure: Manual lymph drainage in supine as follows: short neck, 5 diaphragmatic breaths; right axillary nodes, anterior inter-axillary anastomoses, left inguinal nodes, left axillo-inguinal anastomoses, then left breast, left upper extremity working from proximal to distal down to fingers and dorsal hand and back to lateral shoulder  redirecting along pathways.  P/ROM to Lt shoulder into flex, abd and D2 gently to pts tolerance and with scapular depression throughout by therapist; multiple VC's throughout to relax due to guarding but less so today MFR gently to medial Lt upper arm and antecubital fossa over area of cording, this less palpable today  09/22/23: Manual therapy Manual lymph drainage in supine as follows: short neck, superficial and deep abdominals; right axillary nodes, right anterior intact thorax, anterior inter-axillary anastomoses, left inguinal nodes, left axillo-inguinal anastomoses, then left breast, left upper extremity from fingers and dorsal hand to lateral shoulder redirecting along pathways.  P/ROM to Lt shoulder into flex, abd and D2 gently to pts tolerance and with scapular depression throughout by therapist; multiple VC's throughout to relax due to guarding but less so today MFR gently to medial Lt upper arm and antecubital fossa over area of cording, this less  palpable today   09/18/23: Manual therapy Manual lymph drainage in supine as follows: short neck, superficial and deep abdominals; right axillary nodes, right anterior intact thorax, anterior inter-axillary anastomoses, left inguinal nodes, left axillo-inguinal anastomoses, then left breast, left upper extremity from fingers and dorsal hand to lateral shoulder redirecting along pathways.  P/ROM to Lt shoulder into flex, abd and D2 gently to pts tolerance and with scapular depression throughout by therapist; multiple VC's throughout to relax due to guarding.  MFR gently to medial Lt upper arm and antecubital fossa over area of cording; one small pop felt by pt and therapist at end of session over antecubital fossa STM in Rt S/L to Lt lateral trunk with cocoa butter Scap Mobs in Rt S/L to Lt scapula into protraction and retraction Cut and issued TG soft size medium for pt to wear in lieu of her compression sleeve until her new one arrives.   09/16/23: Measured pt for a new compression sleeve since the one given last session is too short and she is having swelling in her hand. She fit in to an off the shelf Sigvaris Secure size M2 (long) and medium glove - issued info for pt to obtain this online Pulleys x 2 min in direction of flexion and 2 min in direction of abduction MFR throughout LUE to help decrease cording with numerous cords palpable and a large thick cord palpable in L axilla Began Manual lymph drainage in supine as follows: short neck, right axillary nodes, left inguinal nodes, superficial and deep abdominals; anterior inter-axillary anastamoses, left axillo-inguinal anastamoses. Left upper extremity from fingers and dorsal hand to lateral shoulder redirecting along pathways. Instructed pt throughout in sequence, skin stretch and anatomy and physiology of the lymphatic system.    09/09/23: Pulleys x 2 min in direction of flexion and 2 min in direction of abduction with pt returning therapist  demo and feeling less discomfort at end of pulleys Measured pt for compression sleeve after increased Ldex score: Medi Harmony Size 3 EW and issued this to her    PATIENT EDUCATION:  Education details: Self MLD Person educated: Patient Education method: Medical illustrator, handout given Education comprehension: verbalized understanding and returned demonstration, will benefit from further review  HOME EXERCISE PROGRAM: Continue post op exercises Over the door pulleys Wear compression sleeve 12 hours a day for 4 weeks 09/24/23: Self MLD  ASSESSMENT:  CLINICAL IMPRESSION: Pt arrived with increased swelling present throughout L breast, L UE, and L clavicular area. There was increased redness and warmth. Pt reports increased pain. Discussed signs of cellulitis and educated pt that  she should be seen by her oncologist to rule out infection. Therapist contacted symptoms management clinic and she will be seen this afternoon. Educated pt to hold off on any MLD and not to wear her sleeve for now.    OBJECTIVE IMPAIRMENTS: decreased knowledge of condition, decreased knowledge of use of DME, decreased ROM, increased edema, increased fascial restrictions, impaired UE functional use, postural dysfunction, and pain.   ACTIVITY LIMITATIONS: carrying, lifting, and reach over head  PARTICIPATION LIMITATIONS: meal prep, cleaning, laundry, and community activity  PERSONAL FACTORS:  none  are also affecting patient's functional outcome.   REHAB POTENTIAL: Good  CLINICAL DECISION MAKING: Stable/uncomplicated  EVALUATION COMPLEXITY: Low  GOALS: Goals reviewed with patient? Yes  SHORT TERM GOALS=LONG TERM GOALS Target date: 10/07/23  Pt will be independent with self MLD for L UE and L breast for long term management of lymphedema. Baseline: Goal status: INITIAL  2.  Pt will return to the green on SOZO demonstrating reversal of subclinical lymphedema. Baseline:  Goal status:  INITIAL  3.  Pt will demonstrate 165 degrees of L shoulder flexion to allow her to reach overhead.  Baseline:  Goal status: INITIAL  4.  Pt will be independent in a home exercise program for continued stretching and strengthening.  Baseline:  Goal status: INITIAL  5.  Pt will report a 75% improvement in pain and discomfort from axillary cording to allow improved comfort.  Baseline:  Goal status: INITIAL  6.  Pt will obtain appropriate compression garments - sleeve and bra for long term management.  Baseline:  Goal status: INITIAL  PLAN:  PT FREQUENCY: 2x/week  PT DURATION: 4 weeks  PLANNED INTERVENTIONS: Therapeutic exercises, Therapeutic activity, Patient/Family education, Self Care, Joint mobilization, Orthotic/Fit training, Manual lymph drainage, Compression bandaging, scar mobilization, Taping, Vasopneumatic device, Manual therapy, and Re-evaluation  PLAN FOR NEXT SESSION: what did dr say?, cont and review MLD to L breast and LUE, MFR to cording in L axilla, did she get new sleeve? Assess fit  Leonette Most, PT 10/01/2023, 12:28 PM   Hug yourself.  Do circles at your neck just above your collarbones.  Repeat this 10 times.  Diaphragmatic - Supine   Inhale through nose making navel move out toward hands. Exhale through puckered lips, hands follow navel in. Repeat _5__ times. Rest _10__ seconds between repeats.    Axilla to Axilla - Sweep   On uninvolved side make 5 circles in the armpit, then pump _5__ times from involved armpit across chest to uninvolved armpit, making a pathway. Do _1__ time per day.  Copyright  [employer]. All rights reserved.  Axilla to Inguinal Nodes - Sweep   On involved side, make 5 circles at groin at panty line, then pump _5__ times from armpit along side of trunk to outer hip, making your other pathway. Do __1_ time per day.  BREAST SEQUENCE: Draw an imaginary diagonal line from upper outer breast through the nipple area toward  lower inner breast.  Direct fluid upward and inward from this line toward the pathway across your upper chest .  Do this in three rows to treat all of the upper inner breast tissue, and do each row 3-4x.      Direct fluid to treat all of lower outer breast tissue downward and outward toward pathway that is aimed at the left groin.   Arm Posterior: Elbow to Shoulder - Sweep   Pump _5__ times from back of elbow to top of shoulder. Then inner to  outer upper arm _5_ times, then outer arm again _5_ times. Then back to the pathways _2-3_ times. Do _1__ time per day.  Copyright  [employer]HI. All rights reserved.  ARM: Volar Wrist to Elbow - Sweep   Pump or stationary circles _5__ times from wrist to elbow making sure to do both sides of the forearm. Then retrace your steps to the outer arm, and the pathways _2-3_ times each. Do _1__ time per day.  Copyright  [employer]. All rights reserved.  ARM: Dorsum of Hand to Shoulder - Sweep   Pump or stationary circles _5__ times on back of hand including knuckle spaces and individual fingers if needed working up towards the wrist, then retrace all your steps working back up the forearm, doing both sides; upper outer arm and back to your pathways _2-3_ times each. Then do 5 circles again at uninvolved armpit and involved groin where you started! Good job!! Do __1_ time per day.  Cancer Rehab [phone]

## 2023-10-01 NOTE — Progress Notes (Signed)
Symptom Management Consult Note [facility]    Patient Care Team: Merri Brunette, MD as PCP - General (Internal Medicine) Rachel Moulds, MD as Consulting Physician (Hematology and Oncology) Harriette Bouillon, MD as Consulting Physician (General Surgery) Antony Blackbird, MD as Consulting Physician (Radiation Oncology) Donnelly Angelica, RN as Oncology Nurse Navigator Pershing Proud, RN as Oncology Nurse Navigator    Name / MRN / DOB: [name] [name]  [id number]  [birth year]   Date of visit: 10/01/2023   Chief Complaint/Reason for visit: arm swelling and breast redness   Current Therapy: Radiation    ASSESSMENT & PLAN: Patient is a 72 y.o. female with oncologic history of Malignant neoplasm of upper-outer quadrant of left breast in female, estrogen receptor negativefollowed by Dr. Al Pimple.  I have viewed most recent oncology note and lab work.    #Malignant neoplasm of upper-outer quadrant of left breast in female, estrogen receptor negative  -Started radiation 09/25/23. - Next appointment with oncologist is 11/11/23   #Breast erythema with left arm swelling -Patient well-appearing.  Afebrile, hemodynamically stable. -Physical exam reveals erythema of left breast extending to left arm.  No palpable mass.  No open wounds.  Also with minimal swelling to left arm compared to the right.  Strong radial pulse. -CBC overall unremarkable.  Will cover for possible cellulitis with a course of Keflex.  Will also get an ultrasound to rule out DVT with the acute swelling. -If no improvement after course of antibiotics and patient remains afebrile would consider this to be possible irritation from radiation therapy.     Strict ED precautions discussed should symptoms worsen.   Heme/Onc History: Oncology History  Malignant neoplasm of upper-outer quadrant of left breast in female, estrogen receptor negative (HCC)  06/03/2023 Mammogram   Screening mammogram bilaterally showed  possible mass in the left breast warranting further evaluation.  Diagnostic mammogram confirmed highly suspicious 2.2 cm upper outer left breast mass, no abnormal appearing left axillary lymph nodes   06/23/2023 Pathology Results   Pathology results from the left breast needle core biopsy showed grade 3 invasive poorly differentiated ductal carcinoma, focal high-grade DCIS, cribriform type, negative for angiolymphatic invasion, negative for micro calcs, prognostic showed ER 0% negative, PR 0% negative, HER2 negative and Ki-67 of 95%.   06/30/2023 Initial Diagnosis   Malignant neoplasm of upper-outer quadrant of left breast in female, estrogen receptor negative (HCC)    Genetic Testing   [facility] Custom Panel+RNA was Negative. Of note, a variant of uncertain significance was detected in the MSH3 gene (c.1027+4T>C (Intronic)). Report date is 07/10/2023.  The Custom Hereditary Cancers Panel offered by [employer] includes sequencing and/or deletion duplication testing of the following 46 genes: APC, ATM, AXIN2, BAP1, BARD1, BMPR1A, BRCA1, BRCA2, BRIP1, CDH1, CDK4, CDKN2A (p14ARF and p16INK4a only), CHEK2, CTNNA1, DICER1, EPCAM (Deletion/duplication testing only), FH, GREM1 (promoter region duplication testing only), HOXB13, KIT, MBD4, MEN1, MLH1, MSH2, MSH3, MSH6, MUTYH, NF1, NHTL1, PALB2, PDGFRA, PMS2, POLD1, POLE, PRKAR1A, PTEN, RAD51C, RAD51D, RET, SMAD4, SMARCA4. STK11, TP53, TSC1, TSC2, and VHL.        Interval history-: [name] CLERE is a 72 y.o. female with oncologic history as above presenting to [facility] today with chief complaint of left breast redness and left arm swelling.  She is accompanied by her spouse who provides additional history.  Patient states she began radiation for her breast cancer x 6 days ago.  That evening she noticed that her breast and underside of her left arm  were red.  She states over the next several days of redness continued to band.  She also noticed some swelling in her left  arm that has come more obvious today.  Patient was at her physical therapy appointment for lymphedema today and therapist was concerned for breast infection so it was recommended she seek evaluation with oncologist.  Patient denies any injury to her left arm.  She has tenderness in her left underarm that is mild she reports.  No over-the-counter medications taken prior to arrival.      ROS  All other systems are reviewed and are negative for acute change except as noted in the HPI.    Allergies  Allergen Reactions   Bupropion Other (See Comments)    headaches   Cefaclor Diarrhea    severe diarrhea   Elemental Sulfur Other (See Comments)    Thrush   Sulfa Antibiotics Other (See Comments)    thrush   Latex Rash and Other (See Comments)    blister    Ofloxacin Diarrhea     Past Medical History:  Diagnosis Date   Adenomatous colon polyp    Allergy    Anxiety    Arthritis    bilateral hands. lower back   Cancer [facility])    skin   Cataract    bilateral   COPD (chronic obstructive pulmonary disease) (HCC)    Depression    Diverticulosis    Endometriosis    GERD (gastroesophageal reflux disease)    Glaucoma    Headache    Migraines   Hiatal hernia    History of left breast cancer 05/2023   IBS (irritable bowel syndrome)    Inguinal hernia    Pneumothorax 1987   PONV (postoperative nausea and vomiting)      Past Surgical History:  Procedure Laterality Date   ABDOMINAL HYSTERECTOMY  1992   ANTERIOR CERVICAL DECOMP/DISCECTOMY FUSION N/A 09/23/2022   Procedure: Anterior Decompression Fusion,PLATE/SCREWS Cervical five-six; REMOVAL OLD PLATE;  Surgeon: Tressie Stalker, MD;  Location: [facility] OR;  Service: Neurosurgery;  Laterality: N/A;   APPENDECTOMY  1992   BACK SURGERY     cervical   BREAST BIOPSY Left 06/23/2023   [place] LT BREAST BX W LOC DEV 1ST LESION IMG BX SPEC US GUIDE 06/23/2023 [facility] MAMMOGRAPHY   BREAST BIOPSY Left 07/28/2023   [place] LT RADIOACTIVE SEED LOC  07/28/2023 [facility] MAMMOGRAPHY   BREAST LUMPECTOMY WITH RADIOACTIVE SEED AND SENTINEL LYMPH NODE BIOPSY Left 07/29/2023   Procedure: LEFT BREAST SEED LUMPECTOMY WITH LEFT SENTINEL LYMPH NODE MAPPING;  Surgeon: Harriette Bouillon, MD;  Location: [facility] OR;  Service: General;  Laterality: Left;  PEC BLOCK   CATARACT EXTRACTION Bilateral    2021, 2022   COLONOSCOPY  2023   PLEURAL SCARIFICATION     ruptured disk     torn tendon     right arm   TUBAL LIGATION      Social History   Socioeconomic History   Marital status: Married    Spouse name: Not on file   Number of children: 2   Years of education: Not on file   Highest education level: Not on file  Occupational History   Occupation: locksmith  Tobacco Use   Smoking status: Former    Current packs/day: 0.00    Average packs/day: 1.5 packs/day for 40.1 years (60.2 ttl pk-yrs)    Types: Cigarettes    Start date: 12/31/1971    Quit date: 02/10/2012    Years since quitting: 11.6  Smokeless tobacco: Never  Vaping Use   Vaping status: Never Used  Substance and Sexual Activity   Alcohol use: No   Drug use: No   Sexual activity: Never  Other Topics Concern   Not on file  Social History Narrative   Not on file   Social Determinants of Health   Financial Resource Strain: Not on file  Food Insecurity: No Food Insecurity (09/15/2023)   Hunger Vital Sign    Worried About Running Out of Food in the Last Year: Never true    Ran Out of Food in the Last Year: Never true  Transportation Needs: No Transportation Needs (09/15/2023)   PRAPARE - Administrator, Civil Service (Medical): No    Lack of Transportation (Non-Medical): No  Physical Activity: Not on file  Stress: Not on file  Social Connections: Unknown (05/12/2022)   Received from [facility]   Social Network    Social Network: Not on file  Intimate Partner Violence: Not At Risk (09/15/2023)   Humiliation, Afraid, Rape, and Kick questionnaire    Fear of  Current or Ex-Partner: No    Emotionally Abused: No    Physically Abused: No    Sexually Abused: No    Family History  Problem Relation Age of Onset   Heart failure Mother    Crohn's disease Sister    Osteoporosis Sister    Neuropathy Sister    Colon polyps Sister    Lung cancer Sister 68       smoked   Thyroid disease Sister    Thyroid cancer Maternal Grandmother    Colon cancer Neg Hx    Breast cancer Neg Hx    Esophageal cancer Neg Hx    Pancreatic cancer Neg Hx    Stomach cancer Neg Hx      Current Outpatient Medications:    cephALEXin (KEFLEX) 500 MG capsule, Take 1 capsule (500 mg total) by mouth 2 (two) times daily for 7 days., Disp: 14 capsule, Rfl: 0   albuterol (VENTOLIN HFA) 108 (90 Base) MCG/ACT inhaler, Inhale 2 puffs into the lungs every 6 (six) hours as needed., Disp: 18 g, Rfl: 5   ALPRAZolam (XANAX) 0.25 MG tablet, Take 0.25 mg by mouth 3 (three) times daily as needed for anxiety., Disp: , Rfl:    aspirin EC 81 MG tablet, Take 81 mg by mouth in the morning. Swallow whole., Disp: , Rfl:    Bacillus Coagulans-Inulin (PROBIOTIC) 1-250 BILLION-MG CAPS, Take 1 capsule by mouth every evening., Disp: , Rfl:    cetirizine (ZYRTEC) 10 MG tablet, Take 1 tablet (10 mg total) by mouth daily., Disp: 30 tablet, Rfl: 11   Cholecalciferol (VITAMIN D PO), Take 5,000 Units by mouth in the morning., Disp: , Rfl:    denosumab (PROLIA) 60 MG/ML SOSY injection, Inject 60 mg into the skin every 6 (six) months., Disp: , Rfl:    famotidine (PEPCID) 20 MG tablet, Take 1 tablet (20 mg total) by mouth 2 (two) times daily. As needed (Patient taking differently: Take 20 mg by mouth daily as needed for heartburn or indigestion.), Disp: 60 tablet, Rfl: 6   fluticasone (FLONASE) 50 MCG/ACT nasal spray, Place into both nostrils., Disp: , Rfl:    inclisiran (LEQVIO) 284 MG/1.5ML SOSY injection, Inject 284 mg into the skin every 6 (six) months. Every three months, Disp: , Rfl:    loperamide  (IMODIUM) 2 MG capsule, Take 2 mg by mouth as needed for diarrhea or loose  stools., Disp: , Rfl:    MELATONIN-CHAMOMILE PO, Take 1 tablet by mouth at bedtime., Disp: , Rfl:    promethazine (PHENERGAN) 12.5 MG suppository, Place 12.5 mg rectally every 6 (six) hours as needed for nausea or vomiting., Disp: , Rfl:    sertraline (ZOLOFT) 100 MG tablet, Take 50 mg by mouth in the morning., Disp: , Rfl:   PHYSICAL EXAM: ECOG FS:1 - Symptomatic but completely ambulatory    Vitals:   10/01/23 1407  BP: (!) 152/83  Pulse: 64  Resp: 16  Temp: 97.8 F (36.6 C)  TempSrc: Oral  SpO2: 96%  Weight: 220 lb (99.8 kg)   Physical Exam Vitals and nursing note reviewed. Exam conducted with a chaperone present (Exam chaperoned by spouse).  Constitutional:      Appearance: She is not ill-appearing or toxic-appearing.  HENT:     Head: Normocephalic.  Eyes:     Conjunctiva/sclera: Conjunctivae normal.  Cardiovascular:     Rate and Rhythm: Normal rate and regular rhythm.     Pulses: Normal pulses.          Radial pulses are 2+ on the left side.  Pulmonary:     Effort: Pulmonary effort is normal.  Chest:     Comments: Left breast with faint erythema.  No palpable masses.  No breast tenderness.  Incisions on left breast well-healed.  Please see media below. Abdominal:     General: There is no distension.  Musculoskeletal:     Cervical back: Normal range of motion.     Right lower leg: No edema.     Left lower leg: No edema.     Comments: Faint erythema to underside of left upper arm.  Compartments soft in left upper extremity.  Minimal edema noted to left forearm. Full ROM of left upper extremity.  Skin:    General: Skin is warm and dry.  Neurological:     Mental Status: She is alert.        LABORATORY DATA: I have reviewed the data as listed    Latest Ref Rng & Units 10/01/2023    1:40 PM 09/08/2023   10:44 AM 08/15/2023   11:37 AM  CBC  WBC 4.0 - 10.5 K/uL 7.6  7.4  7.6    Hemoglobin 12.0 - 15.0 g/dL 86.5  78.4  69.6   Hematocrit 36.0 - 46.0 % 40.9  44.9  43.1   Platelets 150 - 400 K/uL 258  240  266.0         Latest Ref Rng & Units 10/01/2023    1:40 PM 09/08/2023   10:44 AM 08/15/2023   11:37 AM  CMP  Glucose 70 - 99 mg/dL 295  284  132   BUN 8 - 23 mg/dL 17  17  16    Creatinine 0.44 - 1.00 mg/dL 4.40  1.02  7.25   Sodium 135 - 145 mmol/L 141  141  138   Potassium 3.5 - 5.1 mmol/L 4.7  4.9  4.1   Chloride 98 - 111 mmol/L 108  108  104   CO2 22 - 32 mmol/L 28  28  26    Calcium 8.9 - 10.3 mg/dL 9.2  9.3  9.4   Total Protein 6.5 - 8.1 g/dL 7.0  7.4  6.9   Total Bilirubin 0.3 - 1.2 mg/dL 0.3  0.4  0.3   Alkaline Phos 38 - 126 U/L 73  72  78   AST 15 - 41 U/L 13  13  12   ALT 0 - 44 U/L 10  10  10         RADIOGRAPHIC STUDIES (from last 24 hours if applicable) I have personally reviewed the radiological images as listed and agreed with the findings in the report. No results found.      Visit Diagnosis: 1. Left arm swelling   2. Malignant neoplasm of upper-outer quadrant of left breast in female, estrogen receptor negative (HCC)   3. Breast erythema      No orders of the defined types were placed in this encounter.   All questions were answered. The patient knows to call the clinic with any problems, questions or concerns. No barriers to learning was detected.  A total of more than 30 minutes were spent on this encounter with face-to-face time and non-face-to-face time, including preparing to see the patient, ordering tests and/or medications, counseling the patient and coordination of care as outlined above.    Thank you for allowing me to participate in the care of this patient.    Shanon Ace, PA-C Department of Hematology/Oncology [facility] at [facility] Phone: [phone]  [phone]    10/01/2023 4:28 PM

## 2023-10-01 NOTE — Telephone Encounter (Signed)
[facility] RN received call from [facility], patient's PT, who reports possible cellulitis to patient's L breast and arm. She states patient complained of tenderness which started Friday. She noted that the patient had redness and swelling on Monday but the discoloration has since spread significantly. Denies fevers or chills at this time. Lab appointment made for 1:30 today, [facility] appointment at 2.

## 2023-10-02 ENCOUNTER — Other Ambulatory Visit: Payer: Self-pay | Admitting: Gastroenterology

## 2023-10-02 ENCOUNTER — Ambulatory Visit (HOSPITAL_COMMUNITY)
Admission: RE | Admit: 2023-10-02 | Discharge: 2023-10-02 | Disposition: A | Discharge status: Home / Self Care | Payer: Medicare Other | Source: Ambulatory Visit | Attending: Physician Assistant | Admitting: Physician Assistant

## 2023-10-02 ENCOUNTER — Telehealth: Payer: Self-pay | Admitting: Physician Assistant

## 2023-10-02 ENCOUNTER — Ambulatory Visit
Admission: RE | Admit: 2023-10-02 | Discharge: 2023-10-02 | Disposition: A | Discharge status: Home / Self Care | Payer: Medicare Other | Source: Ambulatory Visit | Attending: Radiation Oncology | Admitting: Radiation Oncology

## 2023-10-02 ENCOUNTER — Other Ambulatory Visit: Payer: Self-pay

## 2023-10-02 DIAGNOSIS — M7989 Other specified soft tissue disorders: Secondary | ICD-10-CM | POA: Insufficient documentation

## 2023-10-02 DIAGNOSIS — Z510 Encounter for antineoplastic radiation therapy: Secondary | ICD-10-CM | POA: Diagnosis not present

## 2023-10-02 DIAGNOSIS — Z171 Estrogen receptor negative status [ER-]: Secondary | ICD-10-CM | POA: Diagnosis not present

## 2023-10-02 DIAGNOSIS — C50412 Malignant neoplasm of upper-outer quadrant of left female breast: Secondary | ICD-10-CM | POA: Diagnosis not present

## 2023-10-02 LAB — RAD ONC ARIA SESSION SUMMARY
Course Elapsed Days: 7
Plan Fractions Treated to Date: 6
Plan Prescribed Dose Per Fraction: 2.67 Gy
Plan Total Fractions Prescribed: 16
Plan Total Prescribed Dose: 42.72 Gy
Reference Point Dosage Given to Date: 16.02 Gy
Reference Point Session Dosage Given: 2.67 Gy
Session Number: 6

## 2023-10-02 NOTE — Progress Notes (Signed)
Upper extremity venous duplex completed. Please see CV Procedures for preliminary results.  Shona Simpson, RVT 10/02/23 1:36 PM

## 2023-10-02 NOTE — Telephone Encounter (Signed)
I notified [name] [name] by phone regarding [place] result. [place] is negative for DVT.  Technician does comment on avascular mass in antecubital fossa.    Patient mentioned the swelling started after she wore compression sleeve on her left upper extremity.  She has no pain to the area.  She has not had recent IVs in that area either.  Based on this this could be an inflamed ligament or MSK related problem.  I encouraged patient to apply warm compress to the area and if anything worsens or she develops pain I recommended she follow-up with orthopedics or primary care provider for further evaluation. All of patient's questions were answered and she expressed understanding of the plan provided.

## 2023-10-03 ENCOUNTER — Other Ambulatory Visit: Payer: Self-pay

## 2023-10-03 ENCOUNTER — Ambulatory Visit
Admission: RE | Admit: 2023-10-03 | Discharge: 2023-10-03 | Disposition: A | Discharge status: Home / Self Care | Payer: Medicare Other | Source: Ambulatory Visit | Attending: Radiation Oncology | Admitting: Radiation Oncology

## 2023-10-03 DIAGNOSIS — Z171 Estrogen receptor negative status [ER-]: Secondary | ICD-10-CM | POA: Diagnosis not present

## 2023-10-03 DIAGNOSIS — C50412 Malignant neoplasm of upper-outer quadrant of left female breast: Secondary | ICD-10-CM | POA: Diagnosis not present

## 2023-10-03 DIAGNOSIS — Z510 Encounter for antineoplastic radiation therapy: Secondary | ICD-10-CM | POA: Diagnosis not present

## 2023-10-03 LAB — RAD ONC ARIA SESSION SUMMARY
Course Elapsed Days: 8
Plan Fractions Treated to Date: 7
Plan Prescribed Dose Per Fraction: 2.67 Gy
Plan Total Fractions Prescribed: 16
Plan Total Prescribed Dose: 42.72 Gy
Reference Point Dosage Given to Date: 18.69 Gy
Reference Point Session Dosage Given: 2.67 Gy
Session Number: 7

## 2023-10-06 ENCOUNTER — Ambulatory Visit
Admission: RE | Admit: 2023-10-06 | Discharge: 2023-10-06 | Disposition: A | Discharge status: Home / Self Care | Payer: Medicare Other | Source: Ambulatory Visit | Attending: Radiation Oncology | Admitting: Radiation Oncology

## 2023-10-06 ENCOUNTER — Ambulatory Visit: Payer: Medicare Other | Admitting: Physical Therapy

## 2023-10-06 ENCOUNTER — Other Ambulatory Visit: Payer: Self-pay

## 2023-10-06 ENCOUNTER — Encounter: Payer: Self-pay | Admitting: Physical Therapy

## 2023-10-06 DIAGNOSIS — R293 Abnormal posture: Secondary | ICD-10-CM | POA: Diagnosis not present

## 2023-10-06 DIAGNOSIS — Z171 Estrogen receptor negative status [ER-]: Secondary | ICD-10-CM

## 2023-10-06 DIAGNOSIS — Z483 Aftercare following surgery for neoplasm: Secondary | ICD-10-CM | POA: Diagnosis not present

## 2023-10-06 DIAGNOSIS — C50412 Malignant neoplasm of upper-outer quadrant of left female breast: Secondary | ICD-10-CM | POA: Diagnosis not present

## 2023-10-06 DIAGNOSIS — I890 Lymphedema, not elsewhere classified: Secondary | ICD-10-CM | POA: Diagnosis not present

## 2023-10-06 DIAGNOSIS — Z510 Encounter for antineoplastic radiation therapy: Secondary | ICD-10-CM | POA: Diagnosis not present

## 2023-10-06 DIAGNOSIS — M25612 Stiffness of left shoulder, not elsewhere classified: Secondary | ICD-10-CM | POA: Diagnosis not present

## 2023-10-06 DIAGNOSIS — R600 Localized edema: Secondary | ICD-10-CM | POA: Diagnosis not present

## 2023-10-06 LAB — RAD ONC ARIA SESSION SUMMARY
Course Elapsed Days: 11
Plan Fractions Treated to Date: 8
Plan Prescribed Dose Per Fraction: 2.67 Gy
Plan Total Fractions Prescribed: 16
Plan Total Prescribed Dose: 42.72 Gy
Reference Point Dosage Given to Date: 21.36 Gy
Reference Point Session Dosage Given: 2.67 Gy
Session Number: 8

## 2023-10-06 NOTE — Therapy (Signed)
OUTPATIENT PHYSICAL THERAPY  UPPER EXTREMITY ONCOLOGY TREATMENT  Patient Name: [name] [name] MRN: [id number] DOB:09/17/1951, 72 y.o., female Today's Date: 10/06/2023  END OF SESSION:  PT End of Session - 10/06/23 1259     Visit Number 7    Number of Visits 9    Date for PT Re-Evaluation 10/07/23    PT Start Time 1205    PT Stop Time 1258    PT Time Calculation (min) 53 min    Activity Tolerance Patient tolerated treatment well    Behavior During Therapy WFL for tasks assessed/performed               Past Medical History:  Diagnosis Date   Adenomatous colon polyp    Allergy    Anxiety    Arthritis    bilateral hands. lower back   Cancer [facility])    skin   Cataract    bilateral   COPD (chronic obstructive pulmonary disease) (HCC)    Depression    Diverticulosis    Endometriosis    GERD (gastroesophageal reflux disease)    Glaucoma    Headache    Migraines   Hiatal hernia    History of left breast cancer 05/2023   IBS (irritable bowel syndrome)    Inguinal hernia    Pneumothorax 1987   PONV (postoperative nausea and vomiting)    Past Surgical History:  Procedure Laterality Date   ABDOMINAL HYSTERECTOMY  1992   ANTERIOR CERVICAL DECOMP/DISCECTOMY FUSION N/A 09/23/2022   Procedure: Anterior Decompression Fusion,PLATE/SCREWS Cervical five-six; REMOVAL OLD PLATE;  Surgeon: Tressie Stalker, MD;  Location: [facility] OR;  Service: Neurosurgery;  Laterality: N/A;   APPENDECTOMY  1992   BACK SURGERY     cervical   BREAST BIOPSY Left 06/23/2023   [place] LT BREAST BX W LOC DEV 1ST LESION IMG BX SPEC US GUIDE 06/23/2023 [facility]   BREAST BIOPSY Left 07/28/2023   [place] LT RADIOACTIVE SEED LOC 07/28/2023 [facility]   BREAST LUMPECTOMY WITH RADIOACTIVE SEED AND SENTINEL LYMPH NODE BIOPSY Left 07/29/2023   Procedure: LEFT BREAST SEED LUMPECTOMY WITH LEFT SENTINEL LYMPH NODE MAPPING;  Surgeon: Harriette Bouillon, MD;  Location: [facility] OR;  Service: General;  Laterality: Left;   PEC BLOCK   CATARACT EXTRACTION Bilateral    2021, 2022   COLONOSCOPY  2023   PLEURAL SCARIFICATION     ruptured disk     torn tendon     right arm   TUBAL LIGATION     Patient Active Problem List   Diagnosis Date Noted   Genetic testing 07/11/2023   Malignant neoplasm of upper-outer quadrant of left breast in female, estrogen receptor negative (HCC) 06/30/2023   Cervical spondylosis with radiculopathy 09/23/2022   Elevated low-density lipoprotein level 06/14/2022   Coronary arteriosclerosis 06/14/2022   Adverse reaction to antihyperlipidemic drug, initial encounter 06/14/2022   Diarrhea 01/27/2019   Change in bowel habits 01/05/2019   Left inguinal hernia 06/27/2014   Bloating 05/17/2013   Constipation 05/17/2013   Pneumothorax 10/17/2008   HIATAL HERNIA 10/17/2008   DIVERTICULOSIS, COLON 10/17/2008   ENDOMETRIOSIS 10/17/2008   Right lower quadrant abdominal pain 10/17/2008   DEPRESSION, HX OF 10/17/2008   History of colonic polyps 10/17/2008   PNEUMOTHORAX 10/17/2008    PCP: Merri Brunette, MD   REFERRING PROVIDER: Rachel Moulds, MD  REFERRING DIAG: C50.412,Z17.1 (ICD-10-CM) - Malignant neoplasm of upper-outer quadrant of left breast in female, estrogen receptor negative (HCC) , L axillary cording  THERAPY DIAG:  Lymphedema,  not elsewhere classified  Malignant neoplasm of upper-outer quadrant of left breast in female, estrogen receptor negative (HCC)  ONSET DATE: 09/01/23  Rationale for Evaluation and Treatment: Rehabilitation  SUBJECTIVE:                                                                                                                                                                                           SUBJECTIVE STATEMENT: The redness has improved but I am still very tender to the touch above my collar bone.   PERTINENT HISTORY: Patient was diagnosed on 08/15/2023 with left grade 3 invasive ductal carcinoma breast cancer. She underwent a left  lumpectomy and sentinel node biopsy (13 negative nodes) on 07/29/2023. It is triple negative with a Ki67 of 95%. She has a history of a cervical fusion in 08/2022. Plans to start radiation soon.   PAIN:  PAIN:  Are you having pain? Yes NPRS scale: 3/10 Pain location: L supraclavicular area Pain orientation: Left  PAIN TYPE: burning Pain description: constant  Aggravating factors: touching it Relieving factors: none reported   PRECAUTIONS: Other: L UE lymphedema risk, cervical fusion C6, C7  RED FLAGS: Compression fracture: Yes: T12    WEIGHT BEARING RESTRICTIONS: No  FALLS:  Has patient fallen in last 6 months? No  LIVING ENVIRONMENT: Lives with: lives with their spouse Lives in: House/apartment Stairs: Yes; External: 5 steps; can reach both Has following equipment at home: None  OCCUPATION: locksmith  LEISURE: was walking until last week but recently stopped due to stomach issues  HAND DOMINANCE: right   PRIOR LEVEL OF FUNCTION: Independent  PATIENT GOALS: to get my arm back to normal   OBJECTIVE:  COGNITION: Overall cognitive status: Within functional limits for tasks assessed   PALPATION: Cording palpable in L axilla, increased edema noticeable in LUE and L breast  OBSERVATIONS / OTHER ASSESSMENTS: L breast more full than R especially at lateral breast  POSTURE: forward head and rounded shoulders  UPPER EXTREMITY AROM/PROM:  A/PROM RIGHT   eval   Shoulder extension 70  Shoulder flexion 167  Shoulder abduction 166  Shoulder internal rotation 65  Shoulder external rotation 78    (Blank rows = not tested)  A/PROM LEFT   eval  Shoulder extension 65  Shoulder flexion 155  Shoulder abduction 160  Shoulder internal rotation 55  Shoulder external rotation 83    (Blank rows = not tested)    UPPER EXTREMITY STRENGTH:   LYMPHEDEMA ASSESSMENTS:   SURGERY TYPE/DATE: 07/29/23 L lumpectomy and SLNB  NUMBER OF LYMPH NODES REMOVED: 13  CHEMOTHERAPY: pt  declined chemo  RADIATION: plans to begin radiation soon  HORMONE TREATMENT: none  INFECTIONS: none   LYMPHEDEMA ASSESSMENTS:   LANDMARK RIGHT  eval  At axilla  35.5  15 cm proximal to olecranon process 30.8  10 cm proximal to olecranon process 30  Olecranon process 26.5  15 cm proximal to ulnar styloid process 23.9  10 cm proximal to ulnar styloid process 20.4  Just proximal to ulnar styloid process 17.4  Across hand at thumb web space 19.5  At base of 2nd digit 6.5  (Blank rows = not tested)  LANDMARK LEFT  eval  At axilla  35.9  15 cm proximal to olecranon process 32.5  10 cm proximal to olecranon process 31.5  Olecranon process 28  15 cm proximal to ulnar styloid process 24.5  10 cm proximal to ulnar styloid process 20.6  Just proximal to ulnar styloid process 18.1  Across hand at thumb web space 20.6  At base of 2nd digit 6.4  (Blank rows = not tested)    QUICK DASH SURVEY:       The patient was assessed using the L-Dex machine today to produce a lymphedema index baseline score. The patient will be reassessed on a regular basis (typically every 3 months) to obtain new L-Dex scores. If the score is > 6.5 points away from his/her baseline score indicating onset of subclinical lymphedema, it will be recommended to wear a compression garment for 4 weeks, 12 hours per day and then be reassessed. If the score continues to be > 6.5 points from baseline at reassessment, we will initiate lymphedema treatment. Assessing in this manner has a 95% rate of preventing clinically significant lymphedema.   TODAY'S TREATMENT:                                                                                                                                          DATE: 10/06/23 Discussed on going fullness at L superclavicular region. Had Dwaine Gale, PT also look and assess since this is not a typical sign with UE lymphedema. She did have a scan in Sept of her neck and no evidence  of lymphadenopathy was found at that time. Discussed with pt since she had 13 nodes removed it could be lymphedema since that area is an easy area for fluid to expand in to.  Continued Manual lymph drainage in supine as follows: short neck, right axillary nodes, left inguinal nodes, superficial and deep abdominals; anterior inter-axillary anastamoses, left axillo-inguinal anastamoses. Left upper extremity from fingers and dorsal hand to lateral shoulder redirecting along pathways. Spent extra time at L supraclavicular region and pt reported increased comfort by end of session. Encouraged her to try her sleeve when she gets home.    10/01/23 See assessment   09/29/23: Manual therapy Continued to instruct pt in proper technqiue and answered her questions regarding breast MLD having her return demo each step and cuing offered throughout to adjust for  lighter pressure and direction of skin stretch: Manual lymph drainage in supine as follows: short neck, 5 diaphragmatic breaths; right axillary nodes, anterior inter-axillary anastomoses, left inguinal nodes, left axillo-inguinal anastomoses, then left breast, left upper extremity working from proximal to distal down to fingers and dorsal hand and back to lateral shoulder redirecting along pathways.  MFR gently to L axilla where there is a thick cord and to antecubital fossa over area of cording, this less palpable today  09/24/23: Manual therapy Instructed pt in following today having her return demo of each step and cuing offered throughout to adjust for lighter pressure: Manual lymph drainage in supine as follows: short neck, 5 diaphragmatic breaths; right axillary nodes, anterior inter-axillary anastomoses, left inguinal nodes, left axillo-inguinal anastomoses, then left breast, left upper extremity working from proximal to distal down to fingers and dorsal hand and back to lateral shoulder redirecting along pathways.  P/ROM to Lt shoulder into flex, abd  and D2 gently to pts tolerance and with scapular depression throughout by therapist; multiple VC's throughout to relax due to guarding but less so today MFR gently to medial Lt upper arm and antecubital fossa over area of cording, this less palpable today  09/22/23: Manual therapy Manual lymph drainage in supine as follows: short neck, superficial and deep abdominals; right axillary nodes, right anterior intact thorax, anterior inter-axillary anastomoses, left inguinal nodes, left axillo-inguinal anastomoses, then left breast, left upper extremity from fingers and dorsal hand to lateral shoulder redirecting along pathways.  P/ROM to Lt shoulder into flex, abd and D2 gently to pts tolerance and with scapular depression throughout by therapist; multiple VC's throughout to relax due to guarding but less so today MFR gently to medial Lt upper arm and antecubital fossa over area of cording, this less palpable today   09/18/23: Manual therapy Manual lymph drainage in supine as follows: short neck, superficial and deep abdominals; right axillary nodes, right anterior intact thorax, anterior inter-axillary anastomoses, left inguinal nodes, left axillo-inguinal anastomoses, then left breast, left upper extremity from fingers and dorsal hand to lateral shoulder redirecting along pathways.  P/ROM to Lt shoulder into flex, abd and D2 gently to pts tolerance and with scapular depression throughout by therapist; multiple VC's throughout to relax due to guarding.  MFR gently to medial Lt upper arm and antecubital fossa over area of cording; one small pop felt by pt and therapist at end of session over antecubital fossa STM in Rt S/L to Lt lateral trunk with cocoa butter Scap Mobs in Rt S/L to Lt scapula into protraction and retraction Cut and issued TG soft size medium for pt to wear in lieu of her compression sleeve until her new one arrives.   09/16/23: Measured pt for a new compression sleeve since the one  given last session is too short and she is having swelling in her hand. She fit in to an off the shelf Sigvaris Secure size M2 (long) and medium glove - issued info for pt to obtain this online Pulleys x 2 min in direction of flexion and 2 min in direction of abduction MFR throughout LUE to help decrease cording with numerous cords palpable and a large thick cord palpable in L axilla Began Manual lymph drainage in supine as follows: short neck, right axillary nodes, left inguinal nodes, superficial and deep abdominals; anterior inter-axillary anastamoses, left axillo-inguinal anastamoses. Left upper extremity from fingers and dorsal hand to lateral shoulder redirecting along pathways. Instructed pt throughout in sequence, skin stretch and anatomy and  physiology of the lymphatic system.    09/09/23: Pulleys x 2 min in direction of flexion and 2 min in direction of abduction with pt returning therapist demo and feeling less discomfort at end of pulleys Measured pt for compression sleeve after increased Ldex score: Medi Harmony Size 3 EW and issued this to her    PATIENT EDUCATION:  Education details: Self MLD Person educated: Patient Education method: Medical illustrator, handout given Education comprehension: verbalized understanding and returned demonstration, will benefit from further review  HOME EXERCISE PROGRAM: Continue post op exercises Over the door pulleys Wear compression sleeve 12 hours a day for 4 weeks 09/24/23: Self MLD  ASSESSMENT:  CLINICAL IMPRESSION: Pt reports she has still been unable to tolerate her sleeve due to increased swelling. She was started on an antibiotic for an infection and her redness is much better today though she still has supraclavicular swelling on the L but pt did report increased comfort in this area by end of session. Encouraged her to try her sleeve again when she gets home.    OBJECTIVE IMPAIRMENTS: decreased knowledge of condition,  decreased knowledge of use of DME, decreased ROM, increased edema, increased fascial restrictions, impaired UE functional use, postural dysfunction, and pain.   ACTIVITY LIMITATIONS: carrying, lifting, and reach over head  PARTICIPATION LIMITATIONS: meal prep, cleaning, laundry, and community activity  PERSONAL FACTORS:  none  are also affecting patient's functional outcome.   REHAB POTENTIAL: Good  CLINICAL DECISION MAKING: Stable/uncomplicated  EVALUATION COMPLEXITY: Low  GOALS: Goals reviewed with patient? Yes  SHORT TERM GOALS=LONG TERM GOALS Target date: 10/07/23  Pt will be independent with self MLD for L UE and L breast for long term management of lymphedema. Baseline: Goal status: INITIAL  2.  Pt will return to the green on SOZO demonstrating reversal of subclinical lymphedema. Baseline:  Goal status: INITIAL  3.  Pt will demonstrate 165 degrees of L shoulder flexion to allow her to reach overhead.  Baseline:  Goal status: INITIAL  4.  Pt will be independent in a home exercise program for continued stretching and strengthening.  Baseline:  Goal status: INITIAL  5.  Pt will report a 75% improvement in pain and discomfort from axillary cording to allow improved comfort.  Baseline:  Goal status: INITIAL  6.  Pt will obtain appropriate compression garments - sleeve and bra for long term management.  Baseline:  Goal status: INITIAL  PLAN:  PT FREQUENCY: 2x/week  PT DURATION: 4 weeks  PLANNED INTERVENTIONS: Therapeutic exercises, Therapeutic activity, Patient/Family education, Self Care, Joint mobilization, Orthotic/Fit training, Manual lymph drainage, Compression bandaging, scar mobilization, Taping, Vasopneumatic device, Manual therapy, and Re-evaluation  PLAN FOR NEXT SESSION: update POC, how was sleeve?, cont and review MLD to L breast and LUE, MFR to cording in L axilla, did she get new sleeve? Assess fit  Leonette Most, PT 10/06/2023, 1:07  PM   Hug yourself.  Do circles at your neck just above your collarbones.  Repeat this 10 times.  Diaphragmatic - Supine   Inhale through nose making navel move out toward hands. Exhale through puckered lips, hands follow navel in. Repeat _5__ times. Rest _10__ seconds between repeats.    Axilla to Axilla - Sweep   On uninvolved side make 5 circles in the armpit, then pump _5__ times from involved armpit across chest to uninvolved armpit, making a pathway. Do _1__ time per day.  Copyright  VHI. All rights reserved.  Axilla to Inguinal Nodes - Sweep  On involved side, make 5 circles at groin at panty line, then pump _5__ times from armpit along side of trunk to outer hip, making your other pathway. Do __1_ time per day.  BREAST SEQUENCE: Draw an imaginary diagonal line from upper outer breast through the nipple area toward lower inner breast.  Direct fluid upward and inward from this line toward the pathway across your upper chest .  Do this in three rows to treat all of the upper inner breast tissue, and do each row 3-4x.      Direct fluid to treat all of lower outer breast tissue downward and outward toward pathway that is aimed at the left groin.   Arm Posterior: Elbow to Shoulder - Sweep   Pump _5__ times from back of elbow to top of shoulder. Then inner to outer upper arm _5_ times, then outer arm again _5_ times. Then back to the pathways _2-3_ times. Do _1__ time per day.  Copyright  VHI. All rights reserved.  ARM: Volar Wrist to Elbow - Sweep   Pump or stationary circles _5__ times from wrist to elbow making sure to do both sides of the forearm. Then retrace your steps to the outer arm, and the pathways _2-3_ times each. Do _1__ time per day.  Copyright  VHI. All rights reserved.  ARM: Dorsum of Hand to Shoulder - Sweep   Pump or stationary circles _5__ times on back of hand including knuckle spaces and individual fingers if needed working up towards the  wrist, then retrace all your steps working back up the forearm, doing both sides; upper outer arm and back to your pathways _2-3_ times each. Then do 5 circles again at uninvolved armpit and involved groin where you started! Good job!! Do __1_ time per day.  Cancer Rehab [phone]

## 2023-10-07 ENCOUNTER — Ambulatory Visit: Payer: Medicare Other

## 2023-10-08 ENCOUNTER — Ambulatory Visit
Admission: RE | Admit: 2023-10-08 | Discharge: 2023-10-08 | Disposition: A | Discharge status: Home / Self Care | Payer: Medicare Other | Source: Ambulatory Visit | Attending: Radiation Oncology | Admitting: Radiation Oncology

## 2023-10-08 ENCOUNTER — Ambulatory Visit: Payer: Medicare Other

## 2023-10-08 ENCOUNTER — Ambulatory Visit: Payer: Medicare Other | Admitting: Physical Therapy

## 2023-10-08 ENCOUNTER — Encounter: Payer: Self-pay | Admitting: Physical Therapy

## 2023-10-08 ENCOUNTER — Other Ambulatory Visit: Payer: Self-pay

## 2023-10-08 DIAGNOSIS — Z510 Encounter for antineoplastic radiation therapy: Secondary | ICD-10-CM | POA: Diagnosis not present

## 2023-10-08 DIAGNOSIS — Z171 Estrogen receptor negative status [ER-]: Secondary | ICD-10-CM | POA: Diagnosis not present

## 2023-10-08 DIAGNOSIS — R600 Localized edema: Secondary | ICD-10-CM | POA: Diagnosis not present

## 2023-10-08 DIAGNOSIS — R293 Abnormal posture: Secondary | ICD-10-CM

## 2023-10-08 DIAGNOSIS — C50412 Malignant neoplasm of upper-outer quadrant of left female breast: Secondary | ICD-10-CM

## 2023-10-08 DIAGNOSIS — M25612 Stiffness of left shoulder, not elsewhere classified: Secondary | ICD-10-CM

## 2023-10-08 DIAGNOSIS — Z483 Aftercare following surgery for neoplasm: Secondary | ICD-10-CM

## 2023-10-08 DIAGNOSIS — I890 Lymphedema, not elsewhere classified: Secondary | ICD-10-CM

## 2023-10-08 LAB — RAD ONC ARIA SESSION SUMMARY
Course Elapsed Days: 13
Plan Fractions Treated to Date: 9
Plan Prescribed Dose Per Fraction: 2.67 Gy
Plan Total Fractions Prescribed: 16
Plan Total Prescribed Dose: 42.72 Gy
Reference Point Dosage Given to Date: 24.03 Gy
Reference Point Session Dosage Given: 2.67 Gy
Session Number: 9

## 2023-10-08 NOTE — Therapy (Signed)
OUTPATIENT PHYSICAL THERAPY  UPPER EXTREMITY ONCOLOGY TREATMENT  Patient Name: [name] [name] MRN: [id number] DOB:November 06, 1951, 72 y.o., female Today's Date: 10/08/2023  END OF SESSION:  PT End of Session - 10/08/23 1254     Visit Number 8    Number of Visits 16    Date for PT Re-Evaluation 11/05/23    PT Start Time 1205    PT Stop Time 1254    PT Time Calculation (min) 49 min    Activity Tolerance Patient tolerated treatment well    Behavior During Therapy WFL for tasks assessed/performed                Past Medical History:  Diagnosis Date   Adenomatous colon polyp    Allergy    Anxiety    Arthritis    bilateral hands. lower back   Cancer [facility])    skin   Cataract    bilateral   COPD (chronic obstructive pulmonary disease) (HCC)    Depression    Diverticulosis    Endometriosis    GERD (gastroesophageal reflux disease)    Glaucoma    Headache    Migraines   Hiatal hernia    History of left breast cancer 05/2023   IBS (irritable bowel syndrome)    Inguinal hernia    Pneumothorax 1987   PONV (postoperative nausea and vomiting)    Past Surgical History:  Procedure Laterality Date   ABDOMINAL HYSTERECTOMY  1992   ANTERIOR CERVICAL DECOMP/DISCECTOMY FUSION N/A 09/23/2022   Procedure: Anterior Decompression Fusion,PLATE/SCREWS Cervical five-six; REMOVAL OLD PLATE;  Surgeon: Tressie Stalker, MD;  Location: [facility] OR;  Service: Neurosurgery;  Laterality: N/A;   APPENDECTOMY  1992   BACK SURGERY     cervical   BREAST BIOPSY Left 06/23/2023   [place] LT BREAST BX W LOC DEV 1ST LESION IMG BX SPEC US GUIDE 06/23/2023 [facility]   BREAST BIOPSY Left 07/28/2023   [place] LT RADIOACTIVE SEED LOC 07/28/2023 [facility]   BREAST LUMPECTOMY WITH RADIOACTIVE SEED AND SENTINEL LYMPH NODE BIOPSY Left 07/29/2023   Procedure: LEFT BREAST SEED LUMPECTOMY WITH LEFT SENTINEL LYMPH NODE MAPPING;  Surgeon: Harriette Bouillon, MD;  Location: [facility] OR;  Service: General;  Laterality: Left;   PEC BLOCK   CATARACT EXTRACTION Bilateral    2021, 2022   COLONOSCOPY  2023   PLEURAL SCARIFICATION     ruptured disk     torn tendon     right arm   TUBAL LIGATION     Patient Active Problem List   Diagnosis Date Noted   Genetic testing 07/11/2023   Malignant neoplasm of upper-outer quadrant of left breast in female, estrogen receptor negative (HCC) 06/30/2023   Cervical spondylosis with radiculopathy 09/23/2022   Elevated low-density lipoprotein level 06/14/2022   Coronary arteriosclerosis 06/14/2022   Adverse reaction to antihyperlipidemic drug, initial encounter 06/14/2022   Diarrhea 01/27/2019   Change in bowel habits 01/05/2019   Left inguinal hernia 06/27/2014   Bloating 05/17/2013   Constipation 05/17/2013   Pneumothorax 10/17/2008   HIATAL HERNIA 10/17/2008   DIVERTICULOSIS, COLON 10/17/2008   ENDOMETRIOSIS 10/17/2008   Right lower quadrant abdominal pain 10/17/2008   DEPRESSION, HX OF 10/17/2008   History of colonic polyps 10/17/2008   PNEUMOTHORAX 10/17/2008    PCP: Merri Brunette, MD   REFERRING PROVIDER: Rachel Moulds, MD  REFERRING DIAG: C50.412,Z17.1 (ICD-10-CM) - Malignant neoplasm of upper-outer quadrant of left breast in female, estrogen receptor negative (HCC) , L axillary cording  THERAPY DIAG:  Lymphedema, not elsewhere classified  Stiffness of left shoulder, not elsewhere classified  Aftercare following surgery for neoplasm  Abnormal posture  Malignant neoplasm of upper-outer quadrant of left breast in female, estrogen receptor negative (HCC)  ONSET DATE: 09/01/23  Rationale for Evaluation and Treatment: Rehabilitation  SUBJECTIVE:                                                                                                                                                                                           SUBJECTIVE STATEMENT: The pain in my neck is about the same. The sleeve feels tight. The lump on my inner elbow came up since I  started wearing the sleeve.   PERTINENT HISTORY: Patient was diagnosed on 08/15/2023 with left grade 3 invasive ductal carcinoma breast cancer. She underwent a left lumpectomy and sentinel node biopsy (13 negative nodes) on 07/29/2023. It is triple negative with a Ki67 of 95%. She has a history of a cervical fusion in 08/2022. Plans to start radiation soon.   PAIN:  PAIN:  Are you having pain? Yes NPRS scale: 3/10 Pain location: L supraclavicular area Pain orientation: Left  PAIN TYPE: burning Pain description: constant  Aggravating factors: touching it Relieving factors: none reported   PRECAUTIONS: Other: L UE lymphedema risk, cervical fusion C6, C7  RED FLAGS: Compression fracture: Yes: T12    WEIGHT BEARING RESTRICTIONS: No  FALLS:  Has patient fallen in last 6 months? No  LIVING ENVIRONMENT: Lives with: lives with their spouse Lives in: House/apartment Stairs: Yes; External: 5 steps; can reach both Has following equipment at home: None  OCCUPATION: locksmith  LEISURE: was walking until last week but recently stopped due to stomach issues  HAND DOMINANCE: right   PRIOR LEVEL OF FUNCTION: Independent  PATIENT GOALS: to get my arm back to normal   OBJECTIVE:  COGNITION: Overall cognitive status: Within functional limits for tasks assessed   PALPATION: Cording palpable in L axilla, increased edema noticeable in LUE and L breast  OBSERVATIONS / OTHER ASSESSMENTS: L breast more full than R especially at lateral breast  POSTURE: forward head and rounded shoulders  UPPER EXTREMITY AROM/PROM:  A/PROM RIGHT   eval   Shoulder extension 70  Shoulder flexion 167  Shoulder abduction 166  Shoulder internal rotation 65  Shoulder external rotation 78    (Blank rows = not tested)  A/PROM LEFT   eval  Shoulder extension 65  Shoulder flexion 155  Shoulder abduction 160  Shoulder internal rotation 55  Shoulder external rotation 83    (Blank rows = not  tested)    UPPER EXTREMITY STRENGTH:   LYMPHEDEMA ASSESSMENTS:  SURGERY TYPE/DATE: 07/29/23 L lumpectomy and SLNB  NUMBER OF LYMPH NODES REMOVED: 13  CHEMOTHERAPY: pt declined chemo  RADIATION: plans to begin radiation soon  HORMONE TREATMENT: none  INFECTIONS: none   LYMPHEDEMA ASSESSMENTS:   LANDMARK RIGHT  eval  At axilla  35.5  15 cm proximal to olecranon process 30.8  10 cm proximal to olecranon process 30  Olecranon process 26.5  15 cm proximal to ulnar styloid process 23.9  10 cm proximal to ulnar styloid process 20.4  Just proximal to ulnar styloid process 17.4  Across hand at thumb web space 19.5  At base of 2nd digit 6.5  (Blank rows = not tested)  LANDMARK LEFT  eval  At axilla  35.9  15 cm proximal to olecranon process 32.5  10 cm proximal to olecranon process 31.5  Olecranon process 28  15 cm proximal to ulnar styloid process 24.5  10 cm proximal to ulnar styloid process 20.6  Just proximal to ulnar styloid process 18.1  Across hand at thumb web space 20.6  At base of 2nd digit 6.4  (Blank rows = not tested)    QUICK DASH SURVEY:       The patient was assessed using the L-Dex machine today to produce a lymphedema index baseline score. The patient will be reassessed on a regular basis (typically every 3 months) to obtain new L-Dex scores. If the score is > 6.5 points away from his/her baseline score indicating onset of subclinical lymphedema, it will be recommended to wear a compression garment for 4 weeks, 12 hours per day and then be reassessed. If the score continues to be > 6.5 points from baseline at reassessment, we will initiate lymphedema treatment. Assessing in this manner has a 95% rate of preventing clinically significant lymphedema.   TODAY'S TREATMENT:                                                                                                                                          DATE: 10/08/23 Manual lymph drainage in  supine as follows: short neck, 5 diaphragmatic breaths; right axillary nodes, anterior inter-axillary anastomoses, left inguinal nodes, left axillo-inguinal anastomoses, then left breast, left upper extremity working from proximal to distal down to fingers and dorsal hand and back to lateral shoulder redirecting along pathways.  MFR gently to L drain site where there is a small cord palpable  10/06/23 Discussed on going fullness at L superclavicular region. Had Dwaine Gale, PT also look and assess since this is not a typical sign with UE lymphedema. She did have a scan in Sept of her neck and no evidence of lymphadenopathy was found at that time. Discussed with pt since she had 13 nodes removed it could be lymphedema since that area is an easy area for fluid to expand in to.  Continued Manual lymph drainage in supine as follows: short neck, right axillary  nodes, left inguinal nodes, superficial and deep abdominals; anterior inter-axillary anastamoses, left axillo-inguinal anastamoses. Left upper extremity from fingers and dorsal hand to lateral shoulder redirecting along pathways. Spent extra time at L supraclavicular region and pt reported increased comfort by end of session. Encouraged her to try her sleeve when she gets home.    10/01/23 See assessment   09/29/23: Manual therapy Continued to instruct pt in proper technqiue and answered her questions regarding breast MLD having her return demo each step and cuing offered throughout to adjust for lighter pressure and direction of skin stretch: Manual lymph drainage in supine as follows: short neck, 5 diaphragmatic breaths; right axillary nodes, anterior inter-axillary anastomoses, left inguinal nodes, left axillo-inguinal anastomoses, then left breast, left upper extremity working from proximal to distal down to fingers and dorsal hand and back to lateral shoulder redirecting along pathways.  MFR gently to L axilla where there is a thick cord and to  antecubital fossa over area of cording, this less palpable today  09/24/23: Manual therapy Instructed pt in following today having her return demo of each step and cuing offered throughout to adjust for lighter pressure: Manual lymph drainage in supine as follows: short neck, 5 diaphragmatic breaths; right axillary nodes, anterior inter-axillary anastomoses, left inguinal nodes, left axillo-inguinal anastomoses, then left breast, left upper extremity working from proximal to distal down to fingers and dorsal hand and back to lateral shoulder redirecting along pathways.  P/ROM to Lt shoulder into flex, abd and D2 gently to pts tolerance and with scapular depression throughout by therapist; multiple VC's throughout to relax due to guarding but less so today MFR gently to medial Lt upper arm and antecubital fossa over area of cording, this less palpable today  09/22/23: Manual therapy Manual lymph drainage in supine as follows: short neck, superficial and deep abdominals; right axillary nodes, right anterior intact thorax, anterior inter-axillary anastomoses, left inguinal nodes, left axillo-inguinal anastomoses, then left breast, left upper extremity from fingers and dorsal hand to lateral shoulder redirecting along pathways.  P/ROM to Lt shoulder into flex, abd and D2 gently to pts tolerance and with scapular depression throughout by therapist; multiple VC's throughout to relax due to guarding but less so today MFR gently to medial Lt upper arm and antecubital fossa over area of cording, this less palpable today   09/18/23: Manual therapy Manual lymph drainage in supine as follows: short neck, superficial and deep abdominals; right axillary nodes, right anterior intact thorax, anterior inter-axillary anastomoses, left inguinal nodes, left axillo-inguinal anastomoses, then left breast, left upper extremity from fingers and dorsal hand to lateral shoulder redirecting along pathways.  P/ROM to Lt shoulder  into flex, abd and D2 gently to pts tolerance and with scapular depression throughout by therapist; multiple VC's throughout to relax due to guarding.  MFR gently to medial Lt upper arm and antecubital fossa over area of cording; one small pop felt by pt and therapist at end of session over antecubital fossa STM in Rt S/L to Lt lateral trunk with cocoa butter Scap Mobs in Rt S/L to Lt scapula into protraction and retraction Cut and issued TG soft size medium for pt to wear in lieu of her compression sleeve until her new one arrives.   09/16/23: Measured pt for a new compression sleeve since the one given last session is too short and she is having swelling in her hand. She fit in to an off the shelf Sigvaris Secure size M2 (long) and medium glove - issued  info for pt to obtain this online Pulleys x 2 min in direction of flexion and 2 min in direction of abduction MFR throughout LUE to help decrease cording with numerous cords palpable and a large thick cord palpable in L axilla Began Manual lymph drainage in supine as follows: short neck, right axillary nodes, left inguinal nodes, superficial and deep abdominals; anterior inter-axillary anastamoses, left axillo-inguinal anastamoses. Left upper extremity from fingers and dorsal hand to lateral shoulder redirecting along pathways. Instructed pt throughout in sequence, skin stretch and anatomy and physiology of the lymphatic system.    09/09/23: Pulleys x 2 min in direction of flexion and 2 min in direction of abduction with pt returning therapist demo and feeling less discomfort at end of pulleys Measured pt for compression sleeve after increased Ldex score: Medi Harmony Size 3 EW and issued this to her    PATIENT EDUCATION:  Education details: Self MLD Person educated: Patient Education method: Medical illustrator, handout given Education comprehension: verbalized understanding and returned demonstration, will benefit from further  review  HOME EXERCISE PROGRAM: Continue post op exercises Over the door pulleys Wear compression sleeve 12 hours a day for 4 weeks 09/24/23: Self MLD  ASSESSMENT:  CLINICAL IMPRESSION: Pt has had worsening of lymphedema since beginning radiation. She has supraclavicular swelling and worsening lymphedema of L breast and UE. She also has cording still present in L axilla and at just inferior to drain incision. She would benefit from additional skilled PT visits to decrease L breast and UE lymphedema and to decrease tightness and pain in L axilla secondary to cording.    OBJECTIVE IMPAIRMENTS: decreased knowledge of condition, decreased knowledge of use of DME, decreased ROM, increased edema, increased fascial restrictions, impaired UE functional use, postural dysfunction, and pain.   ACTIVITY LIMITATIONS: carrying, lifting, and reach over head  PARTICIPATION LIMITATIONS: meal prep, cleaning, laundry, and community activity  PERSONAL FACTORS:  none  are also affecting patient's functional outcome.   REHAB POTENTIAL: Good  CLINICAL DECISION MAKING: Stable/uncomplicated  EVALUATION COMPLEXITY: Low  GOALS: Goals reviewed with patient? Yes  SHORT TERM GOALS=LONG TERM GOALS Target date: 10/07/23  Pt will be independent with self MLD for L UE and L breast for long term management of lymphedema. Baseline: Goal status: ONGOING 10/08/23  2.  Pt will return to the green on SOZO demonstrating reversal of subclinical lymphedema. Baseline:  Goal status: ONGOING 10/08/23  3.  Pt will demonstrate 165 degrees of L shoulder flexion to allow her to reach overhead.  Baseline:  Goal status: ONGOING 10/08/23  4.  Pt will be independent in a home exercise program for continued stretching and strengthening.  Baseline:  Goal status: ONGOING 10/08/23  5.  Pt will report a 75% improvement in pain and discomfort from axillary cording to allow improved comfort.  Baseline:  Goal status: ONGOING 10/08/23-  still presents with cording and discomfort in axilla  6.  Pt will obtain appropriate compression garments - sleeve and bra for long term management.  Baseline:  Goal status: MET 10/08/23 has sleeve and compression bra  PLAN:  PT FREQUENCY: 2x/week  PT DURATION: 4 weeks  PLANNED INTERVENTIONS: Therapeutic exercises, Therapeutic activity, Patient/Family education, Self Care, Joint mobilization, Orthotic/Fit training, Manual lymph drainage, Compression bandaging, scar mobilization, Taping, Vasopneumatic device, Manual therapy, and Re-evaluation  PLAN FOR NEXT SESSION: PROM to L shoulder and MFR to L axilla for cording, repeat SOZO, cont and review MLD to L breast and LUE, MFR to cording in L  axilla  Milagros Loll Blue, PT 10/08/2023, 1:07 PM   Hug yourself.  Do circles at your neck just above your collarbones.  Repeat this 10 times.  Diaphragmatic - Supine   Inhale through nose making navel move out toward hands. Exhale through puckered lips, hands follow navel in. Repeat _5__ times. Rest _10__ seconds between repeats.    Axilla to Axilla - Sweep   On uninvolved side make 5 circles in the armpit, then pump _5__ times from involved armpit across chest to uninvolved armpit, making a pathway. Do _1__ time per day.  Copyright  VHI. All rights reserved.  Axilla to Inguinal Nodes - Sweep   On involved side, make 5 circles at groin at panty line, then pump _5__ times from armpit along side of trunk to outer hip, making your other pathway. Do __1_ time per day.  BREAST SEQUENCE: Draw an imaginary diagonal line from upper outer breast through the nipple area toward lower inner breast.  Direct fluid upward and inward from this line toward the pathway across your upper chest .  Do this in three rows to treat all of the upper inner breast tissue, and do each row 3-4x.      Direct fluid to treat all of lower outer breast tissue downward and outward toward pathway that is aimed at the  left groin.   Arm Posterior: Elbow to Shoulder - Sweep   Pump _5__ times from back of elbow to top of shoulder. Then inner to outer upper arm _5_ times, then outer arm again _5_ times. Then back to the pathways _2-3_ times. Do _1__ time per day.  Copyright  VHI. All rights reserved.  ARM: Volar Wrist to Elbow - Sweep   Pump or stationary circles _5__ times from wrist to elbow making sure to do both sides of the forearm. Then retrace your steps to the outer arm, and the pathways _2-3_ times each. Do _1__ time per day.  Copyright  VHI. All rights reserved.  ARM: Dorsum of Hand to Shoulder - Sweep   Pump or stationary circles _5__ times on back of hand including knuckle spaces and individual fingers if needed working up towards the wrist, then retrace all your steps working back up the forearm, doing both sides; upper outer arm and back to your pathways _2-3_ times each. Then do 5 circles again at uninvolved armpit and involved groin where you started! Good job!! Do __1_ time per day.  Cancer Rehab [phone]

## 2023-10-09 ENCOUNTER — Ambulatory Visit
Admission: RE | Admit: 2023-10-09 | Discharge: 2023-10-09 | Disposition: A | Discharge status: Home / Self Care | Payer: Medicare Other | Source: Ambulatory Visit | Attending: Radiation Oncology | Admitting: Radiation Oncology

## 2023-10-09 ENCOUNTER — Other Ambulatory Visit: Payer: Self-pay

## 2023-10-09 ENCOUNTER — Ambulatory Visit: Payer: Medicare Other

## 2023-10-09 DIAGNOSIS — Z171 Estrogen receptor negative status [ER-]: Secondary | ICD-10-CM | POA: Diagnosis not present

## 2023-10-09 DIAGNOSIS — Z510 Encounter for antineoplastic radiation therapy: Secondary | ICD-10-CM | POA: Diagnosis not present

## 2023-10-09 DIAGNOSIS — C50412 Malignant neoplasm of upper-outer quadrant of left female breast: Secondary | ICD-10-CM | POA: Diagnosis not present

## 2023-10-09 LAB — RAD ONC ARIA SESSION SUMMARY
Course Elapsed Days: 14
Plan Fractions Treated to Date: 10
Plan Prescribed Dose Per Fraction: 2.67 Gy
Plan Total Fractions Prescribed: 16
Plan Total Prescribed Dose: 42.72 Gy
Reference Point Dosage Given to Date: 26.7 Gy
Reference Point Session Dosage Given: 2.67 Gy
Session Number: 10

## 2023-10-10 ENCOUNTER — Other Ambulatory Visit: Payer: Self-pay

## 2023-10-10 ENCOUNTER — Ambulatory Visit
Admission: RE | Admit: 2023-10-10 | Discharge: 2023-10-10 | Disposition: A | Discharge status: Home / Self Care | Payer: Medicare Other | Source: Ambulatory Visit | Attending: Radiation Oncology | Admitting: Radiation Oncology

## 2023-10-10 DIAGNOSIS — Z510 Encounter for antineoplastic radiation therapy: Secondary | ICD-10-CM | POA: Diagnosis not present

## 2023-10-10 DIAGNOSIS — Z171 Estrogen receptor negative status [ER-]: Secondary | ICD-10-CM | POA: Diagnosis not present

## 2023-10-10 DIAGNOSIS — C50412 Malignant neoplasm of upper-outer quadrant of left female breast: Secondary | ICD-10-CM | POA: Diagnosis not present

## 2023-10-10 LAB — RAD ONC ARIA SESSION SUMMARY
Course Elapsed Days: 15
Plan Fractions Treated to Date: 11
Plan Prescribed Dose Per Fraction: 2.67 Gy
Plan Total Fractions Prescribed: 16
Plan Total Prescribed Dose: 42.72 Gy
Reference Point Dosage Given to Date: 29.37 Gy
Reference Point Session Dosage Given: 2.67 Gy
Session Number: 11

## 2023-10-13 ENCOUNTER — Other Ambulatory Visit: Payer: Self-pay

## 2023-10-13 ENCOUNTER — Ambulatory Visit
Admission: RE | Admit: 2023-10-13 | Discharge: 2023-10-13 | Disposition: A | Discharge status: Home / Self Care | Payer: Medicare Other | Source: Ambulatory Visit | Attending: Radiation Oncology | Admitting: Radiation Oncology

## 2023-10-13 DIAGNOSIS — C50412 Malignant neoplasm of upper-outer quadrant of left female breast: Secondary | ICD-10-CM | POA: Diagnosis not present

## 2023-10-13 DIAGNOSIS — Z510 Encounter for antineoplastic radiation therapy: Secondary | ICD-10-CM | POA: Diagnosis not present

## 2023-10-13 DIAGNOSIS — Z171 Estrogen receptor negative status [ER-]: Secondary | ICD-10-CM | POA: Diagnosis not present

## 2023-10-13 LAB — RAD ONC ARIA SESSION SUMMARY
Course Elapsed Days: 18
Plan Fractions Treated to Date: 12
Plan Prescribed Dose Per Fraction: 2.67 Gy
Plan Total Fractions Prescribed: 16
Plan Total Prescribed Dose: 42.72 Gy
Reference Point Dosage Given to Date: 32.04 Gy
Reference Point Session Dosage Given: 2.67 Gy
Session Number: 12

## 2023-10-14 ENCOUNTER — Ambulatory Visit
Admission: RE | Admit: 2023-10-14 | Discharge: 2023-10-14 | Disposition: A | Discharge status: Home / Self Care | Payer: Medicare Other | Source: Ambulatory Visit | Attending: Radiation Oncology | Admitting: Radiation Oncology

## 2023-10-14 ENCOUNTER — Other Ambulatory Visit: Payer: Self-pay

## 2023-10-14 DIAGNOSIS — Z171 Estrogen receptor negative status [ER-]: Secondary | ICD-10-CM

## 2023-10-14 DIAGNOSIS — Z510 Encounter for antineoplastic radiation therapy: Secondary | ICD-10-CM | POA: Diagnosis not present

## 2023-10-14 DIAGNOSIS — C50412 Malignant neoplasm of upper-outer quadrant of left female breast: Secondary | ICD-10-CM | POA: Diagnosis not present

## 2023-10-14 LAB — RAD ONC ARIA SESSION SUMMARY
Course Elapsed Days: 19
Plan Fractions Treated to Date: 13
Plan Prescribed Dose Per Fraction: 2.67 Gy
Plan Total Fractions Prescribed: 16
Plan Total Prescribed Dose: 42.72 Gy
Reference Point Dosage Given to Date: 34.71 Gy
Reference Point Session Dosage Given: 2.67 Gy
Session Number: 13

## 2023-10-14 MED ORDER — RADIAPLEXRX EX GEL: Freq: Once | CUTANEOUS | Status: AC

## 2023-10-15 ENCOUNTER — Ambulatory Visit
Admission: RE | Admit: 2023-10-15 | Discharge: 2023-10-15 | Disposition: A | Discharge status: Home / Self Care | Payer: Medicare Other | Source: Ambulatory Visit | Attending: Radiation Oncology | Admitting: Radiation Oncology

## 2023-10-15 ENCOUNTER — Other Ambulatory Visit: Payer: Self-pay

## 2023-10-15 DIAGNOSIS — C50412 Malignant neoplasm of upper-outer quadrant of left female breast: Secondary | ICD-10-CM | POA: Diagnosis not present

## 2023-10-15 DIAGNOSIS — Z510 Encounter for antineoplastic radiation therapy: Secondary | ICD-10-CM | POA: Diagnosis not present

## 2023-10-15 DIAGNOSIS — Z171 Estrogen receptor negative status [ER-]: Secondary | ICD-10-CM | POA: Diagnosis not present

## 2023-10-15 LAB — RAD ONC ARIA SESSION SUMMARY
Course Elapsed Days: 20
Plan Fractions Treated to Date: 14
Plan Prescribed Dose Per Fraction: 2.67 Gy
Plan Total Fractions Prescribed: 16
Plan Total Prescribed Dose: 42.72 Gy
Reference Point Dosage Given to Date: 37.38 Gy
Reference Point Session Dosage Given: 2.67 Gy
Session Number: 14

## 2023-10-16 ENCOUNTER — Ambulatory Visit: Payer: Medicare Other

## 2023-10-16 ENCOUNTER — Ambulatory Visit
Admission: RE | Admit: 2023-10-16 | Discharge: 2023-10-16 | Disposition: A | Discharge status: Home / Self Care | Payer: Medicare Other | Source: Ambulatory Visit | Attending: Radiation Oncology | Admitting: Radiation Oncology

## 2023-10-16 ENCOUNTER — Other Ambulatory Visit: Payer: Self-pay

## 2023-10-16 DIAGNOSIS — Z171 Estrogen receptor negative status [ER-]: Secondary | ICD-10-CM | POA: Diagnosis not present

## 2023-10-16 DIAGNOSIS — C50412 Malignant neoplasm of upper-outer quadrant of left female breast: Secondary | ICD-10-CM | POA: Diagnosis not present

## 2023-10-16 DIAGNOSIS — Z510 Encounter for antineoplastic radiation therapy: Secondary | ICD-10-CM | POA: Diagnosis not present

## 2023-10-16 LAB — RAD ONC ARIA SESSION SUMMARY
Course Elapsed Days: 21
Plan Fractions Treated to Date: 15
Plan Prescribed Dose Per Fraction: 2.67 Gy
Plan Total Fractions Prescribed: 16
Plan Total Prescribed Dose: 42.72 Gy
Reference Point Dosage Given to Date: 40.05 Gy
Reference Point Session Dosage Given: 2.67 Gy
Session Number: 15

## 2023-10-17 ENCOUNTER — Ambulatory Visit
Admission: RE | Admit: 2023-10-17 | Discharge: 2023-10-17 | Disposition: A | Discharge status: Home / Self Care | Payer: Medicare Other | Source: Ambulatory Visit | Attending: Radiation Oncology | Admitting: Radiation Oncology

## 2023-10-17 ENCOUNTER — Other Ambulatory Visit: Payer: Self-pay

## 2023-10-17 DIAGNOSIS — Z171 Estrogen receptor negative status [ER-]: Secondary | ICD-10-CM | POA: Diagnosis not present

## 2023-10-17 DIAGNOSIS — C50412 Malignant neoplasm of upper-outer quadrant of left female breast: Secondary | ICD-10-CM | POA: Diagnosis not present

## 2023-10-17 DIAGNOSIS — Z510 Encounter for antineoplastic radiation therapy: Secondary | ICD-10-CM | POA: Diagnosis not present

## 2023-10-17 LAB — RAD ONC ARIA SESSION SUMMARY
Course Elapsed Days: 22
Plan Fractions Treated to Date: 16
Plan Prescribed Dose Per Fraction: 2.67 Gy
Plan Total Fractions Prescribed: 16
Plan Total Prescribed Dose: 42.72 Gy
Reference Point Dosage Given to Date: 42.72 Gy
Reference Point Session Dosage Given: 2.67 Gy
Session Number: 16

## 2023-10-20 NOTE — Radiation Completion Notes (Signed)
Patient Name: [name] [name], [name] [name] MRN: [id number] Date of Birth: [birth year] Referring Physician: Rachel Moulds, M.D. Date of Service: 2023-10-20 Radiation Oncologist: Arnette Schaumann, M.D. [facility] [place]                             RADIATION ONCOLOGY END OF TREATMENT NOTE     Diagnosis: C50.412 Malignant neoplasm of upper-outer quadrant of left female breast Staging on 2023-07-02: Malignant neoplasm of upper-outer quadrant of left breast in female, estrogen receptor negative (HCC) T=cT2, N=cN0, M=cM0 Intent: Curative     ==========DELIVERED PLANS==========  First Treatment Date: 2023-09-25 - Last Treatment Date: 2023-10-17   Plan Name: Breast_L Site: Breast, Left Technique: 3D Mode: Photon Dose Per Fraction: 2.67 Gy Prescribed Dose (Delivered / Prescribed): 42.72 Gy / 42.72 Gy Prescribed Fxs (Delivered / Prescribed): 16 / 16     ==========ON TREATMENT VISIT DATES========== 2023-09-30, 2023-10-08, 2023-10-14     ==========UPCOMING VISITS==========       ==========APPENDIX - ON TREATMENT VISIT NOTES==========   See weekly On Treatment Notes in Epic for details.

## 2023-10-21 ENCOUNTER — Encounter: Payer: Self-pay | Admitting: Rehabilitation

## 2023-10-21 ENCOUNTER — Ambulatory Visit: Payer: Medicare Other | Admitting: Rehabilitation

## 2023-10-21 DIAGNOSIS — Z171 Estrogen receptor negative status [ER-]: Secondary | ICD-10-CM

## 2023-10-21 DIAGNOSIS — C50412 Malignant neoplasm of upper-outer quadrant of left female breast: Secondary | ICD-10-CM | POA: Diagnosis not present

## 2023-10-21 DIAGNOSIS — R600 Localized edema: Secondary | ICD-10-CM | POA: Diagnosis not present

## 2023-10-21 DIAGNOSIS — Z483 Aftercare following surgery for neoplasm: Secondary | ICD-10-CM | POA: Diagnosis not present

## 2023-10-21 DIAGNOSIS — I890 Lymphedema, not elsewhere classified: Secondary | ICD-10-CM | POA: Diagnosis not present

## 2023-10-21 DIAGNOSIS — M25612 Stiffness of left shoulder, not elsewhere classified: Secondary | ICD-10-CM | POA: Diagnosis not present

## 2023-10-21 DIAGNOSIS — R293 Abnormal posture: Secondary | ICD-10-CM

## 2023-10-21 NOTE — Therapy (Signed)
OUTPATIENT PHYSICAL THERAPY  UPPER EXTREMITY ONCOLOGY TREATMENT  Patient Name: [name] [name] MRN: [id number] DOB:09-Feb-1951, 72 y.o., female Today's Date: 10/21/2023  END OF SESSION:  PT End of Session - 10/21/23 1247     Visit Number 9    Number of Visits 16    Date for PT Re-Evaluation 11/05/23    PT Start Time 1200    PT Stop Time 1245    PT Time Calculation (min) 45 min    Activity Tolerance Patient tolerated treatment well    Behavior During Therapy WFL for tasks assessed/performed                 Past Medical History:  Diagnosis Date   Adenomatous colon polyp    Allergy    Anxiety    Arthritis    bilateral hands. lower back   Cancer [facility])    skin   Cataract    bilateral   COPD (chronic obstructive pulmonary disease) (HCC)    Depression    Diverticulosis    Endometriosis    GERD (gastroesophageal reflux disease)    Glaucoma    Headache    Migraines   Hiatal hernia    History of left breast cancer 05/2023   IBS (irritable bowel syndrome)    Inguinal hernia    Pneumothorax 1987   PONV (postoperative nausea and vomiting)    Past Surgical History:  Procedure Laterality Date   ABDOMINAL HYSTERECTOMY  1992   ANTERIOR CERVICAL DECOMP/DISCECTOMY FUSION N/A 09/23/2022   Procedure: Anterior Decompression Fusion,PLATE/SCREWS Cervical five-six; REMOVAL OLD PLATE;  Surgeon: Tressie Stalker, MD;  Location: [facility] OR;  Service: Neurosurgery;  Laterality: N/A;   APPENDECTOMY  1992   BACK SURGERY     cervical   BREAST BIOPSY Left 06/23/2023   [place] LT BREAST BX W LOC DEV 1ST LESION IMG BX SPEC US GUIDE 06/23/2023 [facility]   BREAST BIOPSY Left 07/28/2023   [place] LT RADIOACTIVE SEED LOC 07/28/2023 [facility]   BREAST LUMPECTOMY WITH RADIOACTIVE SEED AND SENTINEL LYMPH NODE BIOPSY Left 07/29/2023   Procedure: LEFT BREAST SEED LUMPECTOMY WITH LEFT SENTINEL LYMPH NODE MAPPING;  Surgeon: Harriette Bouillon, MD;  Location: [facility] OR;  Service: General;  Laterality:  Left;  PEC BLOCK   CATARACT EXTRACTION Bilateral    2021, 2022   COLONOSCOPY  2023   PLEURAL SCARIFICATION     ruptured disk     torn tendon     right arm   TUBAL LIGATION     Patient Active Problem List   Diagnosis Date Noted   Genetic testing 07/11/2023   Malignant neoplasm of upper-outer quadrant of left breast in female, estrogen receptor negative (HCC) 06/30/2023   Cervical spondylosis with radiculopathy 09/23/2022   Elevated low-density lipoprotein level 06/14/2022   Coronary arteriosclerosis 06/14/2022   Adverse reaction to antihyperlipidemic drug, initial encounter 06/14/2022   Diarrhea 01/27/2019   Change in bowel habits 01/05/2019   Left inguinal hernia 06/27/2014   Bloating 05/17/2013   Constipation 05/17/2013   Pneumothorax 10/17/2008   HIATAL HERNIA 10/17/2008   DIVERTICULOSIS, COLON 10/17/2008   ENDOMETRIOSIS 10/17/2008   Right lower quadrant abdominal pain 10/17/2008   DEPRESSION, HX OF 10/17/2008   History of colonic polyps 10/17/2008   PNEUMOTHORAX 10/17/2008    PCP: Merri Brunette, MD   REFERRING PROVIDER: Rachel Moulds, MD  REFERRING DIAG: C50.412,Z17.1 (ICD-10-CM) - Malignant neoplasm of upper-outer quadrant of left breast in female, estrogen receptor negative (HCC) , L axillary cording  THERAPY DIAG:  Lymphedema, not elsewhere classified  Stiffness of left shoulder, not elsewhere classified  Aftercare following surgery for neoplasm  Malignant neoplasm of upper-outer quadrant of left breast in female, estrogen receptor negative (HCC)  Localized edema  Abnormal posture  ONSET DATE: 09/01/23  Rationale for Evaluation and Treatment: Rehabilitation  SUBJECTIVE:                                                                                                                                                                                           SUBJECTIVE STATEMENT: My armpit and my boob and the top of the arm are killing me.  And I'm allergic to  the radiation cream they gave me so I have to use cortisone cream.  By the end of the day the hand is swollen.   PERTINENT HISTORY: Patient was diagnosed on 08/15/2023 with left grade 3 invasive ductal carcinoma breast cancer. She underwent a left lumpectomy and sentinel node biopsy (13 negative nodes) on 07/29/2023. It is triple negative with a Ki67 of 95%. She has a history of a cervical fusion in 08/2022. Plans to start radiation soon.   PAIN:  PAIN:  Are you having pain? Yes NPRS scale: 3/10 Pain location: L supraclavicular area Pain orientation: Left  PAIN TYPE: burning Pain description: constant  Aggravating factors: touching it Relieving factors: none reported   PRECAUTIONS: Other: L UE lymphedema risk, cervical fusion C6, C7  RED FLAGS: Compression fracture: Yes: T12    WEIGHT BEARING RESTRICTIONS: No  FALLS:  Has patient fallen in last 6 months? No  LIVING ENVIRONMENT: Lives with: lives with their spouse Lives in: House/apartment Stairs: Yes; External: 5 steps; can reach both Has following equipment at home: None  OCCUPATION: locksmith  LEISURE: was walking until last week but recently stopped due to stomach issues  HAND DOMINANCE: right   PRIOR LEVEL OF FUNCTION: Independent  PATIENT GOALS: to get my arm back to normal   OBJECTIVE:  COGNITION: Overall cognitive status: Within functional limits for tasks assessed   PALPATION: Cording palpable in L axilla, increased edema noticeable in LUE and L breast  OBSERVATIONS / OTHER ASSESSMENTS: L breast more full than R especially at lateral breast  POSTURE: forward head and rounded shoulders  UPPER EXTREMITY AROM/PROM:  A/PROM RIGHT   eval   Shoulder extension 70  Shoulder flexion 167  Shoulder abduction 166  Shoulder internal rotation 65  Shoulder external rotation 78    (Blank rows = not tested)  A/PROM LEFT   eval  Shoulder extension 65  Shoulder flexion 155  Shoulder abduction 160  Shoulder  internal rotation 55  Shoulder external rotation  83    (Blank rows = not tested)  LYMPHEDEMA ASSESSMENTS:  SURGERY TYPE/DATE: 07/29/23 L lumpectomy and SLNB NUMBER OF LYMPH NODES REMOVED: 13 CHEMOTHERAPY: pt declined chemo RADIATION: plans to begin radiation soon HORMONE TREATMENT: none INFECTIONS: none  LYMPHEDEMA ASSESSMENTS:   LANDMARK RIGHT  eval  At axilla  35.5  15 cm proximal to olecranon process 30.8  10 cm proximal to olecranon process 30  Olecranon process 26.5  15 cm proximal to ulnar styloid process 23.9  10 cm proximal to ulnar styloid process 20.4  Just proximal to ulnar styloid process 17.4  Across hand at thumb web space 19.5  At base of 2nd digit 6.5  (Blank rows = not tested)  LANDMARK LEFT  eval  At axilla  35.9  15 cm proximal to olecranon process 32.5  10 cm proximal to olecranon process 31.5  Olecranon process 28  15 cm proximal to ulnar styloid process 24.5  10 cm proximal to ulnar styloid process 20.6  Just proximal to ulnar styloid process 18.1  Across hand at thumb web space 20.6  At base of 2nd digit 6.4  (Blank rows = not tested)  TODAY'S TREATMENT:                                                                                                                                          DATE: 10/21/23 Redid SOZO which is back in the green at 2.0 - emphasized to pt that she is in the highest risk right now and she should continue wearing the sleeve.   Breast is red with a patch of more fragile appearing skin in the axilla so focused on MLD of the UE today.   Manual lymph drainage in supine as follows: short neck, 5 diaphragmatic breaths; right axillary nodes, anterior inter-axillary anastomoses, left inguinal nodes, left axillo-inguinal anastomosis, left upper extremity working from proximal to distal down to fingers and dorsal hand and back to lateral shoulder redirecting along pathways.  PROM into flexion x 3   10/08/23 Manual lymph drainage  in supine as follows: short neck, 5 diaphragmatic breaths; right axillary nodes, anterior inter-axillary anastomoses, left inguinal nodes, left axillo-inguinal anastomoses, then left breast, left upper extremity working from proximal to distal down to fingers and dorsal hand and back to lateral shoulder redirecting along pathways.  MFR gently to L drain site where there is a small cord palpable  10/06/23 Discussed on going fullness at L superclavicular region. Had Dwaine Gale, PT also look and assess since this is not a typical sign with UE lymphedema. She did have a scan in Sept of her neck and no evidence of lymphadenopathy was found at that time. Discussed with pt since she had 13 nodes removed it could be lymphedema since that area is an easy area for fluid to expand in to.  Continued Manual lymph drainage in supine as follows: short  neck, right axillary nodes, left inguinal nodes, superficial and deep abdominals; anterior inter-axillary anastamoses, left axillo-inguinal anastamoses. Left upper extremity from fingers and dorsal hand to lateral shoulder redirecting along pathways. Spent extra time at L supraclavicular region and pt reported increased comfort by end of session. Encouraged her to try her sleeve when she gets home.    10/01/23 See assessment   09/29/23: Manual therapy Continued to instruct pt in proper technqiue and answered her questions regarding breast MLD having her return demo each step and cuing offered throughout to adjust for lighter pressure and direction of skin stretch: Manual lymph drainage in supine as follows: short neck, 5 diaphragmatic breaths; right axillary nodes, anterior inter-axillary anastomoses, left inguinal nodes, left axillo-inguinal anastomoses, then left breast, left upper extremity working from proximal to distal down to fingers and dorsal hand and back to lateral shoulder redirecting along pathways.  MFR gently to L axilla where there is a thick cord and to  antecubital fossa over area of cording, this less palpable today  09/24/23: Manual therapy Instructed pt in following today having her return demo of each step and cuing offered throughout to adjust for lighter pressure: Manual lymph drainage in supine as follows: short neck, 5 diaphragmatic breaths; right axillary nodes, anterior inter-axillary anastomoses, left inguinal nodes, left axillo-inguinal anastomoses, then left breast, left upper extremity working from proximal to distal down to fingers and dorsal hand and back to lateral shoulder redirecting along pathways.  P/ROM to Lt shoulder into flex, abd and D2 gently to pts tolerance and with scapular depression throughout by therapist; multiple VC's throughout to relax due to guarding but less so today MFR gently to medial Lt upper arm and antecubital fossa over area of cording, this less palpable today  09/22/23: Manual therapy Manual lymph drainage in supine as follows: short neck, superficial and deep abdominals; right axillary nodes, right anterior intact thorax, anterior inter-axillary anastomoses, left inguinal nodes, left axillo-inguinal anastomoses, then left breast, left upper extremity from fingers and dorsal hand to lateral shoulder redirecting along pathways.  P/ROM to Lt shoulder into flex, abd and D2 gently to pts tolerance and with scapular depression throughout by therapist; multiple VC's throughout to relax due to guarding but less so today MFR gently to medial Lt upper arm and antecubital fossa over area of cording, this less palpable today   09/18/23: Manual therapy Manual lymph drainage in supine as follows: short neck, superficial and deep abdominals; right axillary nodes, right anterior intact thorax, anterior inter-axillary anastomoses, left inguinal nodes, left axillo-inguinal anastomoses, then left breast, left upper extremity from fingers and dorsal hand to lateral shoulder redirecting along pathways.  P/ROM to Lt shoulder  into flex, abd and D2 gently to pts tolerance and with scapular depression throughout by therapist; multiple VC's throughout to relax due to guarding.  MFR gently to medial Lt upper arm and antecubital fossa over area of cording; one small pop felt by pt and therapist at end of session over antecubital fossa STM in Rt S/L to Lt lateral trunk with cocoa butter Scap Mobs in Rt S/L to Lt scapula into protraction and retraction Cut and issued TG soft size medium for pt to wear in lieu of her compression sleeve until her new one arrives.   09/16/23: Measured pt for a new compression sleeve since the one given last session is too short and she is having swelling in her hand. She fit in to an off the shelf Sigvaris Secure size M2 (long) and medium  glove - issued info for pt to obtain this online Pulleys x 2 min in direction of flexion and 2 min in direction of abduction MFR throughout LUE to help decrease cording with numerous cords palpable and a large thick cord palpable in L axilla Began Manual lymph drainage in supine as follows: short neck, right axillary nodes, left inguinal nodes, superficial and deep abdominals; anterior inter-axillary anastamoses, left axillo-inguinal anastamoses. Left upper extremity from fingers and dorsal hand to lateral shoulder redirecting along pathways. Instructed pt throughout in sequence, skin stretch and anatomy and physiology of the lymphatic system.    09/09/23: Pulleys x 2 min in direction of flexion and 2 min in direction of abduction with pt returning therapist demo and feeling less discomfort at end of pulleys Measured pt for compression sleeve after increased Ldex score: Medi Harmony Size 3 EW and issued this to her    PATIENT EDUCATION:  Education details: Self MLD Person educated: Patient Education method: Medical illustrator, handout given Education comprehension: verbalized understanding and returned demonstration, will benefit from further  review  HOME EXERCISE PROGRAM: Continue post op exercises Over the door pulleys Wear compression sleeve 12 hours a day for 4 weeks 09/24/23: Self MLD  ASSESSMENT:  CLINICAL IMPRESSION: Pt is very frustrated thinking that she is getting bad lymphedema in the breast and upper arm, but pt was reassured that she appears normal from finishing up radiation and that the red and increased pain and swelling is normal as well.  She was encouraged by the arm SOZO returning to green.  Discussed the trajectory of radiation healing etc.    OBJECTIVE IMPAIRMENTS: decreased knowledge of condition, decreased knowledge of use of DME, decreased ROM, increased edema, increased fascial restrictions, impaired UE functional use, postural dysfunction, and pain.   ACTIVITY LIMITATIONS: carrying, lifting, and reach over head  PARTICIPATION LIMITATIONS: meal prep, cleaning, laundry, and community activity  PERSONAL FACTORS:  none  are also affecting patient's functional outcome.   REHAB POTENTIAL: Good  CLINICAL DECISION MAKING: Stable/uncomplicated  EVALUATION COMPLEXITY: Low  GOALS: Goals reviewed with patient? Yes  SHORT TERM GOALS=LONG TERM GOALS Target date: 10/07/23  Pt will be independent with self MLD for L UE and L breast for long term management of lymphedema. Baseline: Goal status: ONGOING 10/08/23  2.  Pt will return to the green on SOZO demonstrating reversal of subclinical lymphedema. Baseline:  Goal status: ONGOING 10/08/23  3.  Pt will demonstrate 165 degrees of L shoulder flexion to allow her to reach overhead.  Baseline:  Goal status: ONGOING 10/08/23  4.  Pt will be independent in a home exercise program for continued stretching and strengthening.  Baseline:  Goal status: ONGOING 10/08/23  5.  Pt will report a 75% improvement in pain and discomfort from axillary cording to allow improved comfort.  Baseline:  Goal status: ONGOING 10/08/23- still presents with cording and discomfort  in axilla  6.  Pt will obtain appropriate compression garments - sleeve and bra for long term management.  Baseline:  Goal status: MET 10/08/23 has sleeve and compression bra  PLAN:  PT FREQUENCY: 2x/week  PT DURATION: 4 weeks  PLANNED INTERVENTIONS: Therapeutic exercises, Therapeutic activity, Patient/Family education, Self Care, Joint mobilization, Orthotic/Fit training, Manual lymph drainage, Compression bandaging, scar mobilization, Taping, Vasopneumatic device, Manual therapy, and Re-evaluation  PLAN FOR NEXT SESSION: PROM to L shoulder and MFR to L axilla for cording as able now healing from radiation, schedule SOZO out for around 01/21/24, cont and review MLD  to L breast and LUE, MFR to cording in L axilla  Idamae Lusher, PT 10/21/2023, 12:48 PM   Hug yourself.  Do circles at your neck just above your collarbones.  Repeat this 10 times.  Diaphragmatic - Supine   Inhale through nose making navel move out toward hands. Exhale through puckered lips, hands follow navel in. Repeat _5__ times. Rest _10__ seconds between repeats.    Axilla to Axilla - Sweep   On uninvolved side make 5 circles in the armpit, then pump _5__ times from involved armpit across chest to uninvolved armpit, making a pathway. Do _1__ time per day.  Copyright  VHI. All rights reserved.  Axilla to Inguinal Nodes - Sweep   On involved side, make 5 circles at groin at panty line, then pump _5__ times from armpit along side of trunk to outer hip, making your other pathway. Do __1_ time per day.  BREAST SEQUENCE: Draw an imaginary diagonal line from upper outer breast through the nipple area toward lower inner breast.  Direct fluid upward and inward from this line toward the pathway across your upper chest .  Do this in three rows to treat all of the upper inner breast tissue, and do each row 3-4x.      Direct fluid to treat all of lower outer breast tissue downward and outward toward pathway that is  aimed at the left groin.   Arm Posterior: Elbow to Shoulder - Sweep   Pump _5__ times from back of elbow to top of shoulder. Then inner to outer upper arm _5_ times, then outer arm again _5_ times. Then back to the pathways _2-3_ times. Do _1__ time per day.  Copyright  VHI. All rights reserved.  ARM: Volar Wrist to Elbow - Sweep   Pump or stationary circles _5__ times from wrist to elbow making sure to do both sides of the forearm. Then retrace your steps to the outer arm, and the pathways _2-3_ times each. Do _1__ time per day.  Copyright  VHI. All rights reserved.  ARM: Dorsum of Hand to Shoulder - Sweep   Pump or stationary circles _5__ times on back of hand including knuckle spaces and individual fingers if needed working up towards the wrist, then retrace all your steps working back up the forearm, doing both sides; upper outer arm and back to your pathways _2-3_ times each. Then do 5 circles again at uninvolved armpit and involved groin where you started! Good job!! Do __1_ time per day.  Cancer Rehab [phone]

## 2023-10-23 ENCOUNTER — Ambulatory Visit: Payer: Medicare Other

## 2023-10-23 ENCOUNTER — Encounter: Payer: Self-pay | Admitting: Rehabilitation

## 2023-10-23 ENCOUNTER — Ambulatory Visit: Payer: Medicare Other | Admitting: Rehabilitation

## 2023-10-23 ENCOUNTER — Telehealth: Payer: Self-pay

## 2023-10-23 VITALS — BP 146/80 | HR 69 | Temp 97.7°F | Resp 18 | Ht 69.5 in | Wt 218.4 lb

## 2023-10-23 DIAGNOSIS — Z171 Estrogen receptor negative status [ER-]: Secondary | ICD-10-CM | POA: Diagnosis not present

## 2023-10-23 DIAGNOSIS — R600 Localized edema: Secondary | ICD-10-CM

## 2023-10-23 DIAGNOSIS — E7800 Pure hypercholesterolemia, unspecified: Secondary | ICD-10-CM | POA: Diagnosis not present

## 2023-10-23 DIAGNOSIS — R293 Abnormal posture: Secondary | ICD-10-CM

## 2023-10-23 DIAGNOSIS — I2510 Atherosclerotic heart disease of native coronary artery without angina pectoris: Secondary | ICD-10-CM | POA: Diagnosis not present

## 2023-10-23 DIAGNOSIS — Z483 Aftercare following surgery for neoplasm: Secondary | ICD-10-CM

## 2023-10-23 DIAGNOSIS — M25612 Stiffness of left shoulder, not elsewhere classified: Secondary | ICD-10-CM | POA: Diagnosis not present

## 2023-10-23 DIAGNOSIS — T466X5A Adverse effect of antihyperlipidemic and antiarteriosclerotic drugs, initial encounter: Secondary | ICD-10-CM

## 2023-10-23 DIAGNOSIS — I890 Lymphedema, not elsewhere classified: Secondary | ICD-10-CM

## 2023-10-23 DIAGNOSIS — C50412 Malignant neoplasm of upper-outer quadrant of left female breast: Secondary | ICD-10-CM | POA: Diagnosis not present

## 2023-10-23 MED ORDER — INCLISIRAN SODIUM 284 MG/1.5ML ~~LOC~~ SOSY
284.0000 mg | PREFILLED_SYRINGE | Freq: Once | SUBCUTANEOUS | Status: AC
  Administered 2023-10-23: 284 mg via SUBCUTANEOUS
  Filled 2023-10-23: qty 1.5

## 2023-10-23 NOTE — Therapy (Signed)
OUTPATIENT PHYSICAL THERAPY  UPPER EXTREMITY ONCOLOGY TREATMENT  Patient Name: [name] [name] MRN: [id number] DOB:1951-02-22, 72 y.o., female Today's Date: 10/23/2023  END OF SESSION:  PT End of Session - 10/23/23 1001     Visit Number 10    Number of Visits 16    Date for PT Re-Evaluation 11/05/23    PT Start Time 1000    PT Stop Time 1043    PT Time Calculation (min) 43 min    Activity Tolerance Patient tolerated treatment well    Behavior During Therapy WFL for tasks assessed/performed                 Past Medical History:  Diagnosis Date   Adenomatous colon polyp    Allergy    Anxiety    Arthritis    bilateral hands. lower back   Cancer [facility])    skin   Cataract    bilateral   COPD (chronic obstructive pulmonary disease) (HCC)    Depression    Diverticulosis    Endometriosis    GERD (gastroesophageal reflux disease)    Glaucoma    Headache    Migraines   Hiatal hernia    History of left breast cancer 05/2023   IBS (irritable bowel syndrome)    Inguinal hernia    Pneumothorax 1987   PONV (postoperative nausea and vomiting)    Past Surgical History:  Procedure Laterality Date   ABDOMINAL HYSTERECTOMY  1992   ANTERIOR CERVICAL DECOMP/DISCECTOMY FUSION N/A 09/23/2022   Procedure: Anterior Decompression Fusion,PLATE/SCREWS Cervical five-six; REMOVAL OLD PLATE;  Surgeon: Tressie Stalker, MD;  Location: [facility] OR;  Service: Neurosurgery;  Laterality: N/A;   APPENDECTOMY  1992   BACK SURGERY     cervical   BREAST BIOPSY Left 06/23/2023   [place] LT BREAST BX W LOC DEV 1ST LESION IMG BX SPEC US GUIDE 06/23/2023 [facility]   BREAST BIOPSY Left 07/28/2023   [place] LT RADIOACTIVE SEED LOC 07/28/2023 [facility]   BREAST LUMPECTOMY WITH RADIOACTIVE SEED AND SENTINEL LYMPH NODE BIOPSY Left 07/29/2023   Procedure: LEFT BREAST SEED LUMPECTOMY WITH LEFT SENTINEL LYMPH NODE MAPPING;  Surgeon: Harriette Bouillon, MD;  Location: [facility] OR;  Service: General;  Laterality:  Left;  PEC BLOCK   CATARACT EXTRACTION Bilateral    2021, 2022   COLONOSCOPY  2023   PLEURAL SCARIFICATION     ruptured disk     torn tendon     right arm   TUBAL LIGATION     Patient Active Problem List   Diagnosis Date Noted   Genetic testing 07/11/2023   Malignant neoplasm of upper-outer quadrant of left breast in female, estrogen receptor negative (HCC) 06/30/2023   Cervical spondylosis with radiculopathy 09/23/2022   Elevated low-density lipoprotein level 06/14/2022   Coronary arteriosclerosis 06/14/2022   Adverse reaction to antihyperlipidemic drug, initial encounter 06/14/2022   Diarrhea 01/27/2019   Change in bowel habits 01/05/2019   Left inguinal hernia 06/27/2014   Bloating 05/17/2013   Constipation 05/17/2013   Pneumothorax 10/17/2008   HIATAL HERNIA 10/17/2008   DIVERTICULOSIS, COLON 10/17/2008   ENDOMETRIOSIS 10/17/2008   Right lower quadrant abdominal pain 10/17/2008   DEPRESSION, HX OF 10/17/2008   History of colonic polyps 10/17/2008   PNEUMOTHORAX 10/17/2008    PCP: Merri Brunette, MD   REFERRING PROVIDER: Rachel Moulds, MD  REFERRING DIAG: C50.412,Z17.1 (ICD-10-CM) - Malignant neoplasm of upper-outer quadrant of left breast in female, estrogen receptor negative (HCC) , L axillary cording  THERAPY DIAG:  Lymphedema, not elsewhere classified  Stiffness of left shoulder, not elsewhere classified  Aftercare following surgery for neoplasm  Malignant neoplasm of upper-outer quadrant of left breast in female, estrogen receptor negative (HCC)  Localized edema  Abnormal posture  ONSET DATE: 09/01/23  Rationale for Evaluation and Treatment: Rehabilitation  SUBJECTIVE:                                                                                                                                                                                           SUBJECTIVE STATEMENT: Nothing new   PERTINENT HISTORY: Patient was diagnosed on 08/15/2023 with left  grade 3 invasive ductal carcinoma breast cancer. She underwent a left lumpectomy and sentinel node biopsy (13 negative nodes) on 07/29/2023. It is triple negative with a Ki67 of 95%. She has a history of a cervical fusion in 08/2022. Plans to start radiation soon.   PAIN:  PAIN:  Are you having pain? Yes NPRS scale: 3/10 Pain location: L supraclavicular area Pain orientation: Left  PAIN TYPE: burning Pain description: constant  Aggravating factors: touching it Relieving factors: none reported   PRECAUTIONS: Other: L UE lymphedema risk, cervical fusion C6, C7  RED FLAGS: Compression fracture: Yes: T12    WEIGHT BEARING RESTRICTIONS: No  FALLS:  Has patient fallen in last 6 months? No  LIVING ENVIRONMENT: Lives with: lives with their spouse Lives in: House/apartment Stairs: Yes; External: 5 steps; can reach both Has following equipment at home: None  OCCUPATION: locksmith  LEISURE: was walking until last week but recently stopped due to stomach issues  HAND DOMINANCE: right   PRIOR LEVEL OF FUNCTION: Independent  PATIENT GOALS: to get my arm back to normal   OBJECTIVE:  COGNITION: Overall cognitive status: Within functional limits for tasks assessed   PALPATION: Cording palpable in L axilla, increased edema noticeable in LUE and L breast  OBSERVATIONS / OTHER ASSESSMENTS: L breast more full than R especially at lateral breast  POSTURE: forward head and rounded shoulders  UPPER EXTREMITY AROM/PROM:  A/PROM RIGHT   eval   Shoulder extension 70  Shoulder flexion 167  Shoulder abduction 166  Shoulder internal rotation 65  Shoulder external rotation 78    (Blank rows = not tested)  A/PROM LEFT   eval  Shoulder extension 65  Shoulder flexion 155  Shoulder abduction 160  Shoulder internal rotation 55  Shoulder external rotation 83    (Blank rows = not tested)  LYMPHEDEMA ASSESSMENTS:  SURGERY TYPE/DATE: 07/29/23 L lumpectomy and SLNB NUMBER OF LYMPH NODES  REMOVED: 13 CHEMOTHERAPY: pt declined chemo RADIATION: plans to begin radiation soon HORMONE TREATMENT: none INFECTIONS: none  LYMPHEDEMA ASSESSMENTS:   LANDMARK RIGHT  eval  At axilla  35.5  15 cm proximal to olecranon process 30.8  10 cm proximal to olecranon process 30  Olecranon process 26.5  15 cm proximal to ulnar styloid process 23.9  10 cm proximal to ulnar styloid process 20.4  Just proximal to ulnar styloid process 17.4  Across hand at thumb web space 19.5  At base of 2nd digit 6.5  (Blank rows = not tested)  LANDMARK LEFT  eval  At axilla  35.9  15 cm proximal to olecranon process 32.5  10 cm proximal to olecranon process 31.5  Olecranon process 28  15 cm proximal to ulnar styloid process 24.5  10 cm proximal to ulnar styloid process 20.6  Just proximal to ulnar styloid process 18.1  Across hand at thumb web space 20.6  At base of 2nd digit 6.4  (Blank rows = not tested)  TODAY'S TREATMENT:                                                                                                                                          DATE: 10/23/23 Pulleys into flexion and abduction x to work on stretching and ROM and cording,  vcs to slow down and hold at the top longer.  Wall ball flexion x 4 bil with vcs to not look up due to neck pain Row x 5 with reports of increased neck pain.  Manual lymph drainage in supine as follows: short neck, 5 diaphragmatic breaths; right axillary nodes, anterior inter-axillary anastomoses, left upper extremity upper arm only working from proximal to distal  STM with arm propped overhead with cocoa butter to axilla where pt feels cording avoiding radiation site - gentle pressure  PROM to tolerance  10/21/23 Redid SOZO which is back in the green at 2.0 - emphasized to pt that she is in the highest risk right now and she should continue wearing the sleeve.   Breast is red with a patch of more fragile appearing skin in the axilla so  focused on MLD of the UE today.   Manual lymph drainage in supine as follows: short neck, 5 diaphragmatic breaths; right axillary nodes, anterior inter-axillary anastomoses, left inguinal nodes, left axillo-inguinal anastomosis, left upper extremity working from proximal to distal down to fingers and dorsal hand and back to lateral shoulder redirecting along pathways.  PROM into flexion x 3   10/08/23 Manual lymph drainage in supine as follows: short neck, 5 diaphragmatic breaths; right axillary nodes, anterior inter-axillary anastomoses, left inguinal nodes, left axillo-inguinal anastomoses, then left breast, left upper extremity working from proximal to distal down to fingers and dorsal hand and back to lateral shoulder redirecting along pathways.  MFR gently to L drain site where there is a small cord palpable  10/06/23 Discussed on going fullness at L superclavicular region. Had Dwaine Gale, PT also look and  assess since this is not a typical sign with UE lymphedema. She did have a scan in Sept of her neck and no evidence of lymphadenopathy was found at that time. Discussed with pt since she had 13 nodes removed it could be lymphedema since that area is an easy area for fluid to expand in to.  Continued Manual lymph drainage in supine as follows: short neck, right axillary nodes, left inguinal nodes, superficial and deep abdominals; anterior inter-axillary anastamoses, left axillo-inguinal anastamoses. Left upper extremity from fingers and dorsal hand to lateral shoulder redirecting along pathways. Spent extra time at L supraclavicular region and pt reported increased comfort by end of session. Encouraged her to try her sleeve when she gets home.   10/01/23 See assessment   09/29/23: Manual therapy Continued to instruct pt in proper technqiue and answered her questions regarding breast MLD having her return demo each step and cuing offered throughout to adjust for lighter pressure and direction of  skin stretch: Manual lymph drainage in supine as follows: short neck, 5 diaphragmatic breaths; right axillary nodes, anterior inter-axillary anastomoses, left inguinal nodes, left axillo-inguinal anastomoses, then left breast, left upper extremity working from proximal to distal down to fingers and dorsal hand and back to lateral shoulder redirecting along pathways.  MFR gently to L axilla where there is a thick cord and to antecubital fossa over area of cording, this less palpable today  09/24/23: Manual therapy Instructed pt in following today having her return demo of each step and cuing offered throughout to adjust for lighter pressure: Manual lymph drainage in supine as follows: short neck, 5 diaphragmatic breaths; right axillary nodes, anterior inter-axillary anastomoses, left inguinal nodes, left axillo-inguinal anastomoses, then left breast, left upper extremity working from proximal to distal down to fingers and dorsal hand and back to lateral shoulder redirecting along pathways.  P/ROM to Lt shoulder into flex, abd and D2 gently to pts tolerance and with scapular depression throughout by therapist; multiple VC's throughout to relax due to guarding but less so today MFR gently to medial Lt upper arm and antecubital fossa over area of cording, this less palpable today  09/22/23: Manual therapy Manual lymph drainage in supine as follows: short neck, superficial and deep abdominals; right axillary nodes, right anterior intact thorax, anterior inter-axillary anastomoses, left inguinal nodes, left axillo-inguinal anastomoses, then left breast, left upper extremity from fingers and dorsal hand to lateral shoulder redirecting along pathways.  P/ROM to Lt shoulder into flex, abd and D2 gently to pts tolerance and with scapular depression throughout by therapist; multiple VC's throughout to relax due to guarding but less so today MFR gently to medial Lt upper arm and antecubital fossa over area of  cording, this less palpable today   09/18/23: Manual therapy Manual lymph drainage in supine as follows: short neck, superficial and deep abdominals; right axillary nodes, right anterior intact thorax, anterior inter-axillary anastomoses, left inguinal nodes, left axillo-inguinal anastomoses, then left breast, left upper extremity from fingers and dorsal hand to lateral shoulder redirecting along pathways.  P/ROM to Lt shoulder into flex, abd and D2 gently to pts tolerance and with scapular depression throughout by therapist; multiple VC's throughout to relax due to guarding.  MFR gently to medial Lt upper arm and antecubital fossa over area of cording; one small pop felt by pt and therapist at end of session over antecubital fossa STM in Rt S/L to Lt lateral trunk with cocoa butter Scap Mobs in Rt S/L to Lt scapula into protraction  and retraction Cut and issued TG soft size medium for pt to wear in lieu of her compression sleeve until her new one arrives.   09/16/23: Measured pt for a new compression sleeve since the one given last session is too short and she is having swelling in her hand. She fit in to an off the shelf Sigvaris Secure size M2 (long) and medium glove - issued info for pt to obtain this online Pulleys x 2 min in direction of flexion and 2 min in direction of abduction MFR throughout LUE to help decrease cording with numerous cords palpable and a large thick cord palpable in L axilla Began Manual lymph drainage in supine as follows: short neck, right axillary nodes, left inguinal nodes, superficial and deep abdominals; anterior inter-axillary anastamoses, left axillo-inguinal anastamoses. Left upper extremity from fingers and dorsal hand to lateral shoulder redirecting along pathways. Instructed pt throughout in sequence, skin stretch and anatomy and physiology of the lymphatic system.    09/09/23: Pulleys x 2 min in direction of flexion and 2 min in direction of abduction with pt  returning therapist demo and feeling less discomfort at end of pulleys Measured pt for compression sleeve after increased Ldex score: Medi Harmony Size 3 EW and issued this to her    PATIENT EDUCATION:  Education details: Self MLD Person educated: Patient Education method: Medical illustrator, handout given Education comprehension: verbalized understanding and returned demonstration, will benefit from further review  HOME EXERCISE PROGRAM: Continue post op exercises Over the door pulleys Wear compression sleeve 12 hours a day for 4 weeks 09/24/23: Self MLD  ASSESSMENT:  CLINICAL IMPRESSION: Pt is feeling better today in terms of less pain and itching from radiation. Still avoided the radiation area with MLD. Pt feels cording like pull with overhead reach but not palpated directly in the axilla - more of a tightness.   OBJECTIVE IMPAIRMENTS: decreased knowledge of condition, decreased knowledge of use of DME, decreased ROM, increased edema, increased fascial restrictions, impaired UE functional use, postural dysfunction, and pain.   ACTIVITY LIMITATIONS: carrying, lifting, and reach over head  PARTICIPATION LIMITATIONS: meal prep, cleaning, laundry, and community activity  PERSONAL FACTORS:  none  are also affecting patient's functional outcome.   REHAB POTENTIAL: Good  CLINICAL DECISION MAKING: Stable/uncomplicated  EVALUATION COMPLEXITY: Low  GOALS: Goals reviewed with patient? Yes  SHORT TERM GOALS=LONG TERM GOALS Target date: 10/07/23  Pt will be independent with self MLD for L UE and L breast for long term management of lymphedema. Baseline: Goal status: ONGOING 10/08/23  2.  Pt will return to the green on SOZO demonstrating reversal of subclinical lymphedema. Baseline:  Goal status: ONGOING 10/08/23  3.  Pt will demonstrate 165 degrees of L shoulder flexion to allow her to reach overhead.  Baseline:  Goal status: ONGOING 10/08/23  4.  Pt will be  independent in a home exercise program for continued stretching and strengthening.  Baseline:  Goal status: ONGOING 10/08/23  5.  Pt will report a 75% improvement in pain and discomfort from axillary cording to allow improved comfort.  Baseline:  Goal status: ONGOING 10/08/23- still presents with cording and discomfort in axilla  6.  Pt will obtain appropriate compression garments - sleeve and bra for long term management.  Baseline:  Goal status: MET 10/08/23 has sleeve and compression bra  PLAN:  PT FREQUENCY: 2x/week  PT DURATION: 4 weeks  PLANNED INTERVENTIONS: Therapeutic exercises, Therapeutic activity, Patient/Family education, Self Care, Joint mobilization, Orthotic/Fit training, Manual  lymph drainage, Compression bandaging, scar mobilization, Taping, Vasopneumatic device, Manual therapy, and Re-evaluation  PLAN FOR NEXT SESSION: PROM to L shoulder and MFR to L axilla for cording as able now healing from radiation, schedule SOZO out for around 01/21/24, cont and review MLD to L breast and LUE, MFR to cording in L axilla  Juel Bellerose, Julieanne Manson, PT 10/23/2023, 10:44 AM   Hug yourself.  Do circles at your neck just above your collarbones.  Repeat this 10 times.  Diaphragmatic - Supine   Inhale through nose making navel move out toward hands. Exhale through puckered lips, hands follow navel in. Repeat _5__ times. Rest _10__ seconds between repeats.    Axilla to Axilla - Sweep   On uninvolved side make 5 circles in the armpit, then pump _5__ times from involved armpit across chest to uninvolved armpit, making a pathway. Do _1__ time per day.  Copyright  VHI. All rights reserved.  Axilla to Inguinal Nodes - Sweep   On involved side, make 5 circles at groin at panty line, then pump _5__ times from armpit along side of trunk to outer hip, making your other pathway. Do __1_ time per day.  BREAST SEQUENCE: Draw an imaginary diagonal line from upper outer breast through the nipple  area toward lower inner breast.  Direct fluid upward and inward from this line toward the pathway across your upper chest .  Do this in three rows to treat all of the upper inner breast tissue, and do each row 3-4x.      Direct fluid to treat all of lower outer breast tissue downward and outward toward pathway that is aimed at the left groin.   Arm Posterior: Elbow to Shoulder - Sweep   Pump _5__ times from back of elbow to top of shoulder. Then inner to outer upper arm _5_ times, then outer arm again _5_ times. Then back to the pathways _2-3_ times. Do _1__ time per day.  Copyright  VHI. All rights reserved.  ARM: Volar Wrist to Elbow - Sweep   Pump or stationary circles _5__ times from wrist to elbow making sure to do both sides of the forearm. Then retrace your steps to the outer arm, and the pathways _2-3_ times each. Do _1__ time per day.  Copyright  VHI. All rights reserved.  ARM: Dorsum of Hand to Shoulder - Sweep   Pump or stationary circles _5__ times on back of hand including knuckle spaces and individual fingers if needed working up towards the wrist, then retrace all your steps working back up the forearm, doing both sides; upper outer arm and back to your pathways _2-3_ times each. Then do 5 circles again at uninvolved armpit and involved groin where you started! Good job!! Do __1_ time per day.  [facility] [phone]

## 2023-10-23 NOTE — Progress Notes (Signed)
Diagnosis: Hyperlipidemia  Provider:  Chilton Greathouse MD  Procedure: Injection  Leqvio (inclisiran), Dose: 284 mg, Site: subcutaneous, Number of injections: 1  Administered in right anterior thigh.  Post Care: Patient declined observation  Discharge: Condition: Good, Destination: Home . AVS Declined  Performed by:  Wyvonne Lenz, RN

## 2023-10-23 NOTE — Telephone Encounter (Signed)
Auth Submission: NO AUTH NEEDED Site of care: Site of care: [facility] Payer: Medicare A/B plus BCBS supplement Medication & CPT/J Code(s) submitted: Leqvio (Inclisiran) J1306 Route of submission (phone, fax, portal):  Phone # Fax # Auth type: Buy/Bill PB Units/visits requested: 284mg  x 1 dose Reference number:  Approval from: 10/23/23 to 01/30/24

## 2023-10-27 ENCOUNTER — Encounter: Payer: Self-pay | Admitting: Physical Therapy

## 2023-10-27 ENCOUNTER — Ambulatory Visit: Payer: Medicare Other | Admitting: Physical Therapy

## 2023-10-27 ENCOUNTER — Ambulatory Visit: Payer: Self-pay

## 2023-10-27 DIAGNOSIS — M25612 Stiffness of left shoulder, not elsewhere classified: Secondary | ICD-10-CM

## 2023-10-27 DIAGNOSIS — R600 Localized edema: Secondary | ICD-10-CM | POA: Diagnosis not present

## 2023-10-27 DIAGNOSIS — I890 Lymphedema, not elsewhere classified: Secondary | ICD-10-CM

## 2023-10-27 DIAGNOSIS — C50412 Malignant neoplasm of upper-outer quadrant of left female breast: Secondary | ICD-10-CM | POA: Diagnosis not present

## 2023-10-27 DIAGNOSIS — Z171 Estrogen receptor negative status [ER-]: Secondary | ICD-10-CM | POA: Diagnosis not present

## 2023-10-27 DIAGNOSIS — Z483 Aftercare following surgery for neoplasm: Secondary | ICD-10-CM | POA: Diagnosis not present

## 2023-10-27 NOTE — Therapy (Signed)
OUTPATIENT PHYSICAL THERAPY  UPPER EXTREMITY ONCOLOGY TREATMENT  Patient Name: ALINEA SIESS MRN: [id number] DOB:1951-08-05, 72 y.o., female Today's Date: 10/27/2023  END OF SESSION:  PT End of Session - 10/27/23 1506     Visit Number 11    Number of Visits 16    Date for PT Re-Evaluation 11/05/23    PT Start Time 1505    PT Stop Time 1550    PT Time Calculation (min) 45 min    Activity Tolerance Patient tolerated treatment well    Behavior During Therapy WFL for tasks assessed/performed                 Past Medical History:  Diagnosis Date   Adenomatous colon polyp    Allergy    Anxiety    Arthritis    bilateral hands. lower back   Cancer [facility])    skin   Cataract    bilateral   COPD (chronic obstructive pulmonary disease) (HCC)    Depression    Diverticulosis    Endometriosis    GERD (gastroesophageal reflux disease)    Glaucoma    Headache    Migraines   Hiatal hernia    History of left breast cancer 05/2023   IBS (irritable bowel syndrome)    Inguinal hernia    Pneumothorax 1987   PONV (postoperative nausea and vomiting)    Past Surgical History:  Procedure Laterality Date   ABDOMINAL HYSTERECTOMY  1992   ANTERIOR CERVICAL DECOMP/DISCECTOMY FUSION N/A 09/23/2022   Procedure: Anterior Decompression Fusion,PLATE/SCREWS Cervical five-six; REMOVAL OLD PLATE;  Surgeon: Tressie Stalker, MD;  Location: [facility] OR;  Service: Neurosurgery;  Laterality: N/A;   APPENDECTOMY  1992   BACK SURGERY     cervical   BREAST BIOPSY Left 06/23/2023   [place] LT BREAST BX W LOC DEV 1ST LESION IMG BX SPEC US GUIDE 06/23/2023 [facility] MAMMOGRAP[facility]   BREAST BIOPSY Left 07/28/2023   [place] LT RADIOACTIVE SEED LOC 07/28/2023 [facility]   BREAST LUMPECTOMY WITH RADIOACTIVE SEED AND SENTINEL LYMPH NODE BIOPSY Left 07/29/2023   Procedure: LEFT BREAST SEED LUMPECTOMY WITH LEFT SENTINEL LYMPH NODE MAPPING;  Surgeon: Harriette Bouillon, MD;  Location: [facility] OR;  Service: General;  Laterality:  Left;  PEC BLOCK   CATARACT EXTRACTION Bilateral    2021, 2022   COLONOSCOPY  2023   PLEURAL SCARIFICATION     ruptured disk     torn tendon     right arm   TUBAL LIGATION     Patient Active Problem List   Diagnosis Date Noted   Genetic testing 07/11/2023   Malignant neoplasm of upper-outer quadrant of left breast in female, estrogen receptor negative (HCC) 06/30/2023   Cervical spondylosis with radiculopathy 09/23/2022   Elevated low-density lipoprotein level 06/14/2022   Coronary arteriosclerosis 06/14/2022   Adverse reaction to antihyperlipidemic drug, initial encounter 06/14/2022   Diarrhea 01/27/2019   Change in bowel habits 01/05/2019   Left inguinal hernia 06/27/2014   Bloating 05/17/2013   Constipation 05/17/2013   Pneumothorax 10/17/2008   HIATAL HERNIA 10/17/2008   DIVERTICULOSIS, COLON 10/17/2008   ENDOMETRIOSIS 10/17/2008   Right lower quadrant abdominal pain 10/17/2008   DEPRESSION, HX OF 10/17/2008   History of colonic polyps 10/17/2008   PNEUMOTHORAX 10/17/2008    PCP: Merri Brunette, MD   REFERRING PROVIDER: Rachel Moulds, MD  REFERRING DIAG: C50.412,Z17.1 (ICD-10-CM) - Malignant neoplasm of upper-outer quadrant of left breast in female, estrogen receptor negative (HCC) , L axillary cording  THERAPY DIAG:  Lymphedema, not elsewhere classified  Stiffness of left shoulder, not elsewhere classified  Aftercare following surgery for neoplasm  Malignant neoplasm of upper-outer quadrant of left breast in female, estrogen receptor negative (HCC)  ONSET DATE: 09/01/23  Rationale for Evaluation and Treatment: Rehabilitation  SUBJECTIVE:                                                                                                                                                                                           SUBJECTIVE STATEMENT: I am still having swelling near my collarbone. I still have pain in my L armpit.   PERTINENT HISTORY: Patient was  diagnosed on 08/15/2023 with left grade 3 invasive ductal carcinoma breast cancer. She underwent a left lumpectomy and sentinel node biopsy (13 negative nodes) on 07/29/2023. It is triple negative with a Ki67 of 95%. She has a history of a cervical fusion in 08/2022. Plans to start radiation soon.   PAIN:  PAIN:  Are you having pain? Yes NPRS scale: 3/10 Pain location: L axilla Pain orientation: Left  PAIN TYPE: burning Pain description: constant  Aggravating factors: touching it Relieving factors: none reported   PRECAUTIONS: Other: L UE lymphedema risk, cervical fusion C6, C7  RED FLAGS: Compression fracture: Yes: T12    WEIGHT BEARING RESTRICTIONS: No  FALLS:  Has patient fallen in last 6 months? No  LIVING ENVIRONMENT: Lives with: lives with their spouse Lives in: House/apartment Stairs: Yes; External: 5 steps; can reach both Has following equipment at home: None  OCCUPATION: locksmith  LEISURE: was walking until last week but recently stopped due to stomach issues  HAND DOMINANCE: right   PRIOR LEVEL OF FUNCTION: Independent  PATIENT GOALS: to get my arm back to normal   OBJECTIVE:  COGNITION: Overall cognitive status: Within functional limits for tasks assessed   PALPATION: Cording palpable in L axilla, increased edema noticeable in LUE and L breast  OBSERVATIONS / OTHER ASSESSMENTS: L breast more full than R especially at lateral breast  POSTURE: forward head and rounded shoulders  UPPER EXTREMITY AROM/PROM:  A/PROM RIGHT   eval   Shoulder extension 70  Shoulder flexion 167  Shoulder abduction 166  Shoulder internal rotation 65  Shoulder external rotation 78    (Blank rows = not tested)  A/PROM LEFT   eval  Shoulder extension 65  Shoulder flexion 155  Shoulder abduction 160  Shoulder internal rotation 55  Shoulder external rotation 83    (Blank rows = not tested)  LYMPHEDEMA ASSESSMENTS:  SURGERY TYPE/DATE: 07/29/23 L lumpectomy and  SLNB NUMBER OF LYMPH NODES REMOVED: 13 CHEMOTHERAPY: pt declined chemo RADIATION: plans to begin  radiation soon HORMONE TREATMENT: none INFECTIONS: none  LYMPHEDEMA ASSESSMENTS:   LANDMARK RIGHT  eval  At axilla  35.5  15 cm proximal to olecranon process 30.8  10 cm proximal to olecranon process 30  Olecranon process 26.5  15 cm proximal to ulnar styloid process 23.9  10 cm proximal to ulnar styloid process 20.4  Just proximal to ulnar styloid process 17.4  Across hand at thumb web space 19.5  At base of 2nd digit 6.5  (Blank rows = not tested)  LANDMARK LEFT  eval  At axilla  35.9  15 cm proximal to olecranon process 32.5  10 cm proximal to olecranon process 31.5  Olecranon process 28  15 cm proximal to ulnar styloid process 24.5  10 cm proximal to ulnar styloid process 20.6  Just proximal to ulnar styloid process 18.1  Across hand at thumb web space 20.6  At base of 2nd digit 6.4  (Blank rows = not tested)  TODAY'S TREATMENT:                                                                                                                                          DATE: 10/27/23 Manual lymph drainage in supine as follows: short neck, 5 diaphragmatic breaths; right axillary nodes, anterior inter-axillary anastomoses, left upper extremity upper arm only working from proximal to distal  STM with arm propped overhead with to axilla where pt feels cording avoiding radiation site - gentle pressure and to serratus where pt has several trigger points that improved by end of session PROM to tolerance  10/23/23 Pulleys into flexion and abduction x to work on stretching and ROM and cording,  vcs to slow down and hold at the top longer.  Wall ball flexion x 4 bil with vcs to not look up due to neck pain Row x 5 with reports of increased neck pain.  Manual lymph drainage in supine as follows: short neck, 5 diaphragmatic breaths; right axillary nodes, anterior inter-axillary  anastomoses, left upper extremity upper arm only working from proximal to distal  STM with arm propped overhead with cocoa butter to axilla where pt feels cording avoiding radiation site - gentle pressure  PROM to tolerance  10/21/23 Redid SOZO which is back in the green at 2.0 - emphasized to pt that she is in the highest risk right now and she should continue wearing the sleeve.   Breast is red with a patch of more fragile appearing skin in the axilla so focused on MLD of the UE today.   Manual lymph drainage in supine as follows: short neck, 5 diaphragmatic breaths; right axillary nodes, anterior inter-axillary anastomoses, left inguinal nodes, left axillo-inguinal anastomosis, left upper extremity working from proximal to distal down to fingers and dorsal hand and back to lateral shoulder redirecting along pathways.  PROM into flexion x 3   10/08/23 Manual lymph drainage in  supine as follows: short neck, 5 diaphragmatic breaths; right axillary nodes, anterior inter-axillary anastomoses, left inguinal nodes, left axillo-inguinal anastomoses, then left breast, left upper extremity working from proximal to distal down to fingers and dorsal hand and back to lateral shoulder redirecting along pathways.  MFR gently to L drain site where there is a small cord palpable  10/06/23 Discussed on going fullness at L superclavicular region. Had Dwaine Gale, PT also look and assess since this is not a typical sign with UE lymphedema. She did have a scan in Sept of her neck and no evidence of lymphadenopathy was found at that time. Discussed with pt since she had 13 nodes removed it could be lymphedema since that area is an easy area for fluid to expand in to.  Continued Manual lymph drainage in supine as follows: short neck, right axillary nodes, left inguinal nodes, superficial and deep abdominals; anterior inter-axillary anastamoses, left axillo-inguinal anastamoses. Left upper extremity from fingers and dorsal  hand to lateral shoulder redirecting along pathways. Spent extra time at L supraclavicular region and pt reported increased comfort by end of session. Encouraged her to try her sleeve when she gets home.   10/01/23 See assessment   09/29/23: Manual therapy Continued to instruct pt in proper technqiue and answered her questions regarding breast MLD having her return demo each step and cuing offered throughout to adjust for lighter pressure and direction of skin stretch: Manual lymph drainage in supine as follows: short neck, 5 diaphragmatic breaths; right axillary nodes, anterior inter-axillary anastomoses, left inguinal nodes, left axillo-inguinal anastomoses, then left breast, left upper extremity working from proximal to distal down to fingers and dorsal hand and back to lateral shoulder redirecting along pathways.  MFR gently to L axilla where there is a thick cord and to antecubital fossa over area of cording, this less palpable today  09/24/23: Manual therapy Instructed pt in following today having her return demo of each step and cuing offered throughout to adjust for lighter pressure: Manual lymph drainage in supine as follows: short neck, 5 diaphragmatic breaths; right axillary nodes, anterior inter-axillary anastomoses, left inguinal nodes, left axillo-inguinal anastomoses, then left breast, left upper extremity working from proximal to distal down to fingers and dorsal hand and back to lateral shoulder redirecting along pathways.  P/ROM to Lt shoulder into flex, abd and D2 gently to pts tolerance and with scapular depression throughout by therapist; multiple VC's throughout to relax due to guarding but less so today MFR gently to medial Lt upper arm and antecubital fossa over area of cording, this less palpable today  09/22/23: Manual therapy Manual lymph drainage in supine as follows: short neck, superficial and deep abdominals; right axillary nodes, right anterior intact thorax, anterior  inter-axillary anastomoses, left inguinal nodes, left axillo-inguinal anastomoses, then left breast, left upper extremity from fingers and dorsal hand to lateral shoulder redirecting along pathways.  P/ROM to Lt shoulder into flex, abd and D2 gently to pts tolerance and with scapular depression throughout by therapist; multiple VC's throughout to relax due to guarding but less so today MFR gently to medial Lt upper arm and antecubital fossa over area of cording, this less palpable today   09/18/23: Manual therapy Manual lymph drainage in supine as follows: short neck, superficial and deep abdominals; right axillary nodes, right anterior intact thorax, anterior inter-axillary anastomoses, left inguinal nodes, left axillo-inguinal anastomoses, then left breast, left upper extremity from fingers and dorsal hand to lateral shoulder redirecting along pathways.  P/ROM to Lt  shoulder into flex, abd and D2 gently to pts tolerance and with scapular depression throughout by therapist; multiple VC's throughout to relax due to guarding.  MFR gently to medial Lt upper arm and antecubital fossa over area of cording; one small pop felt by pt and therapist at end of session over antecubital fossa STM in Rt S/L to Lt lateral trunk with cocoa butter Scap Mobs in Rt S/L to Lt scapula into protraction and retraction Cut and issued TG soft size medium for pt to wear in lieu of her compression sleeve until her new one arrives.   09/16/23: Measured pt for a new compression sleeve since the one given last session is too short and she is having swelling in her hand. She fit in to an off the shelf Sigvaris Secure size M2 (long) and medium glove - issued info for pt to obtain this online Pulleys x 2 min in direction of flexion and 2 min in direction of abduction MFR throughout LUE to help decrease cording with numerous cords palpable and a large thick cord palpable in L axilla Began Manual lymph drainage in supine as follows:  short neck, right axillary nodes, left inguinal nodes, superficial and deep abdominals; anterior inter-axillary anastamoses, left axillo-inguinal anastamoses. Left upper extremity from fingers and dorsal hand to lateral shoulder redirecting along pathways. Instructed pt throughout in sequence, skin stretch and anatomy and physiology of the lymphatic system.    09/09/23: Pulleys x 2 min in direction of flexion and 2 min in direction of abduction with pt returning therapist demo and feeling less discomfort at end of pulleys Measured pt for compression sleeve after increased Ldex score: Medi Harmony Size 3 EW and issued this to her    PATIENT EDUCATION:  Education details: Self MLD Person educated: Patient Education method: Medical illustrator, handout given Education comprehension: verbalized understanding and returned demonstration, will benefit from further review  HOME EXERCISE PROGRAM: Continue post op exercises Over the door pulleys Wear compression sleeve 12 hours a day for 4 weeks 09/24/23: Self MLD  ASSESSMENT:  CLINICAL IMPRESSION: Continued to work on decreasing discomfort in L axilla and L trunk with STM avoid radiated area. Continued with MLD to LUE avoiding breast due to continued redness from radiation. Areas of trigger points in L serratus improved by end of session.   OBJECTIVE IMPAIRMENTS: decreased knowledge of condition, decreased knowledge of use of DME, decreased ROM, increased edema, increased fascial restrictions, impaired UE functional use, postural dysfunction, and pain.   ACTIVITY LIMITATIONS: carrying, lifting, and reach over head  PARTICIPATION LIMITATIONS: meal prep, cleaning, laundry, and community activity  PERSONAL FACTORS:  none  are also affecting patient's functional outcome.   REHAB POTENTIAL: Good  CLINICAL DECISION MAKING: Stable/uncomplicated  EVALUATION COMPLEXITY: Low  GOALS: Goals reviewed with patient? Yes  SHORT TERM  GOALS=LONG TERM GOALS Target date: 10/07/23  Pt will be independent with self MLD for L UE and L breast for long term management of lymphedema. Baseline: Goal status: ONGOING 10/08/23  2.  Pt will return to the green on SOZO demonstrating reversal of subclinical lymphedema. Baseline:  Goal status: ONGOING 10/08/23  3.  Pt will demonstrate 165 degrees of L shoulder flexion to allow her to reach overhead.  Baseline:  Goal status: ONGOING 10/08/23  4.  Pt will be independent in a home exercise program for continued stretching and strengthening.  Baseline:  Goal status: ONGOING 10/08/23  5.  Pt will report a 75% improvement in pain and discomfort from  axillary cording to allow improved comfort.  Baseline:  Goal status: ONGOING 10/08/23- still presents with cording and discomfort in axilla  6.  Pt will obtain appropriate compression garments - sleeve and bra for long term management.  Baseline:  Goal status: MET 10/08/23 has sleeve and compression bra  PLAN:  PT FREQUENCY: 2x/week  PT DURATION: 4 weeks  PLANNED INTERVENTIONS: Therapeutic exercises, Therapeutic activity, Patient/Family education, Self Care, Joint mobilization, Orthotic/Fit training, Manual lymph drainage, Compression bandaging, scar mobilization, Taping, Vasopneumatic device, Manual therapy, and Re-evaluation  PLAN FOR NEXT SESSION: PROM to L shoulder and MFR to L axilla for cording as able now healing from radiation, schedule SOZO out for around 01/21/24, cont and review MLD to L breast and LUE, MFR to cording in L axilla  Patrecia Veiga Breedlove Blue, PT 10/27/2023, 3:55 PM   Hug yourself.  Do circles at your neck just above your collarbones.  Repeat this 10 times.  Diaphragmatic - Supine   Inhale through nose making navel move out toward hands. Exhale through puckered lips, hands follow navel in. Repeat _5__ times. Rest _10__ seconds between repeats.    Axilla to Axilla - Sweep   On uninvolved side make 5 circles in  the armpit, then pump _5__ times from involved armpit across chest to uninvolved armpit, making a pathway. Do _1__ time per day.  Copyright  VHI. All rights reserved.  Axilla to Inguinal Nodes - Sweep   On involved side, make 5 circles at groin at panty line, then pump _5__ times from armpit along side of trunk to outer hip, making your other pathway. Do __1_ time per day.  BREAST SEQUENCE: Draw an imaginary diagonal line from upper outer breast through the nipple area toward lower inner breast.  Direct fluid upward and inward from this line toward the pathway across your upper chest .  Do this in three rows to treat all of the upper inner breast tissue, and do each row 3-4x.      Direct fluid to treat all of lower outer breast tissue downward and outward toward pathway that is aimed at the left groin.   Arm Posterior: Elbow to Shoulder - Sweep   Pump _5__ times from back of elbow to top of shoulder. Then inner to outer upper arm _5_ times, then outer arm again _5_ times. Then back to the pathways _2-3_ times. Do _1__ time per day.  Copyright  VHI. All rights reserved.  ARM: Volar Wrist to Elbow - Sweep   Pump or stationary circles _5__ times from wrist to elbow making sure to do both sides of the forearm. Then retrace your steps to the outer arm, and the pathways _2-3_ times each. Do _1__ time per day.  Copyright  VHI. All rights reserved.  ARM: Dorsum of Hand to Shoulder - Sweep   Pump or stationary circles _5__ times on back of hand including knuckle spaces and individual fingers if needed working up towards the wrist, then retrace all your steps working back up the forearm, doing both sides; upper outer arm and back to your pathways _2-3_ times each. Then do 5 circles again at uninvolved armpit and involved groin where you started! Good job!! Do __1_ time per day.  Cancer Rehab [phone]

## 2023-10-28 ENCOUNTER — Ambulatory Visit: Payer: Medicare Other | Admitting: Gastroenterology

## 2023-10-29 ENCOUNTER — Encounter: Payer: Self-pay | Admitting: Physical Therapy

## 2023-10-29 ENCOUNTER — Ambulatory Visit: Payer: Medicare Other | Admitting: Physical Therapy

## 2023-10-29 DIAGNOSIS — C50412 Malignant neoplasm of upper-outer quadrant of left female breast: Secondary | ICD-10-CM | POA: Diagnosis not present

## 2023-10-29 DIAGNOSIS — M25612 Stiffness of left shoulder, not elsewhere classified: Secondary | ICD-10-CM | POA: Diagnosis not present

## 2023-10-29 DIAGNOSIS — Z483 Aftercare following surgery for neoplasm: Secondary | ICD-10-CM | POA: Diagnosis not present

## 2023-10-29 DIAGNOSIS — I890 Lymphedema, not elsewhere classified: Secondary | ICD-10-CM

## 2023-10-29 DIAGNOSIS — R600 Localized edema: Secondary | ICD-10-CM | POA: Diagnosis not present

## 2023-10-29 DIAGNOSIS — Z171 Estrogen receptor negative status [ER-]: Secondary | ICD-10-CM | POA: Diagnosis not present

## 2023-10-29 NOTE — Therapy (Signed)
OUTPATIENT PHYSICAL THERAPY  UPPER EXTREMITY ONCOLOGY TREATMENT  Patient Name: [name] [name] MRN: [id number] DOB:05-22-1951, 72 y.o., female Today's Date: 10/29/2023  END OF SESSION:  PT End of Session - 10/29/23 1103     Visit Number 12    Number of Visits 16    Date for PT Re-Evaluation 11/05/23    PT Start Time 1102    PT Stop Time 1157    PT Time Calculation (min) 55 min    Activity Tolerance Patient tolerated treatment well    Behavior During Therapy WFL for tasks assessed/performed                 Past Medical History:  Diagnosis Date   Adenomatous colon polyp    Allergy    Anxiety    Arthritis    bilateral hands. lower back   Cancer [facility])    skin   Cataract    bilateral   COPD (chronic obstructive pulmonary disease) (HCC)    Depression    Diverticulosis    Endometriosis    GERD (gastroesophageal reflux disease)    Glaucoma    Headache    Migraines   Hiatal hernia    History of left breast cancer 05/2023   IBS (irritable bowel syndrome)    Inguinal hernia    Pneumothorax 1987   PONV (postoperative nausea and vomiting)    Past Surgical History:  Procedure Laterality Date   ABDOMINAL HYSTERECTOMY  1992   ANTERIOR CERVICAL DECOMP/DISCECTOMY FUSION N/A 09/23/2022   Procedure: Anterior Decompression Fusion,PLATE/SCREWS Cervical five-six; REMOVAL OLD PLATE;  Surgeon: Tressie Stalker, MD;  Location: [facility] OR;  Service: Neurosurgery;  Laterality: N/A;   APPENDECTOMY  1992   BACK SURGERY     cervical   BREAST BIOPSY Left 06/23/2023   [place] LT BREAST BX W LOC DEV 1ST LESION IMG BX SPEC US GUIDE 06/23/2023 [facility]   BREAST BIOPSY Left 07/28/2023   [place] LT RADIOACTIVE SEED LOC 07/28/2023 [facility]   BREAST LUMPECTOMY WITH RADIOACTIVE SEED AND SENTINEL LYMPH NODE BIOPSY Left 07/29/2023   Procedure: LEFT BREAST SEED LUMPECTOMY WITH LEFT SENTINEL LYMPH NODE MAPPING;  Surgeon: Harriette Bouillon, MD;  Location: [facility] OR;  Service: General;  Laterality:  Left;  PEC BLOCK   CATARACT EXTRACTION Bilateral    2021, 2022   COLONOSCOPY  2023   PLEURAL SCARIFICATION     ruptured disk     torn tendon     right arm   TUBAL LIGATION     Patient Active Problem List   Diagnosis Date Noted   Genetic testing 07/11/2023   Malignant neoplasm of upper-outer quadrant of left breast in female, estrogen receptor negative (HCC) 06/30/2023   Cervical spondylosis with radiculopathy 09/23/2022   Elevated low-density lipoprotein level 06/14/2022   Coronary arteriosclerosis 06/14/2022   Adverse reaction to antihyperlipidemic drug, initial encounter 06/14/2022   Diarrhea 01/27/2019   Change in bowel habits 01/05/2019   Left inguinal hernia 06/27/2014   Bloating 05/17/2013   Constipation 05/17/2013   Pneumothorax 10/17/2008   HIATAL HERNIA 10/17/2008   DIVERTICULOSIS, COLON 10/17/2008   ENDOMETRIOSIS 10/17/2008   Right lower quadrant abdominal pain 10/17/2008   DEPRESSION, HX OF 10/17/2008   History of colonic polyps 10/17/2008   PNEUMOTHORAX 10/17/2008    PCP: Merri Brunette, MD   REFERRING PROVIDER: Rachel Moulds, MD  REFERRING DIAG: C50.412,Z17.1 (ICD-10-CM) - Malignant neoplasm of upper-outer quadrant of left breast in female, estrogen receptor negative (HCC) , L axillary cording  THERAPY DIAG:  Lymphedema, not elsewhere classified  Stiffness of left shoulder, not elsewhere classified  Aftercare following surgery for neoplasm  Malignant neoplasm of upper-outer quadrant of left breast in female, estrogen receptor negative (HCC)  ONSET DATE: 09/01/23  Rationale for Evaluation and Treatment: Rehabilitation  SUBJECTIVE:                                                                                                                                                                                           SUBJECTIVE STATEMENT: The redness is getting a little better.   PERTINENT HISTORY: Patient was diagnosed on 08/15/2023 with left grade 3  invasive ductal carcinoma breast cancer. She underwent a left lumpectomy and sentinel node biopsy (13 negative nodes) on 07/29/2023. It is triple negative with a Ki67 of 95%. She has a history of a cervical fusion in 08/2022. Plans to start radiation soon.   PAIN:  PAIN:  Are you having pain? Yes NPRS scale: 2/10 Pain location: L axilla Pain orientation: Left  PAIN TYPE: burning Pain description: constant  Aggravating factors: touching it Relieving factors: none reported   PRECAUTIONS: Other: L UE lymphedema risk, cervical fusion C6, C7  RED FLAGS: Compression fracture: Yes: T12    WEIGHT BEARING RESTRICTIONS: No  FALLS:  Has patient fallen in last 6 months? No  LIVING ENVIRONMENT: Lives with: lives with their spouse Lives in: House/apartment Stairs: Yes; External: 5 steps; can reach both Has following equipment at home: None  OCCUPATION: locksmith  LEISURE: was walking until last week but recently stopped due to stomach issues  HAND DOMINANCE: right   PRIOR LEVEL OF FUNCTION: Independent  PATIENT GOALS: to get my arm back to normal   OBJECTIVE:  COGNITION: Overall cognitive status: Within functional limits for tasks assessed   PALPATION: Cording palpable in L axilla, increased edema noticeable in LUE and L breast  OBSERVATIONS / OTHER ASSESSMENTS: L breast more full than R especially at lateral breast  POSTURE: forward head and rounded shoulders  UPPER EXTREMITY AROM/PROM:  A/PROM RIGHT   eval   Shoulder extension 70  Shoulder flexion 167  Shoulder abduction 166  Shoulder internal rotation 65  Shoulder external rotation 78    (Blank rows = not tested)  A/PROM LEFT   eval  Shoulder extension 65  Shoulder flexion 155  Shoulder abduction 160  Shoulder internal rotation 55  Shoulder external rotation 83    (Blank rows = not tested)  LYMPHEDEMA ASSESSMENTS:  SURGERY TYPE/DATE: 07/29/23 L lumpectomy and SLNB NUMBER OF LYMPH NODES REMOVED:  13 CHEMOTHERAPY: pt declined chemo RADIATION: plans to begin radiation soon HORMONE TREATMENT: none INFECTIONS: none  LYMPHEDEMA  ASSESSMENTS:   LANDMARK RIGHT  eval  At axilla  35.5  15 cm proximal to olecranon process 30.8  10 cm proximal to olecranon process 30  Olecranon process 26.5  15 cm proximal to ulnar styloid process 23.9  10 cm proximal to ulnar styloid process 20.4  Just proximal to ulnar styloid process 17.4  Across hand at thumb web space 19.5  At base of 2nd digit 6.5  (Blank rows = not tested)  LANDMARK LEFT  eval  At axilla  35.9  15 cm proximal to olecranon process 32.5  10 cm proximal to olecranon process 31.5  Olecranon process 28  15 cm proximal to ulnar styloid process 24.5  10 cm proximal to ulnar styloid process 20.6  Just proximal to ulnar styloid process 18.1  Across hand at thumb web space 20.6  At base of 2nd digit 6.4  (Blank rows = not tested)  TODAY'S TREATMENT:                                                                                                                                          DATE: 10/29/23: STM and gentle MFR to L axilla in area of increased discomfort and tightness, possible deep cording in this area that therapist is unable to palpate Manual lymph drainage in supine as follows: short neck, 5 diaphragmatic breaths; right axillary nodes, anterior inter-axillary anastomoses, left inguinal nodes, left axillo-inguinal anastomoses, then left breast, left upper extremity working from proximal to distal down to fingers and dorsal hand and back to lateral shoulder redirecting along pathways.    10/27/23 Manual lymph drainage in supine as follows: short neck, 5 diaphragmatic breaths; right axillary nodes, anterior inter-axillary anastomoses, left upper extremity upper arm only working from proximal to distal  STM with arm propped overhead with to axilla where pt feels cording avoiding radiation site - gentle pressure and to  serratus where pt has several trigger points that improved by end of session PROM to tolerance  10/23/23 Pulleys into flexion and abduction x to work on stretching and ROM and cording,  vcs to slow down and hold at the top longer.  Wall ball flexion x 4 bil with vcs to not look up due to neck pain Row x 5 with reports of increased neck pain.  Manual lymph drainage in supine as follows: short neck, 5 diaphragmatic breaths; right axillary nodes, anterior inter-axillary anastomoses, left upper extremity upper arm only working from proximal to distal  STM with arm propped overhead with cocoa butter to axilla where pt feels cording avoiding radiation site - gentle pressure  PROM to tolerance  10/21/23 Redid SOZO which is back in the green at 2.0 - emphasized to pt that she is in the highest risk right now and she should continue wearing the sleeve.   Breast is red with a patch of more fragile appearing skin in the  axilla so focused on MLD of the UE today.   Manual lymph drainage in supine as follows: short neck, 5 diaphragmatic breaths; right axillary nodes, anterior inter-axillary anastomoses, left inguinal nodes, left axillo-inguinal anastomosis, left upper extremity working from proximal to distal down to fingers and dorsal hand and back to lateral shoulder redirecting along pathways.  PROM into flexion x 3   10/08/23 Manual lymph drainage in supine as follows: short neck, 5 diaphragmatic breaths; right axillary nodes, anterior inter-axillary anastomoses, left inguinal nodes, left axillo-inguinal anastomoses, then left breast, left upper extremity working from proximal to distal down to fingers and dorsal hand and back to lateral shoulder redirecting along pathways.  MFR gently to L drain site where there is a small cord palpable  10/06/23 Discussed on going fullness at L superclavicular region. Had Dwaine Gale, PT also look and assess since this is not a typical sign with UE lymphedema. She  did have a scan in Sept of her neck and no evidence of lymphadenopathy was found at that time. Discussed with pt since she had 13 nodes removed it could be lymphedema since that area is an easy area for fluid to expand in to.  Continued Manual lymph drainage in supine as follows: short neck, right axillary nodes, left inguinal nodes, superficial and deep abdominals; anterior inter-axillary anastamoses, left axillo-inguinal anastamoses. Left upper extremity from fingers and dorsal hand to lateral shoulder redirecting along pathways. Spent extra time at L supraclavicular region and pt reported increased comfort by end of session. Encouraged her to try her sleeve when she gets home.   10/01/23 See assessment   09/29/23: Manual therapy Continued to instruct pt in proper technqiue and answered her questions regarding breast MLD having her return demo each step and cuing offered throughout to adjust for lighter pressure and direction of skin stretch: Manual lymph drainage in supine as follows: short neck, 5 diaphragmatic breaths; right axillary nodes, anterior inter-axillary anastomoses, left inguinal nodes, left axillo-inguinal anastomoses, then left breast, left upper extremity working from proximal to distal down to fingers and dorsal hand and back to lateral shoulder redirecting along pathways.  MFR gently to L axilla where there is a thick cord and to antecubital fossa over area of cording, this less palpable today  09/24/23: Manual therapy Instructed pt in following today having her return demo of each step and cuing offered throughout to adjust for lighter pressure: Manual lymph drainage in supine as follows: short neck, 5 diaphragmatic breaths; right axillary nodes, anterior inter-axillary anastomoses, left inguinal nodes, left axillo-inguinal anastomoses, then left breast, left upper extremity working from proximal to distal down to fingers and dorsal hand and back to lateral shoulder redirecting along  pathways.  P/ROM to Lt shoulder into flex, abd and D2 gently to pts tolerance and with scapular depression throughout by therapist; multiple VC's throughout to relax due to guarding but less so today MFR gently to medial Lt upper arm and antecubital fossa over area of cording, this less palpable today  09/22/23: Manual therapy Manual lymph drainage in supine as follows: short neck, superficial and deep abdominals; right axillary nodes, right anterior intact thorax, anterior inter-axillary anastomoses, left inguinal nodes, left axillo-inguinal anastomoses, then left breast, left upper extremity from fingers and dorsal hand to lateral shoulder redirecting along pathways.  P/ROM to Lt shoulder into flex, abd and D2 gently to pts tolerance and with scapular depression throughout by therapist; multiple VC's throughout to relax due to guarding but less so today MFR gently to  medial Lt upper arm and antecubital fossa over area of cording, this less palpable today   09/18/23: Manual therapy Manual lymph drainage in supine as follows: short neck, superficial and deep abdominals; right axillary nodes, right anterior intact thorax, anterior inter-axillary anastomoses, left inguinal nodes, left axillo-inguinal anastomoses, then left breast, left upper extremity from fingers and dorsal hand to lateral shoulder redirecting along pathways.  P/ROM to Lt shoulder into flex, abd and D2 gently to pts tolerance and with scapular depression throughout by therapist; multiple VC's throughout to relax due to guarding.  MFR gently to medial Lt upper arm and antecubital fossa over area of cording; one small pop felt by pt and therapist at end of session over antecubital fossa STM in Rt S/L to Lt lateral trunk with cocoa butter Scap Mobs in Rt S/L to Lt scapula into protraction and retraction Cut and issued TG soft size medium for pt to wear in lieu of her compression sleeve until her new one arrives.   09/16/23: Measured pt  for a new compression sleeve since the one given last session is too short and she is having swelling in her hand. She fit in to an off the shelf Sigvaris Secure size M2 (long) and medium glove - issued info for pt to obtain this online Pulleys x 2 min in direction of flexion and 2 min in direction of abduction MFR throughout LUE to help decrease cording with numerous cords palpable and a large thick cord palpable in L axilla Began Manual lymph drainage in supine as follows: short neck, right axillary nodes, left inguinal nodes, superficial and deep abdominals; anterior inter-axillary anastamoses, left axillo-inguinal anastamoses. Left upper extremity from fingers and dorsal hand to lateral shoulder redirecting along pathways. Instructed pt throughout in sequence, skin stretch and anatomy and physiology of the lymphatic system.    09/09/23: Pulleys x 2 min in direction of flexion and 2 min in direction of abduction with pt returning therapist demo and feeling less discomfort at end of pulleys Measured pt for compression sleeve after increased Ldex score: Medi Harmony Size 3 EW and issued this to her    PATIENT EDUCATION:  Education details: Self MLD Person educated: Patient Education method: Medical illustrator, handout given Education comprehension: verbalized understanding and returned demonstration, will benefit from further review  HOME EXERCISE PROGRAM: Continue post op exercises Over the door pulleys Wear compression sleeve 12 hours a day for 4 weeks 09/24/23: Self MLD  ASSESSMENT:  CLINICAL IMPRESSION: Continued to work on decreasing discomfort in L axilla where there is most likely a deep cord that therapist was unable to palpate today. Was able to begin MLD on L breast again as that area is beginning to look less red. Therapist worked gently in this area and pt did not have any discomfort from MLD. She will miss her appointments next week because her daughter is having  surgery. Will recheck pt when she returns and discuss d/c at that time.   OBJECTIVE IMPAIRMENTS: decreased knowledge of condition, decreased knowledge of use of DME, decreased ROM, increased edema, increased fascial restrictions, impaired UE functional use, postural dysfunction, and pain.   ACTIVITY LIMITATIONS: carrying, lifting, and reach over head  PARTICIPATION LIMITATIONS: meal prep, cleaning, laundry, and community activity  PERSONAL FACTORS:  none  are also affecting patient's functional outcome.   REHAB POTENTIAL: Good  CLINICAL DECISION MAKING: Stable/uncomplicated  EVALUATION COMPLEXITY: Low  GOALS: Goals reviewed with patient? Yes  SHORT TERM GOALS=LONG TERM GOALS Target date: 10/07/23  Pt will be independent with self MLD for L UE and L breast for long term management of lymphedema. Baseline: Goal status: ONGOING 10/08/23  2.  Pt will return to the green on SOZO demonstrating reversal of subclinical lymphedema. Baseline:  Goal status: ONGOING 10/08/23  3.  Pt will demonstrate 165 degrees of L shoulder flexion to allow her to reach overhead.  Baseline:  Goal status: ONGOING 10/08/23  4.  Pt will be independent in a home exercise program for continued stretching and strengthening.  Baseline:  Goal status: ONGOING 10/08/23  5.  Pt will report a 75% improvement in pain and discomfort from axillary cording to allow improved comfort.  Baseline:  Goal status: ONGOING 10/08/23- still presents with cording and discomfort in axilla  6.  Pt will obtain appropriate compression garments - sleeve and bra for long term management.  Baseline:  Goal status: MET 10/08/23 has sleeve and compression bra  PLAN:  PT FREQUENCY: 2x/week  PT DURATION: 4 weeks  PLANNED INTERVENTIONS: Therapeutic exercises, Therapeutic activity, Patient/Family education, Self Care, Joint mobilization, Orthotic/Fit training, Manual lymph drainage, Compression bandaging, scar mobilization, Taping,  Vasopneumatic device, Manual therapy, and Re-evaluation  PLAN FOR NEXT SESSION: ready for d/c? Reassess how swelling is after having week off from therapy, ROM to L shoulder and MFR to L axilla for cording as able now healing from radiation, schedule SOZO out for around 01/21/24, cont and review MLD to L breast and LUE, MFR to cording in L axilla  Floyde Dingley Breedlove Blue, PT 10/29/2023, 12:03 PM   Hug yourself.  Do circles at your neck just above your collarbones.  Repeat this 10 times.  Diaphragmatic - Supine   Inhale through nose making navel move out toward hands. Exhale through puckered lips, hands follow navel in. Repeat _5__ times. Rest _10__ seconds between repeats.    Axilla to Axilla - Sweep   On uninvolved side make 5 circles in the armpit, then pump _5__ times from involved armpit across chest to uninvolved armpit, making a pathway. Do _1__ time per day.  Copyright  VHI. All rights reserved.  Axilla to Inguinal Nodes - Sweep   On involved side, make 5 circles at groin at panty line, then pump _5__ times from armpit along side of trunk to outer hip, making your other pathway. Do __1_ time per day.  BREAST SEQUENCE: Draw an imaginary diagonal line from upper outer breast through the nipple area toward lower inner breast.  Direct fluid upward and inward from this line toward the pathway across your upper chest .  Do this in three rows to treat all of the upper inner breast tissue, and do each row 3-4x.      Direct fluid to treat all of lower outer breast tissue downward and outward toward pathway that is aimed at the left groin.   Arm Posterior: Elbow to Shoulder - Sweep   Pump _5__ times from back of elbow to top of shoulder. Then inner to outer upper arm _5_ times, then outer arm again _5_ times. Then back to the pathways _2-3_ times. Do _1__ time per day.  Copyright  VHI. All rights reserved.  ARM: Volar Wrist to Elbow - Sweep   Pump or stationary circles _5__  times from wrist to elbow making sure to do both sides of the forearm. Then retrace your steps to the outer arm, and the pathways _2-3_ times each. Do _1__ time per day.  Copyright  VHI. All rights reserved.  ARM: Dorsum  of Hand to Shoulder - Sweep   Pump or stationary circles _5__ times on back of hand including knuckle spaces and individual fingers if needed working up towards the wrist, then retrace all your steps working back up the forearm, doing both sides; upper outer arm and back to your pathways _2-3_ times each. Then do 5 circles again at uninvolved armpit and involved groin where you started! Good job!! Do __1_ time per day.  Cancer Rehab [phone]

## 2023-11-03 ENCOUNTER — Encounter: Payer: Medicare Other | Admitting: Physical Therapy

## 2023-11-05 ENCOUNTER — Encounter: Payer: Medicare Other | Admitting: Physical Therapy

## 2023-11-10 ENCOUNTER — Ambulatory Visit: Payer: Medicare Other | Attending: Surgery

## 2023-11-10 DIAGNOSIS — M25531 Pain in right wrist: Secondary | ICD-10-CM | POA: Diagnosis not present

## 2023-11-10 DIAGNOSIS — Z483 Aftercare following surgery for neoplasm: Secondary | ICD-10-CM | POA: Diagnosis not present

## 2023-11-10 DIAGNOSIS — M25612 Stiffness of left shoulder, not elsewhere classified: Secondary | ICD-10-CM | POA: Diagnosis not present

## 2023-11-10 DIAGNOSIS — I890 Lymphedema, not elsewhere classified: Secondary | ICD-10-CM | POA: Diagnosis not present

## 2023-11-10 NOTE — Therapy (Signed)
OUTPATIENT PHYSICAL THERAPY  UPPER EXTREMITY ONCOLOGY TREATMENT  Patient Name: [name] [name] MRN: [id number] DOB:[birth year], 72 y.o., female Today's Date: 11/10/2023  END OF SESSION:  PT End of Session - 11/10/23 1109     Visit Number 13    Number of Visits 24    Date for PT Re-Evaluation 12/08/23    PT Start Time 1106    PT Stop Time 1202    PT Time Calculation (min) 56 min    Activity Tolerance Patient tolerated treatment well    Behavior During Therapy WFL for tasks assessed/performed                 Past Medical History:  Diagnosis Date   Adenomatous colon polyp    Allergy    Anxiety    Arthritis    bilateral hands. lower back   Cancer [facility])    skin   Cataract    bilateral   COPD (chronic obstructive pulmonary disease) (HCC)    Depression    Diverticulosis    Endometriosis    GERD (gastroesophageal reflux disease)    Glaucoma    Headache    Migraines   Hiatal hernia    History of left breast cancer 05/2023   IBS (irritable bowel syndrome)    Inguinal hernia    Pneumothorax 1987   PONV (postoperative nausea and vomiting)    Past Surgical History:  Procedure Laterality Date   ABDOMINAL HYSTERECTOMY  1992   ANTERIOR CERVICAL DECOMP/DISCECTOMY FUSION N/A 09/23/2022   Procedure: Anterior Decompression Fusion,PLATE/SCREWS Cervical five-six; REMOVAL OLD PLATE;  Surgeon: Tressie Stalker, MD;  Location: [facility] OR;  Service: Neurosurgery;  Laterality: N/A;   APPENDECTOMY  1992   BACK SURGERY     cervical   BREAST BIOPSY Left 06/23/2023   [place] LT BREAST BX W LOC DEV 1ST LESION IMG BX SPEC US GUIDE 06/23/2023 [facility]GR[facility]   BREAST BIOPSY Left 07/28/2023   [place] LT RADIOACTIVE SEED LOC 07/28/2023 [facility]   BREAST LUMPECTOMY WITH RADIOACTIVE SEED AND SENTINEL LYMPH NODE BIOPSY Left 07/29/2023   Procedure: LEFT BREAST SEED LUMPECTOMY WITH LEFT SENTINEL LYMPH NODE MAPPING;  Surgeon: Harriette Bouillon, MD;  Location: [facility] OR;  Service: General;  Laterality:  Left;  PEC BLOCK   CATARACT EXTRACTION Bilateral    2021, 2022   COLONOSCOPY  2023   PLEURAL SCARIFICATION     ruptured disk     torn tendon     right arm   TUBAL LIGATION     Patient Active Problem List   Diagnosis Date Noted   Genetic testing 07/11/2023   Malignant neoplasm of upper-outer quadrant of left breast in female, estrogen receptor negative (HCC) 06/30/2023   Cervical spondylosis with radiculopathy 09/23/2022   Elevated low-density lipoprotein level 06/14/2022   Coronary arteriosclerosis 06/14/2022   Adverse reaction to antihyperlipidemic drug, initial encounter 06/14/2022   Diarrhea 01/27/2019   Change in bowel habits 01/05/2019   Left inguinal hernia 06/27/2014   Bloating 05/17/2013   Constipation 05/17/2013   Pneumothorax 10/17/2008   HIATAL HERNIA 10/17/2008   DIVERTICULOSIS, COLON 10/17/2008   ENDOMETRIOSIS 10/17/2008   Right lower quadrant abdominal pain 10/17/2008   DEPRESSION, HX OF 10/17/2008   History of colonic polyps 10/17/2008   PNEUMOTHORAX 10/17/2008    PCP: Merri Brunette, MD   REFERRING PROVIDER: Rachel Moulds, MD  REFERRING DIAG: C50.412,Z17.1 (ICD-10-CM) - Malignant neoplasm of upper-outer quadrant of left breast in female, estrogen receptor negative (HCC) , L axillary cording  THERAPY DIAG:  Lymphedema, not elsewhere classified  Stiffness of left shoulder, not elsewhere classified  Aftercare following surgery for neoplasm  ONSET DATE: 09/01/23  Rationale for Evaluation and Treatment: Rehabilitation  SUBJECTIVE:                                                                                                                                                                                           SUBJECTIVE STATEMENT: The cording in my arm is better but it does come and go in my armpit. My skin is starting to look good since completing radiation 3 weeks ago.   PERTINENT HISTORY: Patient was diagnosed on 08/15/2023 with left grade 3  invasive ductal carcinoma breast cancer. She underwent a left lumpectomy and sentinel node biopsy (13 negative nodes) on 07/29/2023. It is triple negative with a Ki67 of 95%. She has a history of a cervical fusion in 08/2022. Plans to start radiation soon.   PAIN:  PAIN:  Are you having pain? No, just tender to touch  PRECAUTIONS: Other: L UE lymphedema risk, cervical fusion C6, C7  RED FLAGS: Compression fracture: Yes: T12    WEIGHT BEARING RESTRICTIONS: No  FALLS:  Has patient fallen in last 6 months? No  LIVING ENVIRONMENT: Lives with: lives with their spouse Lives in: House/apartment Stairs: Yes; External: 5 steps; can reach both Has following equipment at home: None  OCCUPATION: locksmith  LEISURE: was walking until last week but recently stopped due to stomach issues  HAND DOMINANCE: right   PRIOR LEVEL OF FUNCTION: Independent  PATIENT GOALS: to get my arm back to normal   OBJECTIVE:  COGNITION: Overall cognitive status: Within functional limits for tasks assessed   PALPATION: Cording palpable in L axilla, increased edema noticeable in LUE and L breast  OBSERVATIONS / OTHER ASSESSMENTS: L breast more full than R especially at lateral breast  POSTURE: forward head and rounded shoulders  UPPER EXTREMITY AROM/PROM:  A/PROM RIGHT   eval   Shoulder extension 70  Shoulder flexion 167  Shoulder abduction 166  Shoulder internal rotation 65  Shoulder external rotation 78    (Blank rows = not tested)  A/PROM LEFT   eval Left 11/10/23  Shoulder extension 65   Shoulder flexion 155 158  Shoulder abduction 160   Shoulder internal rotation 55   Shoulder external rotation 83     (Blank rows = not tested)  LYMPHEDEMA ASSESSMENTS:  SURGERY TYPE/DATE: 07/29/23 L lumpectomy and SLNB NUMBER OF LYMPH NODES REMOVED: 13 CHEMOTHERAPY: pt declined chemo RADIATION: plans to begin radiation soon HORMONE TREATMENT: none INFECTIONS: none  LYMPHEDEMA ASSESSMENTS:    LANDMARK RIGHT  eval  At  axilla  35.5  15 cm proximal to olecranon process 30.8  10 cm proximal to olecranon process 30  Olecranon process 26.5  15 cm proximal to ulnar styloid process 23.9  10 cm proximal to ulnar styloid process 20.4  Just proximal to ulnar styloid process 17.4  Across hand at thumb web space 19.5  At base of 2nd digit 6.5  (Blank rows = not tested)  LANDMARK LEFT  eval  At axilla  35.9  15 cm proximal to olecranon process 32.5  10 cm proximal to olecranon process 31.5  Olecranon process 28  15 cm proximal to ulnar styloid process 24.5  10 cm proximal to ulnar styloid process 20.6  Just proximal to ulnar styloid process 18.1  Across hand at thumb web space 20.6  At base of 2nd digit 6.4  (Blank rows = not tested)  TODAY'S TREATMENT:                                                                                                                                          DATE: 11/10/23: Manual Therapy STM and gentle MFR to L axilla in area of increased discomfort and tightness, possible deep cording in this area that therapist is unable to palpate Manual lymph drainage in supine as follows: short neck, 5 diaphragmatic breaths; right axillary nodes, anterior inter-axillary anastomoses, left inguinal nodes, left axillo-inguinal anastomoses, then left breast, left upper extremity working from proximal to distal down to fingers and dorsal hand and back to lateral shoulder redirecting along pathways.  Goals assessed for renewal and A/ROM for Lt shoulder measured.  10/29/23: STM and gentle MFR to L axilla in area of increased discomfort and tightness, possible deep cording in this area that therapist is unable to palpate Manual lymph drainage in supine as follows: short neck, 5 diaphragmatic breaths; right axillary nodes, anterior inter-axillary anastomoses, left inguinal nodes, left axillo-inguinal anastomoses, then left breast, left upper extremity working from  proximal to distal down to fingers and dorsal hand and back to lateral shoulder redirecting along pathways.    10/27/23 Manual lymph drainage in supine as follows: short neck, 5 diaphragmatic breaths; right axillary nodes, anterior inter-axillary anastomoses, left upper extremity upper arm only working from proximal to distal  STM with arm propped overhead with to axilla where pt feels cording avoiding radiation site - gentle pressure and to serratus where pt has several trigger points that improved by end of session PROM to tolerance  10/23/23 Pulleys into flexion and abduction x to work on stretching and ROM and cording,  vcs to slow down and hold at the top longer.  Wall ball flexion x 4 bil with vcs to not look up due to neck pain Row x 5 with reports of increased neck pain.  Manual lymph drainage in supine as follows: short neck, 5 diaphragmatic breaths; right axillary nodes, anterior inter-axillary anastomoses, left upper  extremity upper arm only working from proximal to distal  STM with arm propped overhead with cocoa butter to axilla where pt feels cording avoiding radiation site - gentle pressure  PROM to tolerance  10/21/23 Redid SOZO which is back in the green at 2.0 - emphasized to pt that she is in the highest risk right now and she should continue wearing the sleeve.   Breast is red with a patch of more fragile appearing skin in the axilla so focused on MLD of the UE today.   Manual lymph drainage in supine as follows: short neck, 5 diaphragmatic breaths; right axillary nodes, anterior inter-axillary anastomoses, left inguinal nodes, left axillo-inguinal anastomosis, left upper extremity working from proximal to distal down to fingers and dorsal hand and back to lateral shoulder redirecting along pathways.  PROM into flexion x 3   10/08/23 Manual lymph drainage in supine as follows: short neck, 5 diaphragmatic breaths; right axillary nodes, anterior inter-axillary  anastomoses, left inguinal nodes, left axillo-inguinal anastomoses, then left breast, left upper extremity working from proximal to distal down to fingers and dorsal hand and back to lateral shoulder redirecting along pathways.  MFR gently to L drain site where there is a small cord palpable  10/06/23 Discussed on going fullness at L superclavicular region. Had Dwaine Gale, PT also look and assess since this is not a typical sign with UE lymphedema. She did have a scan in Sept of her neck and no evidence of lymphadenopathy was found at that time. Discussed with pt since she had 13 nodes removed it could be lymphedema since that area is an easy area for fluid to expand in to.  Continued Manual lymph drainage in supine as follows: short neck, right axillary nodes, left inguinal nodes, superficial and deep abdominals; anterior inter-axillary anastamoses, left axillo-inguinal anastamoses. Left upper extremity from fingers and dorsal hand to lateral shoulder redirecting along pathways. Spent extra time at L supraclavicular region and pt reported increased comfort by end of session. Encouraged her to try her sleeve when she gets home.   10/01/23 See assessment   09/29/23: Manual therapy Continued to instruct pt in proper technqiue and answered her questions regarding breast MLD having her return demo each step and cuing offered throughout to adjust for lighter pressure and direction of skin stretch: Manual lymph drainage in supine as follows: short neck, 5 diaphragmatic breaths; right axillary nodes, anterior inter-axillary anastomoses, left inguinal nodes, left axillo-inguinal anastomoses, then left breast, left upper extremity working from proximal to distal down to fingers and dorsal hand and back to lateral shoulder redirecting along pathways.  MFR gently to L axilla where there is a thick cord and to antecubital fossa over area of cording, this less palpable today  09/24/23: Manual therapy Instructed pt  in following today having her return demo of each step and cuing offered throughout to adjust for lighter pressure: Manual lymph drainage in supine as follows: short neck, 5 diaphragmatic breaths; right axillary nodes, anterior inter-axillary anastomoses, left inguinal nodes, left axillo-inguinal anastomoses, then left breast, left upper extremity working from proximal to distal down to fingers and dorsal hand and back to lateral shoulder redirecting along pathways.  P/ROM to Lt shoulder into flex, abd and D2 gently to pts tolerance and with scapular depression throughout by therapist; multiple VC's throughout to relax due to guarding but less so today MFR gently to medial Lt upper arm and antecubital fossa over area of cording, this less palpable today  09/22/23: Manual therapy Manual  lymph drainage in supine as follows: short neck, superficial and deep abdominals; right axillary nodes, right anterior intact thorax, anterior inter-axillary anastomoses, left inguinal nodes, left axillo-inguinal anastomoses, then left breast, left upper extremity from fingers and dorsal hand to lateral shoulder redirecting along pathways.  P/ROM to Lt shoulder into flex, abd and D2 gently to pts tolerance and with scapular depression throughout by therapist; multiple VC's throughout to relax due to guarding but less so today MFR gently to medial Lt upper arm and antecubital fossa over area of cording, this less palpable today   09/18/23: Manual therapy Manual lymph drainage in supine as follows: short neck, superficial and deep abdominals; right axillary nodes, right anterior intact thorax, anterior inter-axillary anastomoses, left inguinal nodes, left axillo-inguinal anastomoses, then left breast, left upper extremity from fingers and dorsal hand to lateral shoulder redirecting along pathways.  P/ROM to Lt shoulder into flex, abd and D2 gently to pts tolerance and with scapular depression throughout by therapist;  multiple VC's throughout to relax due to guarding.  MFR gently to medial Lt upper arm and antecubital fossa over area of cording; one small pop felt by pt and therapist at end of session over antecubital fossa STM in Rt S/L to Lt lateral trunk with cocoa butter Scap Mobs in Rt S/L to Lt scapula into protraction and retraction Cut and issued TG soft size medium for pt to wear in lieu of her compression sleeve until her new one arrives.   09/16/23: Measured pt for a new compression sleeve since the one given last session is too short and she is having swelling in her hand. She fit in to an off the shelf Sigvaris Secure size M2 (long) and medium glove - issued info for pt to obtain this online Pulleys x 2 min in direction of flexion and 2 min in direction of abduction MFR throughout LUE to help decrease cording with numerous cords palpable and a large thick cord palpable in L axilla Began Manual lymph drainage in supine as follows: short neck, right axillary nodes, left inguinal nodes, superficial and deep abdominals; anterior inter-axillary anastamoses, left axillo-inguinal anastamoses. Left upper extremity from fingers and dorsal hand to lateral shoulder redirecting along pathways. Instructed pt throughout in sequence, skin stretch and anatomy and physiology of the lymphatic system.    09/09/23: Pulleys x 2 min in direction of flexion and 2 min in direction of abduction with pt returning therapist demo and feeling less discomfort at end of pulleys Measured pt for compression sleeve after increased Ldex score: Medi Harmony Size 3 EW and issued this to her    PATIENT EDUCATION:  Education details: Self MLD Person educated: Patient Education method: Medical illustrator, handout given Education comprehension: verbalized understanding and returned demonstration, will benefit from further review  HOME EXERCISE PROGRAM: Continue post op exercises Over the door pulleys Wear compression  sleeve 12 hours a day for 4 weeks 09/24/23: Self MLD  ASSESSMENT:  CLINICAL IMPRESSION: Pt reports today since completing radiation that she would like to be seen for a few more sessions thru the month as she continues to recover from radiation. Discussed with pt that she should cont with HEP and we agreed to work towards 1x/wk and then D/C. Pt was agreeable to this. Today continued with manual therapy including STM to axilla where tightness palpable and pt tender, but cording was not palpable. Lt shoulder end P/ROM is limited, but mildly improved A/ROM by 3 degrees. She will benefit from continued physical therapy  at this time to help progress her end ROM of Lt shoulder and decrease fascial tightness and discomfort present since completing radiation, which are her remaining unmet goals.   OBJECTIVE IMPAIRMENTS: decreased knowledge of condition, decreased knowledge of use of DME, decreased ROM, increased edema, increased fascial restrictions, impaired UE functional use, postural dysfunction, and pain.   ACTIVITY LIMITATIONS: carrying, lifting, and reach over head  PARTICIPATION LIMITATIONS: meal prep, cleaning, laundry, and community activity  PERSONAL FACTORS:  none  are also affecting patient's functional outcome.   REHAB POTENTIAL: Good  CLINICAL DECISION MAKING: Stable/uncomplicated  EVALUATION COMPLEXITY: Low  GOALS: Goals reviewed with patient? Yes  SHORT TERM GOALS=LONG TERM GOALS Target date: 10/07/23  Pt will be independent with self MLD for L UE and L breast for long term management of lymphedema. Baseline: Goal status: ONGOING 10/08/23; MET 11/10/23  2.  Pt will return to the green on SOZO demonstrating reversal of subclinical lymphedema. Baseline:  Goal status: ONGOING 10/08/23; MET as of 10/21/23  3.  Pt will demonstrate 165 degrees of L shoulder flexion to allow her to reach overhead.  Baseline:  Goal status: ONGOING 10/08/23; ONGOING - 158 degrees (after completing  radiation)- 11/10/23  4.  Pt will be independent in a home exercise program for continued stretching and strengthening.  Baseline:  Goal status: ONGOING 10/08/23 and 11/10/23  5.  Pt will report a 75% improvement in pain and discomfort from axillary cording to allow improved comfort.  Baseline:  Goal status: ONGOING 10/08/23- still presents with cording and discomfort in axilla; ONGOING 11/10/23 - 60% improvement reported with mostly being tender to touch  6.  Pt will obtain appropriate compression garments - sleeve and bra for long term management.  Baseline:  Goal status: MET 10/08/23 has sleeve and compression bra  PLAN:  PT FREQUENCY: 2x/week  PT DURATION: 4 weeks  PLANNED INTERVENTIONS: Therapeutic exercises, Therapeutic activity, Patient/Family education, Self Care, Joint mobilization, Orthotic/Fit training, Manual lymph drainage, Compression bandaging, scar mobilization, Taping, Vasopneumatic device, Manual therapy, and Re-evaluation  PLAN FOR NEXT SESSION: Renewal done this session, pt to work towards decreasing freq to 1-2x/wk.  Reassess how swelling is after having week off from therapy, ROM to L shoulder and MFR to L axilla for cording as able now healing from radiation, schedule SOZO out for around 01/21/24, cont and review MLD to L breast and LUE, MFR to cording in L axilla  Hermenia Bers, PTA 11/10/2023, 1:30 PM   Hug yourself.  Do circles at your neck just above your collarbones.  Repeat this 10 times.  Diaphragmatic - Supine   Inhale through nose making navel move out toward hands. Exhale through puckered lips, hands follow navel in. Repeat _5__ times. Rest _10__ seconds between repeats.    Axilla to Axilla - Sweep   On uninvolved side make 5 circles in the armpit, then pump _5__ times from involved armpit across chest to uninvolved armpit, making a pathway. Do _1__ time per day.  Copyright  VHI. All rights reserved.  Axilla to Inguinal Nodes -  Sweep   On involved side, make 5 circles at groin at panty line, then pump _5__ times from armpit along side of trunk to outer hip, making your other pathway. Do __1_ time per day.  BREAST SEQUENCE: Draw an imaginary diagonal line from upper outer breast through the nipple area toward lower inner breast.  Direct fluid upward and inward from this line toward the pathway across your upper chest .  Do  this in three rows to treat all of the upper inner breast tissue, and do each row 3-4x.      Direct fluid to treat all of lower outer breast tissue downward and outward toward pathway that is aimed at the left groin.   Arm Posterior: Elbow to Shoulder - Sweep   Pump _5__ times from back of elbow to top of shoulder. Then inner to outer upper arm _5_ times, then outer arm again _5_ times. Then back to the pathways _2-3_ times. Do _1__ time per day.  Copyright  VHI. All rights reserved.  ARM: Volar Wrist to Elbow - Sweep   Pump or stationary circles _5__ times from wrist to elbow making sure to do both sides of the forearm. Then retrace your steps to the outer arm, and the pathways _2-3_ times each. Do _1__ time per day.  Copyright  VHI. All rights reserved.  ARM: Dorsum of Hand to Shoulder - Sweep   Pump or stationary circles _5__ times on back of hand including knuckle spaces and individual fingers if needed working up towards the wrist, then retrace all your steps working back up the forearm, doing both sides; upper outer arm and back to your pathways _2-3_ times each. Then do 5 circles again at uninvolved armpit and involved groin where you started! Good job!! Do __1_ time per day.  Cancer Rehab [phone]

## 2023-11-11 ENCOUNTER — Inpatient Hospital Stay: Payer: Medicare Other | Attending: Hematology and Oncology | Admitting: Hematology and Oncology

## 2023-11-11 VITALS — BP 147/67 | HR 72 | Temp 97.5°F | Resp 18 | Wt 218.9 lb

## 2023-11-11 DIAGNOSIS — Z8379 Family history of other diseases of the digestive system: Secondary | ICD-10-CM | POA: Insufficient documentation

## 2023-11-11 DIAGNOSIS — C50412 Malignant neoplasm of upper-outer quadrant of left female breast: Secondary | ICD-10-CM | POA: Insufficient documentation

## 2023-11-11 DIAGNOSIS — Z8262 Family history of osteoporosis: Secondary | ICD-10-CM | POA: Insufficient documentation

## 2023-11-11 DIAGNOSIS — Z801 Family history of malignant neoplasm of trachea, bronchus and lung: Secondary | ICD-10-CM | POA: Insufficient documentation

## 2023-11-11 DIAGNOSIS — Z881 Allergy status to other antibiotic agents status: Secondary | ICD-10-CM | POA: Insufficient documentation

## 2023-11-11 DIAGNOSIS — Z888 Allergy status to other drugs, medicaments and biological substances status: Secondary | ICD-10-CM | POA: Insufficient documentation

## 2023-11-11 DIAGNOSIS — Z860101 Personal history of adenomatous and serrated colon polyps: Secondary | ICD-10-CM | POA: Diagnosis not present

## 2023-11-11 DIAGNOSIS — Z882 Allergy status to sulfonamides status: Secondary | ICD-10-CM | POA: Diagnosis not present

## 2023-11-11 DIAGNOSIS — R21 Rash and other nonspecific skin eruption: Secondary | ICD-10-CM | POA: Insufficient documentation

## 2023-11-11 DIAGNOSIS — J449 Chronic obstructive pulmonary disease, unspecified: Secondary | ICD-10-CM | POA: Diagnosis not present

## 2023-11-11 DIAGNOSIS — Z79899 Other long term (current) drug therapy: Secondary | ICD-10-CM | POA: Diagnosis not present

## 2023-11-11 DIAGNOSIS — Z87891 Personal history of nicotine dependence: Secondary | ICD-10-CM | POA: Insufficient documentation

## 2023-11-11 DIAGNOSIS — Z8249 Family history of ischemic heart disease and other diseases of the circulatory system: Secondary | ICD-10-CM | POA: Insufficient documentation

## 2023-11-11 DIAGNOSIS — R42 Dizziness and giddiness: Secondary | ICD-10-CM | POA: Diagnosis not present

## 2023-11-11 DIAGNOSIS — Z923 Personal history of irradiation: Secondary | ICD-10-CM | POA: Diagnosis not present

## 2023-11-11 DIAGNOSIS — K580 Irritable bowel syndrome with diarrhea: Secondary | ICD-10-CM | POA: Diagnosis not present

## 2023-11-11 DIAGNOSIS — Z90710 Acquired absence of both cervix and uterus: Secondary | ICD-10-CM | POA: Insufficient documentation

## 2023-11-11 DIAGNOSIS — F32A Depression, unspecified: Secondary | ICD-10-CM | POA: Insufficient documentation

## 2023-11-11 DIAGNOSIS — E7800 Pure hypercholesterolemia, unspecified: Secondary | ICD-10-CM | POA: Insufficient documentation

## 2023-11-11 DIAGNOSIS — Z171 Estrogen receptor negative status [ER-]: Secondary | ICD-10-CM | POA: Insufficient documentation

## 2023-11-11 DIAGNOSIS — Z83719 Family history of colon polyps, unspecified: Secondary | ICD-10-CM | POA: Diagnosis not present

## 2023-11-11 DIAGNOSIS — Z8349 Family history of other endocrine, nutritional and metabolic diseases: Secondary | ICD-10-CM | POA: Insufficient documentation

## 2023-11-11 DIAGNOSIS — I890 Lymphedema, not elsewhere classified: Secondary | ICD-10-CM | POA: Diagnosis not present

## 2023-11-11 DIAGNOSIS — Z9049 Acquired absence of other specified parts of digestive tract: Secondary | ICD-10-CM | POA: Insufficient documentation

## 2023-11-11 DIAGNOSIS — F419 Anxiety disorder, unspecified: Secondary | ICD-10-CM | POA: Diagnosis not present

## 2023-11-11 NOTE — Progress Notes (Signed)
[facility] CONSULT NOTE  Patient Care Team: Merri Brunette, MD as PCP - General (Internal Medicine) Rachel Moulds, MD as Consulting Physician (Hematology and Oncology) Harriette Bouillon, MD as Consulting Physician (General Surgery) Antony Blackbird, MD as Consulting Physician (Radiation Oncology) Donnelly Angelica, RN as Oncology Nurse Navigator Pershing Proud, RN as Oncology Nurse Navigator  CHIEF COMPLAINTS/PURPOSE OF CONSULTATION:  Newly diagnosed breast cancer  HISTORY OF PRESENTING ILLNESS:  [name] [name] 72 y.o. female is here because of recent diagnosis of left breast cancer  I reviewed her records extensively and collaborated the history with the patient.  SUMMARY OF ONCOLOGIC HISTORY: Oncology History  Malignant neoplasm of upper-outer quadrant of left breast in female, estrogen receptor negative (HCC)  06/03/2023 Mammogram   Screening mammogram bilaterally showed possible mass in the left breast warranting further evaluation.  Diagnostic mammogram confirmed highly suspicious 2.2 cm upper outer left breast mass, no abnormal appearing left axillary lymph nodes   06/23/2023 Pathology Results   Pathology results from the left breast needle core biopsy showed grade 3 invasive poorly differentiated ductal carcinoma, focal high-grade DCIS, cribriform type, negative for angiolymphatic invasion, negative for micro calcs, prognostic showed ER 0% negative, PR 0% negative, HER2 negative and Ki-67 of 95%.   06/30/2023 Initial Diagnosis   Malignant neoplasm of upper-outer quadrant of left breast in female, estrogen receptor negative (HCC)    Genetic Testing   [facility] Custom Panel+RNA was Negative. Of note, a variant of uncertain significance was detected in the MSH3 gene (c.1027+4T>C (Intronic)). Report date is 07/10/2023.  The Custom Hereditary Cancers Panel offered by [facility] includes sequencing and/or deletion duplication testing of the following 46 genes: APC, ATM, AXIN2,  BAP1, BARD1, BMPR1A, BRCA1, BRCA2, BRIP1, CDH1, CDK4, CDKN2A (p14ARF and p16INK4a only), CHEK2, CTNNA1, DICER1, EPCAM (Deletion/duplication testing only), FH, GREM1 (promoter region duplication testing only), HOXB13, KIT, MBD4, MEN1, MLH1, MSH2, MSH3, MSH6, MUTYH, NF1, NHTL1, PALB2, PDGFRA, PMS2, POLD1, POLE, PRKAR1A, PTEN, RAD51C, RAD51D, RET, SMAD4, SMARCA4. STK11, TP53, TSC1, TSC2, and VHL.     This is a pleasant 72 year old female patient with past medical history significant for COPD, not oxygen dependent, irritable bowel syndrome, diarrhea predominant depression, well-controlled on Zoloft, anxiety, arthritis referred to breast [facility] for new diagnosis of left breast invasive ductal carcinoma.  She declined adjuvant chemotherapy.    Discussed the use of AI scribe software for clinical note transcription with the patient, who gave verbal consent to proceed.  History of Present Illness        The patient, with a history of radiation therapy for breast cancer presents with persistent lymphedema and a new onset of wrist inflammation. She reports a rash on her chest following radiation, which was managed with hydrocortisone cream. She has been undergoing physical therapy for lymphedema, but still experiences significant swelling in her armpit and under her breast. She is wearing a compression sleeve for management.  The patient also reports a recent visit to an orthopedic doctor for suspected wrist fracture. The wrist was swollen and discolored, but no fracture was identified on x-ray. The orthopedic doctor diagnosed it as inflammation and prescribed a 12-day course of prednisone. The patient also reports intermittent dizziness, which occurs randomly and is not associated with eating or not eating. The frequency of the dizziness has remained consistent.  The patient is on medication for high cholesterol and bone health. She has a history of Mohs surgery on her lip and has had spots on her nose and eyebrow  treated  by a dermatologist. She has a black spot on her skin, which she plans to show to her dermatologist at her next visit.  Rest of the pertinent 10 point ROS reviewed and neg.  MEDICAL HISTORY:  Past Medical History:  Diagnosis Date   Adenomatous colon polyp    Allergy    Anxiety    Arthritis    bilateral hands. lower back   Cancer [facility])    skin   Cataract    bilateral   COPD (chronic obstructive pulmonary disease) (HCC)    Depression    Diverticulosis    Endometriosis    GERD (gastroesophageal reflux disease)    Glaucoma    Headache    Migraines   Hiatal hernia    History of left breast cancer 05/2023   IBS (irritable bowel syndrome)    Inguinal hernia    Pneumothorax 1987   PONV (postoperative nausea and vomiting)     SURGICAL HISTORY: Past Surgical History:  Procedure Laterality Date   ABDOMINAL HYSTERECTOMY  1992   ANTERIOR CERVICAL DECOMP/DISCECTOMY FUSION N/A 09/23/2022   Procedure: Anterior Decompression Fusion,PLATE/SCREWS Cervical five-six; REMOVAL OLD PLATE;  Surgeon: Tressie Stalker, MD;  Location: [facility] OR;  Service: Neurosurgery;  Laterality: N/A;   APPENDECTOMY  1992   BACK SURGERY     cervical   BREAST BIOPSY Left 06/23/2023   [place] LT BREAST BX W LOC DEV 1ST LESION IMG BX SPEC US GUIDE 06/23/2023 [facility] MAMMOGRAPHY   BREAST BIOPSY Left 07/28/2023   [place] LT RADIOACTIVE SEED LOC 07/28/2023 [facility] MAMMOGRAPHY   BREAST LUMPECTOMY WITH RADIOACTIVE SEED AND SENTINEL LYMPH NODE BIOPSY Left 07/29/2023   Procedure: LEFT BREAST SEED LUMPECTOMY WITH LEFT SENTINEL LYMPH NODE MAPPING;  Surgeon: Harriette Bouillon, MD;  Location: [facility] OR;  Service: General;  Laterality: Left;  PEC BLOCK   CATARACT EXTRACTION Bilateral    2021, 2022   COLONOSCOPY  2023   PLEURAL SCARIFICATION     ruptured disk     torn tendon     right arm   TUBAL LIGATION      SOCIAL HISTORY: Social History   Socioeconomic History   Marital status: Married    Spouse name: Not on file   Number of  children: 2   Years of education: Not on file   Highest education level: Not on file  Occupational History   Occupation: locksmith  Tobacco Use   Smoking status: Former    Current packs/day: 0.00    Average packs/day: 1.5 packs/day for 40.1 years (60.2 ttl pk-yrs)    Types: Cigarettes    Start date: 12/31/1971    Quit date: 02/10/2012    Years since quitting: 11.7   Smokeless tobacco: Never  Vaping Use   Vaping status: Never Used  Substance and Sexual Activity   Alcohol use: No   Drug use: No   Sexual activity: Never  Other Topics Concern   Not on file  Social History Narrative   Not on file   Social Determinants of Health   Financial Resource Strain: Not on file  Food Insecurity: No Food Insecurity (09/15/2023)   Hunger Vital Sign    Worried About Running Out of Food in the Last Year: Never true    Ran Out of Food in the Last Year: Never true  Transportation Needs: No Transportation Needs (09/15/2023)   PRAPARE - Administrator, Civil Service (Medical): No    Lack of Transportation (Non-Medical): No  Physical Activity: Not on file  Stress: Not on file  Social Connections: Unknown (05/12/2022)   [facility]   Social Network    Social Network: Not on file  Intimate Partner Violence: Not At Risk (09/15/2023)   Humiliation, Afraid, Rape, and Kick questionnaire    Fear of Current or Ex-Partner: No    Emotionally Abused: No    Physically Abused: No    Sexually Abused: No    FAMILY HISTORY: Family History  Problem Relation Age of Onset   Heart failure Mother    Crohn's disease Sister    Osteoporosis Sister    Neuropathy Sister    Colon polyps Sister    Lung cancer Sister 21       smoked   Thyroid disease Sister    Thyroid cancer Maternal Grandmother    Colon cancer Neg Hx    Breast cancer Neg Hx    Esophageal cancer Neg Hx    Pancreatic cancer Neg Hx    Stomach cancer Neg Hx     ALLERGIES:  is allergic to bupropion,  cefaclor, elemental sulfur, sulfa antibiotics, latex, and ofloxacin.  MEDICATIONS:  Current Outpatient Medications  Medication Sig Dispense Refill   albuterol (VENTOLIN HFA) 108 (90 Base) MCG/ACT inhaler Inhale 2 puffs into the lungs every 6 (six) hours as needed. 18 g 5   ALPRAZolam (XANAX) 0.25 MG tablet Take 0.25 mg by mouth 3 (three) times daily as needed for anxiety.     aspirin EC 81 MG tablet Take 81 mg by mouth in the morning. Swallow whole.     Bacillus Coagulans-Inulin (PROBIOTIC) 1-250 BILLION-MG CAPS Take 1 capsule by mouth every evening.     cetirizine (ZYRTEC) 10 MG tablet Take 1 tablet (10 mg total) by mouth daily. 30 tablet 11   Cholecalciferol (VITAMIN D PO) Take 5,000 Units by mouth in the morning.     denosumab (PROLIA) 60 MG/ML SOSY injection Inject 60 mg into the skin every 6 (six) months.     famotidine (PEPCID) 20 MG tablet TAKE 1 TABLET (20 MG TOTAL) BY MOUTH 2 (TWO) TIMES DAILY. AS NEEDED 180 tablet 2   fluticasone (FLONASE) 50 MCG/ACT nasal spray Place into both nostrils.     inclisiran (LEQVIO) 284 MG/1.5ML SOSY injection Inject 284 mg into the skin every 6 (six) months. Every three months     loperamide (IMODIUM) 2 MG capsule Take 2 mg by mouth as needed for diarrhea or loose stools.     MELATONIN-CHAMOMILE PO Take 1 tablet by mouth at bedtime.     promethazine (PHENERGAN) 12.5 MG suppository Place 12.5 mg rectally every 6 (six) hours as needed for nausea or vomiting.     sertraline (ZOLOFT) 100 MG tablet Take 50 mg by mouth in the morning.     No current facility-administered medications for this visit.    REVIEW OF SYSTEMS:   Constitutional: Denies fevers, chills or abnormal night sweats Eyes: Denies blurriness of vision, double vision or watery eyes Ears, nose, mouth, throat, and face: Denies mucositis or sore throat Respiratory: Denies cough, dyspnea or wheezes Cardiovascular: Denies palpitation, chest discomfort or lower extremity  swelling Gastrointestinal:  Denies nausea, heartburn or change in bowel habits Skin: Denies abnormal skin rashes Lymphatics: Denies new lymphadenopathy or easy bruising Neurological:Denies numbness, tingling or new weaknesses Behavioral/Psych: Mood is stable, no new changes  All other systems were reviewed with the patient and are negative.  PHYSICAL EXAMINATION: ECOG PERFORMANCE STATUS: 0 - Asymptomatic  Vitals:   11/11/23 1336  BP: (!) 147/67  Pulse: 72  Resp: 18  Temp: (!) 97.5 F (36.4 C)  SpO2: 98%    Filed Weights   11/11/23 1336  Weight: 218 lb 14.4 oz (99.3 kg)   PE deferred in lieu of counseling.  LABORATORY DATA:  I have reviewed the data as listed Lab Results  Component Value Date   WBC 7.6 10/01/2023   HGB 13.7 10/01/2023   HCT 40.9 10/01/2023   MCV 94.5 10/01/2023   PLT 258 10/01/2023   Lab Results  Component Value Date   NA 141 10/01/2023   K 4.7 10/01/2023   CL 108 10/01/2023   CO2 28 10/01/2023    RADIOGRAPHIC STUDIES: I have personally reviewed the radiological reports and agreed with the findings in the report.  ASSESSMENT AND PLAN:  Malignant neoplasm of upper-outer quadrant of left breast in female, estrogen receptor negative (HCC) This is a pleasant 72 year old postmenopausal female patient with newly diagnosed left breast T2 N0 triple negative invasive ductal carcinoma referred to breast [facility] for additional recommendations.  Given triple negative biology and size of the tumor over 2 cm, we have discussed about both neoadjuvant and adjuvant approach. I have discussed neoadjuvant chemotherapy options including keynote 522 versus SCARLET trial versus adjuvant approach.  She has decent performance status but is mostly limited by her diarrhea predominant IBS as well as COPD given her prior history of smoking.  After much discussion we have discussed about proceeding with upfront surgery followed by consideration for adjuvant chemotherapy. She  understands that the benefit of adjuvant chemo is to reduce the risk of recurrence.  She did not however want to proceed with chemotherapy.  She has now completed radiation and is here to establish for surveillance.  I have recommended annual diagnostic mammogram for 5 years and every 27-month history and physical or sooner as needed.  She was clearly instructed to call us with any new symptoms.  There is no role for antiestrogen therapy, this is ER/PR negative tumor.  Lymphedema post-radiation -Persistent despite physical therapy and compression. No evidence of infection or cellulitis. -Continue physical therapy exercises at home. -Consider additional interventions if no improvement.  Wrist inflammation No fracture identified on X-ray. Orthopedic doctor prescribed prednisone for 12 days. -Continue prednisone as prescribed. -Monitor for improvement.  Lung nodule Likely inflammatory. Repeat CT chest ordered for follow-up. -CT chest to be performed on 12/26/2023. -Call patient with results in the first week of the new year.  Dizziness Random episodes, not related to meals or medication. No change in frequency. -Monitor symptoms. -Notify doctor if symptoms persist or worsen.  General Health Maintenance -Continue Prolia for bone health. -Continue cholesterol medication. -Annual mammogram for breast cancer surveillance. -Follow-up in 6 months.  She will return to clinic as scheduled   All questions were answered. The patient knows to call the clinic with any problems, questions or concerns.    Rachel Moulds, MD 11/11/23

## 2023-11-11 NOTE — Assessment & Plan Note (Signed)
This is a pleasant 72 year old postmenopausal female patient with newly diagnosed left breast T2 N0 triple negative invasive ductal carcinoma referred to breast [facility] for additional recommendations.  Given triple negative biology and size of the tumor over 2 cm, we have discussed about both neoadjuvant and adjuvant approach. I have discussed neoadjuvant chemotherapy options including keynote 522 versus SCARLET trial versus adjuvant approach.  She has decent performance status but is mostly limited by her diarrhea predominant IBS as well as COPD given her prior history of smoking.  After much discussion we have discussed about proceeding with upfront surgery followed by consideration for adjuvant chemotherapy. She understands that the benefit of adjuvant chemo is to reduce the risk of recurrence.  She did not however want to proceed with chemotherapy.  She has now completed radiation and is here to establish for surveillance.  I have recommended annual diagnostic mammogram for 5 years and every 75-month history and physical or sooner as needed.  She was clearly instructed to call us with any new symptoms.  There is no role for antiestrogen therapy, this is ER/PR negative tumor.  Lymphedema post-radiation -Persistent despite physical therapy and compression. No evidence of infection or cellulitis. -Continue physical therapy exercises at home. -Consider additional interventions if no improvement.  Wrist inflammation No fracture identified on X-ray. Orthopedic doctor prescribed prednisone for 12 days. -Continue prednisone as prescribed. -Monitor for improvement.  Lung nodule Likely inflammatory. Repeat CT chest ordered for follow-up. -CT chest to be performed on 12/26/2023. -Call patient with results in the first week of the new year.  Dizziness Random episodes, not related to meals or medication. No change in frequency. -Monitor symptoms. -Notify doctor if symptoms persist or worsen.  General Health  Maintenance -Continue Prolia for bone health. -Continue cholesterol medication. -Annual mammogram for breast cancer surveillance. -Follow-up in 6 months.  She will return to clinic as scheduled

## 2023-11-12 ENCOUNTER — Ambulatory Visit: Payer: Medicare Other

## 2023-11-12 ENCOUNTER — Telehealth: Payer: Self-pay | Admitting: *Deleted

## 2023-11-12 NOTE — Telephone Encounter (Signed)
Called patient to ask about rescheduling fu appt. On 11-17-23 due to Dr. Roselind Messier being in the OR, lvm for a return call

## 2023-11-17 ENCOUNTER — Ambulatory Visit: Payer: Medicare Other | Admitting: Radiation Oncology

## 2023-11-18 DIAGNOSIS — X32XXXD Exposure to sunlight, subsequent encounter: Secondary | ICD-10-CM | POA: Diagnosis not present

## 2023-11-18 DIAGNOSIS — L570 Actinic keratosis: Secondary | ICD-10-CM | POA: Diagnosis not present

## 2023-11-18 DIAGNOSIS — Z85828 Personal history of other malignant neoplasm of skin: Secondary | ICD-10-CM | POA: Diagnosis not present

## 2023-11-18 DIAGNOSIS — Z08 Encounter for follow-up examination after completed treatment for malignant neoplasm: Secondary | ICD-10-CM | POA: Diagnosis not present

## 2023-11-21 DIAGNOSIS — I2584 Coronary atherosclerosis due to calcified coronary lesion: Secondary | ICD-10-CM | POA: Diagnosis not present

## 2023-11-21 DIAGNOSIS — R911 Solitary pulmonary nodule: Secondary | ICD-10-CM | POA: Diagnosis not present

## 2023-11-21 DIAGNOSIS — J449 Chronic obstructive pulmonary disease, unspecified: Secondary | ICD-10-CM | POA: Diagnosis not present

## 2023-11-21 DIAGNOSIS — H409 Unspecified glaucoma: Secondary | ICD-10-CM | POA: Diagnosis not present

## 2023-11-21 DIAGNOSIS — C50412 Malignant neoplasm of upper-outer quadrant of left female breast: Secondary | ICD-10-CM | POA: Diagnosis not present

## 2023-11-21 DIAGNOSIS — F418 Other specified anxiety disorders: Secondary | ICD-10-CM | POA: Diagnosis not present

## 2023-11-21 DIAGNOSIS — M810 Age-related osteoporosis without current pathological fracture: Secondary | ICD-10-CM | POA: Diagnosis not present

## 2023-11-21 DIAGNOSIS — I890 Lymphedema, not elsewhere classified: Secondary | ICD-10-CM | POA: Diagnosis not present

## 2023-11-21 DIAGNOSIS — N1831 Chronic kidney disease, stage 3a: Secondary | ICD-10-CM | POA: Diagnosis not present

## 2023-11-21 DIAGNOSIS — I700 Atherosclerosis of aorta: Secondary | ICD-10-CM | POA: Diagnosis not present

## 2023-11-21 DIAGNOSIS — G43909 Migraine, unspecified, not intractable, without status migrainosus: Secondary | ICD-10-CM | POA: Diagnosis not present

## 2023-11-21 DIAGNOSIS — E049 Nontoxic goiter, unspecified: Secondary | ICD-10-CM | POA: Diagnosis not present

## 2023-11-25 DIAGNOSIS — H11431 Conjunctival hyperemia, right eye: Secondary | ICD-10-CM | POA: Diagnosis not present

## 2023-12-08 ENCOUNTER — Other Ambulatory Visit: Payer: Self-pay | Admitting: *Deleted

## 2023-12-08 ENCOUNTER — Telehealth: Payer: Self-pay | Admitting: *Deleted

## 2023-12-08 DIAGNOSIS — Z171 Estrogen receptor negative status [ER-]: Secondary | ICD-10-CM

## 2023-12-08 NOTE — Telephone Encounter (Signed)
Patient called and requested assistance with making appt for CT scan. She said she was supposed to have it on or before 12/26/23 but no one has called her to schedule it.  She has phone appt w/Dr. Al Pimple 01/05/24 to review results from this scan. Contacted Central Scheduling for Radiology and spoke with Peggy. CT scheduled for 12/29/23 at 1230 with labs same day at 1130 at [facility].  Contacted patient with appt times. Patient verbalized understanding and thanked Clinical research associate for scheduling appts.

## 2023-12-09 DIAGNOSIS — H6993 Unspecified Eustachian tube disorder, bilateral: Secondary | ICD-10-CM | POA: Diagnosis not present

## 2023-12-09 DIAGNOSIS — Z853 Personal history of malignant neoplasm of breast: Secondary | ICD-10-CM | POA: Diagnosis not present

## 2023-12-09 DIAGNOSIS — H109 Unspecified conjunctivitis: Secondary | ICD-10-CM | POA: Diagnosis not present

## 2023-12-09 DIAGNOSIS — J019 Acute sinusitis, unspecified: Secondary | ICD-10-CM | POA: Diagnosis not present

## 2023-12-09 DIAGNOSIS — H9209 Otalgia, unspecified ear: Secondary | ICD-10-CM | POA: Diagnosis not present

## 2023-12-09 DIAGNOSIS — J449 Chronic obstructive pulmonary disease, unspecified: Secondary | ICD-10-CM | POA: Diagnosis not present

## 2023-12-09 DIAGNOSIS — I890 Lymphedema, not elsewhere classified: Secondary | ICD-10-CM | POA: Diagnosis not present

## 2023-12-16 ENCOUNTER — Encounter: Payer: Self-pay | Admitting: Radiation Oncology

## 2023-12-17 NOTE — Progress Notes (Signed)
  Radiation Oncology         (336) [phone] ________________________________  Name: [name] [name] MRN: [id number]  Date: 12/18/2023  DOB: [birth year]  End of Treatment Note  Diagnosis:  Left Breast UOQ, Invasive and in situ ductal carcinoma, ER- / PR- / Her2-, Grade 3: s/p left breast lumpectomy and left axillary nodal excisions (clear margins and negative nodes)      Indication for treatment: Curative        Radiation treatment dates: First Treatment Date: 2023-09-25 - Last Treatment Date: 2023-10-17  Site/Dose/Technique/Mode:   Site: Breast, Left Technique: 3D Mode: Photon Dose Per Fraction: 2.67 Gy Prescribed Dose (Delivered / Prescribed): 42.72 Gy / 42.72 Gy Prescribed Fxs (Delivered / Prescribed): 16 / 16  Narrative: The patient tolerated radiation treatment relatively well. During her final weekly treatment check on 10/14/23, the patient endorsed left axillary pain due to cording, fatigue, hives, left breast swelling. Physical exam performed that same day showed mild erythema and swelling centrally within the breast which did not improve with antibiotics. Mild radiation dermatitis was also noted and she was advised to obtain hydrocortisone cream for this issue.   Plan: The patient has completed radiation treatment. The patient will return to radiation oncology clinic for routine followup in one month. I advised them to call or return sooner if they have any questions or concerns related to their recovery or treatment.  -----------------------------------  Billie Lade, PhD, MD  This document serves as a record of services personally performed by Antony Blackbird, MD. It was created on his behalf by Neena Rhymes, a trained medical scribe. The creation of this record is based on the scribe's personal observations and the provider's statements to them. This document has been checked and approved by the attending provider.

## 2023-12-17 NOTE — Progress Notes (Signed)
Radiation Oncology         (336) [phone] ________________________________  Name: [name] [name] MRN: [id number]  Date: 12/18/2023  DOB: [birth year]  Follow-Up Visit Note  CC: Merri Brunette, MD  Merri Brunette, MD  No diagnosis found.  Diagnosis:  Left Breast UOQ, Invasive and in situ ductal carcinoma, ER- / PR- / Her2-, Grade 3: s/p left breast lumpectomy and left axillary nodal excisions (clear margins and negative nodes)      Interval Since Last Radiation: 2 months and 1 day  Indication for treatment: Curative         Radiation treatment dates: First Treatment Date: 2023-09-25 - Last Treatment Date: 2023-10-17   Site/Dose/Technique/Mode:    Site: Breast, Left Technique: 3D Mode: Photon Dose Per Fraction: 2.67 Gy Prescribed Dose (Delivered / Prescribed): 42.72 Gy / 42.72 Gy Prescribed Fxs (Delivered / Prescribed): 16 / 16  Narrative:  The patient returns today for routine follow-up. She tolerated radiation treatment relatively well. During her final weekly treatment check on 10/14/23, the patient endorsed left axillary pain due to cording, fatigue, hives, left breast swelling. Physical exam performed that same day showed mild erythema and swelling centrally within the breast which did not improve with antibiotics. Mild radiation dermatitis was also noted and she was advised to obtain hydrocortisone cream for this issue.    She recently followed up with Dr. Al Pimple on 11/11/23. To review, the patient declined chemotherapy, and Dr. Al Pimple has recommended that she proceed with annual diagnostic imaging for surveillance.                Pertinent imaging performed in the interval since her consultation date includes:  -- CT of the neck with contrast on 09/17 which showed no acute or suspicious findings in the neck, and no evidence of pathologic lymphadenopathy.     -- Chest CT with contrast on 09/16/23 for follow-up a right middle lobe nodularity: showed a slightly fluctuating nodularity  in the lateral segment of the RML, the largest of which measuring 1.0 cm.              Allergies:  is allergic to bupropion, cefaclor, elemental sulfur, sulfa antibiotics, latex, and ofloxacin.  Meds: Current Outpatient Medications  Medication Sig Dispense Refill   albuterol (VENTOLIN HFA) 108 (90 Base) MCG/ACT inhaler Inhale 2 puffs into the lungs every 6 (six) hours as needed. 18 g 5   ALPRAZolam (XANAX) 0.25 MG tablet Take 0.25 mg by mouth 3 (three) times daily as needed for anxiety.     aspirin EC 81 MG tablet Take 81 mg by mouth in the morning. Swallow whole.     Bacillus Coagulans-Inulin (PROBIOTIC) 1-250 BILLION-MG CAPS Take 1 capsule by mouth every evening.     cetirizine (ZYRTEC) 10 MG tablet Take 1 tablet (10 mg total) by mouth daily. 30 tablet 11   Cholecalciferol (VITAMIN D PO) Take 5,000 Units by mouth in the morning.     denosumab (PROLIA) 60 MG/ML SOSY injection Inject 60 mg into the skin every 6 (six) months.     famotidine (PEPCID) 20 MG tablet TAKE 1 TABLET (20 MG TOTAL) BY MOUTH 2 (TWO) TIMES DAILY. AS NEEDED 180 tablet 2   fluticasone (FLONASE) 50 MCG/ACT nasal spray Place into both nostrils.     inclisiran (LEQVIO) 284 MG/1.5ML SOSY injection Inject 284 mg into the skin every 6 (six) months. Every three months     loperamide (IMODIUM) 2 MG capsule Take 2 mg by mouth as needed for  diarrhea or loose stools.     MELATONIN-CHAMOMILE PO Take 1 tablet by mouth at bedtime.     promethazine (PHENERGAN) 12.5 MG suppository Place 12.5 mg rectally every 6 (six) hours as needed for nausea or vomiting.     sertraline (ZOLOFT) 100 MG tablet Take 50 mg by mouth in the morning.     No current facility-administered medications for this encounter.    Physical Findings: The patient is in no acute distress. Patient is alert and oriented.  vitals were not taken for this visit. .  No significant changes. Lungs are clear to auscultation bilaterally. Heart has regular rate and rhythm. No  palpable cervical, supraclavicular, or axillary adenopathy. Abdomen soft, non-tender, normal bowel sounds.  Right Breast: no palpable mass, nipple discharge or bleeding. Left Breast: ***  Lab Findings: Lab Results  Component Value Date   WBC 7.6 10/01/2023   HGB 13.7 10/01/2023   HCT 40.9 10/01/2023   MCV 94.5 10/01/2023   PLT 258 10/01/2023    Radiographic Findings: No results found.  Impression:   Left Breast UOQ, Invasive and in situ ductal carcinoma, ER- / PR- / Her2-, Grade 3: s/p left breast lumpectomy and left axillary nodal excisions (clear margins and negative nodes)      The patient is recovering from the effects of radiation.  ***  Plan:  ***   *** minutes of total time was spent for this patient encounter, including preparation, face-to-face counseling with the patient and coordination of care, physical exam, and documentation of the encounter. ____________________________________  Billie Lade, PhD, MD  This document serves as a record of services personally performed by Antony Blackbird, MD. It was created on his behalf by Neena Rhymes, a trained medical scribe. The creation of this record is based on the scribe's personal observations and the provider's statements to them. This document has been checked and approved by the attending provider.

## 2023-12-18 ENCOUNTER — Ambulatory Visit
Admission: RE | Admit: 2023-12-18 | Discharge: 2023-12-18 | Disposition: A | Discharge status: Home / Self Care | Payer: Medicare Other | Source: Ambulatory Visit | Attending: Radiation Oncology | Admitting: Radiation Oncology

## 2023-12-18 ENCOUNTER — Encounter: Payer: Self-pay | Admitting: Radiation Oncology

## 2023-12-18 VITALS — BP 127/77 | HR 77 | Temp 97.6°F | Resp 20 | Ht 69.5 in | Wt 218.6 lb

## 2023-12-18 DIAGNOSIS — C50412 Malignant neoplasm of upper-outer quadrant of left female breast: Secondary | ICD-10-CM

## 2023-12-18 HISTORY — DX: Personal history of irradiation: Z92.3

## 2023-12-18 NOTE — Progress Notes (Signed)
JAZELYNN FRONING is here today for follow up post radiation to the breast.   Breast Side: Left   They completed their radiation on: 10/17/23   Does the patient complain of any of the following: Post radiation skin issues: No Breast Tenderness:  Patient reports pain when touching left breast.  Breast Swelling: Intermittently to the left breast.  Lymphadema: Yes Range of Motion limitations: Yes, continues to have cording to left axilla.  Fatigue post radiation: Continues to have mild fatigue.  Appetite good/fair/poor: fair  Additional comments if applicable: Patient asking if seed is still inside left breast.   BP 127/77 (BP Location: Right Arm, Patient Position: Sitting, Cuff Size: Normal)   Pulse 77   Temp 97.6 F (36.4 C)   Resp 20   Ht 5' 9.5" (1.765 m)   Wt 218 lb 9.6 oz (99.2 kg)   SpO2 95%   BMI 31.82 kg/m

## 2023-12-29 ENCOUNTER — Inpatient Hospital Stay: Payer: Medicare Other | Attending: Hematology and Oncology

## 2023-12-29 ENCOUNTER — Ambulatory Visit (HOSPITAL_COMMUNITY)
Admission: RE | Admit: 2023-12-29 | Discharge: 2023-12-29 | Disposition: A | Discharge status: Home / Self Care | Payer: Medicare Other | Source: Ambulatory Visit | Attending: Hematology and Oncology | Admitting: Hematology and Oncology

## 2023-12-29 DIAGNOSIS — Z171 Estrogen receptor negative status [ER-]: Secondary | ICD-10-CM | POA: Insufficient documentation

## 2023-12-29 DIAGNOSIS — I700 Atherosclerosis of aorta: Secondary | ICD-10-CM | POA: Diagnosis not present

## 2023-12-29 DIAGNOSIS — C50412 Malignant neoplasm of upper-outer quadrant of left female breast: Secondary | ICD-10-CM | POA: Insufficient documentation

## 2023-12-29 DIAGNOSIS — Z79899 Other long term (current) drug therapy: Secondary | ICD-10-CM | POA: Diagnosis not present

## 2023-12-29 DIAGNOSIS — K449 Diaphragmatic hernia without obstruction or gangrene: Secondary | ICD-10-CM | POA: Insufficient documentation

## 2023-12-29 DIAGNOSIS — J439 Emphysema, unspecified: Secondary | ICD-10-CM | POA: Diagnosis not present

## 2023-12-29 LAB — CBC WITH DIFFERENTIAL (CANCER CENTER ONLY)
Abs Immature Granulocytes: 0.02 10*3/uL (ref 0.00–0.07)
Basophils Absolute: 0 10*3/uL (ref 0.0–0.1)
Basophils Relative: 1 %
Eosinophils Absolute: 0.2 10*3/uL (ref 0.0–0.5)
Eosinophils Relative: 3 %
HCT: 42 % (ref 36.0–46.0)
Hemoglobin: 13.6 g/dL (ref 12.0–15.0)
Immature Granulocytes: 0 %
Lymphocytes Relative: 17 %
Lymphs Abs: 1.1 10*3/uL (ref 0.7–4.0)
MCH: 30 pg (ref 26.0–34.0)
MCHC: 32.4 g/dL (ref 30.0–36.0)
MCV: 92.7 fL (ref 80.0–100.0)
Monocytes Absolute: 0.4 10*3/uL (ref 0.1–1.0)
Monocytes Relative: 6 %
Neutro Abs: 4.6 10*3/uL (ref 1.7–7.7)
Neutrophils Relative %: 73 %
Platelet Count: 265 10*3/uL (ref 150–400)
RBC: 4.53 MIL/uL (ref 3.87–5.11)
RDW: 14.1 % (ref 11.5–15.5)
WBC Count: 6.3 10*3/uL (ref 4.0–10.5)
nRBC: 0 % (ref 0.0–0.2)

## 2023-12-29 LAB — CMP (CANCER CENTER ONLY)
ALT: 9 U/L (ref 0–44)
AST: 13 U/L — ABNORMAL LOW (ref 15–41)
Albumin: 3.9 g/dL (ref 3.5–5.0)
Alkaline Phosphatase: 66 U/L (ref 38–126)
Anion gap: 7 (ref 5–15)
BUN: 20 mg/dL (ref 8–23)
CO2: 29 mmol/L (ref 22–32)
Calcium: 9.5 mg/dL (ref 8.9–10.3)
Chloride: 105 mmol/L (ref 98–111)
Creatinine: 1.26 mg/dL — ABNORMAL HIGH (ref 0.44–1.00)
GFR, Estimated: 45 mL/min — ABNORMAL LOW (ref >60)
Glucose, Bld: 98 mg/dL (ref 70–99)
Potassium: 4.6 mmol/L (ref 3.5–5.1)
Sodium: 141 mmol/L (ref 135–145)
Total Bilirubin: 0.4 mg/dL (ref 0.0–1.2)
Total Protein: 7.1 g/dL (ref 6.5–8.1)

## 2023-12-29 MED ORDER — IOHEXOL 300 MG/ML  SOLN
65.0000 mL | Freq: Once | INTRAMUSCULAR | Status: AC | PRN
  Administered 2023-12-29: 65 mL via INTRAVENOUS

## 2024-01-02 ENCOUNTER — Encounter: Payer: Self-pay | Admitting: Pulmonary Disease

## 2024-01-05 ENCOUNTER — Inpatient Hospital Stay: Payer: BLUE CROSS/BLUE SHIELD | Attending: Hematology and Oncology | Admitting: Hematology and Oncology

## 2024-01-05 DIAGNOSIS — C50412 Malignant neoplasm of upper-outer quadrant of left female breast: Secondary | ICD-10-CM | POA: Diagnosis not present

## 2024-01-05 DIAGNOSIS — Z171 Estrogen receptor negative status [ER-]: Secondary | ICD-10-CM | POA: Diagnosis not present

## 2024-01-05 NOTE — Assessment & Plan Note (Addendum)
 This is a pleasant 73 year old postmenopausal female patient with newly diagnosed left breast T2 N0 triple negative invasive ductal carcinoma referred to breast [facility] for additional recommendations.  Given triple negative biology and size of the tumor over 2 cm, we have discussed about both neoadjuvant and adjuvant approach. I have discussed neoadjuvant chemotherapy options including keynote 522 versus SCARLET trial versus adjuvant approach.  She has decent performance status but is mostly limited by her diarrhea predominant IBS as well as COPD given her prior history of smoking.  After much discussion we have discussed about proceeding with upfront surgery followed by consideration for adjuvant chemotherapy. She understands that the benefit of adjuvant chemo is to reduce the risk of recurrence.  She did not however want to proceed with chemotherapy.   She is here for a follow-up telephone call to review the CT results.  We have reviewed the CT results which shows persistent lung nodules, some of them have increased and some of them have decreased.  Recommendation was to continue follow-up.  With regards to the left breast swelling, explained that there is no mention of any abnormalities on the CT however I wonder if she continues to have posttreatment changes hence have sent a message to Dr. Vanderbilt as well as Dr. Shannon to see if there is any role for aspiration.  She is already undergoing physical therapy.   I have also encouraged her to reach out back to us  if she has any new questions.

## 2024-01-05 NOTE — Progress Notes (Signed)
  Cancer Center CONSULT NOTE  Patient Care Team: Clarice Nottingham, MD as PCP - General (Internal Medicine) Loretha Ash, MD as Consulting Physician (Hematology and Oncology) Vanderbilt Ned, MD as Consulting Physician (General Surgery) Shannon Agent, MD as Consulting Physician (Radiation Oncology) Tyree Nanetta SAILOR, RN as Oncology Nurse Navigator Glean Stephane BROCKS, RN as Oncology Nurse Navigator  CHIEF COMPLAINTS/PURPOSE OF CONSULTATION:  Newly diagnosed breast cancer  HISTORY OF PRESENTING ILLNESS:  [name] [name] 73 y.o. female is here because of recent diagnosis of left breast cancer  I reviewed her records extensively and collaborated the history with the patient.  SUMMARY OF ONCOLOGIC HISTORY: Oncology History  Malignant neoplasm of upper-outer quadrant of left breast in female, estrogen receptor negative (HCC)  06/03/2023 Mammogram   Screening mammogram bilaterally showed possible mass in the left breast warranting further evaluation.  Diagnostic mammogram confirmed highly suspicious 2.2 cm upper outer left breast mass, no abnormal appearing left axillary lymph nodes   06/23/2023 Pathology Results   Pathology results from the left breast needle core biopsy showed grade 3 invasive poorly differentiated ductal carcinoma, focal high-grade DCIS, cribriform type, negative for angiolymphatic invasion, negative for micro calcs, prognostic showed ER 0% negative, PR 0% negative, HER2 negative and Ki-67 of 95%.   06/30/2023 Initial Diagnosis   Malignant neoplasm of upper-outer quadrant of left breast in female, estrogen receptor negative (HCC)    Genetic Testing   [facility] Custom Panel+RNA was Negative. Of note, a variant of uncertain significance was detected in the MSH3 gene (c.1027+4T>C (Intronic)). Report date is 07/10/2023.  The Custom Hereditary Cancers Panel offered by [facility] includes sequencing and/or deletion duplication testing of the following 46 genes: APC, ATM, AXIN2,  BAP1, BARD1, BMPR1A, BRCA1, BRCA2, BRIP1, CDH1, CDK4, CDKN2A (p14ARF and p16INK4a only), CHEK2, CTNNA1, DICER1, EPCAM (Deletion/duplication testing only), FH, GREM1 (promoter region duplication testing only), HOXB13, KIT, MBD4, MEN1, MLH1, MSH2, MSH3, MSH6, MUTYH, NF1, NHTL1, PALB2, PDGFRA, PMS2, POLD1, POLE, PRKAR1A, PTEN, RAD51C, RAD51D, RET, SMAD4, SMARCA4. STK11, TP53, TSC1, TSC2, and VHL.     This is a pleasant 73 year old female patient with past medical history significant for COPD, not oxygen dependent, irritable bowel syndrome, diarrhea predominant depression, well-controlled on Zoloft, anxiety, arthritis referred to breast [facility] for new diagnosis of left breast invasive ductal carcinoma.  She declined adjuvant chemotherapy.    Discussed the use of AI scribe software for clinical note transcription with the patient, who gave verbal consent to proceed.  History of Present Illness    The patient, with a history of radiation therapy for breast cancer presents for follow up telephone visit.  She is still upset about her breast being extremely painful.  She says there is a lot of swelling in the breast.  The follow-up telephone visit was to review her CT results. Rest of the pertinent 10 point ROS reviewed and neg.  MEDICAL HISTORY:  Past Medical History:  Diagnosis Date   Adenomatous colon polyp    Allergy    Anxiety    Arthritis    bilateral hands. lower back   Cancer [facility])    skin   Cataract    bilateral   COPD (chronic obstructive pulmonary disease) (HCC)    Depression    Diverticulosis    Endometriosis    GERD (gastroesophageal reflux disease)    Glaucoma    Headache    Migraines   Hiatal hernia    History of left breast cancer 05/2023   History of radiation therapy  Left breast- 09/25/23-10/17/23- Dr. Lynwood Nasuti   IBS (irritable bowel syndrome)    Inguinal hernia    Pneumothorax 1987   PONV (postoperative nausea and vomiting)     SURGICAL HISTORY: Past Surgical  History:  Procedure Laterality Date   ABDOMINAL HYSTERECTOMY  1992   ANTERIOR CERVICAL DECOMP/DISCECTOMY FUSION N/A 09/23/2022   Procedure: Anterior Decompression Fusion,PLATE/SCREWS Cervical five-six; REMOVAL OLD PLATE;  Surgeon: Mavis Purchase, MD;  Location: [facility] OR;  Service: Neurosurgery;  Laterality: N/A;   APPENDECTOMY  1992   BACK SURGERY     cervical   BREAST BIOPSY Left 06/23/2023   US  LT BREAST BX W LOC DEV 1ST LESION IMG BX SPEC US  GUIDE 06/23/2023 [facility] M[facility]MOGR[facility]   BREAST BIOPSY Left 07/28/2023   US  LT RADIOACTIVE SEED LOC 07/28/2023 [facility] MAMMOGRAPHY   BREAST LUMPECTOMY WITH RADIOACTIVE SEED AND SENTINEL LYMPH NODE BIOPSY Left 07/29/2023   Procedure: LEFT BREAST SEED LUMPECTOMY WITH LEFT SENTINEL LYMPH NODE MAPPING;  Surgeon: Vanderbilt Ned, MD;  Location: [facility] OR;  Service: General;  Laterality: Left;  PEC BLOCK   CATARACT EXTRACTION Bilateral    2021, 2022   COLONOSCOPY  2023   PLEURAL SCARIFICATION     ruptured disk     torn tendon     right arm   TUBAL LIGATION      SOCIAL HISTORY: Social History   Socioeconomic History   Marital status: Married    Spouse name: Not on file   Number of children: 2   Years of education: Not on file   Highest education level: Not on file  Occupational History   Occupation: locksmith  Tobacco Use   Smoking status: Former    Current packs/day: 0.00    Average packs/day: 1.5 packs/day for 40.1 years (60.2 ttl pk-yrs)    Types: Cigarettes    Start date: 12/31/1971    Quit date: 02/10/2012    Years since quitting: 11.9   Smokeless tobacco: Never  Vaping Use   Vaping status: Never Used  Substance and Sexual Activity   Alcohol use: No   Drug use: No   Sexual activity: Never  Other Topics Concern   Not on file  Social History Narrative   Not on file   Social Drivers of Health   Financial Resource Strain: Not on file  Food Insecurity: No Food Insecurity (09/15/2023)   Hunger Vital Sign    Worried About Running Out of Food  in the Last Year: Never true    Ran Out of Food in the Last Year: Never true  Transportation Needs: No Transportation Needs (09/15/2023)   PRAPARE - Administrator, Civil Service (Medical): No    Lack of Transportation (Non-Medical): No  Physical Activity: Not on file  Stress: Not on file  Social Connections: Unknown (05/12/2022)   Received from [facility], [facility]   Social Network    Social Network: Not on file  Intimate Partner Violence: Not At Risk (09/15/2023)   Humiliation, Afraid, Rape, and Kick questionnaire    Fear of Current or Ex-Partner: No    Emotionally Abused: No    Physically Abused: No    Sexually Abused: No    FAMILY HISTORY: Family History  Problem Relation Age of Onset   Heart failure Mother    Crohn's disease Sister    Osteoporosis Sister    Neuropathy Sister    Colon polyps Sister    Lung cancer Sister 103       smoked  Thyroid  disease Sister    Thyroid  cancer Maternal Grandmother    Colon cancer Neg Hx    Breast cancer Neg Hx    Esophageal cancer Neg Hx    Pancreatic cancer Neg Hx    Stomach cancer Neg Hx     ALLERGIES:  is allergic to bupropion, cefaclor, elemental sulfur, sulfa antibiotics, latex, and ofloxacin.  MEDICATIONS:  Current Outpatient Medications  Medication Sig Dispense Refill   albuterol  (VENTOLIN  HFA) 108 (90 Base) MCG/ACT inhaler Inhale 2 puffs into the lungs every 6 (six) hours as needed. 18 g 5   ALPRAZolam (XANAX) 0.25 MG tablet Take 0.25 mg by mouth 3 (three) times daily as needed for anxiety.     aspirin  EC 81 MG tablet Take 81 mg by mouth in the morning. Swallow whole.     cetirizine  (ZYRTEC ) 10 MG tablet Take 1 tablet (10 mg total) by mouth daily. 30 tablet 11   Cholecalciferol (VITAMIN D  PO) Take 5,000 Units by mouth in the morning.     denosumab  (PROLIA ) 60 MG/ML SOSY injection Inject 60 mg into the skin every 6 (six) months.     famotidine  (PEPCID ) 20 MG tablet TAKE 1 TABLET (20 MG TOTAL) BY MOUTH 2  (TWO) TIMES DAILY. AS NEEDED 180 tablet 2   fluticasone  (FLONASE ) 50 MCG/ACT nasal spray Place into both nostrils.     inclisiran (LEQVIO ) 284 MG/1.5ML SOSY injection Inject 284 mg into the skin every 6 (six) months. Every three months     loperamide (IMODIUM) 2 MG capsule Take 2 mg by mouth as needed for diarrhea or loose stools.     promethazine  (PHENERGAN ) 12.5 MG suppository Place 12.5 mg rectally every 6 (six) hours as needed for nausea or vomiting.     sertraline (ZOLOFT) 100 MG tablet Take 50 mg by mouth in the morning.     No current facility-administered medications for this visit.    REVIEW OF SYSTEMS:   Constitutional: Denies fevers, chills or abnormal night sweats Eyes: Denies blurriness of vision, double vision or watery eyes Ears, nose, mouth, throat, and face: Denies mucositis or sore throat Respiratory: Denies cough, dyspnea or wheezes Cardiovascular: Denies palpitation, chest discomfort or lower extremity swelling Gastrointestinal:  Denies nausea, heartburn or change in bowel habits Skin: Denies abnormal skin rashes Lymphatics: Denies new lymphadenopathy or easy bruising Neurological:Denies numbness, tingling or new weaknesses Behavioral/Psych: Mood is stable, no new changes  All other systems were reviewed with the patient and are negative.  PHYSICAL EXAMINATION: ECOG PERFORMANCE STATUS: 0 - Asymptomatic  There were no vitals filed for this visit.   There were no vitals filed for this visit.  PE deferred in lieu of telephone visit.  LABORATORY DATA:  I have reviewed the data as listed Lab Results  Component Value Date   WBC 6.3 12/29/2023   HGB 13.6 12/29/2023   HCT 42.0 12/29/2023   MCV 92.7 12/29/2023   PLT 265 12/29/2023   Lab Results  Component Value Date   NA 141 12/29/2023   K 4.6 12/29/2023   CL 105 12/29/2023   CO2 29 12/29/2023    RADIOGRAPHIC STUDIES: I have personally reviewed the radiological reports and agreed with the findings in the  report.  ASSESSMENT AND PLAN:  Malignant neoplasm of upper-outer quadrant of left breast in female, estrogen receptor negative (HCC) This is a pleasant 73 year old postmenopausal female patient with newly diagnosed left breast T2 N0 triple negative invasive ductal carcinoma referred to breast [facility] for additional recommendations.  Given triple negative biology and size of the tumor over 2 cm, we have discussed about both neoadjuvant and adjuvant approach. I have discussed neoadjuvant chemotherapy options including keynote 522 versus SCARLET trial versus adjuvant approach.  She has decent performance status but is mostly limited by her diarrhea predominant IBS as well as COPD given her prior history of smoking.  After much discussion we have discussed about proceeding with upfront surgery followed by consideration for adjuvant chemotherapy. She understands that the benefit of adjuvant chemo is to reduce the risk of recurrence.  She did not however want to proceed with chemotherapy.   She is here for a follow-up telephone call to review the CT results.  We have reviewed the CT results which shows persistent lung nodules, some of them have increased and some of them have decreased.  Recommendation was to continue follow-up.  With regards to the left breast swelling, explained that there is no mention of any abnormalities on the CT however I wonder if she continues to have posttreatment changes hence have sent a message to Dr. Vanderbilt as well as Dr. Shannon to see if there is any role for aspiration.  She is already undergoing physical therapy.   I have also encouraged her to reach out back to us  if she has any new questions.   I connected with  [name] [name] on 01/07/24 by a telephone application and verified that I am speaking with the correct person using two identifiers.   I discussed the limitations of evaluation and management by telemedicine. The patient expressed understanding and agreed to  proceed.  Time spent: 13 min  All questions were answered. The patient knows to call the clinic with any problems, questions or concerns.    Amber Stalls, MD 01/07/24

## 2024-01-07 ENCOUNTER — Encounter: Payer: Self-pay | Admitting: Pulmonary Disease

## 2024-01-16 ENCOUNTER — Telehealth: Payer: Self-pay | Admitting: *Deleted

## 2024-01-19 ENCOUNTER — Telehealth: Payer: Self-pay | Admitting: *Deleted

## 2024-01-19 DIAGNOSIS — N644 Mastodynia: Secondary | ICD-10-CM

## 2024-01-19 DIAGNOSIS — Z171 Estrogen receptor negative status [ER-]: Secondary | ICD-10-CM

## 2024-01-19 NOTE — Telephone Encounter (Signed)
This RN spoke with pt on 1/17 per her concerns regarding " what to do about this swelling - Dr Al Pimple was to speak with Dr Roselind Messier and [place]".  Informed her per above- ideally need to resume and complete PT for best outcome -   As far

## 2024-01-20 ENCOUNTER — Other Ambulatory Visit: Payer: Self-pay | Admitting: Student

## 2024-01-20 DIAGNOSIS — Z853 Personal history of malignant neoplasm of breast: Secondary | ICD-10-CM

## 2024-01-20 DIAGNOSIS — C50412 Malignant neoplasm of upper-outer quadrant of left female breast: Secondary | ICD-10-CM | POA: Diagnosis not present

## 2024-01-20 DIAGNOSIS — Z171 Estrogen receptor negative status [ER-]: Secondary | ICD-10-CM | POA: Diagnosis not present

## 2024-01-20 DIAGNOSIS — Z9889 Other specified postprocedural states: Secondary | ICD-10-CM | POA: Diagnosis not present

## 2024-01-20 DIAGNOSIS — N6489 Other specified disorders of breast: Secondary | ICD-10-CM

## 2024-01-21 ENCOUNTER — Encounter: Payer: Self-pay | Admitting: Pulmonary Disease

## 2024-01-22 ENCOUNTER — Other Ambulatory Visit: Payer: Medicare Other

## 2024-01-22 ENCOUNTER — Ambulatory Visit
Admission: RE | Admit: 2024-01-22 | Discharge: 2024-01-22 | Disposition: A | Discharge status: Home / Self Care | Payer: Medicare Other | Source: Ambulatory Visit | Attending: Student | Admitting: Student

## 2024-01-22 ENCOUNTER — Encounter: Payer: Self-pay | Admitting: Pulmonary Disease

## 2024-01-22 DIAGNOSIS — N6489 Other specified disorders of breast: Secondary | ICD-10-CM

## 2024-01-26 DIAGNOSIS — Z961 Presence of intraocular lens: Secondary | ICD-10-CM | POA: Diagnosis not present

## 2024-01-28 ENCOUNTER — Telehealth: Payer: Self-pay | Admitting: *Deleted

## 2024-01-28 ENCOUNTER — Encounter: Payer: Self-pay | Admitting: Pulmonary Disease

## 2024-01-28 NOTE — Telephone Encounter (Signed)
No entry

## 2024-01-28 NOTE — Telephone Encounter (Signed)
This RN spoke with pt per her VM stating she had seroma in her left breast drained last Friday.  " But almost immediately it seemed the fluid returned and the pain is continuing."  " If I touch my breast or my arm rubs against it - it is just awful" " This has been going on since July and I just do not think I should have to live with this kind of discomfort"  Per phone inquiries-  Besides pain, pt states breast is hot to touch.  She wears her compression bra during the day but not at night.  She has not been using any cool compresses " I wasn't sure if I should use heat or cool "  This RN discussed above issue with recommendation for cool compresses, Informed need for further follow up with the surgeon for possible interventions as well as need to be seen by PT for benefit and possible additional recommendations. This RN did discuss issue with the seroma does not have a "quick fix" and has to be managed over time.  Note Anola main issue is "having to deal with this pain."  This note will be forwarded to the surgeon's office for follow up of above and concern for possible need for antibiotic .  Of note due to PT - Sharl Ma being in the office today for [facility] this RN reviewed above with her as MD- referral being placed to PT.

## 2024-01-28 NOTE — Telephone Encounter (Signed)
Per surgeon's office - no present need for antibiotic per prior assessment.  Recommended proceeding with PT as proceeding with further aspirations likely non beneficial and would put pt at risk for infection.  Referral for PT has been entered.  This RN called and discussed above with pt with verbalized understanding of plan.

## 2024-02-02 ENCOUNTER — Ambulatory Visit: Payer: Medicare Other

## 2024-02-03 NOTE — Therapy (Addendum)
 OUTPATIENT PHYSICAL THERAPY  UPPER EXTREMITY ONCOLOGY EVALUATION  Patient Name: [name] [name] MRN: [id number] DOB:October 31, 1951, 73 y.o., female Today's Date: 02/13/2024  END OF SESSION: Visit number 1 Number of visits 12 Date for Re-eval 03/17/2024 PT start time 1104 PT Stop time; 1205 PT time  61 min Activity tolerance; pt tolerated treatment well Behavior during PT: WFL for tasks assessed   Past Medical History:  Diagnosis Date   Adenomatous colon polyp    Allergy    Anxiety    Arthritis    bilateral hands. lower back   Cancer [facility])    skin   Cataract    bilateral   COPD (chronic obstructive pulmonary disease) (HCC)    Depression    Diverticulosis    Endometriosis    GERD (gastroesophageal reflux disease)    Glaucoma    Headache    Migraines   Hiatal hernia    History of left breast cancer 05/2023   History of radiation therapy    Left breast- 09/25/23-10/17/23- Dr. Lynwood Nasuti   IBS (irritable bowel syndrome)    Inguinal hernia    Pneumothorax 1987   PONV (postoperative nausea and vomiting)    Past Surgical History:  Procedure Laterality Date   ABDOMINAL HYSTERECTOMY  1992   ANTERIOR CERVICAL DECOMP/DISCECTOMY FUSION N/A 09/23/2022   Procedure: Anterior Decompression Fusion,PLATE/SCREWS Cervical five-six; REMOVAL OLD PLATE;  Surgeon: Mavis Purchase, MD;  Location: [facility] OR;  Service: Neurosurgery;  Laterality: N/A;   APPENDECTOMY  1992   BACK SURGERY     cervical   BREAST BIOPSY Left 06/23/2023   US  LT BREAST BX W LOC DEV 1ST LESION IMG BX SPEC US  GUIDE 06/23/2023 [facility]   BREAST BIOPSY Left 07/28/2023   US  LT RADIOACTIVE SEED LOC 07/28/2023 [facility]   BREAST LUMPECTOMY WITH RADIOACTIVE SEED AND SENTINEL LYMPH NODE BIOPSY Left 07/29/2023   Procedure: LEFT BREAST SEED LUMPECTOMY WITH LEFT SENTINEL LYMPH NODE MAPPING;  Surgeon: Vanderbilt Ned, MD;  Location: [facility] OR;  Service: General;  Laterality: Left;  PEC BLOCK   CATARACT EXTRACTION  Bilateral    2021, 2022   COLONOSCOPY  2023   PLEURAL SCARIFICATION     ruptured disk     torn tendon     right arm   TUBAL LIGATION     Patient Active Problem List   Diagnosis Date Noted   Genetic testing 07/11/2023   Malignant neoplasm of upper-outer quadrant of left breast in female, estrogen receptor negative (HCC) 06/30/2023   Cervical spondylosis with radiculopathy 09/23/2022   Elevated low-density lipoprotein level 06/14/2022   Coronary arteriosclerosis 06/14/2022   Adverse reaction to antihyperlipidemic drug, initial encounter 06/14/2022   Diarrhea 01/27/2019   Change in bowel habits 01/05/2019   Left inguinal hernia 06/27/2014   Bloating 05/17/2013   Constipation 05/17/2013   Pneumothorax 10/17/2008   HIATAL HERNIA 10/17/2008   DIVERTICULOSIS, COLON 10/17/2008   ENDOMETRIOSIS 10/17/2008   Right lower quadrant abdominal pain 10/17/2008   DEPRESSION, HX OF 10/17/2008   History of colonic polyps 10/17/2008   PNEUMOTHORAX 10/17/2008    PCP: Dr Clarice   REFERRING PROVIDER: Amber Stalls, MD  REFERRING DIAG: Left Breast Cancer  THERAPY DIAG:  Malignant neoplasm of upper-outer quadrant of left breast in female, estrogen receptor negative (HCC) - Plan: PT plan of care cert/re-cert  Breast pain - Plan: PT plan of care cert/re-cert  Lymphedema, not elsewhere classified - Plan: PT plan of care cert/re-cert  ONSET DATE: 1 year ago  Rationale for  Evaluation and Treatment: Rehabilitation  SUBJECTIVE:                                                                                                                                                                                           SUBJECTIVE STATEMENT:  I have been complaining of swelling since last year. Left hand, arm, lateral neck, and the lateral breast/armpit hurts.  She is wearing a compression bra but has to take it off by the end of the day because it hurts all over because it is too tight. I have a  compression sleeve for the arm and hand but I don't wear them because they are too tight. I can't wear my wedding ring anymore. I wore the sleeve consistently for a while, but not in the last couple of months because it is too tight. She is unable to lay on the left side at night  PERTINENT HISTORY:  Patient was diagnosed on 08/15/2023 with left grade 3 invasive ductal carcinoma breast cancer. She underwent a left lumpectomy and sentinel node biopsy (13 negative nodes) on 07/29/2023. It is triple negative with a Ki67 of 95%. She has a history of a cervical fusion in 08/2022. She ended radiation in October 2024. She had a breast seroma aspirated of 10 cc on 01/22/2024 . She has had prior PT treatment for cording and left UE/breast swelling  PAIN:  Are you having pain? Yes NPRS scale: 2/10 best, to 8/10 at worst/10 Pain location: left lateral breast Pain orientation: Left and Lateral  PAIN TYPE: burning and rushing, pressure  Aggravating factors: laying on left side,arm rubbing side, touching it,raising arm Relieving factors: propping her arm, advil  PRECAUTIONS:  L UE lymphedema risk, cervical fusion C6, C7   RED FLAGS: Compression fracture: Yes: T12    WEIGHT BEARING RESTRICTIONS: No  FALLS:  Has patient fallen in last 6 months? No  LIVING ENVIRONMENT: Lives with: lives with their spouse Lives in: House/apartment Stairs: Yes; External: 5 steps; can reach both Has following equipment at home: None  OCCUPATION: locksmith  LEISURE: play cards,   HAND DOMINANCE: right   PRIOR LEVEL OF FUNCTION: Independent  PATIENT GOALS: Get rid of pain and swelling   OBJECTIVE: Note: Objective measures were completed at Evaluation unless otherwise noted.  COGNITION: Overall cognitive status: Within functional limits for tasks assessed   PALPATION: Tender lateral breast, inferior breast,axilla  OBSERVATIONS / OTHER ASSESSMENTS: generalized swelling,mild redness left breast. Nipple red and  irritated looking, significant indentation on left from compression bra  SENSATION: Light touch: Deficits      POSTURE: forward head, rounded shoulders  UPPER EXTREMITY AROM/PROM:  A/PROM RIGHT   eval   Shoulder extension   Shoulder flexion 155,   Shoulder abduction 165,  Shoulder internal rotation   Shoulder external rotation     (Blank rows = not tested)  A/PROM LEFT   eval  Shoulder extension   Shoulder flexion 153  Shoulder abduction 162  Shoulder internal rotation   Shoulder external rotation     (Blank rows = not tested)  CERVICAL AROM: All within functional limits:     UPPER EXTREMITY STRENGTH:   LYMPHEDEMA ASSESSMENTS:   SURGERY TYPE/DATE: 07/29/23 L lumpectomy and SLNB   NUMBER OF LYMPH NODES REMOVED: 0+/13  CHEMOTHERAPY: no pt declined  RADIATION:YES, completed Oct 2024  HORMONE TREATMENT: None  INFECTIONS: none   LYMPHEDEMA ASSESSMENTS:   LANDMARK RIGHT  eval  At axilla  33.4  15 cm proximal to olecranon process 31.3  10 cm proximal to olecranon process 29.5  Olecranon process 26.4  15 cm proximal to ulnar styloid process 24.2  10 cm proximal to ulnar styloid process 21.0  Just proximal to ulnar styloid process 16.8  Across hand at thumb web space 19.8  At base of 2nd digit 6.4  (Blank rows = not tested)  LANDMARK LEFT  eval  At axilla  33.2  15 cm proximal to olecranon process 32.2  10 cm proximal to olecranon process 30.5  Olecranon process 27.9  15 cm proximal to ulnar styloid process 24.5  10 cm proximal to ulnar styloid process 21.5  Just proximal to ulnar styloid process 18.65  Across hand at thumb web space 21.0  At base of 2nd digit 6.35  (Blank rows = not tested)   FUNCTIONAL TESTS:    GAIT:WNL   L-DEX LYMPHEDEMA SCREENING: The patient was assessed using the L-Dex machine today to produce a lymphedema index baseline score. The patient will be reassessed on a regular basis (typically every 3 months) to obtain  new L-Dex scores. If the score is > 6.5 points away from his/her baseline score indicating onset of subclinical lymphedema, it will be recommended to wear a compression garment for 4 weeks, 12 hours per day and then be reassessed. If the score continues to be > 6.5 points from baseline at reassessment, we will initiate lymphedema treatment. Assessing in this manner has a 95% rate of preventing clinically significant lymphedema.  QUICK DASH SURVEY: 48%    BREAST COMPLAINTS QUESTIONNAIRE Pain:8 Heaviness:10 Swollen feeling:10 Tense Skin:8 Redness:3 Bra Print:8 Size of Pores:5 Hard feeling: 8 Total:    60 /80 A Score over 9 indicates lymphedema issues in the breast                                                                                                                         TREATMENT DATE:  02/04/2024 Discussed POC, LOS, treatment interventions. Had Blaire also check redness at breast;agrees no infection. Gave script for pt to look into getting a different compression bra at [facility] that might fit better.  Pt to bring arm and hand garments next visit.    PATIENT EDUCATION:  Education details:POC, LOS, treatment interventions. Had Blaire also check redness at breast;agrees no infection. Gave script for pt to look into getting a different compression bra at [facility]. Eward also spoke to her about a Teacher, Early Years/pre. Person educated: Patient Education method: Explanation Education comprehension: verbalized understanding  HOME EXERCISE PROGRAM:   ASSESSMENT:  CLINICAL IMPRESSION: Patient is a 73 y.o. female who was seen today for physical therapy evaluation and treatment for complaints of Left breast pain/swelling and left UE hand and arm swelling and supraclavicular swelling. She is s/p recent  aspiration of 10 cc from a small left breast seroma . She has a considerable amount of pain and generalized swelling in the left breast. Her compression bra is very uncomfortable and  leaves large indentations on her breast. She has a sleeve and glove that she feels don't fit well and would like to consider other options. Her left arm is swollen with approx 1 cm difference throughout. She will benefit from skilled PT to address breast pain and swelling, and arm swelling to allow pt to return to normal activities. .  OBJECTIVE IMPAIRMENTS: decreased activity tolerance, decreased knowledge of condition, increased edema, impaired UE functional use, postural dysfunction, and pain.   ACTIVITY LIMITATIONS: sleeping, bed mobility, and reach over head  PARTICIPATION LIMITATIONS:  anything requiring use of her arm to reach in certain directions putting pressure on breast  PERSONAL FACTORS: 1-2 comorbidities: Left triple negative breast cancer s/p radiation  are also affecting patient's functional outcome.   REHAB POTENTIAL: Good  CLINICAL DECISION MAKING: Evolving/moderate complexity  EVALUATION COMPLEXITY: Moderate  GOALS: Goals reviewed with patient? Yes  SHORT TERM GOALS Target date: 02/25/2024   1.  Pt will have decreased Left breast pain by 25%% Baseline:  Goal status: INITIAL  2.  Pt will have decreased breast complaints questionnaire score to 40/80 Baseline:  Goal status: INITIAL  3.  Pt will be compliant with compression bra/vest to decrease swelling Baseline:  Goal status: INITIAL  4.  Pt will have new compression garments for arm/hand prn Baseline:  Goal status: INITIAL  LONG TERM GOALS: Target date: 03/17/2024  Pt will have decreased breast pain by 50% for improved activity tolerance Baseline:  Goal status: INITIAL  2.  Pt will be independent with MLD to left breast and UE prn to decrease swelling Baseline:  Goal status: INITIAL  3.  Breast complaints questionnaire will be decreased to no greater than 25 to demonstrate improved swelling Baseline:  Goal status: INITIAL  4.  Pt will be able to sleep on her left side without left breast  pain Baseline:  Goal status: INITIAL  5.  Pt will be tolerant of UE compression garments and will note decreased edema throughout Baseline:  Goal status: INITIAL    PLAN:  PT FREQUENCY: 2x/week  PT DURATION: 6 weeks  PLANNED INTERVENTIONS: 97164- PT Re-evaluation, 97110-Therapeutic exercises, 97530- Therapeutic activity, 97112- Neuromuscular re-education, 97535- Self Care, [id number]- Manual therapy, and 97760- Orthotic Fit/training  PLAN FOR NEXT SESSION: SOZO, check present arm garments, order new prn, did she get new compression bra? Will foam help at axilla?, initiate breast MLD, UE MLD,   Grayce JINNY Sheldon, PT 02/13/2024, 1:12 PMzz

## 2024-02-04 ENCOUNTER — Encounter: Payer: Self-pay | Admitting: Pulmonary Disease

## 2024-02-04 ENCOUNTER — Ambulatory Visit: Payer: Medicare Other | Attending: Hematology and Oncology

## 2024-02-04 ENCOUNTER — Other Ambulatory Visit: Payer: Self-pay

## 2024-02-04 DIAGNOSIS — C50412 Malignant neoplasm of upper-outer quadrant of left female breast: Secondary | ICD-10-CM | POA: Insufficient documentation

## 2024-02-04 DIAGNOSIS — Z171 Estrogen receptor negative status [ER-]: Secondary | ICD-10-CM | POA: Diagnosis not present

## 2024-02-04 DIAGNOSIS — I890 Lymphedema, not elsewhere classified: Secondary | ICD-10-CM | POA: Insufficient documentation

## 2024-02-04 DIAGNOSIS — N644 Mastodynia: Secondary | ICD-10-CM | POA: Insufficient documentation

## 2024-02-09 ENCOUNTER — Ambulatory Visit: Payer: Self-pay

## 2024-02-09 DIAGNOSIS — C50412 Malignant neoplasm of upper-outer quadrant of left female breast: Secondary | ICD-10-CM | POA: Diagnosis not present

## 2024-02-10 DIAGNOSIS — M545 Low back pain, unspecified: Secondary | ICD-10-CM | POA: Diagnosis not present

## 2024-02-10 DIAGNOSIS — M25552 Pain in left hip: Secondary | ICD-10-CM | POA: Diagnosis not present

## 2024-02-11 ENCOUNTER — Ambulatory Visit: Payer: Medicare Other | Admitting: Physical Therapy

## 2024-02-11 ENCOUNTER — Encounter: Payer: Self-pay | Admitting: Physical Therapy

## 2024-02-11 DIAGNOSIS — N644 Mastodynia: Secondary | ICD-10-CM | POA: Diagnosis not present

## 2024-02-11 DIAGNOSIS — Z171 Estrogen receptor negative status [ER-]: Secondary | ICD-10-CM

## 2024-02-11 DIAGNOSIS — R293 Abnormal posture: Secondary | ICD-10-CM

## 2024-02-11 DIAGNOSIS — I890 Lymphedema, not elsewhere classified: Secondary | ICD-10-CM | POA: Diagnosis not present

## 2024-02-11 DIAGNOSIS — C50412 Malignant neoplasm of upper-outer quadrant of left female breast: Secondary | ICD-10-CM | POA: Diagnosis not present

## 2024-02-11 NOTE — Therapy (Signed)
OUTPATIENT PHYSICAL THERAPY  UPPER EXTREMITY ONCOLOGY EVALUATION  Patient Name: [name] [name] MRN: [id number] DOB:[birth year], 73 y.o., female Today's Date: 02/11/2024  END OF SESSION:  PT End of Session - 02/11/24 1154     Visit Number 2    Number of Visits 12    Date for PT Re-Evaluation 03/17/24    PT Start Time 1104    PT Stop Time 1154    PT Time Calculation (min) 50 min    Activity Tolerance Patient tolerated treatment well    Behavior During Therapy [facility] for tasks assessed/performed             Past Medical History:  Diagnosis Date   Adenomatous colon polyp    Allergy    Anxiety    Arthritis    bilateral hands. lower back   Cancer [facility])    skin   Cataract    bilateral   COPD (chronic obstructive pulmonary disease) (HCC)    Depression    Diverticulosis    Endometriosis    GERD (gastroesophageal reflux disease)    Glaucoma    Headache    Migraines   Hiatal hernia    History of left breast cancer 05/2023   History of radiation therapy    Left breast- 09/25/23-10/17/23- Dr. Antony Blackbird   IBS (irritable bowel syndrome)    Inguinal hernia    Pneumothorax 1987   PONV (postoperative nausea and vomiting)    Past Surgical History:  Procedure Laterality Date   ABDOMINAL HYSTERECTOMY  1992   ANTERIOR CERVICAL DECOMP/DISCECTOMY FUSION N/A 09/23/2022   Procedure: Anterior Decompression Fusion,PLATE/SCREWS Cervical five-six; REMOVAL OLD PLATE;  Surgeon: Tressie Stalker, MD;  Location: [facility] OR;  Service: Neurosurgery;  Laterality: N/A;   APPENDECTOMY  1992   BACK SURGERY     cervical   BREAST BIOPSY Left 06/23/2023   [place] LT BREAST BX W LOC DEV 1ST LESION IMG BX SPEC US GUIDE 06/23/2023 [facility]   BREAST BIOPSY Left 07/28/2023   [place] LT RADIOACTIVE SEED LOC 07/28/2023 [facility]   BREAST LUMPECTOMY WITH RADIOACTIVE SEED AND SENTINEL LYMPH NODE BIOPSY Left 07/29/2023   Procedure: LEFT BREAST SEED LUMPECTOMY WITH LEFT SENTINEL LYMPH NODE MAPPING;   Surgeon: Harriette Bouillon, MD;  Location: [facility] OR;  Service: General;  Laterality: Left;  PEC BLOCK   CATARACT EXTRACTION Bilateral    2021, 2022   COLONOSCOPY  2023   PLEURAL SCARIFICATION     ruptured disk     torn tendon     right arm   TUBAL LIGATION     Patient Active Problem List   Diagnosis Date Noted   Genetic testing 07/11/2023   Malignant neoplasm of upper-outer quadrant of left breast in female, estrogen receptor negative (HCC) 06/30/2023   Cervical spondylosis with radiculopathy 09/23/2022   Elevated low-density lipoprotein level 06/14/2022   Coronary arteriosclerosis 06/14/2022   Adverse reaction to antihyperlipidemic drug, initial encounter 06/14/2022   Diarrhea 01/27/2019   Change in bowel habits 01/05/2019   Left inguinal hernia 06/27/2014   Bloating 05/17/2013   Constipation 05/17/2013   Pneumothorax 10/17/2008   HIATAL HERNIA 10/17/2008   DIVERTICULOSIS, COLON 10/17/2008   ENDOMETRIOSIS 10/17/2008   Right lower quadrant abdominal pain 10/17/2008   DEPRESSION, HX OF 10/17/2008   History of colonic polyps 10/17/2008   PNEUMOTHORAX 10/17/2008    PCP: Dr Renne Crigler   REFERRING PROVIDER: Rachel Moulds, MD  REFERRING DIAG: Left Breast Cancer  THERAPY DIAG:  Lymphedema, not elsewhere classified  Breast pain  Abnormal posture  Malignant neoplasm of upper-outer quadrant of left breast in female, estrogen receptor negative (HCC)  ONSET DATE: 1 year ago  Rationale for Evaluation and Treatment: Rehabilitation  SUBJECTIVE:                                                                                                                                                                                           SUBJECTIVE STATEMENT:  I am still so swollen.   PERTINENT HISTORY:  Patient was diagnosed on 08/15/2023 with left grade 3 invasive ductal carcinoma breast cancer. She underwent a left lumpectomy and sentinel node biopsy (13 negative nodes) on 07/29/2023. It is  triple negative with a Ki67 of 95%. She has a history of a cervical fusion in 08/2022. She ended radiation in October 2024. She had a breast seroma aspirated of 10 cc on 01/22/2024 . She has had prior PT treatment for cording and left UE/breast swelling  PAIN:  Are you having pain? Yes NPRS scale: 9/10 Pain location: left lateral breast Pain orientation: Left and Lateral  PAIN TYPE: burning and rushing, pressure  Aggravating factors: laying on left side,arm rubbing side, touching it,raising arm Relieving factors: propping her arm, advil  PRECAUTIONS:  L UE lymphedema risk, cervical fusion C6, C7   RED FLAGS: Compression fracture: Yes: T12    WEIGHT BEARING RESTRICTIONS: No  FALLS:  Has patient fallen in last 6 months? No  LIVING ENVIRONMENT: Lives with: lives with their spouse Lives in: House/apartment Stairs: Yes; External: 5 steps; can reach both Has following equipment at home: None  OCCUPATION: locksmith  LEISURE: play cards,   HAND DOMINANCE: right   PRIOR LEVEL OF FUNCTION: Independent  PATIENT GOALS: Get rid of pain and swelling   OBJECTIVE: Note: Objective measures were completed at Evaluation unless otherwise noted.  COGNITION: Overall cognitive status: Within functional limits for tasks assessed   PALPATION: Tender lateral breast, inferior breast,axilla  OBSERVATIONS / OTHER ASSESSMENTS: generalized swelling,mild redness left breast. Nipple red and irritated looking, significant indentation on left from compression bra  SENSATION: Light touch: Deficits      POSTURE: forward head, rounded shoulders  UPPER EXTREMITY AROM/PROM:  A/PROM RIGHT   eval   Shoulder extension   Shoulder flexion 155,   Shoulder abduction 165,  Shoulder internal rotation   Shoulder external rotation     (Blank rows = not tested)  A/PROM LEFT   eval  Shoulder extension   Shoulder flexion 153  Shoulder abduction 162  Shoulder internal rotation   Shoulder external  rotation     (Blank rows = not tested)  CERVICAL AROM: All within functional  limits:     UPPER EXTREMITY STRENGTH:   LYMPHEDEMA ASSESSMENTS:   SURGERY TYPE/DATE: 07/29/23 L lumpectomy and SLNB   NUMBER OF LYMPH NODES REMOVED: 0+/13  CHEMOTHERAPY: no pt declined  RADIATION:YES, completed Oct 2024  HORMONE TREATMENT: None  INFECTIONS: none   LYMPHEDEMA ASSESSMENTS:   LANDMARK RIGHT  eval  At axilla  33.4  15 cm proximal to olecranon process 31.3  10 cm proximal to olecranon process 29.5  Olecranon process 26.4  15 cm proximal to ulnar styloid process 24.2  10 cm proximal to ulnar styloid process 21.0  Just proximal to ulnar styloid process 16.8  Across hand at thumb web space 19.8  At base of 2nd digit 6.4  (Blank rows = not tested)  LANDMARK LEFT  eval  At axilla  33.2  15 cm proximal to olecranon process 32.2  10 cm proximal to olecranon process 30.5  Olecranon process 27.9  15 cm proximal to ulnar styloid process 24.5  10 cm proximal to ulnar styloid process 21.5  Just proximal to ulnar styloid process 18.65  Across hand at thumb web space 21.0  At base of 2nd digit 6.35  (Blank rows = not tested)   FUNCTIONAL TESTS:    GAIT:WNL   L-DEX LYMPHEDEMA SCREENING: The patient was assessed using the L-Dex machine today to produce a lymphedema index baseline score. The patient will be reassessed on a regular basis (typically every 3 months) to obtain new L-Dex scores. If the score is > 6.5 points away from his/her baseline score indicating onset of subclinical lymphedema, it will be recommended to wear a compression garment for 4 weeks, 12 hours per day and then be reassessed. If the score continues to be > 6.5 points from baseline at reassessment, we will initiate lymphedema treatment. Assessing in this manner has a 95% rate of preventing clinically significant lymphedema.  QUICK DASH SURVEY: 48%    BREAST COMPLAINTS  QUESTIONNAIRE Pain:8 Heaviness:10 Swollen feeling:10 Tense Skin:8 Redness:3 Bra Print:8 Size of Pores:5 Hard feeling: 8 Total:    60 /80 A Score over 9 indicates lymphedema issues in the breast  L-DEX FLOWSHEETS - 02/11/24 1100       L-DEX LYMPHEDEMA SCREENING   Measurement Type Unilateral    L-DEX MEASUREMENT EXTREMITY Upper Extremity    POSITION  Standing    DOMINANT SIDE Right    At Risk Side Left    BASELINE SCORE (UNILATERAL) 4.5    L-DEX SCORE (UNILATERAL) 14.8    VALUE CHANGE (UNILAT) 10.3            The patient was assessed using the L-Dex machine today to produce a lymphedema index baseline score. The patient will be reassessed on a regular basis (typically every 3 months) to obtain new L-Dex scores. If the score is > 6.5 points away from his/her baseline score indicating onset of subclinical lymphedema, it will be recommended to wear a compression garment for 4 weeks, 12 hours per day and then be reassessed. If the score continues to be > 6.5 points from baseline at reassessment, we will initiate lymphedema treatment. Assessing in this manner has a 95% rate of preventing clinically significant lymphedema.  TREATMENT DATE:  02/11/24: In supine: Short neck, 5 diaphragmatic breaths, R axillary nodes and establishment of interaxillary pathway, L inguinal nodes and establishment of axilloinguinal pathway, then L breast moving fluid towards pathways spending extra time in any areas of fibrosis then retracing all steps then LUE working proximal to distal and retracing all steps.  Gentle soft tissue mobilization to L lumpectomy scar At end of session pt was able to palpate a vertical cord near axillary/breast fold where pt has increased pain so began gentle MFR to this area  02/04/2024 Discussed POC, LOS, treatment interventions. Had Mirren Gest also check redness at  breast;agrees no infection. Gave script for pt to look into getting a different compression bra at [facility] [place] that might fit better. Pt to bring arm and hand garments next visit.    PATIENT EDUCATION:  Education details:POC, LOS, treatment interventions. Had Minka Knight also check redness at breast;agrees no infection. Gave script for pt to look into getting a different compression bra at [facility] [place]. Dorothyann Gibbs also spoke to her about a Teacher, early years/pre. Person educated: Patient Education method: Explanation Education comprehension: verbalized understanding  HOME EXERCISE PROGRAM:   ASSESSMENT:  CLINICAL IMPRESSION: Focused on breast and L UE MLD to decrease edema. Repeated SOZO and pt was in the red. She has ordered a new compression sleeve, glove and compression bra. She has been wearing her compression sleeve. At end of session, therapist was able to palpate a vertical cord at lateral breast near axillary fold where pt was having increased pain and began gentle MFR. This is most likely the culprit for the ongoing pain and sensitivity she has been having.  .  OBJECTIVE IMPAIRMENTS: decreased activity tolerance, decreased knowledge of condition, increased edema, impaired UE functional use, postural dysfunction, and pain.   ACTIVITY LIMITATIONS: sleeping, bed mobility, and reach over head  PARTICIPATION LIMITATIONS:  anything requiring use of her arm to reach in certain directions putting pressure on breast  PERSONAL FACTORS: 1-2 comorbidities: Left triple negative breast cancer s/p radiation  are also affecting patient's functional outcome.   REHAB POTENTIAL: Good  CLINICAL DECISION MAKING: Evolving/moderate complexity  EVALUATION COMPLEXITY: Moderate  GOALS: Goals reviewed with patient? Yes  SHORT TERM GOALS Target date: 02/25/2024   1.  Pt will have decreased Left breast pain by 25%% Baseline:  Goal status: INITIAL  2.  Pt will have decreased breast complaints questionnaire score  to 40/80 Baseline:  Goal status: INITIAL  3.  Pt will be compliant with compression bra/vest to decrease swelling Baseline:  Goal status: INITIAL  4.  Pt will have new compression garments for arm/hand prn Baseline:  Goal status: INITIAL  LONG TERM GOALS: Target date: 03/17/2024  Pt will have decreased breast pain by 50% for improved activity tolerance Baseline:  Goal status: INITIAL  2.  Pt will be independent with MLD to left breast and UE prn to decrease swelling Baseline:  Goal status: INITIAL  3.  Breast complaints questionnaire will be decreased to no greater than 25 to demonstrate improved swelling Baseline:  Goal status: INITIAL  4.  Pt will be able to sleep on her left side without left breast pain Baseline:  Goal status: INITIAL  5.  Pt will be tolerant of UE compression garments and will note decreased edema throughout Baseline:  Goal status: INITIAL    PLAN:  PT FREQUENCY: 2x/week  PT DURATION: 6 weeks  PLANNED INTERVENTIONS: 97164- PT Re-evaluation, 97110-Therapeutic exercises, 97530- Therapeutic activity, [id number]- Neuromuscular re-education, 97535- Self Care, [id number]-  Manual therapy, and [id number]- Orthotic Fit/training  PLAN FOR NEXT SESSION: MFR to breast cord and axillary cording, check present arm garments, order new prn, did she get new compression bra? Will foam help at axilla?, initiate breast MLD, UE MLD,   [facility], PT 02/11/2024, 12:00 PM

## 2024-02-12 ENCOUNTER — Ambulatory Visit: Payer: Self-pay | Admitting: Rehabilitation

## 2024-02-13 NOTE — Addendum Note (Signed)
Addended by: Waynette Buttery on: 02/13/2024 01:31 PM   Modules accepted: Orders

## 2024-02-16 ENCOUNTER — Encounter: Payer: Self-pay | Admitting: Rehabilitation

## 2024-02-16 ENCOUNTER — Ambulatory Visit: Payer: Medicare Other | Admitting: Rehabilitation

## 2024-02-16 DIAGNOSIS — Z483 Aftercare following surgery for neoplasm: Secondary | ICD-10-CM

## 2024-02-16 DIAGNOSIS — I890 Lymphedema, not elsewhere classified: Secondary | ICD-10-CM | POA: Diagnosis not present

## 2024-02-16 DIAGNOSIS — N644 Mastodynia: Secondary | ICD-10-CM | POA: Diagnosis not present

## 2024-02-16 DIAGNOSIS — R293 Abnormal posture: Secondary | ICD-10-CM

## 2024-02-16 DIAGNOSIS — M810 Age-related osteoporosis without current pathological fracture: Secondary | ICD-10-CM | POA: Diagnosis not present

## 2024-02-16 DIAGNOSIS — Z171 Estrogen receptor negative status [ER-]: Secondary | ICD-10-CM | POA: Diagnosis not present

## 2024-02-16 DIAGNOSIS — M25612 Stiffness of left shoulder, not elsewhere classified: Secondary | ICD-10-CM

## 2024-02-16 DIAGNOSIS — C50412 Malignant neoplasm of upper-outer quadrant of left female breast: Secondary | ICD-10-CM | POA: Diagnosis not present

## 2024-02-16 NOTE — Therapy (Signed)
OUTPATIENT PHYSICAL THERAPY  UPPER EXTREMITY ONCOLOGY  Patient Name: [name] [name] MRN: [id number] DOB:1951/09/01, 73 y.o., female Today's Date: 02/16/2024  END OF SESSION:  PT End of Session - 02/16/24 1147     Visit Number 3    Number of Visits 12    Date for PT Re-Evaluation 03/17/24    PT Start Time 1103    PT Stop Time 1148    PT Time Calculation (min) 45 min    Activity Tolerance Patient tolerated treatment well    Behavior During Therapy WFL for tasks assessed/performed              Past Medical History:  Diagnosis Date   Adenomatous colon polyp    Allergy    Anxiety    Arthritis    bilateral hands. lower back   Cancer [facility])    skin   Cataract    bilateral   COPD (chronic obstructive pulmonary disease) (HCC)    Depression    Diverticulosis    Endometriosis    GERD (gastroesophageal reflux disease)    Glaucoma    Headache    Migraines   Hiatal hernia    History of left breast cancer 05/2023   History of radiation therapy    Left breast- 09/25/23-10/17/23- Dr. Antony Blackbird   IBS (irritable bowel syndrome)    Inguinal hernia    Pneumothorax 1987   PONV (postoperative nausea and vomiting)    Past Surgical History:  Procedure Laterality Date   ABDOMINAL HYSTERECTOMY  1992   ANTERIOR CERVICAL DECOMP/DISCECTOMY FUSION N/A 09/23/2022   Procedure: Anterior Decompression Fusion,PLATE/SCREWS Cervical five-six; REMOVAL OLD PLATE;  Surgeon: Tressie Stalker, MD;  Location: [facility] OR;  Service: Neurosurgery;  Laterality: N/A;   APPENDECTOMY  1992   BACK SURGERY     cervical   BREAST BIOPSY Left 06/23/2023   [place] LT BREAST BX W LOC DEV 1ST LESION IMG BX SPEC US GUIDE 06/23/2023 [facility]   BREAST BIOPSY Left 07/28/2023   [place] LT RADIOACTIVE SEED LOC 07/28/2023 [facility]   BREAST LUMPECTOMY WITH RADIOACTIVE SEED AND SENTINEL LYMPH NODE BIOPSY Left 07/29/2023   Procedure: LEFT BREAST SEED LUMPECTOMY WITH LEFT SENTINEL LYMPH NODE MAPPING;  Surgeon:  Harriette Bouillon, MD;  Location: [facility] OR;  Service: General;  Laterality: Left;  PEC BLOCK   CATARACT EXTRACTION Bilateral    2021, 2022   COLONOSCOPY  2023   PLEURAL SCARIFICATION     ruptured disk     torn tendon     right arm   TUBAL LIGATION     Patient Active Problem List   Diagnosis Date Noted   Genetic testing 07/11/2023   Malignant neoplasm of upper-outer quadrant of left breast in female, estrogen receptor negative (HCC) 06/30/2023   Cervical spondylosis with radiculopathy 09/23/2022   Elevated low-density lipoprotein level 06/14/2022   Coronary arteriosclerosis 06/14/2022   Adverse reaction to antihyperlipidemic drug, initial encounter 06/14/2022   Diarrhea 01/27/2019   Change in bowel habits 01/05/2019   Left inguinal hernia 06/27/2014   Bloating 05/17/2013   Constipation 05/17/2013   Pneumothorax 10/17/2008   HIATAL HERNIA 10/17/2008   DIVERTICULOSIS, COLON 10/17/2008   ENDOMETRIOSIS 10/17/2008   Right lower quadrant abdominal pain 10/17/2008   DEPRESSION, HX OF 10/17/2008   History of colonic polyps 10/17/2008   PNEUMOTHORAX 10/17/2008    PCP: Dr Renne Crigler   REFERRING PROVIDER: Rachel Moulds, MD  REFERRING DIAG: Left Breast Cancer  THERAPY DIAG:  Lymphedema, not elsewhere classified  Breast pain  Malignant neoplasm of upper-outer quadrant of left breast in female, estrogen receptor negative (HCC)  Aftercare following surgery for neoplasm  Abnormal posture  Stiffness of left shoulder, not elsewhere classified  ONSET DATE: 1 year ago  Rationale for Evaluation and Treatment: Rehabilitation  SUBJECTIVE:                                                                                                                                                                                           SUBJECTIVE STATEMENT:  I got my sleeve about a week ago.  It seems like it fits better.  But I do get sharp pain starting in the afternoon and I have to take it off.  The  bra is fine to keep on.    PERTINENT HISTORY:  Patient was diagnosed on 08/15/2023 with left grade 3 invasive ductal carcinoma breast cancer. She underwent a left lumpectomy and sentinel node biopsy (13 negative nodes) on 07/29/2023. It is triple negative with a Ki67 of 95%. She has a history of a cervical fusion in 08/2022. She ended radiation in October 2024. She had a breast seroma aspirated of 10 cc on 01/22/2024 . She has had prior PT treatment for cording and left UE/breast swelling  PAIN:  Are you having pain? No - only if I touch it NPRS scale: 0/10 Pain location: left lateral breast Pain orientation: Left and Lateral  PAIN TYPE: burning and rushing, pressure  Aggravating factors: laying on left side,arm rubbing side, touching it,raising arm Relieving factors: propping her arm, advil  PRECAUTIONS:  L UE lymphedema risk, cervical fusion C6, C7   RED FLAGS: Compression fracture: Yes: T12    WEIGHT BEARING RESTRICTIONS: No  FALLS:  Has patient fallen in last 6 months? No  LIVING ENVIRONMENT: Lives with: lives with their spouse Lives in: House/apartment Stairs: Yes; External: 5 steps; can reach both Has following equipment at home: None  OCCUPATION: locksmith  LEISURE: play cards,   HAND DOMINANCE: right   PRIOR LEVEL OF FUNCTION: Independent  PATIENT GOALS: Get rid of pain and swelling   OBJECTIVE: Note: Objective measures were completed at Evaluation unless otherwise noted.  COGNITION: Overall cognitive status: Within functional limits for tasks assessed   PALPATION: Tender lateral breast, inferior breast,axilla  OBSERVATIONS / OTHER ASSESSMENTS: generalized swelling,mild redness left breast. Nipple red and irritated looking, significant indentation on left from compression bra  SENSATION: Light touch: Deficits      POSTURE: forward head, rounded shoulders  UPPER EXTREMITY AROM/PROM:  A/PROM RIGHT   eval   Shoulder extension   Shoulder flexion 155,    Shoulder abduction 165,  Shoulder internal  rotation   Shoulder external rotation     (Blank rows = not tested)  A/PROM LEFT   eval  Shoulder extension   Shoulder flexion 153  Shoulder abduction 162  Shoulder internal rotation   Shoulder external rotation     (Blank rows = not tested)  CERVICAL AROM: All within functional limits:     UPPER EXTREMITY STRENGTH:   LYMPHEDEMA ASSESSMENTS:   SURGERY TYPE/DATE: 07/29/23 L lumpectomy and SLNB   NUMBER OF LYMPH NODES REMOVED: 0+/13  CHEMOTHERAPY: no pt declined  RADIATION:YES, completed Oct 2024  HORMONE TREATMENT: None  INFECTIONS: none   LYMPHEDEMA ASSESSMENTS:   LANDMARK RIGHT  eval  At axilla  33.4  15 cm proximal to olecranon process 31.3  10 cm proximal to olecranon process 29.5  Olecranon process 26.4  15 cm proximal to ulnar styloid process 24.2  10 cm proximal to ulnar styloid process 21.0  Just proximal to ulnar styloid process 16.8  Across hand at thumb web space 19.8  At base of 2nd digit 6.4  (Blank rows = not tested)  LANDMARK LEFT  eval  At axilla  33.2  15 cm proximal to olecranon process 32.2  10 cm proximal to olecranon process 30.5  Olecranon process 27.9  15 cm proximal to ulnar styloid process 24.5  10 cm proximal to ulnar styloid process 21.5  Just proximal to ulnar styloid process 18.65  Across hand at thumb web space 21.0  At base of 2nd digit 6.35  (Blank rows = not tested)   FUNCTIONAL TESTS:    GAIT:WNL   L-DEX LYMPHEDEMA SCREENING: The patient was assessed using the L-Dex machine today to produce a lymphedema index baseline score. The patient will be reassessed on a regular basis (typically every 3 months) to obtain new L-Dex scores. If the score is > 6.5 points away from his/her baseline score indicating onset of subclinical lymphedema, it will be recommended to wear a compression garment for 4 weeks, 12 hours per day and then be reassessed. If the score continues to be >  6.5 points from baseline at reassessment, we will initiate lymphedema treatment. Assessing in this manner has a 95% rate of preventing clinically significant lymphedema.  QUICK DASH SURVEY: 48%    BREAST COMPLAINTS QUESTIONNAIRE Pain:8 Heaviness:10 Swollen feeling:10 Tense Skin:8 Redness:3 Bra Print:8 Size of Pores:5 Hard feeling: 8 Total:    60 /80 A Score over 9 indicates lymphedema issues in the breast   The patient was assessed using the L-Dex machine today to produce a lymphedema index baseline score. The patient will be reassessed on a regular basis (typically every 3 months) to obtain new L-Dex scores. If the score is > 6.5 points away from his/her baseline score indicating onset of subclinical lymphedema, it will be recommended to wear a compression garment for 4 weeks, 12 hours per day and then be reassessed. If the score continues to be > 6.5 points from baseline at reassessment, we will initiate lymphedema treatment. Assessing in this manner has a 95% rate of preventing clinically significant lymphedema.  TREATMENT DATE:  Pt permission and consent throughout each step of examination and treatment with modification and draping if requested when working on sensitive areas  02/16/24: Checked fit of sleeve and glove and they seem okay - not too tight  In supine: Short neck, 5 diaphragmatic breaths, R axillary nodes and establishment of interaxillary pathway, L inguinal nodes and establishment of axilloinguinal pathway, then L breast moving fluid towards pathways spending extra time in any areas of fibrosis then retracing all steps then LUE working proximal to distal and retracing all steps.  Gentle soft tissue mobilization to L lumpectomy scar  02/11/24: In supine: Short neck, 5 diaphragmatic breaths, R axillary nodes and establishment of interaxillary pathway, L inguinal nodes  and establishment of axilloinguinal pathway, then L breast moving fluid towards pathways spending extra time in any areas of fibrosis then retracing all steps then LUE working proximal to distal and retracing all steps.  Gentle soft tissue mobilization to L lumpectomy scar At end of session pt was able to palpate a vertical cord near axillary/breast fold where pt has increased pain so began gentle MFR to this area  02/04/2024 Discussed POC, LOS, treatment interventions. Had Blaire also check redness at breast;agrees no infection. Gave script for pt to look into getting a different compression bra at [facility] that might fit better. Pt to bring arm and hand garments next visit.    PATIENT EDUCATION:  Education details:POC, LOS, treatment interventions. Had Blaire also check redness at breast;agrees no infection. Gave script for pt to look into getting a different compression bra at [facility]. Dorothyann Gibbs also spoke to her about a Teacher, early years/pre. Person educated: Patient Education method: Explanation Education comprehension: verbalized understanding  HOME EXERCISE PROGRAM:   ASSESSMENT:  CLINICAL IMPRESSION: Focused on breast and L UE MLD to decrease edema. She now has her new compression sleeve, glove and compression bra but is getting pain in the armpit region after wearing if for hours. .  OBJECTIVE IMPAIRMENTS: decreased activity tolerance, decreased knowledge of condition, increased edema, impaired UE functional use, postural dysfunction, and pain.   ACTIVITY LIMITATIONS: sleeping, bed mobility, and reach over head  PARTICIPATION LIMITATIONS:  anything requiring use of her arm to reach in certain directions putting pressure on breast  PERSONAL FACTORS: 1-2 comorbidities: Left triple negative breast cancer s/p radiation  are also affecting patient's functional outcome.   REHAB POTENTIAL: Good  CLINICAL DECISION MAKING: Evolving/moderate complexity  EVALUATION COMPLEXITY:  Moderate  GOALS: Goals reviewed with patient? Yes  SHORT TERM GOALS Target date: 02/25/2024   1.  Pt will have decreased Left breast pain by 25%% Baseline:  Goal status: INITIAL  2.  Pt will have decreased breast complaints questionnaire score to 40/80 Baseline:  Goal status: INITIAL  3.  Pt will be compliant with compression bra/vest to decrease swelling Baseline:  Goal status: INITIAL  4.  Pt will have new compression garments for arm/hand prn Baseline:  Goal status: INITIAL  LONG TERM GOALS: Target date: 03/17/2024  Pt will have decreased breast pain by 50% for improved activity tolerance Baseline:  Goal status: INITIAL  2.  Pt will be independent with MLD to left breast and UE prn to decrease swelling Baseline:  Goal status: INITIAL  3.  Breast complaints questionnaire will be decreased to no greater than 25 to demonstrate improved swelling Baseline:  Goal status: INITIAL  4.  Pt will be able to sleep on her left side without left breast pain Baseline:  Goal status:  INITIAL  5.  Pt will be tolerant of UE compression garments and will note decreased edema throughout Baseline:  Goal status: INITIAL    PLAN:  PT FREQUENCY: 2x/week  PT DURATION: 6 weeks  PLANNED INTERVENTIONS: 97164- PT Re-evaluation, 97110-Therapeutic exercises, 97530- Therapeutic activity, 97112- Neuromuscular re-education, 97535- Self Care, 16109- Manual therapy, and 97760- Orthotic Fit/training  PLAN FOR NEXT SESSION: MFR to breast cord and axillary cording, breast MLD, UE MLD,   Kolleen Ochsner R, PT 02/16/2024, 11:52 AM

## 2024-02-19 ENCOUNTER — Ambulatory Visit: Payer: Medicare Other | Admitting: Rehabilitation

## 2024-02-25 ENCOUNTER — Ambulatory Visit: Payer: Medicare Other

## 2024-02-25 DIAGNOSIS — N644 Mastodynia: Secondary | ICD-10-CM | POA: Diagnosis not present

## 2024-02-25 DIAGNOSIS — M25612 Stiffness of left shoulder, not elsewhere classified: Secondary | ICD-10-CM

## 2024-02-25 DIAGNOSIS — Z483 Aftercare following surgery for neoplasm: Secondary | ICD-10-CM

## 2024-02-25 DIAGNOSIS — Z171 Estrogen receptor negative status [ER-]: Secondary | ICD-10-CM

## 2024-02-25 DIAGNOSIS — C50412 Malignant neoplasm of upper-outer quadrant of left female breast: Secondary | ICD-10-CM | POA: Diagnosis not present

## 2024-02-25 DIAGNOSIS — R293 Abnormal posture: Secondary | ICD-10-CM

## 2024-02-25 DIAGNOSIS — I890 Lymphedema, not elsewhere classified: Secondary | ICD-10-CM

## 2024-02-25 NOTE — Therapy (Signed)
 OUTPATIENT PHYSICAL THERAPY  UPPER EXTREMITY ONCOLOGY  Patient Name: [name] [name] MRN: [id number] DOB:02-02-1951, 73 y.o., female Today's Date: 02/25/2024  END OF SESSION:  PT End of Session - 02/25/24 1058     Visit Number 4    Number of Visits 12    Date for PT Re-Evaluation 03/17/24    PT Start Time 1100    PT Stop Time 1157    PT Time Calculation (min) 57 min    Activity Tolerance Patient tolerated treatment well    Behavior During Therapy WFL for tasks assessed/performed              Past Medical History:  Diagnosis Date   Adenomatous colon polyp    Allergy    Anxiety    Arthritis    bilateral hands. lower back   Cancer [facility])    skin   Cataract    bilateral   COPD (chronic obstructive pulmonary disease) (HCC)    Depression    Diverticulosis    Endometriosis    GERD (gastroesophageal reflux disease)    Glaucoma    Headache    Migraines   Hiatal hernia    History of left breast cancer 05/2023   History of radiation therapy    Left breast- 09/25/23-10/17/23- Dr. Antony Blackbird   IBS (irritable bowel syndrome)    Inguinal hernia    Pneumothorax 1987   PONV (postoperative nausea and vomiting)    Past Surgical History:  Procedure Laterality Date   ABDOMINAL HYSTERECTOMY  1992   ANTERIOR CERVICAL DECOMP/DISCECTOMY FUSION N/A 09/23/2022   Procedure: Anterior Decompression Fusion,PLATE/SCREWS Cervical five-six; REMOVAL OLD PLATE;  Surgeon: Tressie Stalker, MD;  Location: [facility] OR;  Service: Neurosurgery;  Laterality: N/A;   APPENDECTOMY  1992   BACK SURGERY     cervical   BREAST BIOPSY Left 06/23/2023   [place] LT BREAST BX W LOC DEV 1ST LESION IMG BX SPEC US GUIDE 06/23/2023 [facility]   BREAST BIOPSY Left 07/28/2023   [place] LT RADIOACTIVE SEED LOC 07/28/2023 [facility]   BREAST LUMPECTOMY WITH RADIOACTIVE SEED AND SENTINEL LYMPH NODE BIOPSY Left 07/29/2023   Procedure: LEFT BREAST SEED LUMPECTOMY WITH LEFT SENTINEL LYMPH NODE MAPPING;  Surgeon:  Harriette Bouillon, MD;  Location: [facility] OR;  Service: General;  Laterality: Left;  PEC BLOCK   CATARACT EXTRACTION Bilateral    2021, 2022   COLONOSCOPY  2023   PLEURAL SCARIFICATION     ruptured disk     torn tendon     right arm   TUBAL LIGATION     Patient Active Problem List   Diagnosis Date Noted   Genetic testing 07/11/2023   Malignant neoplasm of upper-outer quadrant of left breast in female, estrogen receptor negative (HCC) 06/30/2023   Cervical spondylosis with radiculopathy 09/23/2022   Elevated low-density lipoprotein level 06/14/2022   Coronary arteriosclerosis 06/14/2022   Adverse reaction to antihyperlipidemic drug, initial encounter 06/14/2022   Diarrhea 01/27/2019   Change in bowel habits 01/05/2019   Left inguinal hernia 06/27/2014   Bloating 05/17/2013   Constipation 05/17/2013   Pneumothorax 10/17/2008   HIATAL HERNIA 10/17/2008   DIVERTICULOSIS, COLON 10/17/2008   ENDOMETRIOSIS 10/17/2008   Right lower quadrant abdominal pain 10/17/2008   DEPRESSION, HX OF 10/17/2008   History of colonic polyps 10/17/2008   PNEUMOTHORAX 10/17/2008    PCP: Dr Renne Crigler   REFERRING PROVIDER: Rachel Moulds, MD  REFERRING DIAG: Left Breast Cancer  THERAPY DIAG:  Lymphedema, not elsewhere classified  Breast pain  Malignant neoplasm of upper-outer quadrant of left breast in female, estrogen receptor negative (HCC)  Aftercare following surgery for neoplasm  Abnormal posture  Stiffness of left shoulder, not elsewhere classified  ONSET DATE: 1 year ago  Rationale for Evaluation and Treatment: Rehabilitation  SUBJECTIVE:                                                                                                                                                                                           SUBJECTIVE STATEMENT:  I put sleeve on at 9:00, but at 3:00 it feels really tight and gives me pins and needles. My glove makes my hand feel like it swells more. My  swelling in the breast seems worse at the side, and upper breast. I feel like I may have cording in the breast.     PERTINENT HISTORY:  Patient was diagnosed on 08/15/2023 with left grade 3 invasive ductal carcinoma breast cancer. She underwent a left lumpectomy and sentinel node biopsy (13 negative nodes) on 07/29/2023. It is triple negative with a Ki67 of 95%. She has a history of a cervical fusion in 08/2022. She ended radiation in October 2024. She had a breast seroma aspirated of 10 cc on 01/22/2024 . She has had prior PT treatment for cording and left UE/breast swelling  PAIN:  Are you having pain?  NPRS scale: 0/10 at rest, 3/10 with arm rubbing breast or with light touch 5-6/10 Pain location: left lateral breast Pain orientation: Left and Lateral  PAIN TYPE: burning and rushing, pressure  Aggravating factors: laying on left side,arm rubbing side, touching it,raising arm Relieving factors: propping her arm, advil  PRECAUTIONS:  L UE lymphedema risk, cervical fusion C6, C7   RED FLAGS: Compression fracture: Yes: T12    WEIGHT BEARING RESTRICTIONS: No  FALLS:  Has patient fallen in last 6 months? No  LIVING ENVIRONMENT: Lives with: lives with their spouse Lives in: House/apartment Stairs: Yes; External: 5 steps; can reach both Has following equipment at home: None  OCCUPATION: locksmith  LEISURE: play cards,   HAND DOMINANCE: right   PRIOR LEVEL OF FUNCTION: Independent  PATIENT GOALS: Get rid of pain and swelling   OBJECTIVE: Note: Objective measures were completed at Evaluation unless otherwise noted.  COGNITION: Overall cognitive status: Within functional limits for tasks assessed   PALPATION: Tender lateral breast, inferior breast,axilla  OBSERVATIONS / OTHER ASSESSMENTS: generalized swelling,mild redness left breast. Nipple red and irritated looking, significant indentation on left from compression bra  SENSATION: Light touch: Deficits      POSTURE:  forward head, rounded shoulders  UPPER EXTREMITY AROM/PROM:  A/PROM RIGHT  eval   Shoulder extension   Shoulder flexion 155,   Shoulder abduction 165,  Shoulder internal rotation   Shoulder external rotation     (Blank rows = not tested)  A/PROM LEFT   eval  Shoulder extension   Shoulder flexion 153  Shoulder abduction 162  Shoulder internal rotation   Shoulder external rotation     (Blank rows = not tested)  CERVICAL AROM: All within functional limits:     UPPER EXTREMITY STRENGTH:   LYMPHEDEMA ASSESSMENTS:   SURGERY TYPE/DATE: 07/29/23 L lumpectomy and SLNB   NUMBER OF LYMPH NODES REMOVED: 0+/13  CHEMOTHERAPY: no pt declined  RADIATION:YES, completed Oct 2024  HORMONE TREATMENT: None  INFECTIONS: none   LYMPHEDEMA ASSESSMENTS:   LANDMARK RIGHT  eval  At axilla  33.4  15 cm proximal to olecranon process 31.3  10 cm proximal to olecranon process 29.5  Olecranon process 26.4  15 cm proximal to ulnar styloid process 24.2  10 cm proximal to ulnar styloid process 21.0  Just proximal to ulnar styloid process 16.8  Across hand at thumb web space 19.8  At base of 2nd digit 6.4  (Blank rows = not tested)  LANDMARK LEFT  eval LEFT 02/25/2024  At axilla  33.2 33.9  15 cm proximal to olecranon process 32.2 31.3  10 cm proximal to olecranon process 30.5 30.6  Olecranon process 27.9 27.6  15 cm proximal to ulnar styloid process 24.5 24.1  10 cm proximal to ulnar styloid process 21.5 21  Just proximal to ulnar styloid process 18.65 17.5  Across hand at thumb web space 21.0 20.5  At base of 2nd digit 6.35 6.3  (Blank rows = not tested)   FUNCTIONAL TESTS:    GAIT:WNL   L-DEX LYMPHEDEMA SCREENING: The patient was assessed using the L-Dex machine today to produce a lymphedema index baseline score. The patient will be reassessed on a regular basis (typically every 3 months) to obtain new L-Dex scores. If the score is > 6.5 points away from his/her  baseline score indicating onset of subclinical lymphedema, it will be recommended to wear a compression garment for 4 weeks, 12 hours per day and then be reassessed. If the score continues to be > 6.5 points from baseline at reassessment, we will initiate lymphedema treatment. Assessing in this manner has a 95% rate of preventing clinically significant lymphedema.  QUICK DASH SURVEY: 48%    BREAST COMPLAINTS QUESTIONNAIRE Pain:8 Heaviness:10 Swollen feeling:10 Tense Skin:8 Redness:3 Bra Print:8 Size of Pores:5 Hard feeling: 8 Total:    60 /80 A Score over 9 indicates lymphedema issues in the breast   The patient was assessed using the L-Dex machine today to produce a lymphedema index baseline score. The patient will be reassessed on a regular basis (typically every 3 months) to obtain new L-Dex scores. If the score is > 6.5 points away from his/her baseline score indicating onset of subclinical lymphedema, it will be recommended to wear a compression garment for 4 weeks, 12 hours per day and then be reassessed. If the score continues to be > 6.5 points from baseline at reassessment, we will initiate lymphedema treatment. Assessing in this manner has a 95% rate of preventing clinically significant lymphedema.  TREATMENT DATE:  Pt permission and consent throughout each step of examination and treatment with modification and draping if requested when working on sensitive areas  02/25/2024 Measured left UE with some reduction noted Pt getting discomfort at top of sleeve and swelling top of hand with glove. Possibly sleeve laying on tight cording noted there;made small piece of 1/4 in foam to put under top band of sleeve. Pt will put glove on and off to try and avoid dorsum of hand swelling. Maybe foam pad in glove would help dorsum of hand. Lowered strap on bra to try and get axillary part  from pinching at her armpit. In supine: Short neck, 5 diaphragmatic breaths, R axillary nodes and establishment of interaxillary pathway, L inguinal nodes and establishment of axilloinguinal pathway, then L breast moving fluid towards pathways spending extra time in any areas of fibrosis then retracing all steps then ending with LN's. Did some in right SL to left lateral breast and AI pathway and ended with LN's MFR to left axillary cording. Showed pt shoulder extension with wrist ext to stretch cords 02/16/24: Checked fit of sleeve and glove and they seem okay - not too tight  In supine: Short neck, 5 diaphragmatic breaths, R axillary nodes and establishment of interaxillary pathway, L inguinal nodes and establishment of axilloinguinal pathway, then L breast moving fluid towards pathways spending extra time in any areas of fibrosis then retracing all steps then LUE working proximal to distal and retracing all steps.  Gentle soft tissue mobilization to L lumpectomy scar  02/11/24: In supine: Short neck, 5 diaphragmatic breaths, R axillary nodes and establishment of interaxillary pathway, L inguinal nodes and establishment of axilloinguinal pathway, then L breast moving fluid towards pathways spending extra time in any areas of fibrosis then retracing all steps then LUE working proximal to distal and retracing all steps.  Gentle soft tissue mobilization to L lumpectomy scar At end of session pt was able to palpate a vertical cord near axillary/breast fold where pt has increased pain so began gentle MFR to this area  02/04/2024 Discussed POC, LOS, treatment interventions. Had Blaire also check redness at breast;agrees no infection. Gave script for pt to look into getting a different compression bra at [facility] [place] that might fit better. Pt to bring arm and hand garments next visit.    PATIENT EDUCATION:  Education details:POC, LOS, treatment interventions. Had Blaire also check redness at  breast;agrees no infection. Gave script for pt to look into getting a different compression bra at [facility] [place]. Dorothyann Gibbs also spoke to her about a Teacher, early years/pre. Person educated: Patient Education method: Explanation Education comprehension: verbalized understanding  HOME EXERCISE PROGRAM:   ASSESSMENT:  CLINICAL IMPRESSION: Focused on Left  breast edema, and axillary cording as these seem to bother her the most.She now has her new compression sleeve, glove and compression . Possible cord at left breast but difficult to palpate. Pt still very tender there and has increased pain when her arm brushes her breast. .  OBJECTIVE IMPAIRMENTS: decreased activity tolerance, decreased knowledge of condition, increased edema, impaired UE functional use, postural dysfunction, and pain.   ACTIVITY LIMITATIONS: sleeping, bed mobility, and reach over head  PARTICIPATION LIMITATIONS:  anything requiring use of her arm to reach in certain directions putting pressure on breast  PERSONAL FACTORS: 1-2 comorbidities: Left triple negative breast cancer s/p radiation  are also affecting patient's functional outcome.   REHAB POTENTIAL: Good  CLINICAL DECISION MAKING: Evolving/moderate complexity  EVALUATION COMPLEXITY: Moderate  GOALS: Goals reviewed with patient? Yes  SHORT TERM GOALS Target date: 02/25/2024   1.  Pt will have decreased Left breast pain by 25%% Baseline:  Goal status: INITIAL  2.  Pt will have decreased breast complaints questionnaire score to 40/80 Baseline:  Goal status: INITIAL  3.  Pt will be compliant with compression bra/vest to decrease swelling Baseline:  Goal status: INITIAL  4.  Pt will have new compression garments for arm/hand prn Baseline:  Goal status: INITIAL  LONG TERM GOALS: Target date: 03/17/2024  Pt will have decreased breast pain by 50% for improved activity tolerance Baseline:  Goal status: INITIAL  2.  Pt will be independent with MLD to left breast and  UE prn to decrease swelling Baseline:  Goal status: INITIAL  3.  Breast complaints questionnaire will be decreased to no greater than 25 to demonstrate improved swelling Baseline:  Goal status: INITIAL  4.  Pt will be able to sleep on her left side without left breast pain Baseline:  Goal status: INITIAL  5.  Pt will be tolerant of UE compression garments and will note decreased edema throughout Baseline:  Goal status: INITIAL    PLAN:  PT FREQUENCY: 2x/week  PT DURATION: 6 weeks  PLANNED INTERVENTIONS: 97164- PT Re-evaluation, 97110-Therapeutic exercises, 97530- Therapeutic activity, 97112- Neuromuscular re-education, 97535- Self Care, 82956- Manual therapy, and 97760- Orthotic Fit/training  PLAN FOR NEXT SESSION: MFR to breast cord and axillary cording, breast MLD, UE MLD, continue to modify garments prn  Waynette Buttery, PT 02/25/2024, 12:00 PM

## 2024-02-27 ENCOUNTER — Ambulatory Visit: Payer: Medicare Other

## 2024-02-27 DIAGNOSIS — Z171 Estrogen receptor negative status [ER-]: Secondary | ICD-10-CM

## 2024-02-27 DIAGNOSIS — N644 Mastodynia: Secondary | ICD-10-CM

## 2024-02-27 DIAGNOSIS — I890 Lymphedema, not elsewhere classified: Secondary | ICD-10-CM | POA: Diagnosis not present

## 2024-02-27 DIAGNOSIS — C50412 Malignant neoplasm of upper-outer quadrant of left female breast: Secondary | ICD-10-CM | POA: Diagnosis not present

## 2024-02-27 NOTE — Therapy (Signed)
 OUTPATIENT PHYSICAL THERAPY  UPPER EXTREMITY ONCOLOGY  Patient Name: [name] [name] MRN: [id number] DOB:October 22, 1951, 73 y.o., female Today's Date: 02/27/2024  END OF SESSION:  PT End of Session - 02/27/24 1004     Visit Number 5    Number of Visits 12    Date for PT Re-Evaluation 03/17/24    PT Start Time 1005    PT Stop Time 1104    PT Time Calculation (min) 59 min    Activity Tolerance Patient tolerated treatment well    Behavior During Therapy WFL for tasks assessed/performed              Past Medical History:  Diagnosis Date   Adenomatous colon polyp    Allergy    Anxiety    Arthritis    bilateral hands. lower back   Cancer [facility])    skin   Cataract    bilateral   COPD (chronic obstructive pulmonary disease) (HCC)    Depression    Diverticulosis    Endometriosis    GERD (gastroesophageal reflux disease)    Glaucoma    Headache    Migraines   Hiatal hernia    History of left breast cancer 05/2023   History of radiation therapy    Left breast- 09/25/23-10/17/23- Dr. Antony Blackbird   IBS (irritable bowel syndrome)    Inguinal hernia    Pneumothorax 1987   PONV (postoperative nausea and vomiting)    Past Surgical History:  Procedure Laterality Date   ABDOMINAL HYSTERECTOMY  1992   ANTERIOR CERVICAL DECOMP/DISCECTOMY FUSION N/A 09/23/2022   Procedure: Anterior Decompression Fusion,PLATE/SCREWS Cervical five-six; REMOVAL OLD PLATE;  Surgeon: Tressie Stalker, MD;  Location: [facility] OR;  Service: Neurosurgery;  Laterality: N/A;   APPENDECTOMY  1992   BACK SURGERY     cervical   BREAST BIOPSY Left 06/23/2023   [place] LT BREAST BX W LOC DEV 1ST LESION IMG BX SPEC US GUIDE 06/23/2023 [facility]   BREAST BIOPSY Left 07/28/2023   [place] LT RADIOACTIVE SEED LOC 07/28/2023 [facility]   BREAST LUMPECTOMY WITH RADIOACTIVE SEED AND SENTINEL LYMPH NODE BIOPSY Left 07/29/2023   Procedure: LEFT BREAST SEED LUMPECTOMY WITH LEFT SENTINEL LYMPH NODE MAPPING;  Surgeon:  Harriette Bouillon, MD;  Location: [facility] OR;  Service: General;  Laterality: Left;  PEC BLOCK   CATARACT EXTRACTION Bilateral    2021, 2022   COLONOSCOPY  2023   PLEURAL SCARIFICATION     ruptured disk     torn tendon     right arm   TUBAL LIGATION     Patient Active Problem List   Diagnosis Date Noted   Genetic testing 07/11/2023   Malignant neoplasm of upper-outer quadrant of left breast in female, estrogen receptor negative (HCC) 06/30/2023   Cervical spondylosis with radiculopathy 09/23/2022   Elevated low-density lipoprotein level 06/14/2022   Coronary arteriosclerosis 06/14/2022   Adverse reaction to antihyperlipidemic drug, initial encounter 06/14/2022   Diarrhea 01/27/2019   Change in bowel habits 01/05/2019   Left inguinal hernia 06/27/2014   Bloating 05/17/2013   Constipation 05/17/2013   Pneumothorax 10/17/2008   HIATAL HERNIA 10/17/2008   DIVERTICULOSIS, COLON 10/17/2008   ENDOMETRIOSIS 10/17/2008   Right lower quadrant abdominal pain 10/17/2008   DEPRESSION, HX OF 10/17/2008   History of colonic polyps 10/17/2008   PNEUMOTHORAX 10/17/2008    PCP: Dr Renne Crigler   REFERRING PROVIDER: Rachel Moulds, MD  REFERRING DIAG: Left Breast Cancer  THERAPY DIAG:  Lymphedema, not elsewhere classified  Breast pain  Malignant neoplasm of upper-outer quadrant of left breast in female, estrogen receptor negative (HCC)  ONSET DATE: 1 year ago  Rationale for Evaluation and Treatment: Rehabilitation  SUBJECTIVE:                                                                                                                                                                                           SUBJECTIVE STATEMENT:  The foam did not help at the top of my sleeve. It still left an imprint and got uncomfortable. My sleeve feels more tight in the afternoon. I wore the glove until about 2:30 and then when I took it off the back of my hand was swollen.  The cords are still there and  painful.   PERTINENT HISTORY:  Patient was diagnosed on 08/15/2023 with left grade 3 invasive ductal carcinoma breast cancer. She underwent a left lumpectomy and sentinel node biopsy (13 negative nodes) on 07/29/2023. It is triple negative with a Ki67 of 95%. She has a history of a cervical fusion in 08/2022. She ended radiation in October 2024. She had a breast seroma aspirated of 10 cc on 01/22/2024 . She has had prior PT treatment for cording and left UE/breast swelling  PAIN:  Are you having pain?  NPRS scale: 0/10 at rest, 3/10 with arm rubbing breast or with light touch 5-6/10 Pain location: left lateral breast Pain orientation: Left and Lateral  PAIN TYPE: burning and rushing, pressure  Aggravating factors: laying on left side,arm rubbing side, touching it,raising arm Relieving factors: propping her arm, advil  PRECAUTIONS:  L UE lymphedema risk, cervical fusion C6, C7   RED FLAGS: Compression fracture: Yes: T12    WEIGHT BEARING RESTRICTIONS: No  FALLS:  Has patient fallen in last 6 months? No  LIVING ENVIRONMENT: Lives with: lives with their spouse Lives in: House/apartment Stairs: Yes; External: 5 steps; can reach both Has following equipment at home: None  OCCUPATION: locksmith  LEISURE: play cards,   HAND DOMINANCE: right   PRIOR LEVEL OF FUNCTION: Independent  PATIENT GOALS: Get rid of pain and swelling   OBJECTIVE: Note: Objective measures were completed at Evaluation unless otherwise noted.  COGNITION: Overall cognitive status: Within functional limits for tasks assessed   PALPATION: Tender lateral breast, inferior breast,axilla  OBSERVATIONS / OTHER ASSESSMENTS: generalized swelling,mild redness left breast. Nipple red and irritated looking, significant indentation on left from compression bra  SENSATION: Light touch: Deficits      POSTURE: forward head, rounded shoulders  UPPER EXTREMITY AROM/PROM:  A/PROM RIGHT   eval   Shoulder extension    Shoulder flexion 155,   Shoulder abduction 165,  Shoulder internal rotation   Shoulder external rotation     (Blank rows = not tested)  A/PROM LEFT   eval  Shoulder extension   Shoulder flexion 153  Shoulder abduction 162  Shoulder internal rotation   Shoulder external rotation     (Blank rows = not tested)  CERVICAL AROM: All within functional limits:     UPPER EXTREMITY STRENGTH:   LYMPHEDEMA ASSESSMENTS:   SURGERY TYPE/DATE: 07/29/23 L lumpectomy and SLNB   NUMBER OF LYMPH NODES REMOVED: 0+/13  CHEMOTHERAPY: no pt declined  RADIATION:YES, completed Oct 2024  HORMONE TREATMENT: None  INFECTIONS: none   LYMPHEDEMA ASSESSMENTS:   LANDMARK RIGHT  eval  At axilla  33.4  15 cm proximal to olecranon process 31.3  10 cm proximal to olecranon process 29.5  Olecranon process 26.4  15 cm proximal to ulnar styloid process 24.2  10 cm proximal to ulnar styloid process 21.0  Just proximal to ulnar styloid process 16.8  Across hand at thumb web space 19.8  At base of 2nd digit 6.4  (Blank rows = not tested)  LANDMARK LEFT  eval LEFT 02/25/2024  At axilla  33.2 33.9  15 cm proximal to olecranon process 32.2 31.3  10 cm proximal to olecranon process 30.5 30.6  Olecranon process 27.9 27.6  15 cm proximal to ulnar styloid process 24.5 24.1  10 cm proximal to ulnar styloid process 21.5 21  Just proximal to ulnar styloid process 18.65 17.5  Across hand at thumb web space 21.0 20.5  At base of 2nd digit 6.35 6.3  (Blank rows = not tested)   FUNCTIONAL TESTS:    GAIT:WNL   L-DEX LYMPHEDEMA SCREENING: The patient was assessed using the L-Dex machine today to produce a lymphedema index baseline score. The patient will be reassessed on a regular basis (typically every 3 months) to obtain new L-Dex scores. If the score is > 6.5 points away from his/her baseline score indicating onset of subclinical lymphedema, it will be recommended to wear a compression garment for  4 weeks, 12 hours per day and then be reassessed. If the score continues to be > 6.5 points from baseline at reassessment, we will initiate lymphedema treatment. Assessing in this manner has a 95% rate of preventing clinically significant lymphedema.  QUICK DASH SURVEY: 48%    BREAST COMPLAINTS QUESTIONNAIRE Pain:8 Heaviness:10 Swollen feeling:10 Tense Skin:8 Redness:3 Bra Print:8 Size of Pores:5 Hard feeling: 8 Total:    60 /80 A Score over 9 indicates lymphedema issues in the breast   The patient was assessed using the L-Dex machine today to produce a lymphedema index baseline score. The patient will be reassessed on a regular basis (typically every 3 months) to obtain new L-Dex scores. If the score is > 6.5 points away from his/her baseline score indicating onset of subclinical lymphedema, it will be recommended to wear a compression garment for 4 weeks, 12 hours per day and then be reassessed. If the score continues to be > 6.5 points from baseline at reassessment, we will initiate lymphedema treatment. Assessing in this manner has a 95% rate of preventing clinically significant lymphedema.  TREATMENT DATE:  Pt permission and consent throughout each step of examination and treatment with modification and draping if requested when working on sensitive areas 02/26/2023 02/27/2024 MFR to right axillary, upper arm, and forearm cording, and briefly to left breast cording running vertically from clavicle, and horizontally across breast In supine: Short neck, 5 diaphragmatic breaths, R axillary nodes and establishment of interaxillary pathway, L inguinal nodes and establishment of axilloinguinal pathway, then L breast moving fluid towards pathways spending extra time in any areas of fibrosis then retracing all steps then ending with LN's.  Small piece of comprilan soft foam made for medial  arm sleeve under silicone. Peach dot foam in stockinette made and placed at dorsum of hand, but will be very difficult for pt to get in herself. Advised she can try with flat side down or dot side down but to only wear if comfortable. Advised to remove sleeve in afternoon if it is uncomfortable, and replace in an hour or so after arm rests. Repeat SOZO next   02/25/2024 Measured left UE with some reduction noted Pt getting discomfort at top of sleeve and swelling top of hand with glove. Possibly sleeve laying on tight cording noted there;made small piece of 1/4 in foam to put under top band of sleeve. Pt will put glove on and off to try and avoid dorsum of hand swelling. Maybe foam pad in glove would help dorsum of hand. Lowered strap on bra to try and get axillary part from pinching at her armpit. In supine: Short neck, 5 diaphragmatic breaths, R axillary nodes and establishment of interaxillary pathway, L inguinal nodes and establishment of axilloinguinal pathway, then L breast moving fluid towards pathways spending extra time in any areas of fibrosis then retracing all steps then ending with LN's. Did some in right SL to left lateral breast and AI pathway and ended with LN's MFR to left axillary cording. Showed pt shoulder extension with wrist ext to stretch cords 02/16/24: Checked fit of sleeve and glove and they seem okay - not too tight  In supine: Short neck, 5 diaphragmatic breaths, R axillary nodes and establishment of interaxillary pathway, L inguinal nodes and establishment of axilloinguinal pathway, then L breast moving fluid towards pathways spending extra time in any areas of fibrosis then retracing all steps then LUE working proximal to distal and retracing all steps.  Gentle soft tissue mobilization to L lumpectomy scar  02/11/24: In supine: Short neck, 5 diaphragmatic breaths, R axillary nodes and establishment of interaxillary pathway, L inguinal nodes and establishment of  axilloinguinal pathway, then L breast moving fluid towards pathways spending extra time in any areas of fibrosis then retracing all steps then LUE working proximal to distal and retracing all steps.  Gentle soft tissue mobilization to L lumpectomy scar At end of session pt was able to palpate a vertical cord near axillary/breast fold where pt has increased pain so began gentle MFR to this area  02/04/2024 Discussed POC, LOS, treatment interventions. Had Blaire also check redness at breast;agrees no infection. Gave script for pt to look into getting a different compression bra at [facility] that might fit better. Pt to bring arm and hand garments next visit.    PATIENT EDUCATION:  Education details:POC, LOS, treatment interventions. Had Blaire also check redness at breast;agrees no infection. Gave script for pt to look into getting a different compression bra at [facility] [place]. Dorothyann Gibbs also spoke to her about a Teacher, early years/pre. Person educated: Patient Education method: Explanation  Education comprehension: verbalized understanding  HOME EXERCISE PROGRAM:   ASSESSMENT:  CLINICAL IMPRESSION: No significant improvement after previous visit. Sleeve still uncomfortable at the top and dorsum of hand still swelling. Breast cording palpated better today in sitting and still very tender. Will try and wear sleeve and glove into the afternoon but remove when uncomfortable and then if able to redon after allowing arm to rest from sleeve a short while. SOZO next   OBJECTIVE IMPAIRMENTS: decreased activity tolerance, decreased knowledge of condition, increased edema, impaired UE functional use, postural dysfunction, and pain.   ACTIVITY LIMITATIONS: sleeping, bed mobility, and reach over head  PARTICIPATION LIMITATIONS:  anything requiring use of her arm to reach in certain directions putting pressure on breast  PERSONAL FACTORS: 1-2 comorbidities: Left triple negative breast cancer s/p radiation  are also  affecting patient's functional outcome.   REHAB POTENTIAL: Good  CLINICAL DECISION MAKING: Evolving/moderate complexity  EVALUATION COMPLEXITY: Moderate  GOALS: Goals reviewed with patient? Yes  SHORT TERM GOALS Target date: 02/25/2024   1.  Pt will have decreased Left breast pain by 25%% Baseline:  Goal status: INITIAL  2.  Pt will have decreased breast complaints questionnaire score to 40/80 Baseline:  Goal status: INITIAL  3.  Pt will be compliant with compression bra/vest to decrease swelling Baseline:  Goal status: INITIAL  4.  Pt will have new compression garments for arm/hand prn Baseline:  Goal status: INITIAL  LONG TERM GOALS: Target date: 03/17/2024  Pt will have decreased breast pain by 50% for improved activity tolerance Baseline:  Goal status: INITIAL  2.  Pt will be independent with MLD to left breast and UE prn to decrease swelling Baseline:  Goal status: INITIAL  3.  Breast complaints questionnaire will be decreased to no greater than 25 to demonstrate improved swelling Baseline:  Goal status: INITIAL  4.  Pt will be able to sleep on her left side without left breast pain Baseline:  Goal status: INITIAL  5.  Pt will be tolerant of UE compression garments and will note decreased edema throughout Baseline:  Goal status: INITIAL    PLAN:  PT FREQUENCY: 2x/week  PT DURATION: 6 weeks  PLANNED INTERVENTIONS: 97164- PT Re-evaluation, 97110-Therapeutic exercises, 97530- Therapeutic activity, 97112- Neuromuscular re-education, 97535- Self Care, 16109- Manual therapy, and 97760- Orthotic Fit/training  PLAN FOR NEXT SESSION: repeat SOZO nextMFR to breast cord and axillary cording, breast MLD, UE MLD, continue to modify garments prn  Waynette Buttery, PT 02/27/2024, 12:38 PM

## 2024-03-02 DIAGNOSIS — M47816 Spondylosis without myelopathy or radiculopathy, lumbar region: Secondary | ICD-10-CM | POA: Diagnosis not present

## 2024-03-02 DIAGNOSIS — M533 Sacrococcygeal disorders, not elsewhere classified: Secondary | ICD-10-CM | POA: Diagnosis not present

## 2024-03-04 ENCOUNTER — Ambulatory Visit: Payer: Medicare Other | Attending: Hematology and Oncology | Admitting: Physical Therapy

## 2024-03-04 ENCOUNTER — Encounter: Payer: Self-pay | Admitting: Physical Therapy

## 2024-03-04 DIAGNOSIS — R293 Abnormal posture: Secondary | ICD-10-CM | POA: Insufficient documentation

## 2024-03-04 DIAGNOSIS — N644 Mastodynia: Secondary | ICD-10-CM | POA: Diagnosis not present

## 2024-03-04 DIAGNOSIS — M25612 Stiffness of left shoulder, not elsewhere classified: Secondary | ICD-10-CM | POA: Insufficient documentation

## 2024-03-04 DIAGNOSIS — Z483 Aftercare following surgery for neoplasm: Secondary | ICD-10-CM | POA: Insufficient documentation

## 2024-03-04 DIAGNOSIS — R600 Localized edema: Secondary | ICD-10-CM | POA: Insufficient documentation

## 2024-03-04 DIAGNOSIS — Z171 Estrogen receptor negative status [ER-]: Secondary | ICD-10-CM | POA: Diagnosis not present

## 2024-03-04 DIAGNOSIS — I890 Lymphedema, not elsewhere classified: Secondary | ICD-10-CM | POA: Insufficient documentation

## 2024-03-04 DIAGNOSIS — C50412 Malignant neoplasm of upper-outer quadrant of left female breast: Secondary | ICD-10-CM | POA: Insufficient documentation

## 2024-03-04 NOTE — Therapy (Signed)
 OUTPATIENT PHYSICAL THERAPY  UPPER EXTREMITY ONCOLOGY  Patient Name: [name] [name] MRN: [id number] DOB:1951/02/27, 73 y.o., female Today's Date: 03/04/2024  END OF SESSION:  PT End of Session - 03/04/24 1107     Visit Number 6    Number of Visits 12    Date for PT Re-Evaluation 03/17/24    PT Start Time 1105    PT Stop Time 1155    PT Time Calculation (min) 50 min    Activity Tolerance Patient tolerated treatment well    Behavior During Therapy WFL for tasks assessed/performed              Past Medical History:  Diagnosis Date   Adenomatous colon polyp    Allergy    Anxiety    Arthritis    bilateral hands. lower back   Cancer [facility])    skin   Cataract    bilateral   COPD (chronic obstructive pulmonary disease) (HCC)    Depression    Diverticulosis    Endometriosis    GERD (gastroesophageal reflux disease)    Glaucoma    Headache    Migraines   Hiatal hernia    History of left breast cancer 05/2023   History of radiation therapy    Left breast- 09/25/23-10/17/23- Dr. Antony Blackbird   IBS (irritable bowel syndrome)    Inguinal hernia    Pneumothorax 1987   PONV (postoperative nausea and vomiting)    Past Surgical History:  Procedure Laterality Date   ABDOMINAL HYSTERECTOMY  1992   ANTERIOR CERVICAL DECOMP/DISCECTOMY FUSION N/A 09/23/2022   Procedure: Anterior Decompression Fusion,PLATE/SCREWS Cervical five-six; REMOVAL OLD PLATE;  Surgeon: Tressie Stalker, MD;  Location: [facility] OR;  Service: Neurosurgery;  Laterality: N/A;   APPENDECTOMY  1992   BACK SURGERY     cervical   BREAST BIOPSY Left 06/23/2023   [place] LT BREAST BX W LOC DEV 1ST LESION IMG BX SPEC US GUIDE 06/23/2023 [facility]   BREAST BIOPSY Left 07/28/2023   [place] LT RADIOACTIVE SEED LOC 07/28/2023 [facility]   BREAST LUMPECTOMY WITH RADIOACTIVE SEED AND SENTINEL LYMPH NODE BIOPSY Left 07/29/2023   Procedure: LEFT BREAST SEED LUMPECTOMY WITH LEFT SENTINEL LYMPH NODE MAPPING;  Surgeon:  Harriette Bouillon, MD;  Location: [facility] OR;  Service: General;  Laterality: Left;  PEC BLOCK   CATARACT EXTRACTION Bilateral    2021, 2022   COLONOSCOPY  2023   PLEURAL SCARIFICATION     ruptured disk     torn tendon     right arm   TUBAL LIGATION     Patient Active Problem List   Diagnosis Date Noted   Genetic testing 07/11/2023   Malignant neoplasm of upper-outer quadrant of left breast in female, estrogen receptor negative (HCC) 06/30/2023   Cervical spondylosis with radiculopathy 09/23/2022   Elevated low-density lipoprotein level 06/14/2022   Coronary arteriosclerosis 06/14/2022   Adverse reaction to antihyperlipidemic drug, initial encounter 06/14/2022   Diarrhea 01/27/2019   Change in bowel habits 01/05/2019   Left inguinal hernia 06/27/2014   Bloating 05/17/2013   Constipation 05/17/2013   Pneumothorax 10/17/2008   HIATAL HERNIA 10/17/2008   DIVERTICULOSIS, COLON 10/17/2008   ENDOMETRIOSIS 10/17/2008   Right lower quadrant abdominal pain 10/17/2008   DEPRESSION, HX OF 10/17/2008   History of colonic polyps 10/17/2008   PNEUMOTHORAX 10/17/2008    PCP: Dr Renne Crigler   REFERRING PROVIDER: Rachel Moulds, MD  REFERRING DIAG: Left Breast Cancer  THERAPY DIAG:  Lymphedema, not elsewhere classified  Breast pain  Malignant neoplasm of upper-outer quadrant of left breast in female, estrogen receptor negative (HCC)  ONSET DATE: 1 year ago  Rationale for Evaluation and Treatment: Rehabilitation  SUBJECTIVE:                                                                                                                                                                                           SUBJECTIVE STATEMENT:  I felt the best after Zella Ball worked on me. She marked the cords while I was sitting. The foam helps.   PERTINENT HISTORY:  Patient was diagnosed on 08/15/2023 with left grade 3 invasive ductal carcinoma breast cancer. She underwent a left lumpectomy and sentinel node  biopsy (13 negative nodes) on 07/29/2023. It is triple negative with a Ki67 of 95%. She has a history of a cervical fusion in 08/2022. She ended radiation in October 2024. She had a breast seroma aspirated of 10 cc on 01/22/2024 . She has had prior PT treatment for cording and left UE/breast swelling  PAIN:  Are you having pain?  NPRS scale: 0/10 at rest, 2/10 with arm rubbing breast or with light touch 6-7/10 Pain location: left lateral breast Pain orientation: Left and Lateral  PAIN TYPE: burning and rushing, pressure  Aggravating factors: laying on left side,arm rubbing side, touching it,raising arm Relieving factors: propping her arm, advil  PRECAUTIONS:  L UE lymphedema risk, cervical fusion C6, C7   RED FLAGS: Compression fracture: Yes: T12    WEIGHT BEARING RESTRICTIONS: No  FALLS:  Has patient fallen in last 6 months? No  LIVING ENVIRONMENT: Lives with: lives with their spouse Lives in: House/apartment Stairs: Yes; External: 5 steps; can reach both Has following equipment at home: None  OCCUPATION: locksmith  LEISURE: play cards,   HAND DOMINANCE: right   PRIOR LEVEL OF FUNCTION: Independent  PATIENT GOALS: Get rid of pain and swelling   OBJECTIVE: Note: Objective measures were completed at Evaluation unless otherwise noted.  COGNITION: Overall cognitive status: Within functional limits for tasks assessed   PALPATION: Tender lateral breast, inferior breast,axilla  OBSERVATIONS / OTHER ASSESSMENTS: generalized swelling,mild redness left breast. Nipple red and irritated looking, significant indentation on left from compression bra  SENSATION: Light touch: Deficits      POSTURE: forward head, rounded shoulders  UPPER EXTREMITY AROM/PROM:  A/PROM RIGHT   eval   Shoulder extension   Shoulder flexion 155,   Shoulder abduction 165,  Shoulder internal rotation   Shoulder external rotation     (Blank rows = not tested)  A/PROM LEFT   eval  Shoulder  extension   Shoulder flexion 153  Shoulder abduction 162  Shoulder internal rotation   Shoulder external rotation     (Blank rows = not tested)  CERVICAL AROM: All within functional limits:     UPPER EXTREMITY STRENGTH:   LYMPHEDEMA ASSESSMENTS:   SURGERY TYPE/DATE: 07/29/23 L lumpectomy and SLNB   NUMBER OF LYMPH NODES REMOVED: 0+/13  CHEMOTHERAPY: no pt declined  RADIATION:YES, completed Oct 2024  HORMONE TREATMENT: None  INFECTIONS: none   LYMPHEDEMA ASSESSMENTS:   LANDMARK RIGHT  eval  At axilla  33.4  15 cm proximal to olecranon process 31.3  10 cm proximal to olecranon process 29.5  Olecranon process 26.4  15 cm proximal to ulnar styloid process 24.2  10 cm proximal to ulnar styloid process 21.0  Just proximal to ulnar styloid process 16.8  Across hand at thumb web space 19.8  At base of 2nd digit 6.4  (Blank rows = not tested)  LANDMARK LEFT  eval LEFT 02/25/2024  At axilla  33.2 33.9  15 cm proximal to olecranon process 32.2 31.3  10 cm proximal to olecranon process 30.5 30.6  Olecranon process 27.9 27.6  15 cm proximal to ulnar styloid process 24.5 24.1  10 cm proximal to ulnar styloid process 21.5 21  Just proximal to ulnar styloid process 18.65 17.5  Across hand at thumb web space 21.0 20.5  At base of 2nd digit 6.35 6.3  (Blank rows = not tested)   FUNCTIONAL TESTS:    GAIT:WNL   L-DEX LYMPHEDEMA SCREENING: The patient was assessed using the L-Dex machine today to produce a lymphedema index baseline score. The patient will be reassessed on a regular basis (typically every 3 months) to obtain new L-Dex scores. If the score is > 6.5 points away from his/her baseline score indicating onset of subclinical lymphedema, it will be recommended to wear a compression garment for 4 weeks, 12 hours per day and then be reassessed. If the score continues to be > 6.5 points from baseline at reassessment, we will initiate lymphedema treatment. Assessing in  this manner has a 95% rate of preventing clinically significant lymphedema.  QUICK DASH SURVEY: 48%    BREAST COMPLAINTS QUESTIONNAIRE Pain:8 Heaviness:10 Swollen feeling:10 Tense Skin:8 Redness:3 Bra Print:8 Size of Pores:5 Hard feeling: 8 Total:    60 /80 A Score over 9 indicates lymphedema issues in the breast  L-DEX FLOWSHEETS - 03/04/24 1100       L-DEX LYMPHEDEMA SCREENING   Measurement Type Unilateral    L-DEX MEASUREMENT EXTREMITY Upper Extremity    POSITION  Standing    DOMINANT SIDE Right    At Risk Side Left    BASELINE SCORE (UNILATERAL) 4.5    L-DEX SCORE (UNILATERAL) 18    VALUE CHANGE (UNILAT) 13.5             The patient was assessed using the L-Dex machine today to produce a lymphedema index baseline score. The patient will be reassessed on a regular basis (typically every 3 months) to obtain new L-Dex scores. If the score is > 6.5 points away from his/her baseline score indicating onset of subclinical lymphedema, it will be recommended to wear a compression garment for 4 weeks, 12 hours per day and then be reassessed. If the score continues to be > 6.5 points from baseline at reassessment, we will initiate lymphedema treatment. Assessing in this manner has a 95% rate of preventing clinically significant lymphedema.  TREATMENT DATE:  Pt permission and consent throughout each step of examination and treatment with modification and draping if requested when working on sensitive areas 03/04/24: MFR to cording at L lateral breast, axilla and chest IASTM using boomerang tool to L pec with cocoa butter then to cording at left breast and axilla with improvements noted in facial adhesions In supine: Short neck, 5 diaphragmatic breaths, R axillary nodes and establishment of interaxillary pathway, L inguinal nodes and establishment of axilloinguinal pathway, then L  breast moving fluid towards pathways and LUE working proximal and moving distally then retracing all steps.  Repeated SOZO which was still in the red  L-DEX FLOWSHEETS - 03/04/24 1100       L-DEX LYMPHEDEMA SCREENING   Measurement Type Unilateral    L-DEX MEASUREMENT EXTREMITY Upper Extremity    POSITION  Standing    DOMINANT SIDE Right    At Risk Side Left    BASELINE SCORE (UNILATERAL) 4.5    L-DEX SCORE (UNILATERAL) 18    VALUE CHANGE (UNILAT) 13.5            The patient was assessed using the L-Dex machine today to produce a lymphedema index baseline score. The patient will be reassessed on a regular basis (typically every 3 months) to obtain new L-Dex scores. If the score is > 6.5 points away from his/her baseline score indicating onset of subclinical lymphedema, it will be recommended to wear a compression garment for 4 weeks, 12 hours per day and then be reassessed. If the score continues to be > 6.5 points from baseline at reassessment, we will initiate lymphedema treatment. Assessing in this manner has a 95% rate of preventing clinically significant lymphedema.   02/26/2023 02/27/2024 MFR to right axillary, upper arm, and forearm cording, and briefly to left breast cording running vertically from clavicle, and horizontally across breast In supine: Short neck, 5 diaphragmatic breaths, R axillary nodes and establishment of interaxillary pathway, L inguinal nodes and establishment of axilloinguinal pathway, then L breast moving fluid towards pathways spending extra time in any areas of fibrosis then retracing all steps then ending with LN's.  Small piece of comprilan soft foam made for medial arm sleeve under silicone. Peach dot foam in stockinette made and placed at dorsum of hand, but will be very difficult for pt to get in herself. Advised she can try with flat side down or dot side down but to only wear if comfortable. Advised to remove sleeve in afternoon if it is uncomfortable,  and replace in an hour or so after arm rests. Repeat SOZO next   02/25/2024 Measured left UE with some reduction noted Pt getting discomfort at top of sleeve and swelling top of hand with glove. Possibly sleeve laying on tight cording noted there;made small piece of 1/4 in foam to put under top band of sleeve. Pt will put glove on and off to try and avoid dorsum of hand swelling. Maybe foam pad in glove would help dorsum of hand. Lowered strap on bra to try and get axillary part from pinching at her armpit. In supine: Short neck, 5 diaphragmatic breaths, R axillary nodes and establishment of interaxillary pathway, L inguinal nodes and establishment of axilloinguinal pathway, then L breast moving fluid towards pathways spending extra time in any areas of fibrosis then retracing all steps then ending with LN's. Did some in right SL to left lateral breast and AI pathway and ended with LN's MFR to left axillary cording. Showed pt shoulder extension  with wrist ext to stretch cords 02/16/24: Checked fit of sleeve and glove and they seem okay - not too tight  In supine: Short neck, 5 diaphragmatic breaths, R axillary nodes and establishment of interaxillary pathway, L inguinal nodes and establishment of axilloinguinal pathway, then L breast moving fluid towards pathways spending extra time in any areas of fibrosis then retracing all steps then LUE working proximal to distal and retracing all steps.  Gentle soft tissue mobilization to L lumpectomy scar  02/11/24: In supine: Short neck, 5 diaphragmatic breaths, R axillary nodes and establishment of interaxillary pathway, L inguinal nodes and establishment of axilloinguinal pathway, then L breast moving fluid towards pathways spending extra time in any areas of fibrosis then retracing all steps then LUE working proximal to distal and retracing all steps.  Gentle soft tissue mobilization to L lumpectomy scar At end of session pt was able to palpate a vertical  cord near axillary/breast fold where pt has increased pain so began gentle MFR to this area  02/04/2024 Discussed POC, LOS, treatment interventions. Had Aiya Keach also check redness at breast;agrees no infection. Gave script for pt to look into getting a different compression bra at [facility] that might fit better. Pt to bring arm and hand garments next visit.    PATIENT EDUCATION:  Education details:POC, LOS, treatment interventions. Had Casmira Cramer also check redness at breast;agrees no infection. Gave script for pt to look into getting a different compression bra at [facility] [place] [place]. Dorothyann Gibbs also spoke to her about a Teacher, early years/pre. Person educated: Patient Education method: Explanation Education comprehension: verbalized understanding  HOME EXERCISE PROGRAM:   ASSESSMENT:  CLINICAL IMPRESSION: Repeated SOZO today which was still in the red indicating lymphedema. Pt reports her cording felt much better after last session. Therapist marked cording in seated position to help find them when pt is in supine. Performed MFR and IASTM along all cords today. There was improvement noted in fascial adhesions by end of session. Pt reports improvement in discomfort at end of session. Performed MLD at end to help with lymphedema.    OBJECTIVE IMPAIRMENTS: decreased activity tolerance, decreased knowledge of condition, increased edema, impaired UE functional use, postural dysfunction, and pain.   ACTIVITY LIMITATIONS: sleeping, bed mobility, and reach over head  PARTICIPATION LIMITATIONS:  anything requiring use of her arm to reach in certain directions putting pressure on breast  PERSONAL FACTORS: 1-2 comorbidities: Left triple negative breast cancer s/p radiation  are also affecting patient's functional outcome.   REHAB POTENTIAL: Good  CLINICAL DECISION MAKING: Evolving/moderate complexity  EVALUATION COMPLEXITY: Moderate  GOALS: Goals reviewed with patient? Yes  SHORT TERM GOALS Target date:  02/25/2024   1.  Pt will have decreased Left breast pain by 25%% Baseline:  Goal status: INITIAL  2.  Pt will have decreased breast complaints questionnaire score to 40/80 Baseline:  Goal status: INITIAL  3.  Pt will be compliant with compression bra/vest to decrease swelling Baseline:  Goal status: INITIAL  4.  Pt will have new compression garments for arm/hand prn Baseline:  Goal status: INITIAL  LONG TERM GOALS: Target date: 03/17/2024  Pt will have decreased breast pain by 50% for improved activity tolerance Baseline:  Goal status: INITIAL  2.  Pt will be independent with MLD to left breast and UE prn to decrease swelling Baseline:  Goal status: INITIAL  3.  Breast complaints questionnaire will be decreased to no greater than 25 to demonstrate improved swelling Baseline:  Goal status: INITIAL  4.  Pt will be able to sleep on her left side without left breast pain Baseline:  Goal status: INITIAL  5.  Pt will be tolerant of UE compression garments and will note decreased edema throughout Baseline:  Goal status: INITIAL    PLAN:  PT FREQUENCY: 2x/week  PT DURATION: 6 weeks  PLANNED INTERVENTIONS: 97164- PT Re-evaluation, 97110-Therapeutic exercises, 97530- Therapeutic activity, 97112- Neuromuscular re-education, 97535- Self Care, [id number]- Manual therapy, and 97760- Orthotic Fit/training  PLAN FOR NEXT SESSION: how as IASTM? MFR to breast cord and axillary cording, breast MLD, UE MLD, continue to modify garments prn  [employer], PT 03/04/2024, 11:59 AM

## 2024-03-08 ENCOUNTER — Ambulatory Visit: Payer: Medicare Other

## 2024-03-08 DIAGNOSIS — I890 Lymphedema, not elsewhere classified: Secondary | ICD-10-CM | POA: Diagnosis not present

## 2024-03-08 DIAGNOSIS — R293 Abnormal posture: Secondary | ICD-10-CM | POA: Diagnosis not present

## 2024-03-08 DIAGNOSIS — Z171 Estrogen receptor negative status [ER-]: Secondary | ICD-10-CM | POA: Diagnosis not present

## 2024-03-08 DIAGNOSIS — R600 Localized edema: Secondary | ICD-10-CM

## 2024-03-08 DIAGNOSIS — M25612 Stiffness of left shoulder, not elsewhere classified: Secondary | ICD-10-CM

## 2024-03-08 DIAGNOSIS — H2513 Age-related nuclear cataract, bilateral: Secondary | ICD-10-CM | POA: Diagnosis not present

## 2024-03-08 DIAGNOSIS — Z483 Aftercare following surgery for neoplasm: Secondary | ICD-10-CM

## 2024-03-08 DIAGNOSIS — C50412 Malignant neoplasm of upper-outer quadrant of left female breast: Secondary | ICD-10-CM

## 2024-03-08 DIAGNOSIS — N644 Mastodynia: Secondary | ICD-10-CM | POA: Diagnosis not present

## 2024-03-08 NOTE — Therapy (Signed)
 OUTPATIENT PHYSICAL THERAPY  UPPER EXTREMITY ONCOLOGY  Patient Name: [name] [name] MRN: [id number] DOB:02/08/1951, 73 y.o., female Today's Date: 03/08/2024  END OF SESSION:  PT End of Session - 03/08/24 1056     Visit Number 7    Number of Visits 12    Date for PT Re-Evaluation 03/17/24    PT Start Time 1100    PT Stop Time 1206    PT Time Calculation (min) 66 min    Activity Tolerance Patient tolerated treatment well    Behavior During Therapy WFL for tasks assessed/performed              Past Medical History:  Diagnosis Date   Adenomatous colon polyp    Allergy    Anxiety    Arthritis    bilateral hands. lower back   Cancer [facility])    skin   Cataract    bilateral   COPD (chronic obstructive pulmonary disease) (HCC)    Depression    Diverticulosis    Endometriosis    GERD (gastroesophageal reflux disease)    Glaucoma    Headache    Migraines   Hiatal hernia    History of left breast cancer 05/2023   History of radiation therapy    Left breast- 09/25/23-10/17/23- Dr. Antony Blackbird   IBS (irritable bowel syndrome)    Inguinal hernia    Pneumothorax 1987   PONV (postoperative nausea and vomiting)    Past Surgical History:  Procedure Laterality Date   ABDOMINAL HYSTERECTOMY  1992   ANTERIOR CERVICAL DECOMP/DISCECTOMY FUSION N/A 09/23/2022   Procedure: Anterior Decompression Fusion,PLATE/SCREWS Cervical five-six; REMOVAL OLD PLATE;  Surgeon: Tressie Stalker, MD;  Location: [facility] OR;  Service: Neurosurgery;  Laterality: N/A;   APPENDECTOMY  1992   BACK SURGERY     cervical   BREAST BIOPSY Left 06/23/2023   [place] LT BREAST BX W LOC DEV 1ST LESION IMG BX SPEC US GUIDE 06/23/2023 [facility]   BREAST BIOPSY Left 07/28/2023   [place] LT RADIOACTIVE SEED LOC 07/28/2023 [facility]   BREAST LUMPECTOMY WITH RADIOACTIVE SEED AND SENTINEL LYMPH NODE BIOPSY Left 07/29/2023   Procedure: LEFT BREAST SEED LUMPECTOMY WITH LEFT SENTINEL LYMPH NODE MAPPING;  Surgeon:  Harriette Bouillon, MD;  Location: [facility] OR;  Service: General;  Laterality: Left;  PEC BLOCK   CATARACT EXTRACTION Bilateral    2021, 2022   COLONOSCOPY  2023   PLEURAL SCARIFICATION     ruptured disk     torn tendon     right arm   TUBAL LIGATION     Patient Active Problem List   Diagnosis Date Noted   Genetic testing 07/11/2023   Malignant neoplasm of upper-outer quadrant of left breast in female, estrogen receptor negative (HCC) 06/30/2023   Cervical spondylosis with radiculopathy 09/23/2022   Elevated low-density lipoprotein level 06/14/2022   Coronary arteriosclerosis 06/14/2022   Adverse reaction to antihyperlipidemic drug, initial encounter 06/14/2022   Diarrhea 01/27/2019   Change in bowel habits 01/05/2019   Left inguinal hernia 06/27/2014   Bloating 05/17/2013   Constipation 05/17/2013   Pneumothorax 10/17/2008   HIATAL HERNIA 10/17/2008   DIVERTICULOSIS, COLON 10/17/2008   ENDOMETRIOSIS 10/17/2008   Right lower quadrant abdominal pain 10/17/2008   DEPRESSION, HX OF 10/17/2008   History of colonic polyps 10/17/2008   PNEUMOTHORAX 10/17/2008    PCP: Dr Renne Crigler   REFERRING PROVIDER: Rachel Moulds, MD  REFERRING DIAG: Left Breast Cancer  THERAPY DIAG:  Lymphedema, not elsewhere classified  Breast pain  Malignant neoplasm of upper-outer quadrant of left breast in female, estrogen receptor negative (HCC)  Aftercare following surgery for neoplasm  Abnormal posture  Stiffness of left shoulder, not elsewhere classified  Localized edema  ONSET DATE: 1 year ago  Rationale for Evaluation and Treatment: Rehabilitation  SUBJECTIVE:                                                                                                                                                                                           SUBJECTIVE STATEMENT:   I have had a terrible weekend all over. I was in agony after last visit.  I have a new problem . My left lateral trunk is very  swollen and I can tell with my arm at its side. The 4th finger stays purple and the glove is not comfortable. I think there is a small blister on the 4th finger.  PERTINENT HISTORY:  Patient was diagnosed on 08/15/2023 with left grade 3 invasive ductal carcinoma breast cancer. She underwent a left lumpectomy and sentinel node biopsy (13 negative nodes) on 07/29/2023. It is triple negative with a Ki67 of 95%. She has a history of a cervical fusion in 08/2022. She ended radiation in October 2024. She had a breast seroma aspirated of 10 cc on 01/22/2024 . She has had prior PT treatment for cording and left UE/breast swelling  PAIN:  Are you having pain?  NPRS scale: 0/10 at rest, 4-5/10 with arm rubbing breast or with light touch 7-8/10 from sleeve Pain location: left lateral breast Pain orientation: Left and Lateral  PAIN TYPE: burning and rushing, pressure  Aggravating factors: laying on left side,arm rubbing side, touching it,raising arm Relieving factors: propping her arm, advil  PRECAUTIONS:  L UE lymphedema risk, cervical fusion C6, C7   RED FLAGS: Compression fracture: Yes: T12    WEIGHT BEARING RESTRICTIONS: No  FALLS:  Has patient fallen in last 6 months? No  LIVING ENVIRONMENT: Lives with: lives with their spouse Lives in: House/apartment Stairs: Yes; External: 5 steps; can reach both Has following equipment at home: None  OCCUPATION: locksmith  LEISURE: play cards,   HAND DOMINANCE: right   PRIOR LEVEL OF FUNCTION: Independent  PATIENT GOALS: Get rid of pain and swelling   OBJECTIVE: Note: Objective measures were completed at Evaluation unless otherwise noted.  COGNITION: Overall cognitive status: Within functional limits for tasks assessed   PALPATION: Tender lateral breast, inferior breast,axilla  OBSERVATIONS / OTHER ASSESSMENTS: generalized swelling,mild redness left breast. Nipple red and irritated looking, significant indentation on left from compression  bra  SENSATION: Light touch: Deficits      POSTURE: forward  head, rounded shoulders  UPPER EXTREMITY AROM/PROM:  A/PROM RIGHT   eval   Shoulder extension   Shoulder flexion 155,   Shoulder abduction 165,  Shoulder internal rotation   Shoulder external rotation     (Blank rows = not tested)  A/PROM LEFT   eval  Shoulder extension   Shoulder flexion 153  Shoulder abduction 162  Shoulder internal rotation   Shoulder external rotation     (Blank rows = not tested)  CERVICAL AROM: All within functional limits:     UPPER EXTREMITY STRENGTH:   LYMPHEDEMA ASSESSMENTS:   SURGERY TYPE/DATE: 07/29/23 L lumpectomy and SLNB   NUMBER OF LYMPH NODES REMOVED: 0+/13  CHEMOTHERAPY: no pt declined  RADIATION:YES, completed Oct 2024  HORMONE TREATMENT: None  INFECTIONS: none   LYMPHEDEMA ASSESSMENTS:   LANDMARK RIGHT  eval  At axilla  33.4  15 cm proximal to olecranon process 31.3  10 cm proximal to olecranon process 29.5  Olecranon process 26.4  15 cm proximal to ulnar styloid process 24.2  10 cm proximal to ulnar styloid process 21.0  Just proximal to ulnar styloid process 16.8  Across hand at thumb web space 19.8  At base of 2nd digit 6.4  (Blank rows = not tested)  LANDMARK LEFT  eval LEFT 02/25/2024 LEFT 03/08/2024  At axilla  33.2 33.9 32.5  15 cm proximal to olecranon process 32.2 31.3   10 cm proximal to olecranon process 30.5 30.6 30.5  Olecranon process 27.9 27.6 27.4  15 cm proximal to ulnar styloid process 24.5 24.1 24.3  10 cm proximal to ulnar styloid process 21.5 21 21.0  Just proximal to ulnar styloid process 18.65 17.5 17.7  Across hand at thumb web space 21.0 20.5   At base of 2nd digit 6.35 6.3 6.3  (Blank rows = not tested)   FUNCTIONAL TESTS:    GAIT:WNL   L-DEX LYMPHEDEMA SCREENING: The patient was assessed using the L-Dex machine today to produce a lymphedema index baseline score. The patient will be reassessed on a regular  basis (typically every 3 months) to obtain new L-Dex scores. If the score is > 6.5 points away from his/her baseline score indicating onset of subclinical lymphedema, it will be recommended to wear a compression garment for 4 weeks, 12 hours per day and then be reassessed. If the score continues to be > 6.5 points from baseline at reassessment, we will initiate lymphedema treatment. Assessing in this manner has a 95% rate of preventing clinically significant lymphedema.  QUICK DASH SURVEY: 48%    BREAST COMPLAINTS QUESTIONNAIRE Pain:8 Heaviness:10 Swollen feeling:10 Tense Skin:8 Redness:3 Bra Print:8 Size of Pores:5 Hard feeling: 8 Total:    60 /80 A Score over 9 indicates lymphedema issues in the breast    The patient was assessed using the L-Dex machine today to produce a lymphedema index baseline score. The patient will be reassessed on a regular basis (typically every 3 months) to obtain new L-Dex scores. If the score is > 6.5 points away from his/her baseline score indicating onset of subclinical lymphedema, it will be recommended to wear a compression garment for 4 weeks, 12 hours per day and then be reassessed. If the score continues to be > 6.5 points from baseline at reassessment, we will initiate lymphedema treatment. Assessing in this manner has a 95% rate of preventing clinically significant lymphedema.  TREATMENT DATE:  Pt permission and consent throughout each step of examination and treatment with modification and draping if requested when working on sensitive areas  03/08/2024 Measured left arm MFR to right axillary, upper arm, cording, and briefly to left breast cording running vertically from clavicle, and horizontally across breast In supine: Short neck, 5 diaphragmatic breaths, R axillary nodes and establishment of interaxillary pathway, L inguinal nodes and  establishment of axilloinguinal pathway, then L breast moving fluid towards pathways, then left UE outer arm, medial to lateral, outer arm again spending extra time in any areas of fibrosis then retracing pathways and  ending with LN's.  Discussed another sleeve/glove with pt; consider Medi Espirit due to fabric made more for sensitive skin per [place].  Gave pt some more thin comprilan foam to place under silicone border and discussed wearing sleeve for just a few hours at a time and taking glove off more frequently as needed  to prevent finger swelling/pain. Also discussed stretching end of fingers with a large marker or similar. Discussed bandaging as an option as well, and thought about doing it today but was hopeful she could meet with Rodman Pickle today. Rodman Pickle had other appts as did pt so I spoke with Rodman Pickle only  03/04/24: MFR to cording at L lateral breast, axilla and chest IASTM using boomerang tool to L pec with cocoa butter then to cording at left breast and axilla with improvements noted in facial adhesions In supine: Short neck, 5 diaphragmatic breaths, R axillary nodes and establishment of interaxillary pathway, L inguinal nodes and establishment of axilloinguinal pathway, then L breast moving fluid towards pathways and LUE working proximal and moving distally then retracing all steps.  Repeated SOZO which was still in the red   The patient was assessed using the L-Dex machine today to produce a lymphedema index baseline score. The patient will be reassessed on a regular basis (typically every 3 months) to obtain new L-Dex scores. If the score is > 6.5 points away from his/her baseline score indicating onset of subclinical lymphedema, it will be recommended to wear a compression garment for 4 weeks, 12 hours per day and then be reassessed. If the score continues to be > 6.5 points from baseline at reassessment, we will initiate lymphedema treatment. Assessing in this manner has a 95% rate of  preventing clinically significant lymphedema.   02/26/2023 02/27/2024 MFR to right axillary, upper arm, and forearm cording, and briefly to left breast cording running vertically from clavicle, and horizontally across breast In supine: Short neck, 5 diaphragmatic breaths, R axillary nodes and establishment of interaxillary pathway, L inguinal nodes and establishment of axilloinguinal pathway, then L breast moving fluid towards pathways spending extra time in any areas of fibrosis then retracing all steps then ending with LN's.  Small piece of comprilan soft foam made for medial arm sleeve under silicone. Peach dot foam in stockinette made and placed at dorsum of hand, but will be very difficult for pt to get in herself. Advised she can try with flat side down or dot side down but to only wear if comfortable. Advised to remove sleeve in afternoon if it is uncomfortable, and replace in an hour or so after arm rests. Repeat SOZO next   02/25/2024 Measured left UE with some reduction noted Pt getting discomfort at top of sleeve and swelling top of hand with glove. Possibly sleeve laying on tight cording noted there;made small piece of 1/4 in foam to put under top band of sleeve.  Pt will put glove on and off to try and avoid dorsum of hand swelling. Maybe foam pad in glove would help dorsum of hand. Lowered strap on bra to try and get axillary part from pinching at her armpit. In supine: Short neck, 5 diaphragmatic breaths, R axillary nodes and establishment of interaxillary pathway, L inguinal nodes and establishment of axilloinguinal pathway, then L breast moving fluid towards pathways spending extra time in any areas of fibrosis then retracing all steps then ending with LN's. Did some in right SL to left lateral breast and AI pathway and ended with LN's MFR to left axillary cording. Showed pt shoulder extension with wrist ext to stretch cords 02/16/24: Checked fit of sleeve and glove and they seem okay -  not too tight  In supine: Short neck, 5 diaphragmatic breaths, R axillary nodes and establishment of interaxillary pathway, L inguinal nodes and establishment of axilloinguinal pathway, then L breast moving fluid towards pathways spending extra time in any areas of fibrosis then retracing all steps then LUE working proximal to distal and retracing all steps.  Gentle soft tissue mobilization to L lumpectomy scar  02/11/24: In supine: Short neck, 5 diaphragmatic breaths, R axillary nodes and establishment of interaxillary pathway, L inguinal nodes and establishment of axilloinguinal pathway, then L breast moving fluid towards pathways spending extra time in any areas of fibrosis then retracing all steps then LUE working proximal to distal and retracing all steps.  Gentle soft tissue mobilization to L lumpectomy scar At end of session pt was able to palpate a vertical cord near axillary/breast fold where pt has increased pain so began gentle MFR to this area  02/04/2024 Discussed POC, LOS, treatment interventions. Had Blaire also check redness at breast;agrees no infection. Gave script for pt to look into getting a different compression bra at [facility] [place] that might fit better. Pt to bring arm and hand garments next visit.    PATIENT EDUCATION:  Education details:POC, LOS, treatment interventions. Had Blaire also check redness at breast;agrees no infection. Gave script for pt to look into getting a different compression bra at [facility] [place]. Dorothyann Gibbs also spoke to her about a Teacher, early years/pre. Person educated: Patient Education method: Explanation Education comprehension: verbalized understanding  HOME EXERCISE PROGRAM:   ASSESSMENT:  CLINICAL IMPRESSION:  Pt reported being in agony after previous visit so did not do IASTM. Focus on MFR techniques to cording and MLD. Pt reported feeling better and no having pain in axillary region or lateral breast after treatment. She is having issudes with the sleeve  discomfort at the top possibly due to cording and with the fingers on the glove. She will continue to try to wear but will remove more frequently before pain starts and will try and stretch the fingers on the glove. She did have several large ridges on some of her fingers from the glove.  Will consider Medi Espirit Sleeve/new glove or bandaging prn   OBJECTIVE IMPAIRMENTS: decreased activity tolerance, decreased knowledge of condition, increased edema, impaired UE functional use, postural dysfunction, and pain.   ACTIVITY LIMITATIONS: sleeping, bed mobility, and reach over head  PARTICIPATION LIMITATIONS:  anything requiring use of her arm to reach in certain directions putting pressure on breast  PERSONAL FACTORS: 1-2 comorbidities: Left triple negative breast cancer s/p radiation  are also affecting patient's functional outcome.   REHAB POTENTIAL: Good  CLINICAL DECISION MAKING: Evolving/moderate complexity  EVALUATION COMPLEXITY: Moderate  GOALS: Goals reviewed with patient? Yes  SHORT TERM GOALS  Target date: 02/25/2024   1.  Pt will have decreased Left breast pain by 25%% Baseline:  Goal status: INITIAL  2.  Pt will have decreased breast complaints questionnaire score to 40/80 Baseline:  Goal status: INITIAL  3.  Pt will be compliant with compression bra/vest to decrease swelling Baseline:  Goal status: INITIAL  4.  Pt will have new compression garments for arm/hand prn Baseline:  Goal status: INITIAL  LONG TERM GOALS: Target date: 03/17/2024  Pt will have decreased breast pain by 50% for improved activity tolerance Baseline:  Goal status: INITIAL  2.  Pt will be independent with MLD to left breast and UE prn to decrease swelling Baseline:  Goal status: INITIAL  3.  Breast complaints questionnaire will be decreased to no greater than 25 to demonstrate improved swelling Baseline:  Goal status: INITIAL  4.  Pt will be able to sleep on her left side without left  breast pain Baseline:  Goal status: INITIAL  5.  Pt will be tolerant of UE compression garments and will note decreased edema throughout Baseline:  Goal status: INITIAL    PLAN:  PT FREQUENCY: 2x/week  PT DURATION: 6 weeks  PLANNED INTERVENTIONS: 97164- PT Re-evaluation, 97110-Therapeutic exercises, 97530- Therapeutic activity, 97112- Neuromuscular re-education, 97535- Self Care, 84696- Manual therapy, and 97760- Orthotic Fit/training  PLAN FOR NEXT SESSION: do not do IASTM, MFR to breast cord and axillary cording, breast MLD, UE MLD, continue to modify garments prn;consider Medi Espirit and adifferent glove if she is not able to get hers to be comfortable for her.  Waynette Buttery, PT 03/08/2024, 1:26 PM

## 2024-03-09 ENCOUNTER — Ambulatory Visit: Payer: Medicare Other | Admitting: Internal Medicine

## 2024-03-09 ENCOUNTER — Encounter: Payer: Self-pay | Admitting: Internal Medicine

## 2024-03-09 VITALS — BP 123/79 | HR 72 | Temp 98.0°F | Ht 69.5 in | Wt 215.6 lb

## 2024-03-09 DIAGNOSIS — Z853 Personal history of malignant neoplasm of breast: Secondary | ICD-10-CM

## 2024-03-09 DIAGNOSIS — J449 Chronic obstructive pulmonary disease, unspecified: Secondary | ICD-10-CM

## 2024-03-09 DIAGNOSIS — R918 Other nonspecific abnormal finding of lung field: Secondary | ICD-10-CM

## 2024-03-09 DIAGNOSIS — Z87891 Personal history of nicotine dependence: Secondary | ICD-10-CM

## 2024-03-09 MED ORDER — CETIRIZINE HCL 10 MG PO TABS: 10.0000 mg | ORAL_TABLET | Freq: Every day | ORAL | 11 refills | Status: AC

## 2024-03-09 MED ORDER — ALBUTEROL SULFATE HFA 108 (90 BASE) MCG/ACT IN AERS: 2.0000 | INHALATION_SPRAY | Freq: Four times a day (QID) | RESPIRATORY_TRACT | 5 refills | Status: DC | PRN

## 2024-03-09 NOTE — Patient Instructions (Addendum)
 It was a pleasure to see you today!  Please schedule follow up scheduled with myself in 4 months.  If my schedule is not open yet, we will contact you with a reminder closer to that time. Please call [phone] if you haven't heard from [place] a month before, and always call us sooner if issues or concerns arise. You can also send [place] a message through MyChart, but but aware that this is not to be used for urgent issues and it may take up to 5-7 days to receive a reply. Please be aware that you will likely be able to view your results before I have a chance to respond to them. Please give [place] 5 business days to respond to any non-urgent results.    Before your next visit I would like you to have: CT Chest to follow up on the lung nodules in June 2025, follow up with me after this  I have refilled the albuterol - take as needed for shortness of breath.   I have refilled cetirizine - to take once daily for seasonal environmental allergies.   Good luck with your lymphedema.

## 2024-03-09 NOTE — Progress Notes (Signed)
 [name] [name]    [id number]    March 10, 1951  Primary Care Physician:Pharr, Zollie Beckers, MD Date of Appointment: 03/09/2024 Established Patient Visit  Chief complaint:   Chief Complaint  Patient presents with   Follow-up    No complaints.  Breathing is about the same.    HPI: [name] [name] is a 73 y.o. woman with history of tobacco use(quit 2013), copd, and seasonal allergic rhinitis. Also with T2N0 triple negative  DCIS of the left breast and s/p lympectomy, lymph node dissection and radiation. She elected not to do chemotherapy.   Interval Updates: Struggling with lymphedema following her breast cancer treatment.   Breathing is doing ok off inhaler.  No issues.   Allergies are ok right now but expecting them to worsen in the spring.     I have reviewed the patient's family social and past medical history and updated as appropriate.   Past Medical History:  Diagnosis Date   Adenomatous colon polyp    Allergy    Anxiety    Arthritis    bilateral hands. lower back   Cancer [facility])    skin   Cataract    bilateral   COPD (chronic obstructive pulmonary disease) (HCC)    Depression    Diverticulosis    Endometriosis    GERD (gastroesophageal reflux disease)    Glaucoma    Headache    Migraines   Hiatal hernia    History of left breast cancer 05/2023   History of radiation therapy    Left breast- 09/25/23-10/17/23- Dr. Antony Blackbird   IBS (irritable bowel syndrome)    Inguinal hernia    Pneumothorax 1987   PONV (postoperative nausea and vomiting)     Past Surgical History:  Procedure Laterality Date   ABDOMINAL HYSTERECTOMY  1992   ANTERIOR CERVICAL DECOMP/DISCECTOMY FUSION N/A 09/23/2022   Procedure: Anterior Decompression Fusion,PLATE/SCREWS Cervical five-six; REMOVAL OLD PLATE;  Surgeon: Tressie Stalker, MD;  Location: [facility] OR;  Service: Neurosurgery;  Laterality: N/A;   APPENDECTOMY  1992   BACK SURGERY     cervical   BREAST BIOPSY Left 06/23/2023    [place] LT BREAST BX W LOC DEV 1ST LESION IMG BX SPEC US GUIDE 06/23/2023 [facility]   BREAST BIOPSY Left 07/28/2023   [place] LT RADIOACTIVE SEED LOC 07/28/2023 [facility]   BREAST LUMPECTOMY WITH RADIOACTIVE SEED AND SENTINEL LYMPH NODE BIOPSY Left 07/29/2023   Procedure: LEFT BREAST SEED LUMPECTOMY WITH LEFT SENTINEL LYMPH NODE MAPPING;  Surgeon: Harriette Bouillon, MD;  Location: [facility] OR;  Service: General;  Laterality: Left;  PEC BLOCK   CATARACT EXTRACTION Bilateral    2021, 2022   COLONOSCOPY  2023   PLEURAL SCARIFICATION     ruptured disk     torn tendon     right arm   TUBAL LIGATION      Family History  Problem Relation Age of Onset   Heart failure Mother    Crohn's disease Sister    Osteoporosis Sister    Neuropathy Sister    Colon polyps Sister    Lung cancer Sister 75       smoked   Thyroid disease Sister    Thyroid cancer Maternal Grandmother    Colon cancer Neg Hx    Breast cancer Neg Hx    Esophageal cancer Neg Hx    Pancreatic cancer Neg Hx    Stomach cancer Neg Hx     Social History  Occupational History   Occupation: locksmith  Tobacco Use   Smoking status: Former    Current packs/day: 0.00    Average packs/day: 1.5 packs/day for 40.1 years (60.2 ttl pk-yrs)    Types: Cigarettes    Start date: 12/31/1971    Quit date: 02/10/2012    Years since quitting: 12.0   Smokeless tobacco: Never  Vaping Use   Vaping status: Never Used  Substance and Sexual Activity   Alcohol use: No   Drug use: No   Sexual activity: Never     Physical Exam: Blood pressure 123/79, pulse 72, temperature 98 F (36.7 C), temperature source Oral, height 5' 9.5" (1.765 m), weight 215 lb 9.6 oz (97.8 kg), SpO2 94%.  Gen:     NAD, well developed well nourished Lungs:  ctab no wheezes or crackles CV:         RRR no mrg Ext: Left upper extremity lymphedema    Data Reviewed: Imaging: I have personally reviewed the CT Chest from Oct 2022 with stable 6mm pulmonary nodule.    PFTs: None on file.  Labs: Lab Results  Component Value Date   WBC 6.3 12/29/2023   HGB 13.6 12/29/2023   HCT 42.0 12/29/2023   MCV 92.7 12/29/2023   PLT 265 12/29/2023   Lab Results  Component Value Date   NA 141 12/29/2023   K 4.6 12/29/2023   CL 105 12/29/2023   CO2 29 12/29/2023    Immunization status: Immunization History  Administered Date(s) Administered   Influenza, High Dose Seasonal PF 08/28/2016, 10/16/2018, 09/16/2019, 09/18/2020   Influenza, Quadrivalent, Recombinant, Inj, Pf 10/10/2017, 10/16/2018, 09/15/2020, 10/02/2021   Influenza, Seasonal, Injecte, Preservative Fre 09/13/2014   Influenza-Unspecified 09/22/2012, 09/29/2018, 09/15/2020, 10/02/2021, 09/11/2022   PFIZER(Purple Top)SARS-COV-2 Vaccination 01/21/2020, 02/11/2020   Pneumococcal Conjugate-13 03/20/2017   Pneumococcal Polysaccharide-23 03/30/2018   Td 12/26/2014   Zoster Recombinant(Shingrix) 09/22/2019   Zoster, Live 03/01/2015    Assessment:  COPD moderate FEV1 65% of predicted, controlled Seasonal allergic rhinitis GERD controlled Need for lung cancer screening Multiple pulmonary nodules Triple negative Left DCIS, s/p lumpectomy, radiation  Plan/Recommendations: She does not qualify for lung cancer screening per protocol due to the history of recent breast cancer. .   CT Chest to follow up on the lung nodules in June 2025, follow up with me after this. After that can monitor nodules q6 months or q12 months depending on size and rate of growth.   I have refilled the albuterol - take as needed for shortness of breath.   I have refilled cetirizine - to take once daily for seasonal environmental allergies.    Return to Care: Return in about 4 months (around 07/09/2024).   Durel Salts, MD Pulmonary and Critical Care Medicine [facility] Office:[phone]

## 2024-03-10 ENCOUNTER — Ambulatory Visit: Payer: Medicare Other

## 2024-03-11 ENCOUNTER — Encounter: Payer: Self-pay | Admitting: Physical Therapy

## 2024-03-11 ENCOUNTER — Ambulatory Visit: Admitting: Physical Therapy

## 2024-03-11 DIAGNOSIS — I890 Lymphedema, not elsewhere classified: Secondary | ICD-10-CM | POA: Diagnosis not present

## 2024-03-11 DIAGNOSIS — M25612 Stiffness of left shoulder, not elsewhere classified: Secondary | ICD-10-CM | POA: Diagnosis not present

## 2024-03-11 DIAGNOSIS — Z483 Aftercare following surgery for neoplasm: Secondary | ICD-10-CM | POA: Diagnosis not present

## 2024-03-11 DIAGNOSIS — R600 Localized edema: Secondary | ICD-10-CM | POA: Diagnosis not present

## 2024-03-11 DIAGNOSIS — C50412 Malignant neoplasm of upper-outer quadrant of left female breast: Secondary | ICD-10-CM

## 2024-03-11 DIAGNOSIS — N644 Mastodynia: Secondary | ICD-10-CM | POA: Diagnosis not present

## 2024-03-11 DIAGNOSIS — R293 Abnormal posture: Secondary | ICD-10-CM | POA: Diagnosis not present

## 2024-03-11 DIAGNOSIS — Z171 Estrogen receptor negative status [ER-]: Secondary | ICD-10-CM | POA: Diagnosis not present

## 2024-03-11 NOTE — Therapy (Signed)
 OUTPATIENT PHYSICAL THERAPY  UPPER EXTREMITY ONCOLOGY  Patient Name: [name] [name] MRN: [id number] DOB:06/24/1951, 73 y.o., female Today's Date: 03/11/2024  END OF SESSION:  PT End of Session - 03/11/24 1505     Visit Number 8    Number of Visits 12    Date for PT Re-Evaluation 03/17/24    PT Start Time 1503    PT Stop Time 1558    PT Time Calculation (min) 55 min    Activity Tolerance Patient tolerated treatment well    Behavior During Therapy [facility] for tasks assessed/performed              Past Medical History:  Diagnosis Date   Adenomatous colon polyp    Allergy    Anxiety    Arthritis    bilateral hands. lower back   Cancer [facility])    skin   Cataract    bilateral   COPD (chronic obstructive pulmonary disease) (HCC)    Depression    Diverticulosis    Endometriosis    GERD (gastroesophageal reflux disease)    Glaucoma    Headache    Migraines   Hiatal hernia    History of left breast cancer 05/2023   History of radiation therapy    Left breast- 09/25/23-10/17/23- Dr. Antony Blackbird   IBS (irritable bowel syndrome)    Inguinal hernia    Pneumothorax 1987   PONV (postoperative nausea and vomiting)    Past Surgical History:  Procedure Laterality Date   ABDOMINAL HYSTERECTOMY  1992   ANTERIOR CERVICAL DECOMP/DISCECTOMY FUSION N/A 09/23/2022   Procedure: Anterior Decompression Fusion,PLATE/SCREWS Cervical five-six; REMOVAL OLD PLATE;  Surgeon: Tressie Stalker, MD;  Location: [facility] OR;  Service: Neurosurgery;  Laterality: N/A;   APPENDECTOMY  1992   BACK SURGERY     cervical   BREAST BIOPSY Left 06/23/2023   [place] LT BREAST BX W LOC DEV 1ST LESION IMG BX SPEC US GUIDE 06/23/2023 [facility]   BREAST BIOPSY Left 07/28/2023   [place] LT RADIOACTIVE SEED LOC 07/28/2023 [facility]   BREAST LUMPECTOMY WITH RADIOACTIVE SEED AND SENTINEL LYMPH NODE BIOPSY Left 07/29/2023   Procedure: LEFT BREAST SEED LUMPECTOMY WITH LEFT SENTINEL LYMPH NODE MAPPING;  Surgeon:  Harriette Bouillon, MD;  Location: [facility] OR;  Service: General;  Laterality: Left;  PEC BLOCK   CATARACT EXTRACTION Bilateral    2021, 2022   COLONOSCOPY  2023   PLEURAL SCARIFICATION     ruptured disk     torn tendon     right arm   TUBAL LIGATION     Patient Active Problem List   Diagnosis Date Noted   Genetic testing 07/11/2023   Malignant neoplasm of upper-outer quadrant of left breast in female, estrogen receptor negative (HCC) 06/30/2023   Cervical spondylosis with radiculopathy 09/23/2022   Elevated low-density lipoprotein level 06/14/2022   Coronary arteriosclerosis 06/14/2022   Adverse reaction to antihyperlipidemic drug, initial encounter 06/14/2022   Diarrhea 01/27/2019   Change in bowel habits 01/05/2019   Left inguinal hernia 06/27/2014   Bloating 05/17/2013   Constipation 05/17/2013   Pneumothorax 10/17/2008   HIATAL HERNIA 10/17/2008   DIVERTICULOSIS, COLON 10/17/2008   ENDOMETRIOSIS 10/17/2008   Right lower quadrant abdominal pain 10/17/2008   DEPRESSION, HX OF 10/17/2008   History of colonic polyps 10/17/2008   PNEUMOTHORAX 10/17/2008    PCP: Dr Renne Crigler   REFERRING PROVIDER: Rachel Moulds, MD  REFERRING DIAG: Left Breast Cancer  THERAPY DIAG:  Lymphedema, not elsewhere classified  Breast pain  Malignant neoplasm of upper-outer quadrant of left breast in female, estrogen receptor negative (HCC)  ONSET DATE: 1 year ago  Rationale for Evaluation and Treatment: Rehabilitation  SUBJECTIVE:                                                                                                                                                                                           SUBJECTIVE STATEMENT:   The finger is getting an irritated spot from the glove. I was not as sore after last time.   PERTINENT HISTORY:  Patient was diagnosed on 08/15/2023 with left grade 3 invasive ductal carcinoma breast cancer. She underwent a left lumpectomy and sentinel node biopsy  (13 negative nodes) on 07/29/2023. It is triple negative with a Ki67 of 95%. She has a history of a cervical fusion in 08/2022. She ended radiation in October 2024. She had a breast seroma aspirated of 10 cc on 01/22/2024 . She has had prior PT treatment for cording and left UE/breast swelling  PAIN:  Are you having pain?  NPRS scale: 2/10 Pain location: left lateral breast/chest Pain orientation: Left and Lateral  PAIN TYPE: burning and rushing, pressure  Aggravating factors: laying on left side,arm rubbing side, touching it,raising arm Relieving factors: propping her arm, advil  PRECAUTIONS:  L UE lymphedema risk, cervical fusion C6, C7   RED FLAGS: Compression fracture: Yes: T12    WEIGHT BEARING RESTRICTIONS: No  FALLS:  Has patient fallen in last 6 months? No  LIVING ENVIRONMENT: Lives with: lives with their spouse Lives in: House/apartment Stairs: Yes; External: 5 steps; can reach both Has following equipment at home: None  OCCUPATION: locksmith  LEISURE: play cards,   HAND DOMINANCE: right   PRIOR LEVEL OF FUNCTION: Independent  PATIENT GOALS: Get rid of pain and swelling   OBJECTIVE: Note: Objective measures were completed at Evaluation unless otherwise noted.  COGNITION: Overall cognitive status: Within functional limits for tasks assessed   PALPATION: Tender lateral breast, inferior breast,axilla  OBSERVATIONS / OTHER ASSESSMENTS: generalized swelling,mild redness left breast. Nipple red and irritated looking, significant indentation on left from compression bra  SENSATION: Light touch: Deficits      POSTURE: forward head, rounded shoulders  UPPER EXTREMITY AROM/PROM:  A/PROM RIGHT   eval   Shoulder extension   Shoulder flexion 155,   Shoulder abduction 165,  Shoulder internal rotation   Shoulder external rotation     (Blank rows = not tested)  A/PROM LEFT   eval  Shoulder extension   Shoulder flexion 153  Shoulder abduction 162   Shoulder internal rotation   Shoulder external rotation     (  Blank rows = not tested)  CERVICAL AROM: All within functional limits:     UPPER EXTREMITY STRENGTH:   LYMPHEDEMA ASSESSMENTS:   SURGERY TYPE/DATE: 07/29/23 L lumpectomy and SLNB   NUMBER OF LYMPH NODES REMOVED: 0+/13  CHEMOTHERAPY: no pt declined  RADIATION:YES, completed Oct 2024  HORMONE TREATMENT: None  INFECTIONS: none   LYMPHEDEMA ASSESSMENTS:   LANDMARK RIGHT  eval  At axilla  33.4  15 cm proximal to olecranon process 31.3  10 cm proximal to olecranon process 29.5  Olecranon process 26.4  15 cm proximal to ulnar styloid process 24.2  10 cm proximal to ulnar styloid process 21.0  Just proximal to ulnar styloid process 16.8  Across hand at thumb web space 19.8  At base of 2nd digit 6.4  (Blank rows = not tested)  LANDMARK LEFT  eval LEFT 02/25/2024 LEFT 03/08/2024  At axilla  33.2 33.9 32.5  15 cm proximal to olecranon process 32.2 31.3   10 cm proximal to olecranon process 30.5 30.6 30.5  Olecranon process 27.9 27.6 27.4  15 cm proximal to ulnar styloid process 24.5 24.1 24.3  10 cm proximal to ulnar styloid process 21.5 21 21.0  Just proximal to ulnar styloid process 18.65 17.5 17.7  Across hand at thumb web space 21.0 20.5   At base of 2nd digit 6.35 6.3 6.3  (Blank rows = not tested)   FUNCTIONAL TESTS:    GAIT:WNL   L-DEX LYMPHEDEMA SCREENING: The patient was assessed using the L-Dex machine today to produce a lymphedema index baseline score. The patient will be reassessed on a regular basis (typically every 3 months) to obtain new L-Dex scores. If the score is > 6.5 points away from his/her baseline score indicating onset of subclinical lymphedema, it will be recommended to wear a compression garment for 4 weeks, 12 hours per day and then be reassessed. If the score continues to be > 6.5 points from baseline at reassessment, we will initiate lymphedema treatment. Assessing in this  manner has a 95% rate of preventing clinically significant lymphedema.  QUICK DASH SURVEY: 48%    BREAST COMPLAINTS QUESTIONNAIRE Pain:8 Heaviness:10 Swollen feeling:10 Tense Skin:8 Redness:3 Bra Print:8 Size of Pores:5 Hard feeling: 8 Total:    60 /80 A Score over 9 indicates lymphedema issues in the breast    The patient was assessed using the L-Dex machine today to produce a lymphedema index baseline score. The patient will be reassessed on a regular basis (typically every 3 months) to obtain new L-Dex scores. If the score is > 6.5 points away from his/her baseline score indicating onset of subclinical lymphedema, it will be recommended to wear a compression garment for 4 weeks, 12 hours per day and then be reassessed. If the score continues to be > 6.5 points from baseline at reassessment, we will initiate lymphedema treatment. Assessing in this manner has a 95% rate of preventing clinically significant lymphedema.  TREATMENT DATE:  Pt permission and consent throughout each step of examination and treatment with modification and draping if requested when working on sensitive areas 03/11/2024 MFR to right axilla, upper arm, cording at lateral breast running vertically, and to lumpectomy scar In supine: Short neck, 5 diaphragmatic breaths, R axillary nodes and establishment of interaxillary pathway, L inguinal nodes and establishment of axilloinguinal pathway, then L breast moving fluid towards pathways, then left UE outer arm, medial to lateral, outer arm again spending extra time in any areas of fibrosis then retracing pathways and  ending with LN's.  Discussed another sleeve/glove with pt; measured for Medi espirit, exostrong but pt was out of range for the glove Assisted pt with donning   03/08/2024 Measured left arm MFR to right axillary, upper arm, cording, and briefly to  left breast cording running vertically from clavicle, and horizontally across breast In supine: Short neck, 5 diaphragmatic breaths, R axillary nodes and establishment of interaxillary pathway, L inguinal nodes and establishment of axilloinguinal pathway, then L breast moving fluid towards pathways, then left UE outer arm, medial to lateral, outer arm again spending extra time in any areas of fibrosis then retracing pathways and  ending with LN's.  Discussed another sleeve/glove with pt; consider Medi Espirit due to fabric made more for sensitive skin per [place].  Gave pt some more thin comprilan foam to place under silicone border and discussed wearing sleeve for just a few hours at a time and taking glove off more frequently as needed  to prevent finger swelling/pain. Also discussed stretching end of fingers with a large marker or similar. Discussed bandaging as an option as well, and thought about doing it today but was hopeful she could meet with Rodman Pickle today. Rodman Pickle had other appts as did pt so I spoke with Rodman Pickle only  03/04/24: MFR to cording at L lateral breast, axilla and chest IASTM using boomerang tool to L pec with cocoa butter then to cording at left breast and axilla with improvements noted in facial adhesions In supine: Short neck, 5 diaphragmatic breaths, R axillary nodes and establishment of interaxillary pathway, L inguinal nodes and establishment of axilloinguinal pathway, then L breast moving fluid towards pathways and LUE working proximal and moving distally then retracing all steps.  Repeated SOZO which was still in the red   The patient was assessed using the L-Dex machine today to produce a lymphedema index baseline score. The patient will be reassessed on a regular basis (typically every 3 months) to obtain new L-Dex scores. If the score is > 6.5 points away from his/her baseline score indicating onset of subclinical lymphedema, it will be recommended to wear a compression  garment for 4 weeks, 12 hours per day and then be reassessed. If the score continues to be > 6.5 points from baseline at reassessment, we will initiate lymphedema treatment. Assessing in this manner has a 95% rate of preventing clinically significant lymphedema.   02/26/2023 02/27/2024 MFR to right axillary, upper arm, and forearm cording, and briefly to left breast cording running vertically from clavicle, and horizontally across breast In supine: Short neck, 5 diaphragmatic breaths, R axillary nodes and establishment of interaxillary pathway, L inguinal nodes and establishment of axilloinguinal pathway, then L breast moving fluid towards pathways spending extra time in any areas of fibrosis then retracing all steps then ending with LN's.  Small piece of comprilan soft foam made for medial arm sleeve under silicone. Peach dot foam in stockinette made and placed at dorsum  of hand, but will be very difficult for pt to get in herself. Advised she can try with flat side down or dot side down but to only wear if comfortable. Advised to remove sleeve in afternoon if it is uncomfortable, and replace in an hour or so after arm rests. Repeat SOZO next   02/25/2024 Measured left UE with some reduction noted Pt getting discomfort at top of sleeve and swelling top of hand with glove. Possibly sleeve laying on tight cording noted there;made small piece of 1/4 in foam to put under top band of sleeve. Pt will put glove on and off to try and avoid dorsum of hand swelling. Maybe foam pad in glove would help dorsum of hand. Lowered strap on bra to try and get axillary part from pinching at her armpit. In supine: Short neck, 5 diaphragmatic breaths, R axillary nodes and establishment of interaxillary pathway, L inguinal nodes and establishment of axilloinguinal pathway, then L breast moving fluid towards pathways spending extra time in any areas of fibrosis then retracing all steps then ending with LN's. Did some in right  SL to left lateral breast and AI pathway and ended with LN's MFR to left axillary cording. Showed pt shoulder extension with wrist ext to stretch cords 02/16/24: Checked fit of sleeve and glove and they seem okay - not too tight  In supine: Short neck, 5 diaphragmatic breaths, R axillary nodes and establishment of interaxillary pathway, L inguinal nodes and establishment of axilloinguinal pathway, then L breast moving fluid towards pathways spending extra time in any areas of fibrosis then retracing all steps then LUE working proximal to distal and retracing all steps.  Gentle soft tissue mobilization to L lumpectomy scar  02/11/24: In supine: Short neck, 5 diaphragmatic breaths, R axillary nodes and establishment of interaxillary pathway, L inguinal nodes and establishment of axilloinguinal pathway, then L breast moving fluid towards pathways spending extra time in any areas of fibrosis then retracing all steps then LUE working proximal to distal and retracing all steps.  Gentle soft tissue mobilization to L lumpectomy scar At end of session pt was able to palpate a vertical cord near axillary/breast fold where pt has increased pain so began gentle MFR to this area  02/04/2024 Discussed POC, LOS, treatment interventions. Had Deshauna Cayson also check redness at breast;agrees no infection. Gave script for pt to look into getting a different compression bra at [facility] [place] that might fit better. Pt to bring arm and hand garments next visit.    PATIENT EDUCATION:  Education details:POC, LOS, treatment interventions. Had Taiten Brawn also check redness at breast;agrees no infection. Gave script for pt to look into getting a different compression bra at [facility] [place]. Dorothyann Gibbs also spoke to her about a Teacher, early years/pre. Person educated: Patient Education method: Explanation Education comprehension: verbalized understanding  HOME EXERCISE PROGRAM:   ASSESSMENT:  CLINICAL IMPRESSION: Pt is still having irritation from  the glove. Measured her for off the shelf flat knit glove but she did not fit in the size range. Pt was not interested in custom She may fit in to a 30-40 mmHg circular knit. Continued with gentle MFR to cording following by MLD to help decrease pain and swelling.   OBJECTIVE IMPAIRMENTS: decreased activity tolerance, decreased knowledge of condition, increased edema, impaired UE functional use, postural dysfunction, and pain.   ACTIVITY LIMITATIONS: sleeping, bed mobility, and reach over head  PARTICIPATION LIMITATIONS:  anything requiring use of her arm to reach in certain directions putting pressure  on breast  PERSONAL FACTORS: 1-2 comorbidities: Left triple negative breast cancer s/p radiation  are also affecting patient's functional outcome.   REHAB POTENTIAL: Good  CLINICAL DECISION MAKING: Evolving/moderate complexity  EVALUATION COMPLEXITY: Moderate  GOALS: Goals reviewed with patient? Yes  SHORT TERM GOALS Target date: 02/25/2024   1.  Pt will have decreased Left breast pain by 25%% Baseline:  Goal status: INITIAL  2.  Pt will have decreased breast complaints questionnaire score to 40/80 Baseline:  Goal status: INITIAL  3.  Pt will be compliant with compression bra/vest to decrease swelling Baseline:  Goal status: INITIAL  4.  Pt will have new compression garments for arm/hand prn Baseline:  Goal status: INITIAL  LONG TERM GOALS: Target date: 03/17/2024  Pt will have decreased breast pain by 50% for improved activity tolerance Baseline:  Goal status: INITIAL  2.  Pt will be independent with MLD to left breast and UE prn to decrease swelling Baseline:  Goal status: INITIAL  3.  Breast complaints questionnaire will be decreased to no greater than 25 to demonstrate improved swelling Baseline:  Goal status: INITIAL  4.  Pt will be able to sleep on her left side without left breast pain Baseline:  Goal status: INITIAL  5.  Pt will be tolerant of UE  compression garments and will note decreased edema throughout Baseline:  Goal status: INITIAL    PLAN:  PT FREQUENCY: 2x/week  PT DURATION: 6 weeks  PLANNED INTERVENTIONS: 97164- PT Re-evaluation, 97110-Therapeutic exercises, 97530- Therapeutic activity, 97112- Neuromuscular re-education, 97535- Self Care, 19147- Manual therapy, and 97760- Orthotic Fit/training  PLAN FOR NEXT SESSION: do not do IASTM, MFR to breast cord and axillary cording, breast MLD, UE MLD, continue to modify garments prn;consider Medi Espirit and adifferent glove if she is not able to get hers to be comfortable for her.  [facility], PT 03/11/2024, 4:01 PM

## 2024-03-15 ENCOUNTER — Ambulatory Visit: Payer: Medicare Other

## 2024-03-17 ENCOUNTER — Ambulatory Visit: Payer: Medicare Other

## 2024-03-17 DIAGNOSIS — N644 Mastodynia: Secondary | ICD-10-CM | POA: Diagnosis not present

## 2024-03-17 DIAGNOSIS — Z171 Estrogen receptor negative status [ER-]: Secondary | ICD-10-CM

## 2024-03-17 DIAGNOSIS — I890 Lymphedema, not elsewhere classified: Secondary | ICD-10-CM | POA: Diagnosis not present

## 2024-03-17 DIAGNOSIS — R600 Localized edema: Secondary | ICD-10-CM | POA: Diagnosis not present

## 2024-03-17 DIAGNOSIS — Z483 Aftercare following surgery for neoplasm: Secondary | ICD-10-CM

## 2024-03-17 DIAGNOSIS — R293 Abnormal posture: Secondary | ICD-10-CM | POA: Diagnosis not present

## 2024-03-17 DIAGNOSIS — C50412 Malignant neoplasm of upper-outer quadrant of left female breast: Secondary | ICD-10-CM | POA: Diagnosis not present

## 2024-03-17 DIAGNOSIS — M25612 Stiffness of left shoulder, not elsewhere classified: Secondary | ICD-10-CM | POA: Diagnosis not present

## 2024-03-17 NOTE — Therapy (Addendum)
 OUTPATIENT PHYSICAL THERAPY  UPPER EXTREMITY ONCOLOGY  Patient Name: [name] [name] MRN: [id number] DOB:10-Sep-1951, 73 y.o., female Today's Date: 03/17/2024  END OF SESSION:  PT End of Session - 03/17/24 1107     Visit Number 9    Number of Visits 12    Date for PT Re-Evaluation 03/17/24    PT Start Time 1107    PT Stop Time 1200    PT Time Calculation (min) 53 min    Activity Tolerance Patient tolerated treatment well    Behavior During Therapy WFL for tasks assessed/performed              Past Medical History:  Diagnosis Date   Adenomatous colon polyp    Allergy    Anxiety    Arthritis    bilateral hands. lower back   Cancer [facility])    skin   Cataract    bilateral   COPD (chronic obstructive pulmonary disease) (HCC)    Depression    Diverticulosis    Endometriosis    GERD (gastroesophageal reflux disease)    Glaucoma    Headache    Migraines   Hiatal hernia    History of left breast cancer 05/2023   History of radiation therapy    Left breast- 09/25/23-10/17/23- Dr. Antony Blackbird   IBS (irritable bowel syndrome)    Inguinal hernia    Pneumothorax 1987   PONV (postoperative nausea and vomiting)    Past Surgical History:  Procedure Laterality Date   ABDOMINAL HYSTERECTOMY  1992   ANTERIOR CERVICAL DECOMP/DISCECTOMY FUSION N/A 09/23/2022   Procedure: Anterior Decompression Fusion,PLATE/SCREWS Cervical five-six; REMOVAL OLD PLATE;  Surgeon: Tressie Stalker, MD;  Location: [facility] OR;  Service: Neurosurgery;  Laterality: N/A;   APPENDECTOMY  1992   BACK SURGERY     cervical   BREAST BIOPSY Left 06/23/2023   [place] LT BREAST BX W LOC DEV 1ST LESION IMG BX SPEC US GUIDE 06/23/2023 [facility]   BREAST BIOPSY Left 07/28/2023   [place] LT RADIOACTIVE SEED LOC 07/28/2023 [facility]   BREAST LUMPECTOMY WITH RADIOACTIVE SEED AND SENTINEL LYMPH NODE BIOPSY Left 07/29/2023   Procedure: LEFT BREAST SEED LUMPECTOMY WITH LEFT SENTINEL LYMPH NODE MAPPING;  Surgeon:  Harriette Bouillon, MD;  Location: [facility] OR;  Service: General;  Laterality: Left;  PEC BLOCK   CATARACT EXTRACTION Bilateral    2021, 2022   COLONOSCOPY  2023   PLEURAL SCARIFICATION     ruptured disk     torn tendon     right arm   TUBAL LIGATION     Patient Active Problem List   Diagnosis Date Noted   Genetic testing 07/11/2023   Malignant neoplasm of upper-outer quadrant of left breast in female, estrogen receptor negative (HCC) 06/30/2023   Cervical spondylosis with radiculopathy 09/23/2022   Elevated low-density lipoprotein level 06/14/2022   Coronary arteriosclerosis 06/14/2022   Adverse reaction to antihyperlipidemic drug, initial encounter 06/14/2022   Diarrhea 01/27/2019   Change in bowel habits 01/05/2019   Left inguinal hernia 06/27/2014   Bloating 05/17/2013   Constipation 05/17/2013   Pneumothorax 10/17/2008   HIATAL HERNIA 10/17/2008   DIVERTICULOSIS, COLON 10/17/2008   ENDOMETRIOSIS 10/17/2008   Right lower quadrant abdominal pain 10/17/2008   DEPRESSION, HX OF 10/17/2008   History of colonic polyps 10/17/2008   PNEUMOTHORAX 10/17/2008    PCP: Dr Renne Crigler   REFERRING PROVIDER: Rachel Moulds, MD  REFERRING DIAG: Left Breast Cancer  THERAPY DIAG:  Lymphedema, not elsewhere classified  Breast pain  Malignant neoplasm of upper-outer quadrant of left breast in female, estrogen receptor negative (HCC)  Aftercare following surgery for neoplasm  Abnormal posture  ONSET DATE: 1 year ago  Rationale for Evaluation and Treatment: Rehabilitation  SUBJECTIVE:                                                                                                                                                                                           SUBJECTIVE STATEMENT:   Right now I am not in any pain unless I hit my breast.  Overall pain is better by 75%. She is keeping up with the stretches. My fingers are still swollen but my hand looks better. I have been able to  wear my sleeve longer too. The swelling in my breast has been bad and it seems to go further around my back  PERTINENT HISTORY:  Patient was diagnosed on 08/15/2023 with left grade 3 invasive ductal carcinoma breast cancer. She underwent a left lumpectomy and sentinel node biopsy (13 negative nodes) on 07/29/2023. It is triple negative with a Ki67 of 95%. She has a history of a cervical fusion in 08/2022. She ended radiation in October 2024. She had a breast seroma aspirated of 10 cc on 01/22/2024 . She has had prior PT treatment for cording and left UE/breast swelling  PAIN:  Are you having pain?  NPRS scale: 2/10 Pain location: left lateral breast/chest Pain orientation: Left and Lateral  PAIN TYPE: burning and rushing, pressure  Aggravating factors: laying on left side,arm rubbing side, touching it,raising arm Relieving factors: propping her arm, advil  PRECAUTIONS:  L UE lymphedema risk, cervical fusion C6, C7   RED FLAGS: Compression fracture: Yes: T12    WEIGHT BEARING RESTRICTIONS: No  FALLS:  Has patient fallen in last 6 months? No  LIVING ENVIRONMENT: Lives with: lives with their spouse Lives in: House/apartment Stairs: Yes; External: 5 steps; can reach both Has following equipment at home: None  OCCUPATION: locksmith  LEISURE: play cards,   HAND DOMINANCE: right   PRIOR LEVEL OF FUNCTION: Independent  PATIENT GOALS: Get rid of pain and swelling   OBJECTIVE: Note: Objective measures were completed at Evaluation unless otherwise noted.  COGNITION: Overall cognitive status: Within functional limits for tasks assessed   PALPATION: Tender lateral breast, inferior breast,axilla  OBSERVATIONS / OTHER ASSESSMENTS: generalized swelling,mild redness left breast. Nipple red and irritated looking, significant indentation on left from compression bra  SENSATION: Light touch: Deficits      POSTURE: forward head, rounded shoulders  UPPER EXTREMITY  AROM/PROM:  A/PROM RIGHT   eval   Shoulder extension   Shoulder flexion 155,  Shoulder abduction 165,  Shoulder internal rotation   Shoulder external rotation     (Blank rows = not tested)  A/PROM LEFT   eval  Shoulder extension   Shoulder flexion 153  Shoulder abduction 162  Shoulder internal rotation   Shoulder external rotation     (Blank rows = not tested)  CERVICAL AROM: All within functional limits:     UPPER EXTREMITY STRENGTH:   LYMPHEDEMA ASSESSMENTS:   SURGERY TYPE/DATE: 07/29/23 L lumpectomy and SLNB   NUMBER OF LYMPH NODES REMOVED: 0+/13  CHEMOTHERAPY: no pt declined  RADIATION:YES, completed Oct 2024  HORMONE TREATMENT: None  INFECTIONS: none   LYMPHEDEMA ASSESSMENTS:   LANDMARK RIGHT  eval  At axilla  33.4  15 cm proximal to olecranon process 31.3  10 cm proximal to olecranon process 29.5  Olecranon process 26.4  15 cm proximal to ulnar styloid process 24.2  10 cm proximal to ulnar styloid process 21.0  Just proximal to ulnar styloid process 16.8  Across hand at thumb web space 19.8  At base of 2nd digit 6.4  (Blank rows = not tested)  LANDMARK LEFT  eval LEFT 02/25/2024 LEFT 03/08/2024 LEFT 3/19/20256.3  At axilla  33.2 33.9 32.5 32.5  15 cm proximal to olecranon process 32.2 31.3  31.3  10 cm proximal to olecranon process 30.5 30.6 30.5 30.3  Olecranon process 27.9 27.6 27.4 27.6  15 cm proximal to ulnar styloid process 24.5 24.1 24.3 24.0  10 cm proximal to ulnar styloid process 21.5 21 21.0 20.9  Just proximal to ulnar styloid process 18.65 17.5 17.7 18.3  Across hand at thumb web space 21.0 20.5  21.0  At base of 2nd digit 6.35 6.3 6.3 6.3  (Blank rows = not tested)   FUNCTIONAL TESTS:    GAIT:WNL   L-DEX LYMPHEDEMA SCREENING: The patient was assessed using the L-Dex machine today to produce a lymphedema index baseline score. The patient will be reassessed on a regular basis (typically every 3 months) to obtain new  L-Dex scores. If the score is > 6.5 points away from his/her baseline score indicating onset of subclinical lymphedema, it will be recommended to wear a compression garment for 4 weeks, 12 hours per day and then be reassessed. If the score continues to be > 6.5 points from baseline at reassessment, we will initiate lymphedema treatment. Assessing in this manner has a 95% rate of preventing clinically significant lymphedema.  QUICK DASH SURVEY: 48%    BREAST COMPLAINTS QUESTIONNAIRE Pain:8 Heaviness:10 Swollen feeling:10 Tense Skin:8 Redness:3 Bra Print:8 Size of Pores:5 Hard feeling: 8 Total:    60 /80 A Score over 9 indicates lymphedema issues in the breast  L-DEX FLOWSHEETS - 03/17/24 1100       L-DEX LYMPHEDEMA SCREENING   Measurement Type Unilateral    L-DEX MEASUREMENT EXTREMITY Upper Extremity    POSITION  Standing    DOMINANT SIDE Right    At Risk Side Left    BASELINE SCORE (UNILATERAL) 4.5    L-DEX SCORE (UNILATERAL) 16    VALUE CHANGE (UNILAT) 11.5              The patient was assessed using the L-Dex machine today to produce a lymphedema index baseline score. The patient will be reassessed on a regular basis (typically every 3 months) to obtain new L-Dex scores. If the score is > 6.5 points away from his/her baseline score indicating onset of subclinical lymphedema, it will be recommended to wear a compression garment  for 4 weeks, 12 hours per day and then be reassessed. If the score continues to be > 6.5 points from baseline at reassessment, we will initiate lymphedema treatment. Assessing in this manner has a 95% rate of preventing clinically significant lymphedema.                                                                                                              TREATMENT DATE:  Pt permission and consent throughout each step of examination and treatment with modification and draping if requested when working on sensitive areas  03/17/2024 Discussed  treatment improvements and remaining deficits Measured left UE and performed SOZO screen; still in the red MFR to Left axilla and medial upper arm to release cord; In supine: Short neck, 5 diaphragmatic breaths, R axillary nodes and establishment of interaxillary pathway, L inguinal nodes and establishment of axilloinguinal pathway, then L breast moving fluid towards pathways, then left UE outer arm, medial to lateral, outer arm again spending extra time in any areas of fibrosis then retracing pathways and  ending with LN's.  Discussed Flexi touch and pt would like me to send demographics 03/11/2024 MFR to right axilla, upper arm, cording at lateral breast running vertically, and to lumpectomy scar In supine: Short neck, 5 diaphragmatic breaths, R axillary nodes and establishment of interaxillary pathway, L inguinal nodes and establishment of axilloinguinal pathway, then L breast moving fluid towards pathways, then left UE outer arm, medial to lateral, outer arm again spending extra time in any areas of fibrosis then retracing pathways and  ending with LN's.  Discussed another sleeve/glove with pt; measured for Medi espirit, exostrong but pt was out of range for the glove Assisted pt with donning   03/08/2024 Measured left arm MFR to right axillary, upper arm, cording, and briefly to left breast cording running vertically from clavicle, and horizontally across breast In supine: Short neck, 5 diaphragmatic breaths, R axillary nodes and establishment of interaxillary pathway, L inguinal nodes and establishment of axilloinguinal pathway, then L breast moving fluid towards pathways, then left UE outer arm, medial to lateral, outer arm again spending extra time in any areas of fibrosis then retracing pathways and  ending with LN's.  Discussed another sleeve/glove with pt; consider Medi Espirit due to fabric made more for sensitive skin per [place].  Gave pt some more thin comprilan foam to place under  silicone border and discussed wearing sleeve for just a few hours at a time and taking glove off more frequently as needed  to prevent finger swelling/pain. Also discussed stretching end of fingers with a large marker or similar. Discussed bandaging as an option as well, and thought about doing it today but was hopeful she could meet with Rodman Pickle today. Rodman Pickle had other appts as did pt so I spoke with Rodman Pickle only  03/04/24: MFR to cording at L lateral breast, axilla and chest IASTM using boomerang tool to L pec with cocoa butter then to cording at left breast and axilla with improvements noted in facial adhesions In  supine: Short neck, 5 diaphragmatic breaths, R axillary nodes and establishment of interaxillary pathway, L inguinal nodes and establishment of axilloinguinal pathway, then L breast moving fluid towards pathways and LUE working proximal and moving distally then retracing all steps.  Repeated SOZO which was still in the red  L-DEX FLOWSHEETS - 03/17/24 1100       L-DEX LYMPHEDEMA SCREENING   Measurement Type Unilateral    L-DEX MEASUREMENT EXTREMITY Upper Extremity    POSITION  Standing    DOMINANT SIDE Right    At Risk Side Left    BASELINE SCORE (UNILATERAL) 4.5    L-DEX SCORE (UNILATERAL) 16    VALUE CHANGE (UNILAT) 11.5             The patient was assessed using the L-Dex machine today to produce a lymphedema index baseline score. The patient will be reassessed on a regular basis (typically every 3 months) to obtain new L-Dex scores. If the score is > 6.5 points away from his/her baseline score indicating onset of subclinical lymphedema, it will be recommended to wear a compression garment for 4 weeks, 12 hours per day and then be reassessed. If the score continues to be > 6.5 points from baseline at reassessment, we will initiate lymphedema treatment. Assessing in this manner has a 95% rate of preventing clinically significant lymphedema.   02/26/2023 02/27/2024 MFR to  right axillary, upper arm, and forearm cording, and briefly to left breast cording running vertically from clavicle, and horizontally across breast In supine: Short neck, 5 diaphragmatic breaths, R axillary nodes and establishment of interaxillary pathway, L inguinal nodes and establishment of axilloinguinal pathway, then L breast moving fluid towards pathways spending extra time in any areas of fibrosis then retracing all steps then ending with LN's.  Small piece of comprilan soft foam made for medial arm sleeve under silicone. Peach dot foam in stockinette made and placed at dorsum of hand, but will be very difficult for pt to get in herself. Advised she can try with flat side down or dot side down but to only wear if comfortable. Advised to remove sleeve in afternoon if it is uncomfortable, and replace in an hour or so after arm rests. Repeat SOZO next   02/25/2024 Measured left UE with some reduction noted Pt getting discomfort at top of sleeve and swelling top of hand with glove. Possibly sleeve laying on tight cording noted there;made small piece of 1/4 in foam to put under top band of sleeve. Pt will put glove on and off to try and avoid dorsum of hand swelling. Maybe foam pad in glove would help dorsum of hand. Lowered strap on bra to try and get axillary part from pinching at her armpit. In supine: Short neck, 5 diaphragmatic breaths, R axillary nodes and establishment of interaxillary pathway, L inguinal nodes and establishment of axilloinguinal pathway, then L breast moving fluid towards pathways spending extra time in any areas of fibrosis then retracing all steps then ending with LN's. Did some in right SL to left lateral breast and AI pathway and ended with LN's MFR to left axillary cording. Showed pt shoulder extension with wrist ext to stretch cords 02/16/24: Checked fit of sleeve and glove and they seem okay - not too tight  In supine: Short neck, 5 diaphragmatic breaths, R axillary  nodes and establishment of interaxillary pathway, L inguinal nodes and establishment of axilloinguinal pathway, then L breast moving fluid towards pathways spending extra time in any areas of fibrosis  then retracing all steps then LUE working proximal to distal and retracing all steps.  Gentle soft tissue mobilization to L lumpectomy scar  02/11/24: In supine: Short neck, 5 diaphragmatic breaths, R axillary nodes and establishment of interaxillary pathway, L inguinal nodes and establishment of axilloinguinal pathway, then L breast moving fluid towards pathways spending extra time in any areas of fibrosis then retracing all steps then LUE working proximal to distal and retracing all steps.  Gentle soft tissue mobilization to L lumpectomy scar At end of session pt was able to palpate a vertical cord near axillary/breast fold where pt has increased pain so began gentle MFR to this area  02/04/2024 Discussed POC, LOS, treatment interventions. Had Blaire also check redness at breast;agrees no infection. Gave script for pt to look into getting a different compression bra at [facility] that might fit better. Pt to bring arm and hand garments next visit.    PATIENT EDUCATION:  Education details:POC, LOS, treatment interventions. Had Blaire also check redness at breast;agrees no infection. Gave script for pt to look into getting a different compression bra at [facility]. Dorothyann Gibbs also spoke to her about a Teacher, early years/pre. Person educated: Patient Education method: Explanation Education comprehension: verbalized understanding  HOME EXERCISE PROGRAM:   ASSESSMENT:  CLINICAL IMPRESSION: Pt made slow progress in the beginning but is now reporting overall pain improved by 75% from the beginning. She still has cording in the axilla and the left breast, and swelling in the breast remains despite the pt using her compression bra daily and a sports bra at night.  Her SOZO screen is still elevated in the red but  has improved from previous by 2 points. She is tolerating the compression sleeve longer, and feels that even though her fingers are swollen, her hand doesn't swell as much as it does with the glove on. She is aware that she may require custom garments. She will benefit from continued skilled PT to address remaining deficits and return pt to PLOF  OBJECTIVE IMPAIRMENTS: decreased activity tolerance, decreased knowledge of condition, increased edema, impaired UE functional use, postural dysfunction, and pain.   ACTIVITY LIMITATIONS: sleeping, bed mobility, and reach over head  PARTICIPATION LIMITATIONS:  anything requiring use of her arm to reach in certain directions putting pressure on breast  PERSONAL FACTORS: 1-2 comorbidities: Left triple negative breast cancer s/p radiation  are also affecting patient's functional outcome.   REHAB POTENTIAL: Good  CLINICAL DECISION MAKING: Evolving/moderate complexity  EVALUATION COMPLEXITY: Moderate  GOALS: Goals reviewed with patient? Yes  SHORT TERM GOALS Target date: 02/25/2024   1.  Pt will have decreased Left breast pain by 25%% Baseline:  Goal status: MET 03/17/2024 2.  Pt will have decreased breast complaints questionnaire score to 40/80 Baseline:  Goal status: In progress 3.  Pt will be compliant with compression bra/vest to decrease swelling Baseline:  Goal status:MET 03/17/2024 4.  Pt will have new compression garments for arm/hand prn Baseline:  Goal status: In progress LONG TERM GOALS: Target date: 03/17/2024  Pt will have decreased breast pain by 50% for improved activity tolerance Baseline:  Goal status: MET 03/17/2024 2.  Pt will be independent with MLD to left breast and UE prn to decrease swelling Baseline:  Goal status: In Progress  3.  Breast complaints questionnaire will be decreased to no greater than 25 to demonstrate improved swelling Baseline:  Goal status: In Progress 4.  Pt will be able to sleep on her left side  without  left breast pain Baseline:  Goal status: In Progress  5.  Pt will be tolerant of UE compression garments and will note decreased edema throughout Baseline:  Goal status: partially met; sleeve is more tolerable but glove is not   PLAN:  PT FREQUENCY: 2x/week  PT DURATION: 6 weeks  PLANNED INTERVENTIONS: 97164- PT Re-evaluation, 97110-Therapeutic exercises, 97530- Therapeutic activity, 97112- Neuromuscular re-education, 97535- Self Care, 16109- Manual therapy, and 97760- Orthotic Fit/training  PLAN FOR NEXT SESSION: do not do IASTM, MFR to breast cord and axillary cording, breast MLD, UE MLD, continue to modify garments prn;consider Medi Espirit and adifferent glove if she is not able to get hers to be comfortable for her.(Sent demographics and note to [employer] touch; (03/17/2024)  Waynette Buttery, PT 03/17/2024, 1:07 PM

## 2024-03-22 ENCOUNTER — Telehealth: Payer: Self-pay | Admitting: Adult Health

## 2024-03-22 NOTE — Telephone Encounter (Signed)
 Rescheduled appointment per provider meeting. Called and left VM for the patient with the changes made to her upcoming appointment.

## 2024-03-24 ENCOUNTER — Ambulatory Visit

## 2024-03-24 DIAGNOSIS — I890 Lymphedema, not elsewhere classified: Secondary | ICD-10-CM | POA: Diagnosis not present

## 2024-03-24 DIAGNOSIS — Z171 Estrogen receptor negative status [ER-]: Secondary | ICD-10-CM

## 2024-03-24 DIAGNOSIS — M25612 Stiffness of left shoulder, not elsewhere classified: Secondary | ICD-10-CM | POA: Diagnosis not present

## 2024-03-24 DIAGNOSIS — R293 Abnormal posture: Secondary | ICD-10-CM

## 2024-03-24 DIAGNOSIS — C50412 Malignant neoplasm of upper-outer quadrant of left female breast: Secondary | ICD-10-CM | POA: Diagnosis not present

## 2024-03-24 DIAGNOSIS — Z483 Aftercare following surgery for neoplasm: Secondary | ICD-10-CM

## 2024-03-24 DIAGNOSIS — N644 Mastodynia: Secondary | ICD-10-CM | POA: Diagnosis not present

## 2024-03-24 DIAGNOSIS — R600 Localized edema: Secondary | ICD-10-CM

## 2024-03-24 NOTE — Therapy (Signed)
 OUTPATIENT PHYSICAL THERAPY  UPPER EXTREMITY ONCOLOGY  Patient Name: [name] [name] MRN: [id number] DOB:1951-01-17, 73 y.o., female Today's Date: 03/24/2024  END OF SESSION:  PT End of Session - 03/24/24 1001     Visit Number 10    Number of Visits 18    Date for PT Re-Evaluation 04/14/24    PT Start Time 1003    PT Stop Time 1054    PT Time Calculation (min) 51 min    Activity Tolerance Patient tolerated treatment well    Behavior During Therapy WFL for tasks assessed/performed              Past Medical History:  Diagnosis Date   Adenomatous colon polyp    Allergy    Anxiety    Arthritis    bilateral hands. lower back   Cancer [facility])    skin   Cataract    bilateral   COPD (chronic obstructive pulmonary disease) (HCC)    Depression    Diverticulosis    Endometriosis    GERD (gastroesophageal reflux disease)    Glaucoma    Headache    Migraines   Hiatal hernia    History of left breast cancer 05/2023   History of radiation therapy    Left breast- 09/25/23-10/17/23- Dr. Antony Blackbird   IBS (irritable bowel syndrome)    Inguinal hernia    Pneumothorax 1987   PONV (postoperative nausea and vomiting)    Past Surgical History:  Procedure Laterality Date   ABDOMINAL HYSTERECTOMY  1992   ANTERIOR CERVICAL DECOMP/DISCECTOMY FUSION N/A 09/23/2022   Procedure: Anterior Decompression Fusion,PLATE/SCREWS Cervical five-six; REMOVAL OLD PLATE;  Surgeon: Tressie Stalker, MD;  Location: [facility] OR;  Service: Neurosurgery;  Laterality: N/A;   APPENDECTOMY  1992   BACK SURGERY     cervical   BREAST BIOPSY Left 06/23/2023   [place] LT BREAST BX W LOC DEV 1ST LESION IMG BX SPEC US GUIDE 06/23/2023 [facility]   BREAST BIOPSY Left 07/28/2023   [place] LT RADIOACTIVE SEED LOC 07/28/2023 [facility]   BREAST LUMPECTOMY WITH RADIOACTIVE SEED AND SENTINEL LYMPH NODE BIOPSY Left 07/29/2023   Procedure: LEFT BREAST SEED LUMPECTOMY WITH LEFT SENTINEL LYMPH NODE MAPPING;  Surgeon:  Harriette Bouillon, MD;  Location: [facility] OR;  Service: General;  Laterality: Left;  PEC BLOCK   CATARACT EXTRACTION Bilateral    2021, 2022   COLONOSCOPY  2023   PLEURAL SCARIFICATION     ruptured disk     torn tendon     right arm   TUBAL LIGATION     Patient Active Problem List   Diagnosis Date Noted   Genetic testing 07/11/2023   Malignant neoplasm of upper-outer quadrant of left breast in female, estrogen receptor negative (HCC) 06/30/2023   Cervical spondylosis with radiculopathy 09/23/2022   Elevated low-density lipoprotein level 06/14/2022   Coronary arteriosclerosis 06/14/2022   Adverse reaction to antihyperlipidemic drug, initial encounter 06/14/2022   Diarrhea 01/27/2019   Change in bowel habits 01/05/2019   Left inguinal hernia 06/27/2014   Bloating 05/17/2013   Constipation 05/17/2013   Pneumothorax 10/17/2008   HIATAL HERNIA 10/17/2008   DIVERTICULOSIS, COLON 10/17/2008   ENDOMETRIOSIS 10/17/2008   Right lower quadrant abdominal pain 10/17/2008   DEPRESSION, HX OF 10/17/2008   History of colonic polyps 10/17/2008   PNEUMOTHORAX 10/17/2008    PCP: Dr Renne Crigler   REFERRING PROVIDER: Rachel Moulds, MD  REFERRING DIAG: Left Breast Cancer  THERAPY DIAG:  Lymphedema, not elsewhere classified  Breast pain  Malignant neoplasm of upper-outer quadrant of left breast in female, estrogen receptor negative (HCC)  Aftercare following surgery for neoplasm  Abnormal posture  Stiffness of left shoulder, not elsewhere classified  Localized edema  ONSET DATE: 1 year ago  Rationale for Evaluation and Treatment: Rehabilitation  SUBJECTIVE:                                                                                                                                                                                           SUBJECTIVE STATEMENT:    I am wearing the compression bra 24/7. I have 2 more areas on my breast that are tender. When I lay on my right side I feel  like the fluid is going up to my throat. The pain has come back again.  PERTINENT HISTORY:  Patient was diagnosed on 08/15/2023 with left grade 3 invasive ductal carcinoma breast cancer. She underwent a left lumpectomy and sentinel node biopsy (13 negative nodes) on 07/29/2023. It is triple negative with a Ki67 of 95%. She has a history of a cervical fusion in 08/2022. She ended radiation in October 2024. She had a breast seroma aspirated of 10 cc on 01/22/2024 . She has had prior PT treatment for cording and left UE/breast swelling  PAIN:  Are you having pain?  NPRS scale: 2/10 Pain location: left lateral breast/chest Pain orientation: Left and Lateral  PAIN TYPE: burning and rushing, pressure  Aggravating factors: laying on left side,arm rubbing side, touching it,raising arm Relieving factors: propping her arm, advil  PRECAUTIONS:  L UE lymphedema risk, cervical fusion C6, C7   RED FLAGS: Compression fracture: Yes: T12    WEIGHT BEARING RESTRICTIONS: No  FALLS:  Has patient fallen in last 6 months? No  LIVING ENVIRONMENT: Lives with: lives with their spouse Lives in: House/apartment Stairs: Yes; External: 5 steps; can reach both Has following equipment at home: None  OCCUPATION: locksmith  LEISURE: play cards,   HAND DOMINANCE: right   PRIOR LEVEL OF FUNCTION: Independent  PATIENT GOALS: Get rid of pain and swelling   OBJECTIVE: Note: Objective measures were completed at Evaluation unless otherwise noted.  COGNITION: Overall cognitive status: Within functional limits for tasks assessed   PALPATION: Tender lateral breast, inferior breast,axilla  OBSERVATIONS / OTHER ASSESSMENTS: generalized swelling,mild redness left breast. Nipple red and irritated looking, significant indentation on left from compression bra  SENSATION: Light touch: Deficits      POSTURE: forward head, rounded shoulders  UPPER EXTREMITY AROM/PROM:  A/PROM RIGHT   eval   Shoulder  extension   Shoulder flexion 155,   Shoulder abduction 165,  Shoulder internal rotation  Shoulder external rotation     (Blank rows = not tested)  A/PROM LEFT   eval  Shoulder extension   Shoulder flexion 153  Shoulder abduction 162  Shoulder internal rotation   Shoulder external rotation     (Blank rows = not tested)  CERVICAL AROM: All within functional limits:     UPPER EXTREMITY STRENGTH:   LYMPHEDEMA ASSESSMENTS:   SURGERY TYPE/DATE: 07/29/23 L lumpectomy and SLNB   NUMBER OF LYMPH NODES REMOVED: 0+/13  CHEMOTHERAPY: no pt declined  RADIATION:YES, completed Oct 2024  HORMONE TREATMENT: None  INFECTIONS: none   LYMPHEDEMA ASSESSMENTS:   LANDMARK RIGHT  eval  At axilla  33.4  15 cm proximal to olecranon process 31.3  10 cm proximal to olecranon process 29.5  Olecranon process 26.4  15 cm proximal to ulnar styloid process 24.2  10 cm proximal to ulnar styloid process 21.0  Just proximal to ulnar styloid process 16.8  Across hand at thumb web space 19.8  At base of 2nd digit 6.4  (Blank rows = not tested)  LANDMARK LEFT  eval LEFT 02/25/2024 LEFT 03/08/2024 LEFT 3/19/20256.3  At axilla  33.2 33.9 32.5 32.5  15 cm proximal to olecranon process 32.2 31.3  31.3  10 cm proximal to olecranon process 30.5 30.6 30.5 30.3  Olecranon process 27.9 27.6 27.4 27.6  15 cm proximal to ulnar styloid process 24.5 24.1 24.3 24.0  10 cm proximal to ulnar styloid process 21.5 21 21.0 20.9  Just proximal to ulnar styloid process 18.65 17.5 17.7 18.3  Across hand at thumb web space 21.0 20.5  21.0  At base of 2nd digit 6.35 6.3 6.3 6.3  (Blank rows = not tested)   FUNCTIONAL TESTS:    GAIT:WNL   L-DEX LYMPHEDEMA SCREENING: The patient was assessed using the L-Dex machine today to produce a lymphedema index baseline score. The patient will be reassessed on a regular basis (typically every 3 months) to obtain new L-Dex scores. If the score is > 6.5 points away  from his/her baseline score indicating onset of subclinical lymphedema, it will be recommended to wear a compression garment for 4 weeks, 12 hours per day and then be reassessed. If the score continues to be > 6.5 points from baseline at reassessment, we will initiate lymphedema treatment. Assessing in this manner has a 95% rate of preventing clinically significant lymphedema.  QUICK DASH SURVEY: 48%    BREAST COMPLAINTS QUESTIONNAIRE Pain:8 Heaviness:10 Swollen feeling:10 Tense Skin:8 Redness:3 Bra Print:8 Size of Pores:5 Hard feeling: 8 Total:    60 /80 A Score over 9 indicates lymphedema issues in the breast     The patient was assessed using the L-Dex machine today to produce a lymphedema index baseline score. The patient will be reassessed on a regular basis (typically every 3 months) to obtain new L-Dex scores. If the score is > 6.5 points away from his/her baseline score indicating onset of subclinical lymphedema, it will be recommended to wear a compression garment for 4 weeks, 12 hours per day and then be reassessed. If the score continues to be > 6.5 points from baseline at reassessment, we will initiate lymphedema treatment. Assessing in this manner has a 95% rate of preventing clinically significant lymphedema.  TREATMENT DATE:  Pt permission and consent throughout each step of examination and treatment with modification and draping if requested when working on sensitive areas 03/24/2026 MFR to Left axilla and medial upper arm to release cord; and to left breast briefly PROM left shoulder flex, scaption, abd In supine: Short neck, 5 diaphragmatic breaths, R axillary nodes and establishment of interaxillary pathway, L inguinal nodes and establishment of axilloinguinal pathway, then L breast moving fluid towards pathways, then left UE outer arm, medial to lateral, outer arm  again spending extra time in any areas of fibrosis then retracing pathways and  ending with LN's. Showed single and double arm pectoral stretch in doorway to stretch left pectorals  03/17/2024 Discussed treatment improvements and remaining deficits Measured left UE and performed SOZO screen; still in the red MFR to Left axilla and medial upper arm to release cord; In supine: Short neck, 5 diaphragmatic breaths, R axillary nodes and establishment of interaxillary pathway, L inguinal nodes and establishment of axilloinguinal pathway, then L breast moving fluid towards pathways, then left UE outer arm, medial to lateral, outer arm again spending extra time in any areas of fibrosis then retracing pathways and  ending with LN's.  Discussed [facility] and pt would like me to send demographics 03/11/2024 MFR to right axilla, upper arm, cording at lateral breast running vertically, and to lumpectomy scar In supine: Short neck, 5 diaphragmatic breaths, R axillary nodes and establishment of interaxillary pathway, L inguinal nodes and establishment of axilloinguinal pathway, then L breast moving fluid towards pathways, then left UE outer arm, medial to lateral, outer arm again spending extra time in any areas of fibrosis then retracing pathways and  ending with LN's.  Discussed another sleeve/glove with pt; measured for Medi espirit, exostrong but pt was out of range for the glove Assisted pt with donning   03/08/2024 Measured left arm MFR to right axillary, upper arm, cording, and briefly to left breast cording running vertically from clavicle, and horizontally across breast In supine: Short neck, 5 diaphragmatic breaths, R axillary nodes and establishment of interaxillary pathway, L inguinal nodes and establishment of axilloinguinal pathway, then L breast moving fluid towards pathways, then left UE outer arm, medial to lateral, outer arm again spending extra time in any areas of fibrosis then retracing  pathways and  ending with LN's.  Discussed another sleeve/glove with pt; consider Medi Espirit due to fabric made more for sensitive skin per [place].  Gave pt some more thin comprilan foam to place under silicone border and discussed wearing sleeve for just a few hours at a time and taking glove off more frequently as needed  to prevent finger swelling/pain. Also discussed stretching end of fingers with a large marker or similar. Discussed bandaging as an option as well, and thought about doing it today but was hopeful she could meet with Rodman Pickle today. Rodman Pickle had other appts as did pt so I spoke with Rodman Pickle only  03/04/24: MFR to cording at L lateral breast, axilla and chest IASTM using boomerang tool to L pec with cocoa butter then to cording at left breast and axilla with improvements noted in facial adhesions In supine: Short neck, 5 diaphragmatic breaths, R axillary nodes and establishment of interaxillary pathway, L inguinal nodes and establishment of axilloinguinal pathway, then L breast moving fluid towards pathways and LUE working proximal and moving distally then retracing all steps.  Repeated SOZO which was still in the red    The patient was assessed using the  L-Dex machine today to produce a lymphedema index baseline score. The patient will be reassessed on a regular basis (typically every 3 months) to obtain new L-Dex scores. If the score is > 6.5 points away from his/her baseline score indicating onset of subclinical lymphedema, it will be recommended to wear a compression garment for 4 weeks, 12 hours per day and then be reassessed. If the score continues to be > 6.5 points from baseline at reassessment, we will initiate lymphedema treatment. Assessing in this manner has a 95% rate of preventing clinically significant lymphedema.   02/26/2023 02/27/2024 MFR to right axillary, upper arm, and forearm cording, and briefly to left breast cording running vertically from clavicle, and  horizontally across breast In supine: Short neck, 5 diaphragmatic breaths, R axillary nodes and establishment of interaxillary pathway, L inguinal nodes and establishment of axilloinguinal pathway, then L breast moving fluid towards pathways spending extra time in any areas of fibrosis then retracing all steps then ending with LN's.  Small piece of comprilan soft foam made for medial arm sleeve under silicone. Peach dot foam in stockinette made and placed at dorsum of hand, but will be very difficult for pt to get in herself. Advised she can try with flat side down or dot side down but to only wear if comfortable. Advised to remove sleeve in afternoon if it is uncomfortable, and replace in an hour or so after arm rests. Repeat SOZO next   02/25/2024 Measured left UE with some reduction noted Pt getting discomfort at top of sleeve and swelling top of hand with glove. Possibly sleeve laying on tight cording noted there;made small piece of 1/4 in foam to put under top band of sleeve. Pt will put glove on and off to try and avoid dorsum of hand swelling. Maybe foam pad in glove would help dorsum of hand. Lowered strap on bra to try and get axillary part from pinching at her armpit. In supine: Short neck, 5 diaphragmatic breaths, R axillary nodes and establishment of interaxillary pathway, L inguinal nodes and establishment of axilloinguinal pathway, then L breast moving fluid towards pathways spending extra time in any areas of fibrosis then retracing all steps then ending with LN's. Did some in right SL to left lateral breast and AI pathway and ended with LN's MFR to left axillary cording. Showed pt shoulder extension with wrist ext to stretch cords 02/16/24: Checked fit of sleeve and glove and they seem okay - not too tight  In supine: Short neck, 5 diaphragmatic breaths, R axillary nodes and establishment of interaxillary pathway, L inguinal nodes and establishment of axilloinguinal pathway, then L breast  moving fluid towards pathways spending extra time in any areas of fibrosis then retracing all steps then LUE working proximal to distal and retracing all steps.  Gentle soft tissue mobilization to L lumpectomy scar  02/11/24: In supine: Short neck, 5 diaphragmatic breaths, R axillary nodes and establishment of interaxillary pathway, L inguinal nodes and establishment of axilloinguinal pathway, then L breast moving fluid towards pathways spending extra time in any areas of fibrosis then retracing all steps then LUE working proximal to distal and retracing all steps.  Gentle soft tissue mobilization to L lumpectomy scar At end of session pt was able to palpate a vertical cord near axillary/breast fold where pt has increased pain so began gentle MFR to this area  02/04/2024 Discussed POC, LOS, treatment interventions. Had Blaire also check redness at breast;agrees no infection. Gave script for pt to look  into getting a different compression bra at [facility] that might fit better. Pt to bring arm and hand garments next visit.    PATIENT EDUCATION:  Education details:POC, LOS, treatment interventions. Had Blaire also check redness at breast;agrees no infection. Gave script for pt to look into getting a different compression bra at [facility]. Dorothyann Gibbs also spoke to her about a Teacher, early years/pre. Person educated: Patient Education method: Explanation Education comprehension: verbalized understanding  HOME EXERCISE PROGRAM:   ASSESSMENT:  CLINICAL IMPRESSION: Pt had made great improvement with pain, but now all pain has returned in the breast and axillary region. Several very tight cords that will not release, and pt did not really feel tightness with MFR techniques today even with arm in varying positions. Pt has several very tender areas in breast likely cording  OBJECTIVE IMPAIRMENTS: decreased activity tolerance, decreased knowledge of condition, increased edema, impaired UE functional use,  postural dysfunction, and pain.   ACTIVITY LIMITATIONS: sleeping, bed mobility, and reach over head  PARTICIPATION LIMITATIONS:  anything requiring use of her arm to reach in certain directions putting pressure on breast  PERSONAL FACTORS: 1-2 comorbidities: Left triple negative breast cancer s/p radiation  are also affecting patient's functional outcome.   REHAB POTENTIAL: Good  CLINICAL DECISION MAKING: Evolving/moderate complexity  EVALUATION COMPLEXITY: Moderate  GOALS: Goals reviewed with patient? Yes  SHORT TERM GOALS Target date: 02/25/2024   1.  Pt will have decreased Left breast pain by 25%% Baseline:  Goal status: MET 03/17/2024 2.  Pt will have decreased breast complaints questionnaire score to 40/80 Baseline:  Goal status: In progress 3.  Pt will be compliant with compression bra/vest to decrease swelling Baseline:  Goal status:MET 03/17/2024 4.  Pt will have new compression garments for arm/hand prn Baseline:  Goal status: In progress LONG TERM GOALS: Target date: 03/17/2024  Pt will have decreased breast pain by 50% for improved activity tolerance Baseline:  Goal status: MET 03/17/2024 2.  Pt will be independent with MLD to left breast and UE prn to decrease swelling Baseline:  Goal status: In Progress  3.  Breast complaints questionnaire will be decreased to no greater than 25 to demonstrate improved swelling Baseline:  Goal status: In Progress 4.  Pt will be able to sleep on her left side without left breast pain Baseline:  Goal status: In Progress  5.  Pt will be tolerant of UE compression garments and will note decreased edema throughout Baseline:  Goal status: partially met; sleeve is more tolerable but glove is not   PLAN:  PT FREQUENCY: 2x/week  PT DURATION: 6 weeks  PLANNED INTERVENTIONS: 97164- PT Re-evaluation, 97110-Therapeutic exercises, 97530- Therapeutic activity, 97112- Neuromuscular re-education, 97535- Self Care, 06301- Manual  therapy, and 97760- Orthotic Fit/training  PLAN FOR NEXT SESSION: do not do IASTM,  Pt to be measured on 03/31/2024,MFR to breast cord and axillary cording, breast MLD, UE MLD, continue to modify garments prn;consider Medi Espirit and adifferent glove if she is not able to get hers to be comfortable for her.(Sent demographics and note to [facility]; (03/17/2024)  Waynette Buttery, PT 03/24/2024, 11:03 AM

## 2024-03-25 ENCOUNTER — Encounter: Payer: Medicare Other | Admitting: Adult Health

## 2024-03-25 ENCOUNTER — Inpatient Hospital Stay: Attending: Hematology and Oncology | Admitting: Adult Health

## 2024-03-25 VITALS — BP 144/69 | HR 79 | Temp 97.5°F | Resp 16 | Wt 218.2 lb

## 2024-03-25 DIAGNOSIS — N644 Mastodynia: Secondary | ICD-10-CM | POA: Diagnosis not present

## 2024-03-25 DIAGNOSIS — Z888 Allergy status to other drugs, medicaments and biological substances status: Secondary | ICD-10-CM | POA: Diagnosis not present

## 2024-03-25 DIAGNOSIS — Z882 Allergy status to sulfonamides status: Secondary | ICD-10-CM | POA: Insufficient documentation

## 2024-03-25 DIAGNOSIS — Z83719 Family history of colon polyps, unspecified: Secondary | ICD-10-CM | POA: Diagnosis not present

## 2024-03-25 DIAGNOSIS — Z1722 Progesterone receptor negative status: Secondary | ICD-10-CM | POA: Diagnosis not present

## 2024-03-25 DIAGNOSIS — Z8349 Family history of other endocrine, nutritional and metabolic diseases: Secondary | ICD-10-CM | POA: Insufficient documentation

## 2024-03-25 DIAGNOSIS — N6332 Unspecified lump in axillary tail of the left breast: Secondary | ICD-10-CM | POA: Diagnosis not present

## 2024-03-25 DIAGNOSIS — Z171 Estrogen receptor negative status [ER-]: Secondary | ICD-10-CM | POA: Insufficient documentation

## 2024-03-25 DIAGNOSIS — Z8262 Family history of osteoporosis: Secondary | ICD-10-CM | POA: Diagnosis not present

## 2024-03-25 DIAGNOSIS — Z881 Allergy status to other antibiotic agents status: Secondary | ICD-10-CM | POA: Insufficient documentation

## 2024-03-25 DIAGNOSIS — Z860101 Personal history of adenomatous and serrated colon polyps: Secondary | ICD-10-CM | POA: Diagnosis not present

## 2024-03-25 DIAGNOSIS — Z90710 Acquired absence of both cervix and uterus: Secondary | ICD-10-CM | POA: Diagnosis not present

## 2024-03-25 DIAGNOSIS — Z8249 Family history of ischemic heart disease and other diseases of the circulatory system: Secondary | ICD-10-CM | POA: Insufficient documentation

## 2024-03-25 DIAGNOSIS — Z9049 Acquired absence of other specified parts of digestive tract: Secondary | ICD-10-CM | POA: Insufficient documentation

## 2024-03-25 DIAGNOSIS — Z801 Family history of malignant neoplasm of trachea, bronchus and lung: Secondary | ICD-10-CM | POA: Diagnosis not present

## 2024-03-25 DIAGNOSIS — Z1732 Human epidermal growth factor receptor 2 negative status: Secondary | ICD-10-CM | POA: Diagnosis not present

## 2024-03-25 DIAGNOSIS — Z8379 Family history of other diseases of the digestive system: Secondary | ICD-10-CM | POA: Diagnosis not present

## 2024-03-25 DIAGNOSIS — Z87891 Personal history of nicotine dependence: Secondary | ICD-10-CM | POA: Insufficient documentation

## 2024-03-25 DIAGNOSIS — C50412 Malignant neoplasm of upper-outer quadrant of left female breast: Secondary | ICD-10-CM | POA: Insufficient documentation

## 2024-03-25 DIAGNOSIS — Z79899 Other long term (current) drug therapy: Secondary | ICD-10-CM | POA: Diagnosis not present

## 2024-03-25 NOTE — Progress Notes (Signed)
 SURVIVORSHIP VISIT:  BRIEF ONCOLOGIC HISTORY:  Oncology History  Malignant neoplasm of upper-outer quadrant of left breast in female, estrogen receptor negative (HCC)  06/03/2023 Mammogram   Screening mammogram bilaterally showed possible mass in the left breast warranting further evaluation.  Diagnostic mammogram confirmed highly suspicious 2.2 cm upper outer left breast mass, no abnormal appearing left axillary lymph nodes   06/23/2023 Pathology Results   Pathology results from the left breast needle core biopsy showed grade 3 invasive poorly differentiated ductal carcinoma, focal high-grade DCIS, cribriform type, negative for angiolymphatic invasion, negative for micro calcs, prognostic showed ER 0% negative, PR 0% negative, HER2 negative and Ki-67 of 95%.   06/30/2023 Initial Diagnosis   Malignant neoplasm of upper-outer quadrant of left breast in female, estrogen receptor negative (HCC)    Genetic Testing   [facility] Custom Panel+RNA was Negative. Of note, a variant of uncertain significance was detected in the MSH3 gene (c.1027+4T>C (Intronic)). Report date is 07/10/2023.  The Custom Hereditary Cancers Panel offered by [facility] includes sequencing and/or deletion duplication testing of the following 46 genes: APC, ATM, AXIN2, BAP1, BARD1, BMPR1A, BRCA1, BRCA2, BRIP1, CDH1, CDK4, CDKN2A (p14ARF and p16INK4a only), CHEK2, CTNNA1, DICER1, EPCAM (Deletion/duplication testing only), FH, GREM1 (promoter region duplication testing only), HOXB13, KIT, MBD4, MEN1, MLH1, MSH2, MSH3, MSH6, MUTYH, NF1, NHTL1, PALB2, PDGFRA, PMS2, POLD1, POLE, PRKAR1A, PTEN, RAD51C, RAD51D, RET, SMAD4, SMARCA4. STK11, TP53, TSC1, TSC2, and VHL.    09/25/2023 - 10/17/2023 Radiation Therapy   Plan Name: Breast_L Site: Breast, Left Technique: 3D Mode: Photon Dose Per Fraction: 2.67 Gy Prescribed Dose (Delivered / Prescribed): 42.72 Gy / 42.72 Gy Prescribed Fxs (Delivered / Prescribed): 16 / 16     INTERVAL HISTORY:  Ms.  Bebee to review her survivorship care plan detailing her treatment course for breast cancer, as well as monitoring long-term side effects of that treatment, education regarding health maintenance, screening, and overall wellness and health promotion.     Overall, Ms. Jaworowski reports feeling moderately well.  She notes a persistent pain and swelling in the breast post-surgery and radiation therapy. The discomfort has been ongoing for eight months since the surgery. The patient reports the pain as 6 out of 10, worsening with touch. The swelling is described as fluid-filled, causing the breast to appear significantly enlarged. The patient has been undergoing physical therapy, currently in the fourth round, with sessions twice a week. Despite therapy, the patient reports no significant improvement in symptoms. The patient also mentions having a lot of cording, indicating possible lymphedema. The patient has previously had fluid aspirated from the breast, but the swelling reoccurred. The patient expresses frustration with the lack of progress and relief from symptoms.  REVIEW OF SYSTEMS:  Review of Systems  Constitutional:  Negative for appetite change, chills, fatigue, fever and unexpected weight change.  HENT:   Negative for hearing loss, lump/mass and trouble swallowing.   Eyes:  Negative for eye problems and icterus.  Respiratory:  Negative for chest tightness, cough and shortness of breath.   Cardiovascular:  Negative for chest pain, leg swelling and palpitations.  Gastrointestinal:  Negative for abdominal distention, abdominal pain, constipation, diarrhea, nausea and vomiting.  Endocrine: Negative for hot flashes.  Genitourinary:  Negative for difficulty urinating.   Musculoskeletal:  Negative for arthralgias.  Skin:  Negative for itching and rash.  Neurological:  Negative for dizziness, extremity weakness, headaches and numbness.  Hematological:  Negative for adenopathy. Does not bruise/bleed  easily.  Psychiatric/Behavioral:  Negative for depression. The patient  is not nervous/anxious.    Breast: Denies any new nodularity, masses, tenderness, nipple changes, or nipple discharge.       PAST MEDICAL/SURGICAL HISTORY:  Past Medical History:  Diagnosis Date   Adenomatous colon polyp    Allergy    Anxiety    Arthritis    bilateral hands. lower back   Cancer [facility])    skin   Cataract    bilateral   COPD (chronic obstructive pulmonary disease) (HCC)    Depression    Diverticulosis    Endometriosis    GERD (gastroesophageal reflux disease)    Glaucoma    Headache    Migraines   Hiatal hernia    History of left breast cancer 05/2023   History of radiation therapy    Left breast- 09/25/23-10/17/23- Dr. Antony Blackbird   IBS (irritable bowel syndrome)    Inguinal hernia    Pneumothorax 1987   PONV (postoperative nausea and vomiting)    Past Surgical History:  Procedure Laterality Date   ABDOMINAL HYSTERECTOMY  1992   ANTERIOR CERVICAL DECOMP/DISCECTOMY FUSION N/A 09/23/2022   Procedure: Anterior Decompression Fusion,PLATE/SCREWS Cervical five-six; REMOVAL OLD PLATE;  Surgeon: Tressie Stalker, MD;  Location: [facility] OR;  Service: Neurosurgery;  Laterality: N/A;   APPENDECTOMY  1992   BACK SURGERY     cervical   BREAST BIOPSY Left 06/23/2023   [place] LT BREAST BX W LOC DEV 1ST LESION IMG BX SPEC US GUIDE 06/23/2023 [facility] MAMMOGRAPHY   BREAST BIOPSY Left 07/28/2023   [place] LT RADIOACTIVE SEED LOC 07/28/2023 [facility] MAMMOGRAPHY   BREAST LUMPECTOMY WITH RADIOACTIVE SEED AND SENTINEL LYMPH NODE BIOPSY Left 07/29/2023   Procedure: LEFT BREAST SEED LUMPECTOMY WITH LEFT SENTINEL LYMPH NODE MAPPING;  Surgeon: Harriette Bouillon, MD;  Location: [facility] OR;  Service: General;  Laterality: Left;  PEC BLOCK   CATARACT EXTRACTION Bilateral    2021, 2022   COLONOSCOPY  2023   PLEURAL SCARIFICATION     ruptured disk     torn tendon     right arm   TUBAL LIGATION       ALLERGIES:  Allergies   Allergen Reactions   Bupropion Other (See Comments)    headaches   Cefaclor Diarrhea    severe diarrhea   Elemental Sulfur Other (See Comments)    Thrush   Sulfa Antibiotics Other (See Comments)    thrush   Latex Rash and Other (See Comments)    blister    Ofloxacin Diarrhea     CURRENT MEDICATIONS:  Outpatient Encounter Medications as of 03/25/2024  Medication Sig   acetaminophen (TYLENOL) 325 MG tablet Take 325 mg by mouth every 6 (six) hours as needed.   albuterol (VENTOLIN HFA) 108 (90 Base) MCG/ACT inhaler Inhale 2 puffs into the lungs every 6 (six) hours as needed.   ALPRAZolam (XANAX) 0.25 MG tablet Take 0.25 mg by mouth 3 (three) times daily as needed for anxiety.   aspirin EC 81 MG tablet Take 81 mg by mouth in the morning. Swallow whole.   cetirizine (ZYRTEC) 10 MG tablet Take 1 tablet (10 mg total) by mouth daily.   Cholecalciferol (VITAMIN D PO) Take 5,000 Units by mouth in the morning.   denosumab (PROLIA) 60 MG/ML SOSY injection Inject 60 mg into the skin every 6 (six) months.   famotidine (PEPCID) 20 MG tablet TAKE 1 TABLET (20 MG TOTAL) BY MOUTH 2 (TWO) TIMES DAILY. AS NEEDED   fluticasone (FLONASE) 50 MCG/ACT nasal spray Place into both nostrils.  Ibuprofen (ADVIL) 200 MG CAPS Take 1 capsule by mouth as needed.   inclisiran (LEQVIO) 284 MG/1.5ML SOSY injection Inject 284 mg into the skin every 6 (six) months. Every three months   loperamide (IMODIUM) 2 MG capsule Take 2 mg by mouth as needed for diarrhea or loose stools.   promethazine (PHENERGAN) 12.5 MG suppository Place 12.5 mg rectally every 6 (six) hours as needed for nausea or vomiting.   sertraline (ZOLOFT) 100 MG tablet Take 50 mg by mouth in the morning.   traZODone (DESYREL) 100 MG tablet Take 100 mg by mouth at bedtime.   XDEMVY 0.25 % SOLN Place 1 drop into both eyes in the morning and at bedtime.   No facility-administered encounter medications on file as of 03/25/2024.     ONCOLOGIC FAMILY  HISTORY:  Family History  Problem Relation Age of Onset   Heart failure Mother    Crohn's disease Sister    Osteoporosis Sister    Neuropathy Sister    Colon polyps Sister    Lung cancer Sister 77       smoked   Thyroid disease Sister    Thyroid cancer Maternal Grandmother    Colon cancer Neg Hx    Breast cancer Neg Hx    Esophageal cancer Neg Hx    Pancreatic cancer Neg Hx    Stomach cancer Neg Hx      SOCIAL HISTORY:  Social History   Socioeconomic History   Marital status: Married    Spouse name: Not on file   Number of children: 2   Years of education: Not on file   Highest education level: Not on file  Occupational History   Occupation: locksmith  Tobacco Use   Smoking status: Former    Current packs/day: 0.00    Average packs/day: 1.5 packs/day for 40.1 years (60.2 ttl pk-yrs)    Types: Cigarettes    Start date: 12/31/1971    Quit date: 02/10/2012    Years since quitting: 12.1   Smokeless tobacco: Never  Vaping Use   Vaping status: Never Used  Substance and Sexual Activity   Alcohol use: No   Drug use: No   Sexual activity: Never  Other Topics Concern   Not on file  Social History Narrative   Not on file   Social Drivers of Health   Financial Resource Strain: Not on file  Food Insecurity: No Food Insecurity (09/15/2023)   Hunger Vital Sign    Worried About Running Out of Food in the Last Year: Never true    Ran Out of Food in the Last Year: Never true  Transportation Needs: No Transportation Needs (09/15/2023)   PRAPARE - Administrator, Civil Service (Medical): No    Lack of Transportation (Non-Medical): No  Physical Activity: Not on file  Stress: Not on file  Social Connections: Unknown (05/12/2022)   [facility]   Social Network    Social Network: Not on file  Intimate Partner Violence: Not At Risk (09/15/2023)   Humiliation, Afraid, Rape, and Kick questionnaire    Fear of Current or Ex-Partner: No     Emotionally Abused: No    Physically Abused: No    Sexually Abused: No     OBSERVATIONS/OBJECTIVE:  BP (!) 144/69 (BP Location: Right Arm, Patient Position: Sitting)   Pulse 79   Temp (!) 97.5 F (36.4 C) (Temporal)   Resp 16   Wt 218 lb 3.2 oz (99 kg)  SpO2 94%   BMI 31.76 kg/m  GENERAL: Patient is a well appearing female in no acute distress HEENT:  Sclerae anicteric.  Oropharynx clear and moist. No ulcerations or evidence of oropharyngeal candidiasis. Neck is supple.  NODES:  No cervical, supraclavicular, or axillary lymphadenopathy palpated.  BREAST EXAM:  right breast benign.  Left breast with swelling and tenderness, worse in left axilla with cording noted, no sign of recurrence LUNGS:  Clear to auscultation bilaterally.  No wheezes or rhonchi. HEART:  Regular rate and rhythm. No murmur appreciated. ABDOMEN:  Soft, nontender.  Positive, normoactive bowel sounds. No organomegaly palpated. MSK:  No focal spinal tenderness to palpation. Full range of motion bilaterally in the upper extremities. EXTREMITIES:  No peripheral edema.   SKIN:  Clear with no obvious rashes or skin changes. No nail dyscrasia. NEURO:  Nonfocal. Well oriented.  Appropriate affect.   LABORATORY DATA:  None for this visit.  DIAGNOSTIC IMAGING:  None for this visit.      ASSESSMENT AND PLAN:  Ms.. Mcdaris is a pleasant 73 y.o. female with Stage IIB left breast invasive ductal carcinoma, ER-/PR-/HER2-, diagnosed in 06/2023, treated with lumpectomy, adjuvant radiation therapy.  She presents to the Survivorship Clinic for our initial meeting and routine follow-up post-completion of treatment for breast cancer.    1. Stage IIB left breast cancer:  Ms. Arvizu is continuing to recover from definitive treatment for breast cancer. Her mammogram is due 05/2024; orders placed today.   Today, a comprehensive survivorship care plan and treatment summary was reviewed with the patient today detailing her breast cancer  diagnosis, treatment course, potential late/long-term effects of treatment, appropriate follow-up care with recommendations for the future, and patient education resources.  A copy of this summary, along with a letter will be sent to the patient's primary care provider via mail/fax/In Basket message after today's visit.    2. Breast pain: I sent a message to PT and to Dr. Rosezena Sensor office.  Dr.Cornett is going to get Emilyann in for follow up and evaluation to help partner with PT and our office to determine next best steps for her breast pain and management.    3. Lung nodules on CT: repeat imaging scheduled next month  4. Bone health:   She was given education on specific activities to promote bone health.  5. Cancer screening:  Due to Ms. Matlin's history and her age, she should receive screening for skin cancers, colon cancer.  The information and recommendations are listed on the patient's comprehensive care plan/treatment summary and were reviewed in detail with the patient.    6. Health maintenance and wellness promotion: Ms. Klang was encouraged to consume 5-7 servings of fruits and vegetables per day. We reviewed the "Nutrition Rainbow" handout.  She was also encouraged to engage in moderate to vigorous exercise for 30 minutes per day most days of the week.  She was instructed to limit her alcohol consumption and continue to abstain from tobacco use.     7. Support services/counseling: It is not uncommon for this period of the patient's cancer care trajectory to be one of many emotions and stressors.   She was given information regarding our available services and encouraged to contact me with any questions or for help enrolling in any of our support group/programs.    Follow up instructions:    -Return to cancer center 04/2024 for f/u with Dr. Al Pimple  -Mammogram due in 05/2024 -She is welcome to return back to the Survivorship Clinic  at any time; no additional follow-up needed at this time.   -Consider referral back to survivorship as a long-term survivor for continued surveillance  The patient was provided an opportunity to ask questions and all were answered. The patient agreed with the plan and demonstrated an understanding of the instructions.   Total encounter time:45 minutes*in face-to-face visit time, chart review, lab review, care coordination, order entry, and documentation of the encounter time.    Lillard Anes, NP 03/25/24 12:47 PM Medical Oncology and Hematology [facility] [address] [place], [place] [address] Tel. [phone]    Fax. [phone]  *Total Encounter Time as defined by the Centers for Medicare and Medicaid Services includes, in addition to the face-to-face time of a patient visit (documented in the note above) non-face-to-face time: obtaining and reviewing outside history, ordering and reviewing medications, tests or procedures, care coordination (communications with other health care professionals or caregivers) and documentation in the medical record.

## 2024-03-27 ENCOUNTER — Encounter: Payer: Self-pay | Admitting: Pulmonary Disease

## 2024-03-27 ENCOUNTER — Encounter: Payer: Self-pay | Admitting: Adult Health

## 2024-03-29 ENCOUNTER — Ambulatory Visit

## 2024-03-29 DIAGNOSIS — R293 Abnormal posture: Secondary | ICD-10-CM | POA: Diagnosis not present

## 2024-03-29 DIAGNOSIS — Z483 Aftercare following surgery for neoplasm: Secondary | ICD-10-CM | POA: Diagnosis not present

## 2024-03-29 DIAGNOSIS — I890 Lymphedema, not elsewhere classified: Secondary | ICD-10-CM | POA: Diagnosis not present

## 2024-03-29 DIAGNOSIS — Z171 Estrogen receptor negative status [ER-]: Secondary | ICD-10-CM

## 2024-03-29 DIAGNOSIS — N644 Mastodynia: Secondary | ICD-10-CM

## 2024-03-29 DIAGNOSIS — M25612 Stiffness of left shoulder, not elsewhere classified: Secondary | ICD-10-CM | POA: Diagnosis not present

## 2024-03-29 DIAGNOSIS — R600 Localized edema: Secondary | ICD-10-CM

## 2024-03-29 DIAGNOSIS — C50412 Malignant neoplasm of upper-outer quadrant of left female breast: Secondary | ICD-10-CM | POA: Diagnosis not present

## 2024-03-29 NOTE — Therapy (Signed)
 OUTPATIENT PHYSICAL THERAPY  UPPER EXTREMITY ONCOLOGY  Patient Name: [name] [name] MRN: [id number] DOB:1951/11/21, 73 y.o., female Today's Date: 03/29/2024  END OF SESSION:  PT End of Session - 03/29/24 1206     Visit Number 11    Number of Visits 18    Date for PT Re-Evaluation 04/14/24    PT Start Time 1204    PT Stop Time 1303    PT Time Calculation (min) 59 min    Activity Tolerance Patient tolerated treatment well    Behavior During Therapy WFL for tasks assessed/performed              Past Medical History:  Diagnosis Date   Adenomatous colon polyp    Allergy    Anxiety    Arthritis    bilateral hands. lower back   Cancer [facility])    skin   Cataract    bilateral   COPD (chronic obstructive pulmonary disease) (HCC)    Depression    Diverticulosis    Endometriosis    GERD (gastroesophageal reflux disease)    Glaucoma    Headache    Migraines   Hiatal hernia    History of left breast cancer 05/2023   History of radiation therapy    Left breast- 09/25/23-10/17/23- Dr. Antony Blackbird   IBS (irritable bowel syndrome)    Inguinal hernia    Pneumothorax 1987   PONV (postoperative nausea and vomiting)    Past Surgical History:  Procedure Laterality Date   ABDOMINAL HYSTERECTOMY  1992   ANTERIOR CERVICAL DECOMP/DISCECTOMY FUSION N/A 09/23/2022   Procedure: Anterior Decompression Fusion,PLATE/SCREWS Cervical five-six; REMOVAL OLD PLATE;  Surgeon: Tressie Stalker, MD;  Location: [facility] OR;  Service: Neurosurgery;  Laterality: N/A;   APPENDECTOMY  1992   BACK SURGERY     cervical   BREAST BIOPSY Left 06/23/2023   [place] LT BREAST BX W LOC DEV 1ST LESION IMG BX SPEC US GUIDE 06/23/2023 [facility]   BREAST BIOPSY Left 07/28/2023   [place] LT RADIOACTIVE SEED LOC 07/28/2023 [facility]   BREAST LUMPECTOMY WITH RADIOACTIVE SEED AND SENTINEL LYMPH NODE BIOPSY Left 07/29/2023   Procedure: LEFT BREAST SEED LUMPECTOMY WITH LEFT SENTINEL LYMPH NODE MAPPING;  Surgeon:  Harriette Bouillon, MD;  Location: [facility] OR;  Service: General;  Laterality: Left;  PEC BLOCK   CATARACT EXTRACTION Bilateral    2021, 2022   COLONOSCOPY  2023   PLEURAL SCARIFICATION     ruptured disk     torn tendon     right arm   TUBAL LIGATION     Patient Active Problem List   Diagnosis Date Noted   Genetic testing 07/11/2023   Malignant neoplasm of upper-outer quadrant of left breast in female, estrogen receptor negative (HCC) 06/30/2023   Cervical spondylosis with radiculopathy 09/23/2022   Elevated low-density lipoprotein level 06/14/2022   Coronary arteriosclerosis 06/14/2022   Adverse reaction to antihyperlipidemic drug, initial encounter 06/14/2022   Diarrhea 01/27/2019   Change in bowel habits 01/05/2019   Left inguinal hernia 06/27/2014   Bloating 05/17/2013   Constipation 05/17/2013   Pneumothorax 10/17/2008   HIATAL HERNIA 10/17/2008   DIVERTICULOSIS, COLON 10/17/2008   ENDOMETRIOSIS 10/17/2008   Right lower quadrant abdominal pain 10/17/2008   DEPRESSION, HX OF 10/17/2008   History of colonic polyps 10/17/2008   PNEUMOTHORAX 10/17/2008    PCP: Dr Renne Crigler   REFERRING PROVIDER: Rachel Moulds, MD  REFERRING DIAG: Left Breast Cancer  THERAPY DIAG:  Lymphedema, not elsewhere classified  Breast pain  Malignant neoplasm of upper-outer quadrant of left breast in female, estrogen receptor negative (HCC)  Aftercare following surgery for neoplasm  Abnormal posture  Stiffness of left shoulder, not elsewhere classified  Localized edema  ONSET DATE: 1 year ago  Rationale for Evaluation and Treatment: Rehabilitation  SUBJECTIVE:                                                                                                                                                                                           SUBJECTIVE STATEMENT:    I feel about the same as I did last time. I'll see someone here Wed for my compression garments to be re assessed.    PERTINENT HISTORY:  Patient was diagnosed on 08/15/2023 with left grade 3 invasive ductal carcinoma breast cancer. She underwent a left lumpectomy and sentinel node biopsy (13 negative nodes) on 07/29/2023. It is triple negative with a Ki67 of 95%. She has a history of a cervical fusion in 08/2022. She ended radiation in October 2024. She had a breast seroma aspirated of 10 cc on 01/22/2024 . She has had prior PT treatment for cording and left UE/breast swelling  PAIN:  Are you having pain?  NPRS scale: 2/10 Pain location: left lateral breast/chest Pain orientation: Left and Lateral  PAIN TYPE: burning and rushing, pressure  Aggravating factors: laying on left side,arm rubbing side, touching it,raising arm Relieving factors: propping her arm, advil  PRECAUTIONS:  L UE lymphedema risk, cervical fusion C6, C7   RED FLAGS: Compression fracture: Yes: T12    WEIGHT BEARING RESTRICTIONS: No  FALLS:  Has patient fallen in last 6 months? No  LIVING ENVIRONMENT: Lives with: lives with their spouse Lives in: House/apartment Stairs: Yes; External: 5 steps; can reach both Has following equipment at home: None  OCCUPATION: locksmith  LEISURE: play cards,   HAND DOMINANCE: right   PRIOR LEVEL OF FUNCTION: Independent  PATIENT GOALS: Get rid of pain and swelling   OBJECTIVE: Note: Objective measures were completed at Evaluation unless otherwise noted.  COGNITION: Overall cognitive status: Within functional limits for tasks assessed   PALPATION: Tender lateral breast, inferior breast,axilla  OBSERVATIONS / OTHER ASSESSMENTS: generalized swelling,mild redness left breast. Nipple red and irritated looking, significant indentation on left from compression bra  SENSATION: Light touch: Deficits      POSTURE: forward head, rounded shoulders  UPPER EXTREMITY AROM/PROM:  A/PROM RIGHT   eval   Shoulder extension   Shoulder flexion 155,   Shoulder abduction 165,  Shoulder  internal rotation   Shoulder external rotation     (Blank rows = not tested)  A/PROM LEFT  eval  Shoulder extension   Shoulder flexion 153  Shoulder abduction 162  Shoulder internal rotation   Shoulder external rotation     (Blank rows = not tested)  CERVICAL AROM: All within functional limits:     UPPER EXTREMITY STRENGTH:   LYMPHEDEMA ASSESSMENTS:   SURGERY TYPE/DATE: 07/29/23 L lumpectomy and SLNB   NUMBER OF LYMPH NODES REMOVED: 0+/13  CHEMOTHERAPY: no pt declined  RADIATION:YES, completed Oct 2024  HORMONE TREATMENT: None  INFECTIONS: none   LYMPHEDEMA ASSESSMENTS:   LANDMARK RIGHT  eval  At axilla  33.4  15 cm proximal to olecranon process 31.3  10 cm proximal to olecranon process 29.5  Olecranon process 26.4  15 cm proximal to ulnar styloid process 24.2  10 cm proximal to ulnar styloid process 21.0  Just proximal to ulnar styloid process 16.8  Across hand at thumb web space 19.8  At base of 2nd digit 6.4  (Blank rows = not tested)  LANDMARK LEFT  eval LEFT 02/25/2024 LEFT 03/08/2024 LEFT 3/19/20256.3  At axilla  33.2 33.9 32.5 32.5  15 cm proximal to olecranon process 32.2 31.3  31.3  10 cm proximal to olecranon process 30.5 30.6 30.5 30.3  Olecranon process 27.9 27.6 27.4 27.6  15 cm proximal to ulnar styloid process 24.5 24.1 24.3 24.0  10 cm proximal to ulnar styloid process 21.5 21 21.0 20.9  Just proximal to ulnar styloid process 18.65 17.5 17.7 18.3  Across hand at thumb web space 21.0 20.5  21.0  At base of 2nd digit 6.35 6.3 6.3 6.3  (Blank rows = not tested)   FUNCTIONAL TESTS:    GAIT:WNL   L-DEX LYMPHEDEMA SCREENING: The patient was assessed using the L-Dex machine today to produce a lymphedema index baseline score. The patient will be reassessed on a regular basis (typically every 3 months) to obtain new L-Dex scores. If the score is > 6.5 points away from his/her baseline score indicating onset of subclinical lymphedema, it  will be recommended to wear a compression garment for 4 weeks, 12 hours per day and then be reassessed. If the score continues to be > 6.5 points from baseline at reassessment, we will initiate lymphedema treatment. Assessing in this manner has a 95% rate of preventing clinically significant lymphedema.  QUICK DASH SURVEY: 48%    BREAST COMPLAINTS QUESTIONNAIRE Pain:8 Heaviness:10 Swollen feeling:10 Tense Skin:8 Redness:3 Bra Print:8 Size of Pores:5 Hard feeling: 8 Total:    60 /80 A Score over 9 indicates lymphedema issues in the breast     The patient was assessed using the L-Dex machine today to produce a lymphedema index baseline score. The patient will be reassessed on a regular basis (typically every 3 months) to obtain new L-Dex scores. If the score is > 6.5 points away from his/her baseline score indicating onset of subclinical lymphedema, it will be recommended to wear a compression garment for 4 weeks, 12 hours per day and then be reassessed. If the score continues to be > 6.5 points from baseline at reassessment, we will initiate lymphedema treatment. Assessing in this manner has a 95% rate of preventing clinically significant lymphedema.  TREATMENT DATE:  Pt permission and consent throughout each step of examination and treatment with modification and draping if requested when working on sensitive areas 03/29/24: Manual Therapy MFR to Left axilla and medial upper arm to release cord; and to left breast all done very gently and as pt was able to tolerate due to increased hypersensitivity, more so at lateral aspects PROM left shoulder flex, scaption, abd and D2 with scapular depression throughout by therapist In supine: Short neck, superficial and deep abdominals, Rt axillary and pectoral nodes, anterior intact thorax sequence, and establishment of anterior inter-axillary  pathway, Lt inguinal nodes and establishment of Lt axillo-inguinal pathway, then Lt breast moving fluid towards pathways, then left UE outer arm, medial to lateral, outer arm again spending extra time in any areas of fibrosis then retracing pathways and ending with LN's.  03/24/2026 MFR to Left axilla and medial upper arm to release cord; and to left breast briefly PROM left shoulder flex, scaption, abd In supine: Short neck, 5 diaphragmatic breaths, R axillary nodes and establishment of interaxillary pathway, L inguinal nodes and establishment of axilloinguinal pathway, then L breast moving fluid towards pathways, then left UE outer arm, medial to lateral, outer arm again spending extra time in any areas of fibrosis then retracing pathways and  ending with LN's. Showed single and double arm pectoral stretch in doorway to stretch left pectorals  03/17/2024 Discussed treatment improvements and remaining deficits Measured left UE and performed SOZO screen; still in the red MFR to Left axilla and medial upper arm to release cord; In supine: Short neck, 5 diaphragmatic breaths, R axillary nodes and establishment of interaxillary pathway, L inguinal nodes and establishment of axilloinguinal pathway, then L breast moving fluid towards pathways, then left UE outer arm, medial to lateral, outer arm again spending extra time in any areas of fibrosis then retracing pathways and  ending with LN's.  Discussed [facility] and pt would like me to send demographics 03/11/2024 MFR to right axilla, upper arm, cording at lateral breast running vertically, and to lumpectomy scar In supine: Short neck, 5 diaphragmatic breaths, R axillary nodes and establishment of interaxillary pathway, L inguinal nodes and establishment of axilloinguinal pathway, then L breast moving fluid towards pathways, then left UE outer arm, medial to lateral, outer arm again spending extra time in any areas of fibrosis then retracing pathways and   ending with LN's.  Discussed another sleeve/glove with pt; measured for Medi espirit, exostrong but pt was out of range for the glove Assisted pt with donning   03/08/2024 Measured left arm MFR to right axillary, upper arm, cording, and briefly to left breast cording running vertically from clavicle, and horizontally across breast In supine: Short neck, 5 diaphragmatic breaths, R axillary nodes and establishment of interaxillary pathway, L inguinal nodes and establishment of axilloinguinal pathway, then L breast moving fluid towards pathways, then left UE outer arm, medial to lateral, outer arm again spending extra time in any areas of fibrosis then retracing pathways and  ending with LN's.  Discussed another sleeve/glove with pt; consider Medi Espirit due to fabric made more for sensitive skin per [place].  Gave pt some more thin comprilan foam to place under silicone border and discussed wearing sleeve for just a few hours at a time and taking glove off more frequently as needed  to prevent finger swelling/pain. Also discussed stretching end of fingers with a large marker or similar. Discussed bandaging as an option as well, and thought about doing it  today but was hopeful she could meet with Rodman Pickle today. Rodman Pickle had other appts as did pt so I spoke with Rodman Pickle only  03/04/24: MFR to cording at L lateral breast, axilla and chest IASTM using boomerang tool to L pec with cocoa butter then to cording at left breast and axilla with improvements noted in facial adhesions In supine: Short neck, 5 diaphragmatic breaths, R axillary nodes and establishment of interaxillary pathway, L inguinal nodes and establishment of axilloinguinal pathway, then L breast moving fluid towards pathways and LUE working proximal and moving distally then retracing all steps.  Repeated SOZO which was still in the red    The patient was assessed using the L-Dex machine today to produce a lymphedema index baseline score. The  patient will be reassessed on a regular basis (typically every 3 months) to obtain new L-Dex scores. If the score is > 6.5 points away from his/her baseline score indicating onset of subclinical lymphedema, it will be recommended to wear a compression garment for 4 weeks, 12 hours per day and then be reassessed. If the score continues to be > 6.5 points from baseline at reassessment, we will initiate lymphedema treatment. Assessing in this manner has a 95% rate of preventing clinically significant lymphedema.   02/26/2023 02/27/2024 MFR to right axillary, upper arm, and forearm cording, and briefly to left breast cording running vertically from clavicle, and horizontally across breast In supine: Short neck, 5 diaphragmatic breaths, R axillary nodes and establishment of interaxillary pathway, L inguinal nodes and establishment of axilloinguinal pathway, then L breast moving fluid towards pathways spending extra time in any areas of fibrosis then retracing all steps then ending with LN's.  Small piece of comprilan soft foam made for medial arm sleeve under silicone. Peach dot foam in stockinette made and placed at dorsum of hand, but will be very difficult for pt to get in herself. Advised she can try with flat side down or dot side down but to only wear if comfortable. Advised to remove sleeve in afternoon if it is uncomfortable, and replace in an hour or so after arm rests. Repeat SOZO next   02/25/2024 Measured left UE with some reduction noted Pt getting discomfort at top of sleeve and swelling top of hand with glove. Possibly sleeve laying on tight cording noted there;made small piece of 1/4 in foam to put under top band of sleeve. Pt will put glove on and off to try and avoid dorsum of hand swelling. Maybe foam pad in glove would help dorsum of hand. Lowered strap on bra to try and get axillary part from pinching at her armpit. In supine: Short neck, 5 diaphragmatic breaths, R axillary nodes and  establishment of interaxillary pathway, L inguinal nodes and establishment of axilloinguinal pathway, then L breast moving fluid towards pathways spending extra time in any areas of fibrosis then retracing all steps then ending with LN's. Did some in right SL to left lateral breast and AI pathway and ended with LN's MFR to left axillary cording. Showed pt shoulder extension with wrist ext to stretch cords 02/16/24: Checked fit of sleeve and glove and they seem okay - not too tight  In supine: Short neck, 5 diaphragmatic breaths, R axillary nodes and establishment of interaxillary pathway, L inguinal nodes and establishment of axilloinguinal pathway, then L breast moving fluid towards pathways spending extra time in any areas of fibrosis then retracing all steps then LUE working proximal to distal and retracing all steps.  Gentle soft  tissue mobilization to L lumpectomy scar  02/11/24: In supine: Short neck, 5 diaphragmatic breaths, R axillary nodes and establishment of interaxillary pathway, L inguinal nodes and establishment of axilloinguinal pathway, then L breast moving fluid towards pathways spending extra time in any areas of fibrosis then retracing all steps then LUE working proximal to distal and retracing all steps.  Gentle soft tissue mobilization to L lumpectomy scar At end of session pt was able to palpate a vertical cord near axillary/breast fold where pt has increased pain so began gentle MFR to this area  02/04/2024 Discussed POC, LOS, treatment interventions. Had Blaire also check redness at breast;agrees no infection. Gave script for pt to look into getting a different compression bra at [facility] that might fit better. Pt to bring arm and hand garments next visit.    PATIENT EDUCATION:  Education details:POC, LOS, treatment interventions. Had Blaire also check redness at breast;agrees no infection. Gave script for pt to look into getting a different compression bra at [facility]. Dorothyann Gibbs also spoke to her about a Teacher, early years/pre. Person educated: Patient Education method: Explanation Education comprehension: verbalized understanding  HOME EXERCISE PROGRAM:   ASSESSMENT:  CLINICAL IMPRESSION: Continued with Lt breast MLD and MFR for cording in axilla and breast. Hard to palpate these but pt very hypersensitive at typical areas of cording. Was able to increase pressure by end of session with pt tolerating this but had to progress to this slowly due to the hypersensitivity.   OBJECTIVE IMPAIRMENTS: decreased activity tolerance, decreased knowledge of condition, increased edema, impaired UE functional use, postural dysfunction, and pain.   ACTIVITY LIMITATIONS: sleeping, bed mobility, and reach over head  PARTICIPATION LIMITATIONS:  anything requiring use of her arm to reach in certain directions putting pressure on breast  PERSONAL FACTORS: 1-2 comorbidities: Left triple negative breast cancer s/p radiation  are also affecting patient's functional outcome.   REHAB POTENTIAL: Good  CLINICAL DECISION MAKING: Evolving/moderate complexity  EVALUATION COMPLEXITY: Moderate  GOALS: Goals reviewed with patient? Yes  SHORT TERM GOALS Target date: 02/25/2024   1.  Pt will have decreased Left breast pain by 25%% Baseline:  Goal status: MET 03/17/2024 2.  Pt will have decreased breast complaints questionnaire score to 40/80 Baseline:  Goal status: In progress 3.  Pt will be compliant with compression bra/vest to decrease swelling Baseline:  Goal status:MET 03/17/2024 4.  Pt will have new compression garments for arm/hand prn Baseline:  Goal status: In progress LONG TERM GOALS: Target date: 03/17/2024  Pt will have decreased breast pain by 50% for improved activity tolerance Baseline:  Goal status: MET 03/17/2024 2.  Pt will be independent with MLD to left breast and UE prn to decrease swelling Baseline:  Goal status: In Progress  3.  Breast complaints  questionnaire will be decreased to no greater than 25 to demonstrate improved swelling Baseline:  Goal status: In Progress 4.  Pt will be able to sleep on her left side without left breast pain Baseline:  Goal status: In Progress  5.  Pt will be tolerant of UE compression garments and will note decreased edema throughout Baseline:  Goal status: partially met; sleeve is more tolerable but glove is not   PLAN:  PT FREQUENCY: 2x/week  PT DURATION: 6 weeks  PLANNED INTERVENTIONS: 97164- PT Re-evaluation, 97110-Therapeutic exercises, 97530- Therapeutic activity, 97112- Neuromuscular re-education, 97535- Self Care, 16109- Manual therapy, and 97760- Orthotic Fit/training  PLAN FOR NEXT SESSION: do not do IASTM,  Pt to  be measured on 03/31/2024,MFR to breast cord and axillary cording, breast MLD, UE MLD, continue to modify garments prn;consider Medi Espirit and adifferent glove if she is not able to get hers to be comfortable for her.(Sent demographics and note to [facility]; (03/17/2024)  Hermenia Bers, PTA 03/29/2024, 1:11 PM

## 2024-03-31 ENCOUNTER — Ambulatory Visit: Attending: Hematology and Oncology

## 2024-03-31 VITALS — Wt 215.1 lb

## 2024-03-31 DIAGNOSIS — C50412 Malignant neoplasm of upper-outer quadrant of left female breast: Secondary | ICD-10-CM | POA: Insufficient documentation

## 2024-03-31 DIAGNOSIS — I890 Lymphedema, not elsewhere classified: Secondary | ICD-10-CM | POA: Diagnosis not present

## 2024-03-31 DIAGNOSIS — M25612 Stiffness of left shoulder, not elsewhere classified: Secondary | ICD-10-CM | POA: Diagnosis not present

## 2024-03-31 DIAGNOSIS — R293 Abnormal posture: Secondary | ICD-10-CM | POA: Insufficient documentation

## 2024-03-31 DIAGNOSIS — Z171 Estrogen receptor negative status [ER-]: Secondary | ICD-10-CM | POA: Insufficient documentation

## 2024-03-31 DIAGNOSIS — Z483 Aftercare following surgery for neoplasm: Secondary | ICD-10-CM | POA: Insufficient documentation

## 2024-03-31 DIAGNOSIS — N644 Mastodynia: Secondary | ICD-10-CM | POA: Insufficient documentation

## 2024-03-31 DIAGNOSIS — R600 Localized edema: Secondary | ICD-10-CM | POA: Insufficient documentation

## 2024-03-31 NOTE — Therapy (Signed)
 OUTPATIENT PHYSICAL THERAPY  UPPER EXTREMITY ONCOLOGY  Patient Name: [name] [name] MRN: [id number] DOB:22-Jun-1951, 73 y.o., female Today's Date: 03/31/2024  END OF SESSION:  PT End of Session - 03/31/24 1109     Visit Number 12    Number of Visits 18    Date for PT Re-Evaluation 04/14/24    PT Start Time 1106    PT Stop Time 1201    PT Time Calculation (min) 55 min    Activity Tolerance Patient tolerated treatment well    Behavior During Therapy WFL for tasks assessed/performed              Past Medical History:  Diagnosis Date   Adenomatous colon polyp    Allergy    Anxiety    Arthritis    bilateral hands. lower back   Cancer [facility])    skin   Cataract    bilateral   COPD (chronic obstructive pulmonary disease) (HCC)    Depression    Diverticulosis    Endometriosis    GERD (gastroesophageal reflux disease)    Glaucoma    Headache    Migraines   Hiatal hernia    History of left breast cancer 05/2023   History of radiation therapy    Left breast- 09/25/23-10/17/23- Dr. Antony Blackbird   IBS (irritable bowel syndrome)    Inguinal hernia    Pneumothorax 1987   PONV (postoperative nausea and vomiting)    Past Surgical History:  Procedure Laterality Date   ABDOMINAL HYSTERECTOMY  1992   ANTERIOR CERVICAL DECOMP/DISCECTOMY FUSION N/A 09/23/2022   Procedure: Anterior Decompression Fusion,PLATE/SCREWS Cervical five-six; REMOVAL OLD PLATE;  Surgeon: Tressie Stalker, MD;  Location: [facility] OR;  Service: Neurosurgery;  Laterality: N/A;   APPENDECTOMY  1992   BACK SURGERY     cervical   BREAST BIOPSY Left 06/23/2023   [place] LT BREAST BX W LOC DEV 1ST LESION IMG BX SPEC US GUIDE 06/23/2023 [facility]   BREAST BIOPSY Left 07/28/2023   [place] LT RADIOACTIVE SEED LOC 07/28/2023 [facility]   BREAST LUMPECTOMY WITH RADIOACTIVE SEED AND SENTINEL LYMPH NODE BIOPSY Left 07/29/2023   Procedure: LEFT BREAST SEED LUMPECTOMY WITH LEFT SENTINEL LYMPH NODE MAPPING;  Surgeon:  Harriette Bouillon, MD;  Location: [facility] OR;  Service: General;  Laterality: Left;  PEC BLOCK   CATARACT EXTRACTION Bilateral    2021, 2022   COLONOSCOPY  2023   PLEURAL SCARIFICATION     ruptured disk     torn tendon     right arm   TUBAL LIGATION     Patient Active Problem List   Diagnosis Date Noted   Genetic testing 07/11/2023   Malignant neoplasm of upper-outer quadrant of left breast in female, estrogen receptor negative (HCC) 06/30/2023   Cervical spondylosis with radiculopathy 09/23/2022   Elevated low-density lipoprotein level 06/14/2022   Coronary arteriosclerosis 06/14/2022   Adverse reaction to antihyperlipidemic drug, initial encounter 06/14/2022   Diarrhea 01/27/2019   Change in bowel habits 01/05/2019   Left inguinal hernia 06/27/2014   Bloating 05/17/2013   Constipation 05/17/2013   Pneumothorax 10/17/2008   HIATAL HERNIA 10/17/2008   DIVERTICULOSIS, COLON 10/17/2008   ENDOMETRIOSIS 10/17/2008   Right lower quadrant abdominal pain 10/17/2008   DEPRESSION, HX OF 10/17/2008   History of colonic polyps 10/17/2008   PNEUMOTHORAX 10/17/2008    PCP: Dr Renne Crigler   REFERRING PROVIDER: Rachel Moulds, MD  REFERRING DIAG: Left Breast Cancer  THERAPY DIAG:  Lymphedema, not elsewhere classified  Breast pain  Malignant neoplasm of upper-outer quadrant of left breast in female, estrogen receptor negative (HCC)  Aftercare following surgery for neoplasm  Abnormal posture  Stiffness of left shoulder, not elsewhere classified  Localized edema  ONSET DATE: 1 year ago  Rationale for Evaluation and Treatment: Rehabilitation  SUBJECTIVE:                                                                                                                                                                                           SUBJECTIVE STATEMENT:    The breast pain is still about the same. I'm tired of wearing the compression sleeve and glove. Under my Lt breast just hurts  all the time. I think it's from wearing the compression bra all the time. I get re measured for hopefully better fitting compression sleeve and glove today.   PERTINENT HISTORY:  Patient was diagnosed on 08/15/2023 with left grade 3 invasive ductal carcinoma breast cancer. She underwent a left lumpectomy and sentinel node biopsy (13 negative nodes) on 07/29/2023. It is triple negative with a Ki67 of 95%. She has a history of a cervical fusion in 08/2022. She ended radiation in October 2024. She had a breast seroma aspirated of 10 cc on 01/22/2024 . She has had prior PT treatment for cording and left UE/breast swelling  PAIN:  Are you having pain? No pain until I touch it, then it jumps up to a 7-8/10 for a few seconds   Aggravating factors: laying on left side,arm rubbing side, touching it,raising arm Relieving factors: propping her arm, advil  PRECAUTIONS:  L UE lymphedema risk, cervical fusion C6, C7   RED FLAGS: Compression fracture: Yes: T12    WEIGHT BEARING RESTRICTIONS: No  FALLS:  Has patient fallen in last 6 months? No  LIVING ENVIRONMENT: Lives with: lives with their spouse Lives in: House/apartment Stairs: Yes; External: 5 steps; can reach both Has following equipment at home: None  OCCUPATION: locksmith  LEISURE: play cards,   HAND DOMINANCE: right   PRIOR LEVEL OF FUNCTION: Independent  PATIENT GOALS: Get rid of pain and swelling   OBJECTIVE: Note: Objective measures were completed at Evaluation unless otherwise noted.  COGNITION: Overall cognitive status: Within functional limits for tasks assessed   PALPATION: Tender lateral breast, inferior breast,axilla  OBSERVATIONS / OTHER ASSESSMENTS: generalized swelling,mild redness left breast. Nipple red and irritated looking, significant indentation on left from compression bra  SENSATION: Light touch: Deficits      POSTURE: forward head, rounded shoulders  UPPER EXTREMITY AROM/PROM:  A/PROM RIGHT    eval   Shoulder extension   Shoulder flexion 155  Shoulder abduction 165  Shoulder internal rotation   Shoulder external rotation     (Blank rows = not tested)  A/PROM LEFT   eval  Shoulder extension   Shoulder flexion 153  Shoulder abduction 162  Shoulder internal rotation   Shoulder external rotation     (Blank rows = not tested)  CERVICAL AROM: All within functional limits:     UPPER EXTREMITY STRENGTH:   LYMPHEDEMA ASSESSMENTS:   SURGERY TYPE/DATE: 07/29/23 L lumpectomy and SLNB   NUMBER OF LYMPH NODES REMOVED: 0+/13  CHEMOTHERAPY: no pt declined  RADIATION:YES, completed Oct 2024  HORMONE TREATMENT: None  INFECTIONS: none   LYMPHEDEMA ASSESSMENTS:   LANDMARK RIGHT  eval  At axilla  33.4  15 cm proximal to olecranon process 31.3  10 cm proximal to olecranon process 29.5  Olecranon process 26.4  15 cm proximal to ulnar styloid process 24.2  10 cm proximal to ulnar styloid process 21.0  Just proximal to ulnar styloid process 16.8  Across hand at thumb web space 19.8  At base of 2nd digit 6.4  (Blank rows = not tested)  LANDMARK LEFT  eval LEFT 02/25/2024 LEFT 03/08/2024 LEFT 3/19/20256.3  At axilla  33.2 33.9 32.5 32.5  15 cm proximal to olecranon process 32.2 31.3  31.3  10 cm proximal to olecranon process 30.5 30.6 30.5 30.3  Olecranon process 27.9 27.6 27.4 27.6  15 cm proximal to ulnar styloid process 24.5 24.1 24.3 24.0  10 cm proximal to ulnar styloid process 21.5 21 21.0 20.9  Just proximal to ulnar styloid process 18.65 17.5 17.7 18.3  Across hand at thumb web space 21.0 20.5  21.0  At base of 2nd digit 6.35 6.3 6.3 6.3  (Blank rows = not tested)   FUNCTIONAL TESTS:    GAIT:WNL   L-DEX LYMPHEDEMA SCREENING: The patient was assessed using the L-Dex machine today to produce a lymphedema index baseline score. The patient will be reassessed on a regular basis (typically every 3 months) to obtain new L-Dex scores. If the score is >  6.5 points away from his/her baseline score indicating onset of subclinical lymphedema, it will be recommended to wear a compression garment for 4 weeks, 12 hours per day and then be reassessed. If the score continues to be > 6.5 points from baseline at reassessment, we will initiate lymphedema treatment. Assessing in this manner has a 95% rate of preventing clinically significant lymphedema.  QUICK DASH SURVEY: 48%    BREAST COMPLAINTS QUESTIONNAIRE Pain:8 Heaviness:10 Swollen feeling:10 Tense Skin:8 Redness:3 Bra Print:8 Size of Pores:5 Hard feeling: 8 Total:    60 /80 A Score over 9 indicates lymphedema issues in the breast  L-DEX FLOWSHEETS - 03/31/24 1100       L-DEX LYMPHEDEMA SCREENING   Measurement Type Unilateral    L-DEX MEASUREMENT EXTREMITY Upper Extremity    POSITION  Standing    DOMINANT SIDE Right    At Risk Side Left    BASELINE SCORE (UNILATERAL) 4.5    L-DEX SCORE (UNILATERAL) 9.9    VALUE CHANGE (UNILAT) 5.4               The patient was assessed using the L-Dex machine today to produce a lymphedema index baseline score. The patient will be reassessed on a regular basis (typically every 3 months) to obtain new L-Dex scores. If the score is > 6.5 points away from his/her baseline score indicating onset of subclinical lymphedema, it will be recommended to wear a compression garment for 4 weeks,  12 hours per day and then be reassessed. If the score continues to be > 6.5 points from baseline at reassessment, we will initiate lymphedema treatment. Assessing in this manner has a 95% rate of preventing clinically significant lymphedema.                                                                                                              TREATMENT DATE:  Pt permission and consent throughout each step of examination and treatment with modification and draping if requested when working on sensitive areas 03/31/24: Re did SOZO at beginning of session and pt is  back WNLs at a change of 5.4 so instructed her to continue wearing her compression sleeve but start to wean off by wearing it for about half the day for about a week since she was elevated for so long. And also to wear compression sleeve and glove when with highly repetitive activities like working out.  Manual Therapy MFR to Left axilla and medial upper arm to release cord; and to left breast all done gently but this was some improved today, still worse at lateral aspects PROM left shoulder flex, scaption, abd and D2 with scapular depression throughout by therapist In supine: Short neck, superficial and deep abdominals, Rt axillary and pectoral nodes, anterior intact thorax sequence, and establishment of anterior inter-axillary pathway, Lt inguinal nodes and establishment of Lt axillo-inguinal pathway, then Lt breast moving fluid towards pathways, also done in Rt S/L to focus on lateral breast, then finished retracing all steps in supine and spending extra time in any areas of fibrosis then retracing pathways and ending with LN's.  03/29/24: Manual Therapy MFR to Left axilla and medial upper arm to release cord; and to left breast all done very gently and as pt was able to tolerate due to increased hypersensitivity, more so at lateral aspects PROM left shoulder flex, scaption, abd and D2 with scapular depression throughout by therapist In supine: Short neck, superficial and deep abdominals, Rt axillary and pectoral nodes, anterior intact thorax sequence, and establishment of anterior inter-axillary pathway, Lt inguinal nodes and establishment of Lt axillo-inguinal pathway, then Lt breast moving fluid towards pathways, then left UE outer arm, medial to lateral, outer arm again spending extra time in any areas of fibrosis then retracing pathways and ending with LN's.  03/24/2026 MFR to Left axilla and medial upper arm to release cord; and to left breast briefly PROM left shoulder flex, scaption, abd In  supine: Short neck, 5 diaphragmatic breaths, R axillary nodes and establishment of interaxillary pathway, L inguinal nodes and establishment of axilloinguinal pathway, then L breast moving fluid towards pathways, then left UE outer arm, medial to lateral, outer arm again spending extra time in any areas of fibrosis then retracing pathways and  ending with LN's. Showed single and double arm pectoral stretch in doorway to stretch left pectorals    L-DEX FLOWSHEETS - 03/31/24 1100       L-DEX LYMPHEDEMA SCREENING   Measurement Type Unilateral    L-DEX MEASUREMENT EXTREMITY  Upper Extremity    POSITION  Standing    DOMINANT SIDE Right    At Risk Side Left    BASELINE SCORE (UNILATERAL) 4.5    L-DEX SCORE (UNILATERAL) 9.9    VALUE CHANGE (UNILAT) 5.4              The patient was assessed using the L-Dex machine today to produce a lymphedema index baseline score. The patient will be reassessed on a regular basis (typically every 3 months) to obtain new L-Dex scores. If the score is > 6.5 points away from his/her baseline score indicating onset of subclinical lymphedema, it will be recommended to wear a compression garment for 4 weeks, 12 hours per day and then be reassessed. If the score continues to be > 6.5 points from baseline at reassessment, we will initiate lymphedema treatment. Assessing in this manner has a 95% rate of preventing clinically significant lymphedema.        PATIENT EDUCATION:  Education details:POC, LOS, treatment interventions. Had Blaire also check redness at breast;agrees no infection. Gave script for pt to look into getting a different compression bra at [facility]. Dorothyann Gibbs also spoke to her about a Teacher, early years/pre. Person educated: Patient Education method: Explanation Education comprehension: verbalized understanding  HOME EXERCISE PROGRAM:   ASSESSMENT:  CLINICAL IMPRESSION: Continued with Lt breast MLD and MFR for cording in axilla and breast. Pts  hypersensitivity was some improved today, more so at superior breast/pect insertion. She was seeing Rodman Pickle after session today to get re measured for hopefully more comfortable fitting compression sleeve and glove. Her SOZO has returned back to [facility]. Pt was very happy about this. See above for instructions issued about wear of compression now.  04/01/2024: Addendum: Pt was fit for Class 2 (20-30) Left [employer]i [employer] RTW compression garments in [employer]. Sleeve size 3 long with silicone border, and glove size 3. Pt requires compression garments for continued treatment of Left UE lymphedema as pt is s/p left breast lumpectomy with removal of 13 LN's as noted in her history..  OBJECTIVE IMPAIRMENTS: decreased activity tolerance, decreased knowledge of condition, increased edema, impaired UE functional use, postural dysfunction, and pain.   ACTIVITY LIMITATIONS: sleeping, bed mobility, and reach over head  PARTICIPATION LIMITATIONS:  anything requiring use of her arm to reach in certain directions putting pressure on breast  PERSONAL FACTORS: 1-2 comorbidities: Left triple negative breast cancer s/p radiation  are also affecting patient's functional outcome.   REHAB POTENTIAL: Good  CLINICAL DECISION MAKING: Evolving/moderate complexity  EVALUATION COMPLEXITY: Moderate  GOALS: Goals reviewed with patient? Yes  SHORT TERM GOALS Target date: 02/25/2024   1.  Pt will have decreased Left breast pain by 25%% Baseline:  Goal status: MET 03/17/2024 2.  Pt will have decreased breast complaints questionnaire score to 40/80 Baseline:  Goal status: In progress 3.  Pt will be compliant with compression bra/vest to decrease swelling Baseline:  Goal status:MET 03/17/2024 4.  Pt will have new compression garments for arm/hand prn Baseline:  Goal status: In progress LONG TERM GOALS: Target date: 03/17/2024  Pt will have decreased breast pain by 50% for improved activity tolerance Baseline:  Goal  status: MET 03/17/2024 2.  Pt will be independent with MLD to left breast and UE prn to decrease swelling Baseline:  Goal status: In Progress  3.  Breast complaints questionnaire will be decreased to no greater than 25 to demonstrate improved swelling Baseline:  Goal status: In Progress 4.  Pt will be able  to sleep on her left side without left breast pain Baseline:  Goal status: In Progress  5.  Pt will be tolerant of UE compression garments and will note decreased edema throughout Baseline:  Goal status: partially met; sleeve is more tolerable but glove is not   PLAN:  PT FREQUENCY: 2x/week  PT DURATION: 6 weeks  PLANNED INTERVENTIONS: 97164- PT Re-evaluation, 97110-Therapeutic exercises, 97530- Therapeutic activity, 97112- Neuromuscular re-education, 97535- Self Care, 16109- Manual therapy, and 97760- Orthotic Fit/training  PLAN FOR NEXT SESSION: do not do IASTM, MFR to breast cord and axillary cording, breast MLD, UE MLD, continue to modify garments prn; (Sent demographics and note to [employer]; (03/17/2024)  [name] [name], PTA 03/31/2024, 1:32 PM [name] [name], PT 04/01/2024 addendum

## 2024-04-05 ENCOUNTER — Ambulatory Visit

## 2024-04-05 DIAGNOSIS — R293 Abnormal posture: Secondary | ICD-10-CM

## 2024-04-05 DIAGNOSIS — M79643 Pain in unspecified hand: Secondary | ICD-10-CM | POA: Diagnosis not present

## 2024-04-05 DIAGNOSIS — M25612 Stiffness of left shoulder, not elsewhere classified: Secondary | ICD-10-CM

## 2024-04-05 DIAGNOSIS — N644 Mastodynia: Secondary | ICD-10-CM

## 2024-04-05 DIAGNOSIS — Z171 Estrogen receptor negative status [ER-]: Secondary | ICD-10-CM

## 2024-04-05 DIAGNOSIS — R600 Localized edema: Secondary | ICD-10-CM

## 2024-04-05 DIAGNOSIS — M79645 Pain in left finger(s): Secondary | ICD-10-CM | POA: Diagnosis not present

## 2024-04-05 DIAGNOSIS — Z483 Aftercare following surgery for neoplasm: Secondary | ICD-10-CM | POA: Diagnosis not present

## 2024-04-05 DIAGNOSIS — C50412 Malignant neoplasm of upper-outer quadrant of left female breast: Secondary | ICD-10-CM | POA: Diagnosis not present

## 2024-04-05 DIAGNOSIS — I890 Lymphedema, not elsewhere classified: Secondary | ICD-10-CM

## 2024-04-05 DIAGNOSIS — M79644 Pain in right finger(s): Secondary | ICD-10-CM | POA: Diagnosis not present

## 2024-04-05 DIAGNOSIS — M1990 Unspecified osteoarthritis, unspecified site: Secondary | ICD-10-CM | POA: Diagnosis not present

## 2024-04-05 NOTE — Therapy (Signed)
 OUTPATIENT PHYSICAL THERAPY  UPPER EXTREMITY ONCOLOGY  Patient Name: [name] [name] MRN: [id number] DOB:Jul 30, 1951, 73 y.o., female Today's Date: 04/05/2024  END OF SESSION:  PT End of Session - 04/05/24 1206     Visit Number 13    Number of Visits 18    Date for PT Re-Evaluation 04/14/24    PT Start Time 1208    PT Stop Time 1306    PT Time Calculation (min) 58 min    Activity Tolerance Patient tolerated treatment well    Behavior During Therapy [facility] for tasks assessed/performed              Past Medical History:  Diagnosis Date   Adenomatous colon polyp    Allergy    Anxiety    Arthritis    bilateral hands. lower back   Cancer [facility])    skin   Cataract    bilateral   COPD (chronic obstructive pulmonary disease) (HCC)    Depression    Diverticulosis    Endometriosis    GERD (gastroesophageal reflux disease)    Glaucoma    Headache    Migraines   Hiatal hernia    History of left breast cancer 05/2023   History of radiation therapy    Left breast- 09/25/23-10/17/23- Dr. Antony Blackbird   IBS (irritable bowel syndrome)    Inguinal hernia    Pneumothorax 1987   PONV (postoperative nausea and vomiting)    Past Surgical History:  Procedure Laterality Date   ABDOMINAL HYSTERECTOMY  1992   ANTERIOR CERVICAL DECOMP/DISCECTOMY FUSION N/A 09/23/2022   Procedure: Anterior Decompression Fusion,PLATE/SCREWS Cervical five-six; REMOVAL OLD PLATE;  Surgeon: Tressie Stalker, MD;  Location: [facility] OR;  Service: Neurosurgery;  Laterality: N/A;   APPENDECTOMY  1992   BACK SURGERY     cervical   BREAST BIOPSY Left 06/23/2023   [place] LT BREAST BX W LOC DEV 1ST LESION IMG BX SPEC US GUIDE 06/23/2023 [facility]MOGRAP[facility]   BREAST BIOPSY Left 07/28/2023   [place] LT RADIOACTIVE SEED LOC 07/28/2023 [facility] MAMMOGRAP[facility]   BREAST LUMPECTOMY WITH RADIOACTIVE SEED AND SENTINEL LYMPH NODE BIOPSY Left 07/29/2023   Procedure: LEFT BREAST SEED LUMPECTOMY WITH LEFT SENTINEL LYMPH NODE MAPPING;  Surgeon:  Harriette Bouillon, MD;  Location: [facility] OR;  Service: General;  Laterality: Left;  PEC BLOCK   CATARACT EXTRACTION Bilateral    2021, 2022   COLONOSCOPY  2023   PLEURAL SCARIFICATION     ruptured disk     torn tendon     right arm   TUBAL LIGATION     Patient Active Problem List   Diagnosis Date Noted   Genetic testing 07/11/2023   Malignant neoplasm of upper-outer quadrant of left breast in female, estrogen receptor negative (HCC) 06/30/2023   Cervical spondylosis with radiculopathy 09/23/2022   Elevated low-density lipoprotein level 06/14/2022   Coronary arteriosclerosis 06/14/2022   Adverse reaction to antihyperlipidemic drug, initial encounter 06/14/2022   Diarrhea 01/27/2019   Change in bowel habits 01/05/2019   Left inguinal hernia 06/27/2014   Bloating 05/17/2013   Constipation 05/17/2013   Pneumothorax 10/17/2008   HIATAL HERNIA 10/17/2008   DIVERTICULOSIS, COLON 10/17/2008   ENDOMETRIOSIS 10/17/2008   Right lower quadrant abdominal pain 10/17/2008   DEPRESSION, HX OF 10/17/2008   History of colonic polyps 10/17/2008   PNEUMOTHORAX 10/17/2008    PCP: Dr Renne Crigler   REFERRING PROVIDER: Rachel Moulds, MD  REFERRING DIAG: Left Breast Cancer  THERAPY DIAG:  Lymphedema, not elsewhere classified  Breast pain  Malignant neoplasm of upper-outer quadrant of left breast in female, estrogen receptor negative (HCC)  Aftercare following surgery for neoplasm  Abnormal posture  Stiffness of left shoulder, not elsewhere classified  Localized edema  ONSET DATE: 1 year ago  Rationale for Evaluation and Treatment: Rehabilitation  SUBJECTIVE:                                                                                                                                                                                           SUBJECTIVE STATEMENT:    I went without my sleeve one day while working in the flower bed and my arm is more swollen. The breast   PERTINENT HISTORY:   Patient was diagnosed on 08/15/2023 with left [name] 3 invasive ductal carcinoma breast cancer. She underwent a left lumpectomy and sentinel node biopsy (13 negative nodes) on 07/29/2023. It is triple negative with a Ki67 of 95%. She has a history of a cervical fusion in 08/2022. She ended radiation in October 2024. She had a breast seroma aspirated of 10 cc on 01/22/2024 . She has had prior PT treatment for cording and left UE/breast swelling  PAIN:  Are you having pain? No pain until I touch it, then it jumps up to a 7-8/10 for a few seconds   Aggravating factors: laying on left side,arm rubbing side, touching it,raising arm Relieving factors: propping her arm, advil  PRECAUTIONS:  L UE lymphedema risk, cervical fusion C6, C7   RED FLAGS: Compression fracture: Yes: T12    WEIGHT BEARING RESTRICTIONS: No  FALLS:  Has patient fallen in last 6 months? No  LIVING ENVIRONMENT: Lives with: lives with their spouse Lives in: House/apartment Stairs: Yes; External: 5 steps; can reach both Has following equipment at home: None  OCCUPATION: locksmith  LEISURE: play cards,   HAND DOMINANCE: right   PRIOR LEVEL OF FUNCTION: Independent  PATIENT GOALS: Get rid of pain and swelling   OBJECTIVE: Note: Objective measures were completed at Evaluation unless otherwise noted.  COGNITION: Overall cognitive status: Within functional limits for tasks assessed   PALPATION: Tender lateral breast, inferior breast,axilla  OBSERVATIONS / OTHER ASSESSMENTS: generalized swelling,mild redness left breast. Nipple red and irritated looking, significant indentation on left from compression bra  SENSATION: Light touch: Deficits      POSTURE: forward head, rounded shoulders  UPPER EXTREMITY AROM/PROM:  A/PROM RIGHT   eval   Shoulder extension   Shoulder flexion 155  Shoulder abduction 165  Shoulder internal rotation   Shoulder external rotation     (Blank rows = not tested)  A/PROM LEFT    eval  Shoulder extension  Shoulder flexion 153  Shoulder abduction 162  Shoulder internal rotation   Shoulder external rotation     (Blank rows = not tested)  CERVICAL AROM: All within functional limits:     UPPER EXTREMITY STRENGTH:   LYMPHEDEMA ASSESSMENTS:   SURGERY TYPE/DATE: 07/29/23 L lumpectomy and SLNB   NUMBER OF LYMPH NODES REMOVED: 0+/13  CHEMOTHERAPY: no pt declined  RADIATION:YES, completed Oct 2024  HORMONE TREATMENT: None  INFECTIONS: none   LYMPHEDEMA ASSESSMENTS:   LANDMARK RIGHT  eval  At axilla  33.4  15 cm proximal to olecranon process 31.3  10 cm proximal to olecranon process 29.5  Olecranon process 26.4  15 cm proximal to ulnar styloid process 24.2  10 cm proximal to ulnar styloid process 21.0  Just proximal to ulnar styloid process 16.8  Across hand at thumb web space 19.8  At base of 2nd digit 6.4  (Blank rows = not tested)  LANDMARK LEFT  eval LEFT 02/25/2024 LEFT 03/08/2024 LEFT 3/19/20256.3  At axilla  33.2 33.9 32.5 32.5  15 cm proximal to olecranon process 32.2 31.3  31.3  10 cm proximal to olecranon process 30.5 30.6 30.5 30.3  Olecranon process 27.9 27.6 27.4 27.6  15 cm proximal to ulnar styloid process 24.5 24.1 24.3 24.0  10 cm proximal to ulnar styloid process 21.5 21 21.0 20.9  Just proximal to ulnar styloid process 18.65 17.5 17.7 18.3  Across hand at thumb web space 21.0 20.5  21.0  At base of 2nd digit 6.35 6.3 6.3 6.3  (Blank rows = not tested)   FUNCTIONAL TESTS:    GAIT:WNL   L-DEX LYMPHEDEMA SCREENING: The patient was assessed using the L-Dex machine today to produce a lymphedema index baseline score. The patient will be reassessed on a regular basis (typically every 3 months) to obtain new L-Dex scores. If the score is > 6.5 points away from his/her baseline score indicating onset of subclinical lymphedema, it will be recommended to wear a compression garment for 4 weeks, 12 hours per day and then be  reassessed. If the score continues to be > 6.5 points from baseline at reassessment, we will initiate lymphedema treatment. Assessing in this manner has a 95% rate of preventing clinically significant lymphedema.  QUICK DASH SURVEY: 48%    BREAST COMPLAINTS QUESTIONNAIRE Pain:8 Heaviness:10 Swollen feeling:10 Tense Skin:8 Redness:3 Bra Print:8 Size of Pores:5 Hard feeling: 8 Total:    60 /80 A Score over 9 indicates lymphedema issues in the breast      The patient was assessed using the L-Dex machine today to produce a lymphedema index baseline score. The patient will be reassessed on a regular basis (typically every 3 months) to obtain new L-Dex scores. If the score is > 6.5 points away from his/her baseline score indicating onset of subclinical lymphedema, it will be recommended to wear a compression garment for 4 weeks, 12 hours per day and then be reassessed. If the score continues to be > 6.5 points from baseline at reassessment, we will initiate lymphedema treatment. Assessing in this manner has a 95% rate of preventing clinically significant lymphedema.  TREATMENT DATE:  Pt permission and consent throughout each step of examination and treatment with modification and draping if requested when working on sensitive areas  04/05/2024 MF to left axillary, breast and upper arm cording with longitudinal stretch, elbow ext ROM to stretch cord as well with pt feeling it in the lateral breast In supine: Short neck, 5 breaths ( no superficial and deep abd due to IBS today), Rt axillary node, and establishment of anterior inter-axillary pathway, Lt inguinal nodes and establishment of Lt axillo-inguinal pathway, t then left UE outer arm, medial to lateral, outer arm again spending extra time in any areas of fibrosis then retracing pathways, then both sides of forearm retracing steps and  hand retracing all steps and ending with LN's. Showed pt high reach on wall with wrist extension to stretch cords, and reviewed Wall stretch in abd which pt was doing incorrectly. Showed her how to step closer to get more stretch. Advised she try gentle stretches 3 times per day for cording Discussed contacting MD to see if mammogram could be sooner.(Now scheduled for early June) 03/31/24: Re did SOZO at beginning of session and pt is back WNLs at a change of 5.4 so instructed her to continue wearing her compression sleeve but start to wean off by wearing it for about half the day for about a week since she was elevated for so long. And also to wear compression sleeve and glove when with highly repetitive activities like working out.  Manual Therapy MFR to Left axilla and medial upper arm to release cord; and to left breast all done gently but this was some improved today, still worse at lateral aspects PROM left shoulder flex, scaption, abd and D2 with scapular depression throughout by therapist In supine: Short neck, superficial and deep abdominals, Rt axillary and pectoral nodes, anterior intact thorax sequence, and establishment of anterior inter-axillary pathway, Lt inguinal nodes and establishment of Lt axillo-inguinal pathway, then Lt breast moving fluid towards pathways, also done in Rt S/L to focus on lateral breast, then finished retracing all steps in supine and spending extra time in any areas of fibrosis then retracing pathways and ending with LN's.  03/29/24: Manual Therapy MFR to Left axilla and medial upper arm to release cord; and to left breast all done very gently and as pt was able to tolerate due to increased hypersensitivity, more so at lateral aspects PROM left shoulder flex, scaption, abd and D2 with scapular depression throughout by therapist In supine: Short neck, superficial and deep abdominals, Rt axillary and pectoral nodes, anterior intact thorax sequence, and establishment  of anterior inter-axillary pathway, Lt inguinal nodes and establishment of Lt axillo-inguinal pathway, then Lt breast moving fluid towards pathways, then left UE outer arm, medial to lateral, outer arm again spending extra time in any areas of fibrosis then retracing pathways and ending with LN's.  03/24/2026 MFR to Left axilla and medial upper arm to release cord; and to left breast briefly PROM left shoulder flex, scaption, abd In supine: Short neck, 5 diaphragmatic breaths, R axillary nodes and establishment of interaxillary pathway, L inguinal nodes and establishment of axilloinguinal pathway, then L breast moving fluid towards pathways, then left UE outer arm, medial to lateral, outer arm again spending extra time in any areas of fibrosis then retracing pathways and  ending with LN's. Showed single and double arm pectoral stretch in doorway to stretch left pectorals       The patient was assessed using the L-Dex machine today  to produce a lymphedema index baseline score. The patient will be reassessed on a regular basis (typically every 3 months) to obtain new L-Dex scores. If the score is > 6.5 points away from his/her baseline score indicating onset of subclinical lymphedema, it will be recommended to wear a compression garment for 4 weeks, 12 hours per day and then be reassessed. If the score continues to be > 6.5 points from baseline at reassessment, we will initiate lymphedema treatment. Assessing in this manner has a 95% rate of preventing clinically significant lymphedema.        PATIENT EDUCATION:  Education details:POC, LOS, treatment interventions. Had Blaire also check redness at breast;agrees no infection. Gave script for pt to look into getting a different compression bra at [facility]. Dorothyann Gibbs also spoke to her about a Teacher, early years/pre. Person educated: Patient Education method: Explanation Education comprehension: verbalized understanding  HOME EXERCISE  PROGRAM:   ASSESSMENT:  CLINICAL IMPRESSION: Pt continues to have pain at the lateral breast and noted increased swelling in her arm after removing sleeve yesterday for some gardening. Cording prominent in axilla, and a cord running along axillary border of pec very prominent. With arm at approx 120 pt had difficulty extending her elbow and when done gently she felt stretch into the breast. She has not yet heard from [facility] or [facility].  OBJECTIVE IMPAIRMENTS: decreased activity tolerance, decreased knowledge of condition, increased edema, impaired UE functional use, postural dysfunction, and pain.   ACTIVITY LIMITATIONS: sleeping, bed mobility, and reach over head  PARTICIPATION LIMITATIONS:  anything requiring use of her arm to reach in certain directions putting pressure on breast  PERSONAL FACTORS: 1-2 comorbidities: Left triple negative breast cancer s/p radiation  are also affecting patient's functional outcome.   REHAB POTENTIAL: Good  CLINICAL DECISION MAKING: Evolving/moderate complexity  EVALUATION COMPLEXITY: Moderate  GOALS: Goals reviewed with patient? Yes  SHORT TERM GOALS Target date: 02/25/2024   1.  Pt will have decreased Left breast pain by 25%% Baseline:  Goal status: MET 03/17/2024 2.  Pt will have decreased breast complaints questionnaire score to 40/80 Baseline:  Goal status: In progress 3.  Pt will be compliant with compression bra/vest to decrease swelling Baseline:  Goal status:MET 03/17/2024 4.  Pt will have new compression garments for arm/hand prn Baseline:  Goal status: In progress LONG TERM GOALS: Target date: 03/17/2024  Pt will have decreased breast pain by 50% for improved activity tolerance Baseline:  Goal status: MET 03/17/2024 2.  Pt will be independent with MLD to left breast and UE prn to decrease swelling Baseline:  Goal status: In Progress  3.  Breast complaints questionnaire will be decreased to no greater than 25 to  demonstrate improved swelling Baseline:  Goal status: In Progress 4.  Pt will be able to sleep on her left side without left breast pain Baseline:  Goal status: In Progress  5.  Pt will be tolerant of UE compression garments and will note decreased edema throughout Baseline:  Goal status: partially met; sleeve is more tolerable but glove is not   PLAN:  PT FREQUENCY: 2x/week  PT DURATION: 6 weeks  PLANNED INTERVENTIONS: 97164- PT Re-evaluation, 97110-Therapeutic exercises, 97530- Therapeutic activity, 97112- Neuromuscular re-education, 97535- Self Care, 66440- Manual therapy, and 97760- Orthotic Fit/training  PLAN FOR NEXT SESSION: do not do IASTM, MFR to breast cord and axillary cording, breast MLD, UE MLD, continue to modify garments prn; (Sent demographics and note to [employer]; (03/17/2024) (emailed Leah 04/05/24 again to  contact pt.)  Waynette Buttery, PT 04/05/2024, 1:08 PM Alvira Monday, PT 04/01/2024 addendum

## 2024-04-06 ENCOUNTER — Telehealth: Payer: Self-pay | Admitting: Hematology and Oncology

## 2024-04-06 ENCOUNTER — Ambulatory Visit (HOSPITAL_COMMUNITY)
Admission: RE | Admit: 2024-04-06 | Discharge: 2024-04-06 | Disposition: A | Discharge status: Home / Self Care | Source: Ambulatory Visit | Attending: Hematology and Oncology | Admitting: Hematology and Oncology

## 2024-04-06 DIAGNOSIS — Z171 Estrogen receptor negative status [ER-]: Secondary | ICD-10-CM | POA: Insufficient documentation

## 2024-04-06 DIAGNOSIS — R918 Other nonspecific abnormal finding of lung field: Secondary | ICD-10-CM | POA: Diagnosis not present

## 2024-04-06 DIAGNOSIS — J929 Pleural plaque without asbestos: Secondary | ICD-10-CM | POA: Diagnosis not present

## 2024-04-06 DIAGNOSIS — C50412 Malignant neoplasm of upper-outer quadrant of left female breast: Secondary | ICD-10-CM | POA: Diagnosis not present

## 2024-04-06 DIAGNOSIS — K449 Diaphragmatic hernia without obstruction or gangrene: Secondary | ICD-10-CM | POA: Diagnosis not present

## 2024-04-06 MED ORDER — IOHEXOL 300 MG/ML  SOLN
75.0000 mL | Freq: Once | INTRAMUSCULAR | Status: AC | PRN
  Administered 2024-04-06: 75 mL via INTRAVENOUS

## 2024-04-06 MED ORDER — SODIUM CHLORIDE (PF) 0.9 % IJ SOLN
INTRAMUSCULAR | Status: AC
  Filled 2024-04-06: qty 50

## 2024-04-06 NOTE — Telephone Encounter (Signed)
 Spoke with pt about rescheduled appt time and date

## 2024-04-07 ENCOUNTER — Ambulatory Visit

## 2024-04-12 ENCOUNTER — Ambulatory Visit

## 2024-04-12 DIAGNOSIS — Z483 Aftercare following surgery for neoplasm: Secondary | ICD-10-CM

## 2024-04-12 DIAGNOSIS — M25612 Stiffness of left shoulder, not elsewhere classified: Secondary | ICD-10-CM | POA: Diagnosis not present

## 2024-04-12 DIAGNOSIS — R293 Abnormal posture: Secondary | ICD-10-CM

## 2024-04-12 DIAGNOSIS — N644 Mastodynia: Secondary | ICD-10-CM | POA: Diagnosis not present

## 2024-04-12 DIAGNOSIS — C50412 Malignant neoplasm of upper-outer quadrant of left female breast: Secondary | ICD-10-CM | POA: Diagnosis not present

## 2024-04-12 DIAGNOSIS — I890 Lymphedema, not elsewhere classified: Secondary | ICD-10-CM | POA: Diagnosis not present

## 2024-04-12 DIAGNOSIS — Z171 Estrogen receptor negative status [ER-]: Secondary | ICD-10-CM | POA: Diagnosis not present

## 2024-04-12 DIAGNOSIS — R600 Localized edema: Secondary | ICD-10-CM | POA: Diagnosis not present

## 2024-04-12 NOTE — Therapy (Addendum)
 OUTPATIENT PHYSICAL THERAPY  UPPER EXTREMITY ONCOLOGY  Patient Name: [name] [name] MRN: [id number] DOB:02-17-1951, 73 y.o., female Today's Date: 04/12/2024  END OF SESSION:  PT End of Session - 04/12/24 1211     Visit Number 14    Number of Visits 26    Date for PT Re-Evaluation 05/24/24    PT Start Time 1206    PT Stop Time 1306    PT Time Calculation (min) 60 min    Activity Tolerance Patient tolerated treatment well    Behavior During Therapy [facility] for tasks assessed/performed              Past Medical History:  Diagnosis Date   Adenomatous colon polyp    Allergy    Anxiety    Arthritis    bilateral hands. lower back   Cancer [facility])    skin   Cataract    bilateral   COPD (chronic obstructive pulmonary disease) (HCC)    Depression    Diverticulosis    Endometriosis    GERD (gastroesophageal reflux disease)    Glaucoma    Headache    Migraines   Hiatal hernia    History of left breast cancer 05/2023   History of radiation therapy    Left breast- 09/25/23-10/17/23- Dr. Retta Caster   IBS (irritable bowel syndrome)    Inguinal hernia    Pneumothorax 1987   PONV (postoperative nausea and vomiting)    Past Surgical History:  Procedure Laterality Date   ABDOMINAL HYSTERECTOMY  1992   ANTERIOR CERVICAL DECOMP/DISCECTOMY FUSION N/A 09/23/2022   Procedure: Anterior Decompression Fusion,PLATE/SCREWS Cervical five-six; REMOVAL OLD PLATE;  Surgeon: Garry Kansas, MD;  Location: [facility] OR;  Service: Neurosurgery;  Laterality: N/A;   APPENDECTOMY  1992   BACK SURGERY     cervical   BREAST BIOPSY Left 06/23/2023   US  LT BREAST BX W LOC DEV 1ST LESION IMG BX SPEC US  GUIDE 06/23/2023 [facility]   BREAST BIOPSY Left 07/28/2023   US  LT RADIOACTIVE SEED LOC 07/28/2023 [facility] MAMMOGRAP[facility]   BREAST LUMPECTOMY WITH RADIOACTIVE SEED AND SENTINEL LYMPH NODE BIOPSY Left 07/29/2023   Procedure: LEFT BREAST SEED LUMPECTOMY WITH LEFT SENTINEL LYMPH NODE MAPPING;  Surgeon:  Sim Dryer, MD;  Location: [facility] OR;  Service: General;  Laterality: Left;  PEC BLOCK   CATARACT EXTRACTION Bilateral    2021, 2022   COLONOSCOPY  2023   PLEURAL SCARIFICATION     ruptured disk     torn tendon     right arm   TUBAL LIGATION     Patient Active Problem List   Diagnosis Date Noted   Genetic testing 07/11/2023   Malignant neoplasm of upper-outer quadrant of left breast in female, estrogen receptor negative (HCC) 06/30/2023   Cervical spondylosis with radiculopathy 09/23/2022   Elevated low-density lipoprotein level 06/14/2022   Coronary arteriosclerosis 06/14/2022   Adverse reaction to antihyperlipidemic drug, initial encounter 06/14/2022   Diarrhea 01/27/2019   Change in bowel habits 01/05/2019   Left inguinal hernia 06/27/2014   Bloating 05/17/2013   Constipation 05/17/2013   Pneumothorax 10/17/2008   HIATAL HERNIA 10/17/2008   DIVERTICULOSIS, COLON 10/17/2008   ENDOMETRIOSIS 10/17/2008   Right lower quadrant abdominal pain 10/17/2008   DEPRESSION, HX OF 10/17/2008   History of colonic polyps 10/17/2008   PNEUMOTHORAX 10/17/2008    PCP: Dr Schuyler Custard   REFERRING PROVIDER: Murleen Arms, MD  REFERRING DIAG: Left Breast Cancer  THERAPY DIAG:  Lymphedema, not elsewhere classified  Breast pain  Malignant neoplasm of upper-outer quadrant of left breast in female, estrogen receptor negative (HCC)  Aftercare following surgery for neoplasm  Abnormal posture  ONSET DATE: 1 year ago  Rationale for Evaluation and Treatment: Rehabilitation  SUBJECTIVE:                                                                                                                                                                                           SUBJECTIVE STATEMENT:    I still don't feel like I've recovered from when I did yard work without my sleeve last week. I need to take a 2 week break from physical therapy because my husband and I have a lot of other appts  coming up over the next few weeks but I really want to cont physical therapy. I hope to get back to feeling as good as I did a few weeks ago before I had a set back.   PERTINENT HISTORY:  Patient was diagnosed on 08/15/2023 with left grade 3 invasive ductal carcinoma breast cancer. She underwent a left lumpectomy and sentinel node biopsy (13 negative nodes) on 07/29/2023. It is triple negative with a Ki67 of 95%. She has a history of a cervical fusion in 08/2022. She ended radiation in October 2024. She had a breast seroma aspirated of 10 cc on 01/22/2024 . She has had prior PT treatment for cording and left UE/breast swelling  PAIN:  PAIN:  Are you having pain? Yes NPRS scale: 5 /10 Pain location: axilla and lateral breast Pain orientation: Left  PAIN TYPE: sharp Pain description: constant  Aggravating factors: not wearing compression on my arm, but wearing a compression bra is hurting under my breast Relieving factors: not wearing compression bra, physical therapy gives short term relief   PRECAUTIONS:  L UE lymphedema risk, cervical fusion C6, C7   RED FLAGS: Compression fracture: Yes: T12   WEIGHT BEARING RESTRICTIONS: No  FALLS:  Has patient fallen in last 6 months? No  LIVING ENVIRONMENT: Lives with: lives with their spouse Lives in: House/apartment Stairs: Yes; External: 5 steps; can reach both Has following equipment at home: None  OCCUPATION: locksmith  LEISURE: play cards,   HAND DOMINANCE: right   PRIOR LEVEL OF FUNCTION: Independent  PATIENT GOALS: Get rid of pain and swelling   OBJECTIVE: Note: Objective measures were completed at Evaluation unless otherwise noted.  COGNITION: Overall cognitive status: Within functional limits for tasks assessed   PALPATION: Tender lateral breast, inferior breast,axilla  OBSERVATIONS / OTHER ASSESSMENTS: generalized swelling,mild redness left breast. Nipple red and irritated looking, significant indentation on left from  compression bra  SENSATION: Light touch:  Deficits      POSTURE: forward head, rounded shoulders  UPPER EXTREMITY AROM/PROM:  A/PROM RIGHT   eval   Shoulder extension   Shoulder flexion 155  Shoulder abduction 165  Shoulder internal rotation   Shoulder external rotation     (Blank rows = not tested)  A/PROM LEFT   eval Left 04/19/24  Shoulder extension    Shoulder flexion 153 162  Shoulder abduction 162 164  Shoulder internal rotation    Shoulder external rotation      (Blank rows = not tested)  CERVICAL AROM: All within functional limits:     UPPER EXTREMITY STRENGTH:   LYMPHEDEMA ASSESSMENTS:   SURGERY TYPE/DATE: 07/29/23 L lumpectomy and SLNB   NUMBER OF LYMPH NODES REMOVED: 0+/13  CHEMOTHERAPY: no pt declined  RADIATION:YES, completed Oct 2024  HORMONE TREATMENT: None  INFECTIONS: none   LYMPHEDEMA ASSESSMENTS:   LANDMARK RIGHT  eval  At axilla  33.4  15 cm proximal to olecranon process 31.3  10 cm proximal to olecranon process 29.5  Olecranon process 26.4  15 cm proximal to ulnar styloid process 24.2  10 cm proximal to ulnar styloid process 21.0  Just proximal to ulnar styloid process 16.8  Across hand at thumb web space 19.8  At base of 2nd digit 6.4  (Blank rows = not tested)  LANDMARK LEFT  eval LEFT 02/25/2024 LEFT 03/08/2024 LEFT 3/19/20256.3  At axilla  33.2 33.9 32.5 32.5  15 cm proximal to olecranon process 32.2 31.3  31.3  10 cm proximal to olecranon process 30.5 30.6 30.5 30.3  Olecranon process 27.9 27.6 27.4 27.6  15 cm proximal to ulnar styloid process 24.5 24.1 24.3 24.0  10 cm proximal to ulnar styloid process 21.5 21 21.0 20.9  Just proximal to ulnar styloid process 18.65 17.5 17.7 18.3  Across hand at thumb web space 21.0 20.5  21.0  At base of 2nd digit 6.35 6.3 6.3 6.3  (Blank rows = not tested)   FUNCTIONAL TESTS:    GAIT:WNL   L-DEX LYMPHEDEMA SCREENING: The patient was assessed using the L-Dex machine  today to produce a lymphedema index baseline score. The patient will be reassessed on a regular basis (typically every 3 months) to obtain new L-Dex scores. If the score is > 6.5 points away from his/her baseline score indicating onset of subclinical lymphedema, it will be recommended to wear a compression garment for 4 weeks, 12 hours per day and then be reassessed. If the score continues to be > 6.5 points from baseline at reassessment, we will initiate lymphedema treatment. Assessing in this manner has a 95% rate of preventing clinically significant lymphedema.  QUICK DASH SURVEY: 48%    BREAST COMPLAINTS QUESTIONNAIRE Pain:8 Heaviness:10 Swollen feeling:10 Tense Skin:8 Redness:3 Bra Print:8 Size of Pores:5 Hard feeling: 8 Total:    60 /80 A Score over 9 indicates lymphedema issues in the breast      The patient was assessed using the L-Dex machine today to produce a lymphedema index baseline score. The patient will be reassessed on a regular basis (typically every 3 months) to obtain new L-Dex scores. If the score is > 6.5 points away from his/her baseline score indicating onset of subclinical lymphedema, it will be recommended to wear a compression garment for 4 weeks, 12 hours per day and then be reassessed. If the score continues to be > 6.5 points from baseline at reassessment, we will initiate lymphedema treatment. Assessing in this manner has a 95% rate of  preventing clinically significant lymphedema.                                                                                                              TREATMENT DATE:  Pt permission and consent throughout each step of examination and treatment with modification and draping if requested when working on sensitive areas  04/12/24: Manual Therapy MFR to left axillary, breast and upper arm cording with longitudinal stretch as tolerated In supine: Short neck, 5 breaths ( no superficial and deep abd due to IBS today), Rt axillary  and pectoral nodes node, anterior intact thorax sequence, and establishment of anterior inter-axillary pathway, Lt inguinal nodes and establishment of Lt axillo-inguinal pathway, left breast and UE as follows: outer arm, medial to lateral, outer arm again spending extra time in any areas of fibrosis then retracing pathways, then both sides of forearm retracing steps and hand retracing all steps and ending with LN's. STM during MLD as pt could tolerate which was limited due to hypersensitivity over scar tissue, but this is where pt reports most discomfort, pain and pulling A/ROM of Lt shoulder assessed, see chart  04/05/2024 MF to left axillary, breast and upper arm cording with longitudinal stretch, elbow ext ROM to stretch cord as well with pt feeling it in the lateral breast In supine: Short neck, 5 breaths ( no superficial and deep abd due to IBS today), Rt axillary node, and establishment of anterior inter-axillary pathway, Lt inguinal nodes and establishment of Lt axillo-inguinal pathway, t then left UE outer arm, medial to lateral, outer arm again spending extra time in any areas of fibrosis then retracing pathways, then both sides of forearm retracing steps and hand retracing all steps and ending with LN's. Showed pt high reach on wall with wrist extension to stretch cords, and reviewed Wall stretch in abd which pt was doing incorrectly. Showed her how to step closer to get more stretch. Advised she try gentle stretches 3 times per day for cording Discussed contacting MD to see if mammogram could be sooner.(Now scheduled for early June) 03/31/24: Re did SOZO at beginning of session and pt is back WNLs at a change of 5.4 so instructed her to continue wearing her compression sleeve but start to wean off by wearing it for about half the day for about a week since she was elevated for so long. And also to wear compression sleeve and glove when with highly repetitive activities like working out.  Manual  Therapy MFR to Left axilla and medial upper arm to release cord; and to left breast all done gently but this was some improved today, still worse at lateral aspects PROM left shoulder flex, scaption, abd and D2 with scapular depression throughout by therapist In supine: Short neck, superficial and deep abdominals, Rt axillary and pectoral nodes, anterior intact thorax sequence, and establishment of anterior inter-axillary pathway, Lt inguinal nodes and establishment of Lt axillo-inguinal pathway, then Lt breast moving fluid towards pathways, also done in Rt S/L to focus on lateral breast, then finished retracing  all steps in supine and spending extra time in any areas of fibrosis then retracing pathways and ending with LN's.  03/29/24: Manual Therapy MFR to Left axilla and medial upper arm to release cord; and to left breast all done very gently and as pt was able to tolerate due to increased hypersensitivity, more so at lateral aspects PROM left shoulder flex, scaption, abd and D2 with scapular depression throughout by therapist In supine: Short neck, superficial and deep abdominals, Rt axillary and pectoral nodes, anterior intact thorax sequence, and establishment of anterior inter-axillary pathway, Lt inguinal nodes and establishment of Lt axillo-inguinal pathway, then Lt breast moving fluid towards pathways, then left UE outer arm, medial to lateral, outer arm again spending extra time in any areas of fibrosis then retracing pathways and ending with LN's.  03/24/2026 MFR to Left axilla and medial upper arm to release cord; and to left breast briefly PROM left shoulder flex, scaption, abd In supine: Short neck, 5 diaphragmatic breaths, R axillary nodes and establishment of interaxillary pathway, L inguinal nodes and establishment of axilloinguinal pathway, then L breast moving fluid towards pathways, then left UE outer arm, medial to lateral, outer arm again spending extra time in any areas of  fibrosis then retracing pathways and  ending with LN's. Showed single and double arm pectoral stretch in doorway to stretch left pectorals       The patient was assessed using the L-Dex machine today to produce a lymphedema index baseline score. The patient will be reassessed on a regular basis (typically every 3 months) to obtain new L-Dex scores. If the score is > 6.5 points away from his/her baseline score indicating onset of subclinical lymphedema, it will be recommended to wear a compression garment for 4 weeks, 12 hours per day and then be reassessed. If the score continues to be > 6.5 points from baseline at reassessment, we will initiate lymphedema treatment. Assessing in this manner has a 95% rate of preventing clinically significant lymphedema.        PATIENT EDUCATION:  Education details:POC, LOS, treatment interventions. Had Blaire also check redness at breast;agrees no infection. Gave script for pt to look into getting a different compression bra at [employer]. Jene Minors also spoke to her about a Teacher, early years/pre. Person educated: Patient Education method: Explanation Education comprehension: verbalized understanding  HOME EXERCISE PROGRAM:   ASSESSMENT:  CLINICAL IMPRESSION: Renewal done this session. Pt had set back end of March and has not had much long term improvement since then except her Lt shoulder A/ROM has improved with flex, though she does report increased pull felt from cording into breast and in medial upper arm. Pt does note short term improvement of pain and edema after each session and would like to cont physical therapy at this time in hopes of getting back to feeling as good as she was before set back. She does plan to take short break from her appts though for next 2 weeks due to her and her husband having more appts elsewhere. So will renew pt x 6 weeks but plan on only treating her for 4 of them. Will cont to progress towards unmet goals of reducing breast pain  and improving her Lt shoulder A/ROM with less pain.  Will have pt retake breast questionnaire next session for that goal assess.   OBJECTIVE IMPAIRMENTS: decreased activity tolerance, decreased knowledge of condition, increased edema, impaired UE functional use, postural dysfunction, and pain.   ACTIVITY LIMITATIONS: sleeping, bed mobility, and reach over  head  PARTICIPATION LIMITATIONS: anything requiring use of her arm to reach in certain directions putting pressure on breast  PERSONAL FACTORS: 1-2 comorbidities: Left triple negative breast cancer s/p radiation are also affecting patient's functional outcome.   REHAB POTENTIAL: Good  CLINICAL DECISION MAKING: Evolving/moderate complexity  EVALUATION COMPLEXITY: Moderate  GOALS: Goals reviewed with patient? Yes  SHORT TERM GOALS Target date: 02/25/2024   1.  Pt will have decreased Left breast pain by 25%% Baseline:  Goal status: MET 03/17/2024 2.  Pt will have decreased breast complaints questionnaire score to 40/80 Baseline:  Goal status: In progress 3.  Pt will be compliant with compression bra/vest to decrease swelling Baseline:  Goal status:MET 03/17/2024 4.  Pt will have new compression garments for arm/hand prn Baseline: New garments are on order Goal status: MET  LONG TERM GOALS: Target date: 05/24/24  Pt will have decreased breast pain by 50% for improved activity tolerance Baseline: 04/19/24 - pt reports set back since last week of March and pain is as bad as at eval though does reports short term reduction of pain after each session.  Goal status: MET 03/17/2024; now UNMET  2.  Pt will be independent with MLD to left breast and UE prn to decrease swelling Baseline:  Goal status: MET  3.  Breast complaints questionnaire will be decreased to no greater than 25 to demonstrate improved swelling Baseline:  Goal status: In Progress  4.  Pt will be able to sleep on her left side without left breast pain Baseline:   Goal status: MET  5.  Pt will be tolerant of UE compression garments and will note decreased edema throughout Baseline: 04/19/24 - with foam pt is able to wear compression sleeve and glove for 8-10 hrs/day Goal status: MET   PLAN:  PT FREQUENCY: 2x/week  PT DURATION: 6 weeks  PLANNED INTERVENTIONS: 97164- PT Re-evaluation, 97110-Therapeutic exercises, 97530- Therapeutic activity, 97112- Neuromuscular re-education, 97535- Self Care, 78295- Manual therapy, and 97760- Orthotic Fit/training  PLAN FOR NEXT SESSION: Pt discharged at her request; husband died suddenly.do not do IASTM, MFR to breast cord and axillary cording, breast MLD, UE MLD, continue to modify garments prn; (Sent demographics and note to [employer]; (03/17/2024) (emailed Leah 04/05/24 again to contact pt.) PHYSICAL THERAPY DISCHARGE SUMMARY  Visits from Start of Care: 14  Current functional level related to goals / functional outcomes: Pt has partially achieved established goals   Remaining deficits: Pt continued to have bothersome pain in left breast and axillary region that may have been related to cording She had a recent relapse after doing yardwork and has not returned to [facility]   Education / Equipment: HEP, compression bra/sleeve   Patient agrees to discharge. Patient goals were partially met. Patient is being discharged due to the patient's request. A lot going on right now and recent death of her husband.   Denyce Flank, PTA 04/12/2024, 1:30 PM Sharon December, PT 04/01/2024 addendum Sharon December, PT 05/19/24 10:12 AM

## 2024-04-14 ENCOUNTER — Encounter: Payer: Self-pay | Admitting: Pulmonary Disease

## 2024-04-15 ENCOUNTER — Encounter: Admitting: Rehabilitation

## 2024-04-22 DIAGNOSIS — M791 Myalgia, unspecified site: Secondary | ICD-10-CM | POA: Diagnosis not present

## 2024-04-22 DIAGNOSIS — M47812 Spondylosis without myelopathy or radiculopathy, cervical region: Secondary | ICD-10-CM | POA: Diagnosis not present

## 2024-04-23 NOTE — Addendum Note (Signed)
 Addended by: Judy Null R on: 04/23/2024 12:40 PM   Modules accepted: Orders

## 2024-04-26 ENCOUNTER — Ambulatory Visit (INDEPENDENT_AMBULATORY_CARE_PROVIDER_SITE_OTHER): Payer: Medicare Other

## 2024-04-26 VITALS — BP 127/80 | HR 65 | Temp 97.5°F | Resp 18 | Ht 69.5 in | Wt 212.2 lb

## 2024-04-26 DIAGNOSIS — I2510 Atherosclerotic heart disease of native coronary artery without angina pectoris: Secondary | ICD-10-CM

## 2024-04-26 DIAGNOSIS — E7800 Pure hypercholesterolemia, unspecified: Secondary | ICD-10-CM | POA: Diagnosis not present

## 2024-04-26 DIAGNOSIS — T466X5A Adverse effect of antihyperlipidemic and antiarteriosclerotic drugs, initial encounter: Secondary | ICD-10-CM

## 2024-04-26 MED ORDER — INCLISIRAN SODIUM 284 MG/1.5ML ~~LOC~~ SOSY
284.0000 mg | PREFILLED_SYRINGE | Freq: Once | SUBCUTANEOUS | Status: AC
  Administered 2024-04-26: 284 mg via SUBCUTANEOUS
  Filled 2024-04-26: qty 1.5

## 2024-04-26 NOTE — Progress Notes (Signed)
 Diagnosis: Hyperlipidemia  Provider:  Mannam, Praveen MD  Procedure: Injection  Leqvio  (inclisiran), Dose: 284 mg, Site: subcutaneous, Number of injections: 1  Injection Site(s): Right vastus lateralis  Post Care:     Discharge: Condition: Good, Destination: Home . AVS Declined  Performed by:  Tenisha Fleece, RN

## 2024-04-28 ENCOUNTER — Telehealth: Payer: Self-pay | Admitting: Pharmacy Technician

## 2024-04-28 NOTE — Telephone Encounter (Signed)
 Auth Submission: APPROVED Site of care: Site of care: [facility] Payer: [employer] MEDICARE Medication & CPT/J Code(s) submitted: Leqvio  (Inclisiran) J1306 Route of submission (phone, fax, portal):  Phone # Fax # Auth type: Buy/Bill PB Units/visits requested: 284MG  X2 DOSES Q6 MONTHS Reference number: [id number] Approval from: 04/27/24 to 04/26/25

## 2024-05-03 ENCOUNTER — Ambulatory Visit: Payer: Self-pay

## 2024-05-03 DIAGNOSIS — H01009 Unspecified blepharitis unspecified eye, unspecified eyelid: Secondary | ICD-10-CM | POA: Diagnosis not present

## 2024-05-10 ENCOUNTER — Ambulatory Visit: Payer: Medicare Other | Admitting: Hematology and Oncology

## 2024-05-17 NOTE — Assessment & Plan Note (Signed)
 This is a pleasant 73 year old postmenopausal female patient with newly diagnosed left breast T2 N0 triple negative invasive ductal carcinoma referred to breast [facility] for additional recommendations.  Given triple negative biology and size of the tumor over 2 cm, we have discussed about both neoadjuvant and adjuvant approach. She is s/p surgery, refused adj chemotherapy.

## 2024-05-17 NOTE — Progress Notes (Signed)
 [facility] CONSULT NOTE  Patient Care Team: Imelda Man, MD as PCP - General (Internal Medicine) Murleen Arms, MD as Consulting Physician (Hematology and Oncology) Sim Dryer, MD as Consulting Physician (General Surgery) Retta Caster, MD as Consulting Physician (Radiation Oncology)  CHIEF COMPLAINTS/PURPOSE OF CONSULTATION:  Newly diagnosed breast cancer  HISTORY OF PRESENTING ILLNESS:  [name] [name] 73 y.o. female is here because of recent diagnosis of left breast cancer  I reviewed her records extensively and collaborated the history with the patient.  SUMMARY OF ONCOLOGIC HISTORY: Oncology History  Malignant neoplasm of upper-outer quadrant of left breast in female, estrogen receptor negative (HCC)  06/03/2023 Mammogram   Screening mammogram bilaterally showed possible mass in the left breast warranting further evaluation.  Diagnostic mammogram confirmed highly suspicious 2.2 cm upper outer left breast mass, no abnormal appearing left axillary lymph nodes   06/23/2023 Pathology Results   Pathology results from the left breast needle core biopsy showed grade 3 invasive poorly differentiated ductal carcinoma, focal high-grade DCIS, cribriform type, negative for angiolymphatic invasion, negative for micro calcs, prognostic showed ER 0% negative, PR 0% negative, HER2 negative and Ki-67 of 95%.   06/30/2023 Initial Diagnosis   Malignant neoplasm of upper-outer quadrant of left breast in female, estrogen receptor negative (HCC)    Genetic Testing   [facility] Custom Panel+RNA was Negative. Of note, a variant of uncertain significance was detected in the MSH3 gene (c.1027+4T>C (Intronic)). Report date is 07/10/2023.  The Custom Hereditary Cancers Panel offered by [facility] includes sequencing and/or deletion duplication testing of the following 46 genes: APC, ATM, AXIN2, BAP1, BARD1, BMPR1A, BRCA1, BRCA2, BRIP1, CDH1, CDK4, CDKN2A (p14ARF and p16INK4a only), CHEK2, CTNNA1,  DICER1, EPCAM (Deletion/duplication testing only), FH, GREM1 (promoter region duplication testing only), HOXB13, KIT, MBD4, MEN1, MLH1, MSH2, MSH3, MSH6, MUTYH, NF1, NHTL1, PALB2, PDGFRA, PMS2, POLD1, POLE, PRKAR1A, PTEN, RAD51C, RAD51D, RET, SMAD4, SMARCA4. STK11, TP53, TSC1, TSC2, and VHL.    09/25/2023 - 10/17/2023 Radiation Therapy   Plan Name: Breast_L Site: Breast, Left Technique: 3D Mode: Photon Dose Per Fraction: 2.67 Gy Prescribed Dose (Delivered / Prescribed): 42.72 Gy / 42.72 Gy Prescribed Fxs (Delivered / Prescribed): 16 / 16    This is a pleasant 73 year old female patient with past medical history significant for COPD, not oxygen dependent, irritable bowel syndrome, diarrhea predominant depression, well-controlled on Zoloft, anxiety, arthritis referred to breast [facility] for new diagnosis of left breast invasive ductal carcinoma.  She declined adjuvant chemotherapy.  History of Present Illness [name] [name] is a 73 year old female with breast cancer who presents with persistent breast and armpit pain.  She experiences ongoing pain in her left breast and armpit, which has not improved over time. The pain is located in the same area where she underwent surgery and radiation therapy. It has not worsened but remains constant.  She previously engaged in physical therapy and used a supportive bra, which she found helpful at night, but has since stopped using it. She has also ceased performing the recommended exercises.  A mammogram is scheduled for June 5th. No new symptoms such as cough, shortness of breath, changes in bowel habits, or urinary issues are present.  She has not started any new medications and continues with her current regimen as previously listed.  She is coping with the recent loss of her husband and has support from friends and her husband's family, though her own family lives out of town.   Rest of the pertinent 10 point ROS  reviewed and neg.  MEDICAL HISTORY:   Past Medical History:  Diagnosis Date   Adenomatous colon polyp    Allergy    Anxiety    Arthritis    bilateral hands. lower back   Cancer [facility])    skin   Cataract    bilateral   COPD (chronic obstructive pulmonary disease) (HCC)    Depression    Diverticulosis    Endometriosis    GERD (gastroesophageal reflux disease)    Glaucoma    Headache    Migraines   Hiatal hernia    History of left breast cancer 05/2023   History of radiation therapy    Left breast- 09/25/23-10/17/23- Dr. Retta Caster   IBS (irritable bowel syndrome)    Inguinal hernia    Pneumothorax 1987   PONV (postoperative nausea and vomiting)     SURGICAL HISTORY: Past Surgical History:  Procedure Laterality Date   ABDOMINAL HYSTERECTOMY  1992   ANTERIOR CERVICAL DECOMP/DISCECTOMY FUSION N/A 09/23/2022   Procedure: Anterior Decompression Fusion,PLATE/SCREWS Cervical five-six; REMOVAL OLD PLATE;  Surgeon: Garry Kansas, MD;  Location: [facility] OR;  Service: Neurosurgery;  Laterality: N/A;   APPENDECTOMY  1992   BACK SURGERY     cervical   BREAST BIOPSY Left 06/23/2023   US  LT BREAST BX W LOC DEV 1ST LESION IMG BX SPEC US  GUIDE 06/23/2023 [facility]   BREAST BIOPSY Left 07/28/2023   US  LT RADIOACTIVE SEED LOC 07/28/2023 [facility]   BREAST LUMPECTOMY WITH RADIOACTIVE SEED AND SENTINEL LYMPH NODE BIOPSY Left 07/29/2023   Procedure: LEFT BREAST SEED LUMPECTOMY WITH LEFT SENTINEL LYMPH NODE MAPPING;  Surgeon: Sim Dryer, MD;  Location: [facility] OR;  Service: General;  Laterality: Left;  PEC BLOCK   CATARACT EXTRACTION Bilateral    2021, 2022   COLONOSCOPY  2023   PLEURAL SCARIFICATION     ruptured disk     torn tendon     right arm   TUBAL LIGATION      SOCIAL HISTORY: Social History   Socioeconomic History   Marital status: Married    Spouse name: Not on file   Number of children: 2   Years of education: Not on file   Highest education level: Not on file  Occupational History    Occupation: locksmith  Tobacco Use   Smoking status: Former    Current packs/day: 0.00    Average packs/day: 1.5 packs/day for 40.1 years (60.2 ttl pk-yrs)    Types: Cigarettes    Start date: 12/31/1971    Quit date: 02/10/2012    Years since quitting: 12.2   Smokeless tobacco: Never  Vaping Use   Vaping status: Never Used  Substance and Sexual Activity   Alcohol use: No   Drug use: No   Sexual activity: Never  Other Topics Concern   Not on file  Social History Narrative   Not on file   Social Drivers of Health   Financial Resource Strain: Not on file  Food Insecurity: No Food Insecurity (09/15/2023)   Hunger Vital Sign    Worried About Running Out of Food in the Last Year: Never true    Ran Out of Food in the Last Year: Never true  Transportation Needs: No Transportation Needs (09/15/2023)   PRAPARE - Administrator, Civil Service (Medical): No    Lack of Transportation (Non-Medical): No  Physical Activity: Not on file  Stress: Not on file  Social Connections: Unknown (05/12/2022)   Received from [facility],  [facility]   Social Network    Social Network: Not on file  Intimate Partner Violence: Not At Risk (09/15/2023)   Humiliation, Afraid, Rape, and Kick questionnaire    Fear of Current or Ex-Partner: No    Emotionally Abused: No    Physically Abused: No    Sexually Abused: No    FAMILY HISTORY: Family History  Problem Relation Age of Onset   Heart failure Mother    Crohn's disease Sister    Osteoporosis Sister    Neuropathy Sister    Colon polyps Sister    Lung cancer Sister 51       smoked   Thyroid  disease Sister    Thyroid  cancer Maternal Grandmother    Colon cancer Neg Hx    Breast cancer Neg Hx    Esophageal cancer Neg Hx    Pancreatic cancer Neg Hx    Stomach cancer Neg Hx     ALLERGIES:  is allergic to bupropion, cefaclor, elemental sulfur, sulfa antibiotics, latex, and ofloxacin.  MEDICATIONS:  Current Outpatient Medications   Medication Sig Dispense Refill   acetaminophen  (TYLENOL ) 325 MG tablet Take 325 mg by mouth every 6 (six) hours as needed.     albuterol  (VENTOLIN  HFA) 108 (90 Base) MCG/ACT inhaler Inhale 2 puffs into the lungs every 6 (six) hours as needed. 18 g 5   ALPRAZolam (XANAX) 0.25 MG tablet Take 0.25 mg by mouth 3 (three) times daily as needed for anxiety.     aspirin  EC 81 MG tablet Take 81 mg by mouth in the morning. Swallow whole.     cetirizine  (ZYRTEC ) 10 MG tablet Take 1 tablet (10 mg total) by mouth daily. 30 tablet 11   Cholecalciferol (VITAMIN D  PO) Take 5,000 Units by mouth in the morning.     denosumab  (PROLIA ) 60 MG/ML SOSY injection Inject 60 mg into the skin every 6 (six) months.     famotidine  (PEPCID ) 20 MG tablet TAKE 1 TABLET (20 MG TOTAL) BY MOUTH 2 (TWO) TIMES DAILY. AS NEEDED 180 tablet 2   fluticasone (FLONASE) 50 MCG/ACT nasal spray Place into both nostrils.     Ibuprofen (ADVIL) 200 MG CAPS Take 1 capsule by mouth as needed.     inclisiran (LEQVIO ) 284 MG/1.5ML SOSY injection Inject 284 mg into the skin every 6 (six) months. Every three months     loperamide (IMODIUM) 2 MG capsule Take 2 mg by mouth as needed for diarrhea or loose stools.     promethazine  (PHENERGAN ) 12.5 MG suppository Place 12.5 mg rectally every 6 (six) hours as needed for nausea or vomiting.     sertraline (ZOLOFT) 100 MG tablet Take 50 mg by mouth in the morning.     traZODone (DESYREL) 100 MG tablet Take 100 mg by mouth at bedtime.     XDEMVY 0.25 % SOLN Place 1 drop into both eyes in the morning and at bedtime.     No current facility-administered medications for this visit.    REVIEW OF SYSTEMS:   Constitutional: Denies fevers, chills or abnormal night sweats Eyes: Denies blurriness of vision, double vision or watery eyes Ears, nose, mouth, throat, and face: Denies mucositis or sore throat Respiratory: Denies cough, dyspnea or wheezes Cardiovascular: Denies palpitation, chest discomfort or lower  extremity swelling Gastrointestinal:  Denies nausea, heartburn or change in bowel habits Skin: Denies abnormal skin rashes Lymphatics: Denies new lymphadenopathy or easy bruising Neurological:Denies numbness, tingling or new weaknesses Behavioral/Psych: Mood is stable, no new changes  All other systems were reviewed with the patient and are negative.  PHYSICAL EXAMINATION: ECOG PERFORMANCE STATUS: 0 - Asymptomatic  Vitals:   05/18/24 1204 05/18/24 1205  BP: (!) 142/76 130/72  Pulse: 68   Resp: 17   Temp: 98.2 F (36.8 C)   SpO2: 95%      Filed Weights   05/18/24 1204  Weight: 202 lb 11.2 oz (91.9 kg)    General Appearance: Alert, oriented and in no acute distress. Breast exam: Bilateral breast inspected and palpated.  Posttreatment changes noted in the left breast.  Some tenderness next to surgical incision likely fat necrosis.  No obvious palpable masses.  No regional adenopathy.  LABORATORY DATA:  I have reviewed the data as listed Lab Results  Component Value Date   WBC 6.3 12/29/2023   HGB 13.6 12/29/2023   HCT 42.0 12/29/2023   MCV 92.7 12/29/2023   PLT 265 12/29/2023   Lab Results  Component Value Date   NA 141 12/29/2023   K 4.6 12/29/2023   CL 105 12/29/2023   CO2 29 12/29/2023    RADIOGRAPHIC STUDIES: I have personally reviewed the radiological reports and agreed with the findings in the report.  ASSESSMENT AND PLAN:  Malignant neoplasm of upper-outer quadrant of left breast in female, estrogen receptor negative (HCC) This is a pleasant 73 year old postmenopausal female patient with newly diagnosed left breast T2 N0 triple negative invasive ductal carcinoma referred to breast [facility] for additional recommendations.  Given triple negative biology and size of the tumor over 2 cm, we have discussed about both neoadjuvant and adjuvant approach. She is s/p surgery, refused adj chemotherapy.  Assessment and Plan Assessment & Plan Breast pain and armpit  pain Persistent left breast and armpit pain likely due to post-surgical and post-radiation changes, with scar tissue and possible fat necrosis. Unlikely related to cancer recurrence.  She is due for an upcoming mammogram. - Proceed with mammogram on June 5th. - Report any changes or worsening of pain. - Consider using a supportive bra for relief. - Discuss the option of the new blood test for cancer recurrence if interested.  She is not interested in doing Guardant reveal.  Lymphedema Lymphedema in the left arm post-lymph node removal, contributing to armpit discomfort. - Encourage resumption of physical therapy exercises. - Consider referral to a lymphedema specialist if symptoms persist or worsen.      Murleen Arms, MD 05/18/24

## 2024-05-18 ENCOUNTER — Inpatient Hospital Stay: Payer: Self-pay | Attending: Hematology and Oncology | Admitting: Hematology and Oncology

## 2024-05-18 VITALS — BP 130/72 | HR 68 | Temp 98.2°F | Resp 17 | Wt 202.7 lb

## 2024-05-18 DIAGNOSIS — C50412 Malignant neoplasm of upper-outer quadrant of left female breast: Secondary | ICD-10-CM | POA: Diagnosis not present

## 2024-05-18 DIAGNOSIS — Z8349 Family history of other endocrine, nutritional and metabolic diseases: Secondary | ICD-10-CM | POA: Insufficient documentation

## 2024-05-18 DIAGNOSIS — Z888 Allergy status to other drugs, medicaments and biological substances status: Secondary | ICD-10-CM | POA: Diagnosis not present

## 2024-05-18 DIAGNOSIS — N644 Mastodynia: Secondary | ICD-10-CM | POA: Insufficient documentation

## 2024-05-18 DIAGNOSIS — J449 Chronic obstructive pulmonary disease, unspecified: Secondary | ICD-10-CM | POA: Insufficient documentation

## 2024-05-18 DIAGNOSIS — Z1722 Progesterone receptor negative status: Secondary | ICD-10-CM | POA: Insufficient documentation

## 2024-05-18 DIAGNOSIS — K589 Irritable bowel syndrome without diarrhea: Secondary | ICD-10-CM | POA: Diagnosis not present

## 2024-05-18 DIAGNOSIS — Z83719 Family history of colon polyps, unspecified: Secondary | ICD-10-CM | POA: Diagnosis not present

## 2024-05-18 DIAGNOSIS — Z634 Disappearance and death of family member: Secondary | ICD-10-CM | POA: Diagnosis not present

## 2024-05-18 DIAGNOSIS — Z79899 Other long term (current) drug therapy: Secondary | ICD-10-CM | POA: Insufficient documentation

## 2024-05-18 DIAGNOSIS — I890 Lymphedema, not elsewhere classified: Secondary | ICD-10-CM | POA: Insufficient documentation

## 2024-05-18 DIAGNOSIS — F419 Anxiety disorder, unspecified: Secondary | ICD-10-CM | POA: Insufficient documentation

## 2024-05-18 DIAGNOSIS — F32A Depression, unspecified: Secondary | ICD-10-CM | POA: Insufficient documentation

## 2024-05-18 DIAGNOSIS — Z8262 Family history of osteoporosis: Secondary | ICD-10-CM | POA: Insufficient documentation

## 2024-05-18 DIAGNOSIS — Z9049 Acquired absence of other specified parts of digestive tract: Secondary | ICD-10-CM | POA: Insufficient documentation

## 2024-05-18 DIAGNOSIS — Z8379 Family history of other diseases of the digestive system: Secondary | ICD-10-CM | POA: Insufficient documentation

## 2024-05-18 DIAGNOSIS — Z820 Family history of epilepsy and other diseases of the nervous system: Secondary | ICD-10-CM | POA: Insufficient documentation

## 2024-05-18 DIAGNOSIS — Z8249 Family history of ischemic heart disease and other diseases of the circulatory system: Secondary | ICD-10-CM | POA: Diagnosis not present

## 2024-05-18 DIAGNOSIS — Z881 Allergy status to other antibiotic agents status: Secondary | ICD-10-CM | POA: Insufficient documentation

## 2024-05-18 DIAGNOSIS — Z171 Estrogen receptor negative status [ER-]: Secondary | ICD-10-CM | POA: Diagnosis not present

## 2024-05-18 DIAGNOSIS — Z90710 Acquired absence of both cervix and uterus: Secondary | ICD-10-CM | POA: Diagnosis not present

## 2024-05-18 DIAGNOSIS — Z1732 Human epidermal growth factor receptor 2 negative status: Secondary | ICD-10-CM | POA: Insufficient documentation

## 2024-05-18 DIAGNOSIS — Z882 Allergy status to sulfonamides status: Secondary | ICD-10-CM | POA: Diagnosis not present

## 2024-05-18 DIAGNOSIS — Z808 Family history of malignant neoplasm of other organs or systems: Secondary | ICD-10-CM | POA: Insufficient documentation

## 2024-05-18 DIAGNOSIS — Z87891 Personal history of nicotine dependence: Secondary | ICD-10-CM | POA: Insufficient documentation

## 2024-05-18 DIAGNOSIS — Z860101 Personal history of adenomatous and serrated colon polyps: Secondary | ICD-10-CM | POA: Insufficient documentation

## 2024-05-18 DIAGNOSIS — Z801 Family history of malignant neoplasm of trachea, bronchus and lung: Secondary | ICD-10-CM | POA: Insufficient documentation

## 2024-05-19 ENCOUNTER — Ambulatory Visit: Payer: Self-pay

## 2024-05-26 ENCOUNTER — Ambulatory Visit: Payer: Self-pay

## 2024-05-28 ENCOUNTER — Other Ambulatory Visit: Payer: Self-pay | Admitting: Student

## 2024-05-28 DIAGNOSIS — M79622 Pain in left upper arm: Secondary | ICD-10-CM

## 2024-05-28 DIAGNOSIS — N644 Mastodynia: Secondary | ICD-10-CM

## 2024-05-28 DIAGNOSIS — Z853 Personal history of malignant neoplasm of breast: Secondary | ICD-10-CM

## 2024-05-28 DIAGNOSIS — N6489 Other specified disorders of breast: Secondary | ICD-10-CM

## 2024-05-31 DIAGNOSIS — E559 Vitamin D deficiency, unspecified: Secondary | ICD-10-CM | POA: Diagnosis not present

## 2024-05-31 DIAGNOSIS — E7800 Pure hypercholesterolemia, unspecified: Secondary | ICD-10-CM | POA: Diagnosis not present

## 2024-05-31 DIAGNOSIS — E039 Hypothyroidism, unspecified: Secondary | ICD-10-CM | POA: Diagnosis not present

## 2024-06-03 ENCOUNTER — Ambulatory Visit
Admission: RE | Admit: 2024-06-03 | Discharge: 2024-06-03 | Disposition: A | Discharge status: Home / Self Care | Source: Ambulatory Visit | Attending: Student

## 2024-06-03 ENCOUNTER — Ambulatory Visit
Admission: RE | Admit: 2024-06-03 | Discharge: 2024-06-03 | Disposition: A | Discharge status: Home / Self Care | Source: Ambulatory Visit | Attending: Student | Admitting: Student

## 2024-06-03 DIAGNOSIS — N644 Mastodynia: Secondary | ICD-10-CM

## 2024-06-03 DIAGNOSIS — M79622 Pain in left upper arm: Secondary | ICD-10-CM

## 2024-06-03 DIAGNOSIS — N6489 Other specified disorders of breast: Secondary | ICD-10-CM

## 2024-06-03 HISTORY — DX: Personal history of irradiation: Z92.3

## 2024-06-07 DIAGNOSIS — G4700 Insomnia, unspecified: Secondary | ICD-10-CM | POA: Diagnosis not present

## 2024-06-07 DIAGNOSIS — Z0000 Encounter for general adult medical examination without abnormal findings: Secondary | ICD-10-CM | POA: Diagnosis not present

## 2024-06-14 ENCOUNTER — Telehealth: Payer: Self-pay

## 2024-06-14 NOTE — Telephone Encounter (Signed)
 Copied from CRM [id number]. Topic: General - Other >> Jun 14, 2024  8:21 AM [name] [name] wrote: Reason for CRM:   Pt is scheduled for CT scan of chest on 06/30; however, she had one performed in 03/2024 by her oncologist. She would like to verify if she still requires the scan scheduled in 05/2024  CB# [phone]  Please advise if it is still needed

## 2024-06-16 NOTE — Telephone Encounter (Signed)
 No need for the scan June 30th, please cancel. She can keep her July appointment with me.

## 2024-06-16 NOTE — Telephone Encounter (Signed)
Pt notified on VM. 

## 2024-06-28 ENCOUNTER — Ambulatory Visit (HOSPITAL_COMMUNITY)

## 2024-07-06 DIAGNOSIS — H04123 Dry eye syndrome of bilateral lacrimal glands: Secondary | ICD-10-CM | POA: Diagnosis not present

## 2024-07-13 ENCOUNTER — Encounter: Payer: Self-pay | Admitting: Internal Medicine

## 2024-07-13 ENCOUNTER — Ambulatory Visit: Admitting: Internal Medicine

## 2024-07-13 VITALS — BP 122/86 | HR 56 | Ht 69.5 in | Wt 200.8 lb

## 2024-07-13 DIAGNOSIS — Z87891 Personal history of nicotine dependence: Secondary | ICD-10-CM

## 2024-07-13 DIAGNOSIS — R918 Other nonspecific abnormal finding of lung field: Secondary | ICD-10-CM | POA: Diagnosis not present

## 2024-07-13 DIAGNOSIS — R0982 Postnasal drip: Secondary | ICD-10-CM

## 2024-07-13 MED ORDER — FLUTICASONE PROPIONATE 50 MCG/ACT NA SUSP: 1.0000 | Freq: Every day | NASAL | 11 refills | Status: AC

## 2024-07-13 NOTE — Progress Notes (Signed)
 [name] [name]    [id number]    05-31-1951  Primary Care Physician:Pharr, Ryan, MD Date of Appointment: 07/13/2024 Established Patient Visit  Chief complaint:   Chief Complaint  Patient presents with   Follow-up    HPI: [name] [name] is a 73 y.o. woman with history of tobacco use(quit 2013), copd, and seasonal allergic rhinitis.  Interval Updates: Here for follow up for pulmonary nodules - CT Chest reviewed personally. Shows resolving and decreasing RML cluster of nodules consistent with infectious bronchitis.   Feels like she is waking up in the morning with mucus production.  Current therapy: not on any inhalers. Doesn't feel like she needs them. Not needed any prednisone  for her breathing.   Her husband died 2 months ago and she is in the actively grieving stage. Very tearful today.   I have reviewed the patient's family social and past medical history and updated as appropriate.   Past Medical History:  Diagnosis Date   Adenomatous colon polyp    Allergy    Anxiety    Arthritis    bilateral hands. lower back   Cancer [facility])    skin   Cataract    bilateral   COPD (chronic obstructive pulmonary disease) (HCC)    Depression    Diverticulosis    Endometriosis    GERD (gastroesophageal reflux disease)    Glaucoma    Headache    Migraines   Hiatal hernia    History of left breast cancer 05/2023   History of radiation therapy    Left breast- 09/25/23-10/17/23- Dr. Lynwood Nasuti   IBS (irritable bowel syndrome)    Inguinal hernia    Personal history of radiation therapy    Pneumothorax 1987   PONV (postoperative nausea and vomiting)     Past Surgical History:  Procedure Laterality Date   ABDOMINAL HYSTERECTOMY  1992   ANTERIOR CERVICAL DECOMP/DISCECTOMY FUSION N/A 09/23/2022   Procedure: Anterior Decompression Fusion,PLATE/SCREWS Cervical five-six; REMOVAL OLD PLATE;  Surgeon: Mavis Purchase, MD;  Location: [facility] OR;  Service: Neurosurgery;   Laterality: N/A;   APPENDECTOMY  1992   BACK SURGERY     cervical   BREAST BIOPSY Left 06/23/2023   US  LT BREAST BX W LOC DEV 1ST LESION IMG BX SPEC US  GUIDE 06/23/2023 [facility]MO[facility]   BREAST BIOPSY Left 07/28/2023   US  LT RADIOACTIVE SEED LOC 07/28/2023 [facility]MO[facility]   BREAST LUMPECTOMY WITH RADIOACTIVE SEED AND SENTINEL LYMPH NODE BIOPSY Left 07/29/2023   Procedure: LEFT BREAST SEED LUMPECTOMY WITH LEFT SENTINEL LYMPH NODE MAPPING;  Surgeon: Vanderbilt Ned, MD;  Location: [facility] OR;  Service: General;  Laterality: Left;  PEC BLOCK   CATARACT EXTRACTION Bilateral    2021, 2022   COLONOSCOPY  2023   PLEURAL SCARIFICATION     ruptured disk     torn tendon     right arm   TUBAL LIGATION      Family History  Problem Relation Age of Onset   Heart failure Mother    Crohn's disease Sister    Osteoporosis Sister    Neuropathy Sister    Colon polyps Sister    Lung cancer Sister 48       smoked   Thyroid  disease Sister    Thyroid  cancer Maternal Grandmother    Colon cancer Neg Hx    Breast cancer Neg Hx    Esophageal cancer Neg Hx    Pancreatic cancer Neg Hx  Stomach cancer Neg Hx     Social History   Occupational History   Occupation: locksmith  Tobacco Use   Smoking status: Former    Current packs/day: 0.00    Average packs/day: 1.5 packs/day for 40.1 years (60.2 ttl pk-yrs)    Types: Cigarettes    Start date: 12/31/1971    Quit date: 02/10/2012    Years since quitting: 12.4   Smokeless tobacco: Never  Vaping Use   Vaping status: Never Used  Substance and Sexual Activity   Alcohol use: No   Drug use: No   Sexual activity: Never     Physical Exam: Blood pressure 122/86, pulse (!) 56, height 5' 9.5 (1.765 m), weight 200 lb 12.8 oz (91.1 kg), SpO2 95%.  Gen:     NAD, tearful ENT: nares patent, no polyps Lungs:  ctab no wheeze CV:         RRR no mrg    Data Reviewed: Imaging: I have personally reviewed the CT Chest from Oct 2022 with stable 6mm  pulmonary nodule.   PFTs: None on file.  Labs: Lab Results  Component Value Date   WBC 6.3 12/29/2023   HGB 13.6 12/29/2023   HCT 42.0 12/29/2023   MCV 92.7 12/29/2023   PLT 265 12/29/2023   Lab Results  Component Value Date   NA 141 12/29/2023   K 4.6 12/29/2023   CL 105 12/29/2023   CO2 29 12/29/2023    Immunization status: Immunization History  Administered Date(s) Administered   Influenza, High Dose Seasonal PF 08/28/2016, 10/16/2018, 09/16/2019, 09/18/2020   Influenza, Quadrivalent, Recombinant, Inj, Pf 10/10/2017, 10/16/2018, 09/15/2020, 10/02/2021   Influenza, Seasonal, Injecte, Preservative Fre 09/13/2014   Influenza-Unspecified 09/22/2012, 09/29/2018, 09/15/2020, 10/02/2021, 09/11/2022   PFIZER(Purple Top)SARS-COV-2 Vaccination 01/21/2020, 02/11/2020   Pneumococcal Conjugate-13 03/20/2017   Pneumococcal Polysaccharide-23 03/30/2018   Td 12/26/2014   Zoster Recombinant(Shingrix) 09/22/2019   Zoster, Live 03/01/2015    Assessment:  COPD moderate FEV1 65% of predicted, controlled Chronic Cough - seasonal allergic rhinitis, not controlled GERD controlled Need for lung cancer screening Multiple pulmonary nodules  Plan/Recommendations:   Continue albuterol  as needed.  If you start using albuterol  more frequently let me know and we can add back maintenance therapy.   CT Chest shows resolving nodules that were likely infectious in nature. No cancer. Next scan for lung cancer screening April 2026.   Daily morning mucus production likely post nasal drainage from allergies.  You can stop the cetirizine  if not helping, and I would start taking the flonase  again.   Return to Care: Return in about 6 months (around 01/13/2025).   Verdon Gore, MD Pulmonary and Critical Care Medicine [facility] Office:[phone]

## 2024-07-13 NOTE — Patient Instructions (Addendum)
 It was a pleasure to see you today!  Please schedule follow up with myself in 6 months.  If my schedule is not open yet, we will contact you with a reminder closer to that time. Please call [phone] if you haven't heard from us  a month before, and always call us  sooner if issues or concerns arise. You can also send us  a message through MyChart, but but aware that this is not to be used for urgent issues and it may take up to 5-7 days to receive a reply. Please be aware that you will likely be able to view your results before I have a chance to respond to them. Please give us  5 business days to respond to any non-urgent results.    Continue albuterol  as needed.  If you start using albuterol  more frequently let me know and we can add back maintenance therapy.   CT Chest shows resolving nodules that were likely infectious in nature. No cancer. Next scan for lung cancer screening April 2026.   Daily morning mucus production likely post nasal drainage from allergies.  You can stop the cetirizine  if not helping, and I would start taking the flonase  again.  Flonase  - 1 spray on each side of your nose twice a day for first week, then 1 spray on each side.   Instructions for use: If you also use a saline nasal spray or rinse, use that first. Position the head with the chin slightly tucked. Use the right hand to spray into the left nostril and the right hand to spray into the left nostril.   Point the bottle away from the septum of your nose (cartilage that divides the two sides of your nose).  Hold the nostril closed on the opposite side from where you will spray Spray once and gently sniff to pull the medicine into the higher parts of your nose.  Don't sniff too hard as the medicine will drain down the back of your throat instead. Repeat with a second spray on the same side if prescribed. Repeat on the other side of your nose.

## 2024-07-27 DIAGNOSIS — M545 Low back pain, unspecified: Secondary | ICD-10-CM | POA: Diagnosis not present

## 2024-07-27 DIAGNOSIS — G8929 Other chronic pain: Secondary | ICD-10-CM | POA: Diagnosis not present

## 2024-07-27 DIAGNOSIS — Z8781 Personal history of (healed) traumatic fracture: Secondary | ICD-10-CM | POA: Diagnosis not present

## 2024-07-27 DIAGNOSIS — R2689 Other abnormalities of gait and mobility: Secondary | ICD-10-CM | POA: Diagnosis not present

## 2024-08-02 ENCOUNTER — Other Ambulatory Visit: Payer: Self-pay | Admitting: Internal Medicine

## 2024-08-02 DIAGNOSIS — M545 Low back pain, unspecified: Secondary | ICD-10-CM

## 2024-08-06 ENCOUNTER — Other Ambulatory Visit: Payer: Self-pay | Admitting: Internal Medicine

## 2024-08-06 DIAGNOSIS — Z8781 Personal history of (healed) traumatic fracture: Secondary | ICD-10-CM

## 2024-08-15 ENCOUNTER — Other Ambulatory Visit

## 2024-08-15 ENCOUNTER — Ambulatory Visit
Admission: RE | Admit: 2024-08-15 | Discharge: 2024-08-15 | Disposition: A | Discharge status: Home / Self Care | Source: Ambulatory Visit | Attending: Internal Medicine | Admitting: Internal Medicine

## 2024-08-15 DIAGNOSIS — Z8781 Personal history of (healed) traumatic fracture: Secondary | ICD-10-CM

## 2024-08-15 DIAGNOSIS — M5124 Other intervertebral disc displacement, thoracic region: Secondary | ICD-10-CM | POA: Diagnosis not present

## 2024-08-15 DIAGNOSIS — M48061 Spinal stenosis, lumbar region without neurogenic claudication: Secondary | ICD-10-CM | POA: Diagnosis not present

## 2024-08-15 DIAGNOSIS — M4804 Spinal stenosis, thoracic region: Secondary | ICD-10-CM | POA: Diagnosis not present

## 2024-08-15 DIAGNOSIS — M47816 Spondylosis without myelopathy or radiculopathy, lumbar region: Secondary | ICD-10-CM | POA: Diagnosis not present

## 2024-08-15 DIAGNOSIS — M545 Low back pain, unspecified: Secondary | ICD-10-CM

## 2024-08-15 DIAGNOSIS — M5126 Other intervertebral disc displacement, lumbar region: Secondary | ICD-10-CM | POA: Diagnosis not present

## 2024-08-16 DIAGNOSIS — M810 Age-related osteoporosis without current pathological fracture: Secondary | ICD-10-CM | POA: Diagnosis not present

## 2024-08-19 DIAGNOSIS — R109 Unspecified abdominal pain: Secondary | ICD-10-CM | POA: Diagnosis not present

## 2024-08-19 DIAGNOSIS — M5459 Other low back pain: Secondary | ICD-10-CM | POA: Diagnosis not present

## 2024-08-19 DIAGNOSIS — M4804 Spinal stenosis, thoracic region: Secondary | ICD-10-CM | POA: Diagnosis not present

## 2024-08-21 ENCOUNTER — Other Ambulatory Visit

## 2024-09-07 DIAGNOSIS — G8929 Other chronic pain: Secondary | ICD-10-CM | POA: Diagnosis not present

## 2024-09-07 DIAGNOSIS — Z6838 Body mass index (BMI) 38.0-38.9, adult: Secondary | ICD-10-CM | POA: Diagnosis not present

## 2024-09-27 DIAGNOSIS — N1831 Chronic kidney disease, stage 3a: Secondary | ICD-10-CM | POA: Diagnosis not present

## 2024-09-27 DIAGNOSIS — F339 Major depressive disorder, recurrent, unspecified: Secondary | ICD-10-CM | POA: Diagnosis not present

## 2024-09-27 DIAGNOSIS — Z23 Encounter for immunization: Secondary | ICD-10-CM | POA: Diagnosis not present

## 2024-10-18 ENCOUNTER — Other Ambulatory Visit: Payer: Self-pay | Admitting: Gastroenterology

## 2024-10-19 DIAGNOSIS — W57XXXA Bitten or stung by nonvenomous insect and other nonvenomous arthropods, initial encounter: Secondary | ICD-10-CM | POA: Diagnosis not present

## 2024-10-19 DIAGNOSIS — J3489 Other specified disorders of nose and nasal sinuses: Secondary | ICD-10-CM | POA: Diagnosis not present

## 2024-10-19 DIAGNOSIS — S51819A Laceration without foreign body of unspecified forearm, initial encounter: Secondary | ICD-10-CM | POA: Diagnosis not present

## 2024-10-27 ENCOUNTER — Ambulatory Visit

## 2024-10-28 ENCOUNTER — Ambulatory Visit (INDEPENDENT_AMBULATORY_CARE_PROVIDER_SITE_OTHER)

## 2024-10-28 VITALS — BP 161/88 | HR 57 | Temp 98.0°F | Resp 20 | Ht 69.5 in | Wt 201.2 lb

## 2024-10-28 DIAGNOSIS — I2510 Atherosclerotic heart disease of native coronary artery without angina pectoris: Secondary | ICD-10-CM | POA: Diagnosis not present

## 2024-10-28 DIAGNOSIS — E7800 Pure hypercholesterolemia, unspecified: Secondary | ICD-10-CM | POA: Diagnosis not present

## 2024-10-28 DIAGNOSIS — T466X5A Adverse effect of antihyperlipidemic and antiarteriosclerotic drugs, initial encounter: Secondary | ICD-10-CM

## 2024-10-28 MED ORDER — INCLISIRAN SODIUM 284 MG/1.5ML ~~LOC~~ SOSY
284.0000 mg | PREFILLED_SYRINGE | Freq: Once | SUBCUTANEOUS | Status: AC
  Administered 2024-10-28: 284 mg via SUBCUTANEOUS
  Filled 2024-10-28: qty 1.5

## 2024-10-28 NOTE — Progress Notes (Signed)
 Diagnosis: Hyperlipidemia  Provider:  Mannam, Praveen MD  Procedure: Injection  Leqvio  (inclisiran), Dose: 284 mg, Site: subcutaneous, Number of injections: 1  Injection Site(s): Left quadriceps  Post Care: Patient declined observation  Discharge: Condition: Good, Destination: Home . AVS Declined  Performed by:  Leita FORBES Miles, LPN

## 2024-11-08 DIAGNOSIS — E049 Nontoxic goiter, unspecified: Secondary | ICD-10-CM | POA: Diagnosis not present

## 2024-11-17 ENCOUNTER — Telehealth: Payer: Self-pay

## 2024-11-17 NOTE — Telephone Encounter (Signed)
 Spoke with patient and confirmed appointment for 11/20.

## 2024-11-18 ENCOUNTER — Inpatient Hospital Stay: Attending: Hematology and Oncology | Admitting: Hematology and Oncology

## 2024-11-18 VITALS — BP 150/79 | HR 58 | Temp 98.5°F | Resp 17 | Ht 69.5 in | Wt 202.1 lb

## 2024-11-18 DIAGNOSIS — F419 Anxiety disorder, unspecified: Secondary | ICD-10-CM | POA: Insufficient documentation

## 2024-11-18 DIAGNOSIS — C50412 Malignant neoplasm of upper-outer quadrant of left female breast: Secondary | ICD-10-CM | POA: Insufficient documentation

## 2024-11-18 DIAGNOSIS — Z8249 Family history of ischemic heart disease and other diseases of the circulatory system: Secondary | ICD-10-CM | POA: Diagnosis not present

## 2024-11-18 DIAGNOSIS — N644 Mastodynia: Secondary | ICD-10-CM | POA: Diagnosis not present

## 2024-11-18 DIAGNOSIS — Z923 Personal history of irradiation: Secondary | ICD-10-CM | POA: Insufficient documentation

## 2024-11-18 DIAGNOSIS — Z888 Allergy status to other drugs, medicaments and biological substances status: Secondary | ICD-10-CM | POA: Diagnosis not present

## 2024-11-18 DIAGNOSIS — Z882 Allergy status to sulfonamides status: Secondary | ICD-10-CM | POA: Insufficient documentation

## 2024-11-18 DIAGNOSIS — Z8379 Family history of other diseases of the digestive system: Secondary | ICD-10-CM | POA: Diagnosis not present

## 2024-11-18 DIAGNOSIS — Z9049 Acquired absence of other specified parts of digestive tract: Secondary | ICD-10-CM | POA: Diagnosis not present

## 2024-11-18 DIAGNOSIS — Z17421 Hormone receptor negative with human epidermal growth factor receptor 2 negative status: Secondary | ICD-10-CM | POA: Diagnosis not present

## 2024-11-18 DIAGNOSIS — Z881 Allergy status to other antibiotic agents status: Secondary | ICD-10-CM | POA: Diagnosis not present

## 2024-11-18 DIAGNOSIS — Z79899 Other long term (current) drug therapy: Secondary | ICD-10-CM | POA: Diagnosis not present

## 2024-11-18 DIAGNOSIS — Z8349 Family history of other endocrine, nutritional and metabolic diseases: Secondary | ICD-10-CM | POA: Diagnosis not present

## 2024-11-18 DIAGNOSIS — R21 Rash and other nonspecific skin eruption: Secondary | ICD-10-CM | POA: Insufficient documentation

## 2024-11-18 DIAGNOSIS — Z808 Family history of malignant neoplasm of other organs or systems: Secondary | ICD-10-CM | POA: Insufficient documentation

## 2024-11-18 DIAGNOSIS — Z860101 Personal history of adenomatous and serrated colon polyps: Secondary | ICD-10-CM | POA: Diagnosis not present

## 2024-11-18 DIAGNOSIS — J449 Chronic obstructive pulmonary disease, unspecified: Secondary | ICD-10-CM | POA: Insufficient documentation

## 2024-11-18 DIAGNOSIS — I890 Lymphedema, not elsewhere classified: Secondary | ICD-10-CM | POA: Diagnosis not present

## 2024-11-18 DIAGNOSIS — Z83719 Family history of colon polyps, unspecified: Secondary | ICD-10-CM | POA: Diagnosis not present

## 2024-11-18 DIAGNOSIS — Z87891 Personal history of nicotine dependence: Secondary | ICD-10-CM | POA: Diagnosis not present

## 2024-11-18 DIAGNOSIS — Z171 Estrogen receptor negative status [ER-]: Secondary | ICD-10-CM | POA: Diagnosis not present

## 2024-11-18 DIAGNOSIS — R634 Abnormal weight loss: Secondary | ICD-10-CM | POA: Insufficient documentation

## 2024-11-18 DIAGNOSIS — M7989 Other specified soft tissue disorders: Secondary | ICD-10-CM

## 2024-11-18 DIAGNOSIS — Z8262 Family history of osteoporosis: Secondary | ICD-10-CM | POA: Insufficient documentation

## 2024-11-18 DIAGNOSIS — Z801 Family history of malignant neoplasm of trachea, bronchus and lung: Secondary | ICD-10-CM | POA: Insufficient documentation

## 2024-11-18 DIAGNOSIS — Z90710 Acquired absence of both cervix and uterus: Secondary | ICD-10-CM | POA: Insufficient documentation

## 2024-11-18 NOTE — Assessment & Plan Note (Addendum)
 This is a pleasant 73 year old postmenopausal female patient with newly diagnosed left breast T2 N0 triple negative invasive ductal carcinoma referred to breast [facility] for additional recommendations.  Given triple negative biology and size of the tumor over 2 cm, we have discussed about both neoadjuvant and adjuvant approach. She is s/p surgery, refused adj chemotherapy Assessment & Plan

## 2024-11-18 NOTE — Progress Notes (Signed)
 [facility] CONSULT NOTE  Patient Care Team: Clarice Nottingham, MD as PCP - General (Internal Medicine) Loretha Ash, MD as Consulting Physician (Hematology and Oncology) Vanderbilt Ned, MD as Consulting Physician (General Surgery) Shannon Agent, MD as Consulting Physician (Radiation Oncology)  CHIEF COMPLAINTS/PURPOSE OF CONSULTATION:  Newly diagnosed breast cancer  HISTORY OF PRESENTING ILLNESS:  [name] [name] 73 y.o. female is here because of recent diagnosis of left breast cancer  I reviewed her records extensively and collaborated the history with the patient.  SUMMARY OF ONCOLOGIC HISTORY: Oncology History  Malignant neoplasm of upper-outer quadrant of left breast in female, estrogen receptor negative (HCC)  06/03/2023 Mammogram   Screening mammogram bilaterally showed possible mass in the left breast warranting further evaluation.  Diagnostic mammogram confirmed highly suspicious 2.2 cm upper outer left breast mass, no abnormal appearing left axillary lymph nodes   06/23/2023 Pathology Results   Pathology results from the left breast needle core biopsy showed grade 3 invasive poorly differentiated ductal carcinoma, focal high-grade DCIS, cribriform type, negative for angiolymphatic invasion, negative for micro calcs, prognostic showed ER 0% negative, PR 0% negative, HER2 negative and Ki-67 of 95%.   06/30/2023 Initial Diagnosis   Malignant neoplasm of upper-outer quadrant of left breast in female, estrogen receptor negative (HCC)    Genetic Testing   [facility] Custom Panel+RNA was Negative. Of note, a variant of uncertain significance was detected in the MSH3 gene (c.1027+4T>C (Intronic)). Report date is 07/10/2023.  The Custom Hereditary Cancers Panel offered by [employer] includes sequencing and/or deletion duplication testing of the following 46 genes: APC, ATM, AXIN2, BAP1, BARD1, BMPR1A, BRCA1, BRCA2, BRIP1, CDH1, CDK4, CDKN2A (p14ARF and p16INK4a only), CHEK2, CTNNA1,  DICER1, EPCAM (Deletion/duplication testing only), FH, GREM1 (promoter region duplication testing only), HOXB13, KIT, MBD4, MEN1, MLH1, MSH2, MSH3, MSH6, MUTYH, NF1, NHTL1, PALB2, PDGFRA, PMS2, POLD1, POLE, PRKAR1A, PTEN, RAD51C, RAD51D, RET, SMAD4, SMARCA4. STK11, TP53, TSC1, TSC2, and VHL.    07/02/2023 Cancer Staging   Staging form: Breast, AJCC 8th Edition - Clinical stage from 07/02/2023: Stage IIB (cT2, cN0, cM0, G3, ER-, PR-, HER2-) - Signed by Loretha Ash, MD on 11/18/2024 Stage prefix: Initial diagnosis Histologic grading system: 3 grade system Laterality: Left Staged by: Pathologist and managing physician Stage used in treatment planning: Yes National guidelines used in treatment planning: Yes Type of national guideline used in treatment planning: NCCN   09/25/2023 - 10/17/2023 Radiation Therapy   Plan Name: Breast_L Site: Breast, Left Technique: 3D Mode: Photon Dose Per Fraction: 2.67 Gy Prescribed Dose (Delivered / Prescribed): 42.72 Gy / 42.72 Gy Prescribed Fxs (Delivered / Prescribed): 16 / 16    This is a pleasant 73 year old female patient with past medical history significant for COPD, not oxygen dependent, irritable bowel syndrome, diarrhea predominant depression, well-controlled on Zoloft, anxiety, arthritis referred to breast MDC for new diagnosis of left breast invasive ductal carcinoma.  She declined adjuvant chemotherapy.  History of Present Illness  [name] [name] is a 73 year old female with a history of breast surgery and radiation who presents with worsening pain in her armpit and breast.  She experiences persistent pain in her armpit and breast, which has worsened since her last visit six months ago. She describes her pain as being in her armpit and breast and reports that the area is very sensitive. She has not discussed these symptoms with her surgeon. She has been performing physical therapy exercises, including 'wall walking', to help alleviate the  symptoms.  She has noticed  a new lump in her armpit and reports two tender spots on her breast. She mentions that the area where a pin and clip were placed during her treatment is still present.  She experiences lymphedema in her hand. She has tried wearing prescription bras for compression but finds them uncomfortable and can only wear them for short periods. She has also tried exercise bras for some relief.  In terms of her social history, she is coping with the loss of her husband and mentions that she does not have close family support. She keeps herself busy with yard work and other activities to manage her grief. She plans to spend Thanksgiving with her sister-in-law, who also recently lost her husband.  No issues with breathing, bowel movements, or urination. She notes a weight loss of about five pounds.   Rest of the pertinent 10 point ROS reviewed and neg.  MEDICAL HISTORY:  Past Medical History:  Diagnosis Date   Adenomatous colon polyp    Allergy    Anxiety    Arthritis    bilateral hands. lower back   Cancer [facility])    skin   Cataract    bilateral   COPD (chronic obstructive pulmonary disease) (HCC)    Depression    Diverticulosis    Endometriosis    GERD (gastroesophageal reflux disease)    Glaucoma    Headache    Migraines   Hiatal hernia    History of left breast cancer 05/2023   History of radiation therapy    Left breast- 09/25/23-10/17/23- Dr. Lynwood Nasuti   IBS (irritable bowel syndrome)    Inguinal hernia    Personal history of radiation therapy    Pneumothorax 1987   PONV (postoperative nausea and vomiting)     SURGICAL HISTORY: Past Surgical History:  Procedure Laterality Date   ABDOMINAL HYSTERECTOMY  1992   ANTERIOR CERVICAL DECOMP/DISCECTOMY FUSION N/A 09/23/2022   Procedure: Anterior Decompression Fusion,PLATE/SCREWS Cervical five-six; REMOVAL OLD PLATE;  Surgeon: Mavis Purchase, MD;  Location: [facility] OR;  Service: Neurosurgery;  Laterality: N/A;    APPENDECTOMY  1992   BACK SURGERY     cervical   BREAST BIOPSY Left 06/23/2023   US  LT BREAST BX W LOC DEV 1ST LESION IMG BX SPEC US  GUIDE 06/23/2023 [facility]MOGRAP[facility]   BREAST BIOPSY Left 07/28/2023   US  LT RADIOACTIVE SEED LOC 07/28/2023 [facility] MAMMOGRAPHY   BREAST LUMPECTOMY WITH RADIOACTIVE SEED AND SENTINEL LYMPH NODE BIOPSY Left 07/29/2023   Procedure: LEFT BREAST SEED LUMPECTOMY WITH LEFT SENTINEL LYMPH NODE MAPPING;  Surgeon: Vanderbilt Ned, MD;  Location: [facility] OR;  Service: General;  Laterality: Left;  PEC BLOCK   CATARACT EXTRACTION Bilateral    2021, 2022   COLONOSCOPY  2023   PLEURAL SCARIFICATION     ruptured disk     torn tendon     right arm   TUBAL LIGATION      SOCIAL HISTORY: Social History   Socioeconomic History   Marital status: Married    Spouse name: Not on file   Number of children: 2   Years of education: Not on file   Highest education level: Not on file  Occupational History   Occupation: locksmith  Tobacco Use   Smoking status: Former    Current packs/day: 0.00    Average packs/day: 1.5 packs/day for 40.1 years (60.2 ttl pk-yrs)    Types: Cigarettes    Start date: 12/31/1971    Quit date: 02/10/2012    Years since quitting: 12.7  Smokeless tobacco: Never  Vaping Use   Vaping status: Never Used  Substance and Sexual Activity   Alcohol use: No   Drug use: No   Sexual activity: Never  Other Topics Concern   Not on file  Social History Narrative   Not on file   Social Drivers of Health   Financial Resource Strain: Not on file  Food Insecurity: No Food Insecurity (09/15/2023)   Hunger Vital Sign    Worried About Running Out of Food in the Last Year: Never true    Ran Out of Food in the Last Year: Never true  Transportation Needs: No Transportation Needs (09/15/2023)   PRAPARE - Administrator, Civil Service (Medical): No    Lack of Transportation (Non-Medical): No  Physical Activity: Not on file  Stress: Not on file  Social  Connections: Unknown (05/12/2022)   Received from [facility]   Social Network    Social Network: Not on file  Intimate Partner Violence: Not At Risk (09/15/2023)   Humiliation, Afraid, Rape, and Kick questionnaire    Fear of Current or Ex-Partner: No    Emotionally Abused: No    Physically Abused: No    Sexually Abused: No    FAMILY HISTORY: Family History  Problem Relation Age of Onset   Heart failure Mother    Crohn's disease Sister    Osteoporosis Sister    Neuropathy Sister    Colon polyps Sister    Lung cancer Sister 27       smoked   Thyroid  disease Sister    Thyroid  cancer Maternal Grandmother    Colon cancer Neg Hx    Breast cancer Neg Hx    Esophageal cancer Neg Hx    Pancreatic cancer Neg Hx    Stomach cancer Neg Hx     ALLERGIES:  is allergic to bupropion, cefaclor, elemental sulfur, sulfa antibiotics, latex, and ofloxacin.  MEDICATIONS:  Current Outpatient Medications  Medication Sig Dispense Refill   acetaminophen  (TYLENOL ) 325 MG tablet Take 325 mg by mouth every 6 (six) hours as needed.     albuterol  (VENTOLIN  HFA) 108 (90 Base) MCG/ACT inhaler Inhale 2 puffs into the lungs every 6 (six) hours as needed. 18 g 5   ALPRAZolam (XANAX) 0.25 MG tablet Take 0.25 mg by mouth 3 (three) times daily as needed for anxiety.     aspirin  EC 81 MG tablet Take 81 mg by mouth in the morning. Swallow whole.     cetirizine  (ZYRTEC ) 10 MG tablet Take 1 tablet (10 mg total) by mouth daily. 30 tablet 11   Cholecalciferol (VITAMIN D  PO) Take 5,000 Units by mouth in the morning.     denosumab  (PROLIA ) 60 MG/ML SOSY injection Inject 60 mg into the skin every 6 (six) months.     Eyelid Cleansers ([facility] [employer] EYELID CLEANSING EX)      famotidine  (PEPCID ) 20 MG tablet TAKE 1 TABLET BY MOUTH TWICE A DAY AS NEEDED 180 tablet 2   fluticasone  (FLONASE ) 50 MCG/ACT nasal spray Place 1 spray into both nostrils daily. 16 g 11   Ibuprofen (ADVIL) 200 MG CAPS Take 1 capsule by mouth as  needed.     inclisiran (LEQVIO ) 284 MG/1.5ML SOSY injection Inject 284 mg into the skin every 6 (six) months. Every three months     loperamide (IMODIUM) 2 MG capsule Take 2 mg by mouth as needed for diarrhea or loose stools.     promethazine  (PHENERGAN ) 12.5 MG suppository Place  12.5 mg rectally every 6 (six) hours as needed for nausea or vomiting.     sertraline (ZOLOFT) 100 MG tablet Take 50 mg by mouth in the morning.     traZODone (DESYREL) 100 MG tablet Take 100 mg by mouth at bedtime.     XDEMVY 0.25 % SOLN Place 1 drop into both eyes in the morning and at bedtime.     No current facility-administered medications for this visit.    REVIEW OF SYSTEMS:   Constitutional: Denies fevers, chills or abnormal night sweats Eyes: Denies blurriness of vision, double vision or watery eyes Ears, nose, mouth, throat, and face: Denies mucositis or sore throat Respiratory: Denies cough, dyspnea or wheezes Cardiovascular: Denies palpitation, chest discomfort or lower extremity swelling Gastrointestinal:  Denies nausea, heartburn or change in bowel habits Skin: Denies abnormal skin rashes Lymphatics: Denies new lymphadenopathy or easy bruising Neurological:Denies numbness, tingling or new weaknesses Behavioral/Psych: Mood is stable, no new changes  All other systems were reviewed with the patient and are negative.  PHYSICAL EXAMINATION: ECOG PERFORMANCE STATUS: 0 - Asymptomatic  Vitals:   11/18/24 1044  BP: (!) 150/79  Pulse: (!) 58  Resp: 17  Temp: 98.5 F (36.9 C)  SpO2: 97%      Filed Weights   11/18/24 1044  Weight: 202 lb 1.6 oz (91.7 kg)     General Appearance: Alert, oriented and in no acute distress. Breast exam: Bilateral breast inspected and palpated.  Posttreatment changes noted in the left breast.  Some tenderness next to surgical incision likely fat necrosis.  No obvious palpable masses.  No regional adenopathy. She is extremely tender, so I could only do superficial  palpation  LABORATORY DATA:  I have reviewed the data as listed Lab Results  Component Value Date   WBC 6.3 12/29/2023   HGB 13.6 12/29/2023   HCT 42.0 12/29/2023   MCV 92.7 12/29/2023   PLT 265 12/29/2023   Lab Results  Component Value Date   NA 141 12/29/2023   K 4.6 12/29/2023   CL 105 12/29/2023   CO2 29 12/29/2023    RADIOGRAPHIC STUDIES: I have personally reviewed the radiological reports and agreed with the findings in the report.  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 Pain may improve up to two years post-surgery. - Continue physical therapy and stretching exercises for at least two weeks to assess improvement. - Ordered mammogram of the left breast and US  left breast to evaluate for any underlying issues.  Lymphedema of the left upper limb Lymphedema in the left upper limb, particularly in the axilla, likely related to previous surgery and radiation. Compression garments uncomfortable. - Ordered ultrasound of the left axilla to evaluate the lump and assess for any underlying issues.    Amber Stalls, MD 11/18/24

## 2024-11-23 DIAGNOSIS — Z85828 Personal history of other malignant neoplasm of skin: Secondary | ICD-10-CM | POA: Diagnosis not present

## 2024-11-23 DIAGNOSIS — X32XXXD Exposure to sunlight, subsequent encounter: Secondary | ICD-10-CM | POA: Diagnosis not present

## 2024-11-23 DIAGNOSIS — Z08 Encounter for follow-up examination after completed treatment for malignant neoplasm: Secondary | ICD-10-CM | POA: Diagnosis not present

## 2024-11-23 DIAGNOSIS — L570 Actinic keratosis: Secondary | ICD-10-CM | POA: Diagnosis not present

## 2024-12-03 ENCOUNTER — Other Ambulatory Visit: Payer: Self-pay | Admitting: Hematology and Oncology

## 2024-12-03 ENCOUNTER — Ambulatory Visit
Admission: RE | Admit: 2024-12-03 | Discharge: 2024-12-03 | Disposition: A | Source: Ambulatory Visit | Attending: Hematology and Oncology

## 2024-12-03 ENCOUNTER — Ambulatory Visit: Payer: Self-pay | Admitting: Hematology and Oncology

## 2024-12-03 DIAGNOSIS — R928 Other abnormal and inconclusive findings on diagnostic imaging of breast: Secondary | ICD-10-CM | POA: Diagnosis not present

## 2024-12-03 DIAGNOSIS — M7989 Other specified soft tissue disorders: Secondary | ICD-10-CM

## 2024-12-03 DIAGNOSIS — N6325 Unspecified lump in the left breast, overlapping quadrants: Secondary | ICD-10-CM | POA: Diagnosis not present

## 2024-12-03 DIAGNOSIS — N644 Mastodynia: Secondary | ICD-10-CM

## 2024-12-03 DIAGNOSIS — C50412 Malignant neoplasm of upper-outer quadrant of left female breast: Secondary | ICD-10-CM

## 2024-12-03 DIAGNOSIS — N6320 Unspecified lump in the left breast, unspecified quadrant: Secondary | ICD-10-CM

## 2024-12-09 ENCOUNTER — Ambulatory Visit
Admission: RE | Admit: 2024-12-09 | Discharge: 2024-12-09 | Disposition: A | Discharge status: Home / Self Care | Source: Ambulatory Visit | Attending: Hematology and Oncology | Admitting: Hematology and Oncology

## 2024-12-09 ENCOUNTER — Inpatient Hospital Stay: Admission: RE | Admit: 2024-12-09 | Discharge: 2024-12-09 | Attending: Hematology and Oncology

## 2024-12-09 DIAGNOSIS — Z171 Estrogen receptor negative status [ER-]: Secondary | ICD-10-CM

## 2024-12-09 DIAGNOSIS — M7989 Other specified soft tissue disorders: Secondary | ICD-10-CM

## 2024-12-09 DIAGNOSIS — N644 Mastodynia: Secondary | ICD-10-CM

## 2024-12-09 DIAGNOSIS — R928 Other abnormal and inconclusive findings on diagnostic imaging of breast: Secondary | ICD-10-CM | POA: Diagnosis not present

## 2024-12-09 DIAGNOSIS — N6320 Unspecified lump in the left breast, unspecified quadrant: Secondary | ICD-10-CM

## 2024-12-09 HISTORY — PX: BREAST BIOPSY: SHX20

## 2024-12-10 LAB — SURGICAL PATHOLOGY

## 2024-12-16 ENCOUNTER — Inpatient Hospital Stay: Attending: Hematology and Oncology | Admitting: Hematology and Oncology

## 2024-12-16 DIAGNOSIS — C50412 Malignant neoplasm of upper-outer quadrant of left female breast: Secondary | ICD-10-CM

## 2024-12-16 DIAGNOSIS — Z171 Estrogen receptor negative status [ER-]: Secondary | ICD-10-CM | POA: Diagnosis not present

## 2024-12-16 NOTE — Progress Notes (Signed)
 [facility] CONSULT NOTE  Patient Care Team: Clarice Nottingham, MD as PCP - General (Internal Medicine) Loretha Ash, MD as Consulting Physician (Hematology and Oncology) Vanderbilt Ned, MD as Consulting Physician (General Surgery) Shannon Agent, MD as Consulting Physician (Radiation Oncology)  CHIEF COMPLAINTS/PURPOSE OF CONSULTATION:  Newly diagnosed breast cancer  HISTORY OF PRESENTING ILLNESS:  My [name] [name] 73 y.o. female is here because of recent diagnosis of left breast cancer  I reviewed her records extensively and collaborated the history with the patient.  SUMMARY OF ONCOLOGIC HISTORY: Oncology History  Malignant neoplasm of upper-outer quadrant of left breast in female, estrogen receptor negative (HCC)  06/03/2023 Mammogram   Screening mammogram bilaterally showed possible mass in the left breast warranting further evaluation.  Diagnostic mammogram confirmed highly suspicious 2.2 cm upper outer left breast mass, no abnormal appearing left axillary lymph nodes   06/23/2023 Pathology Results   Pathology results from the left breast needle core biopsy showed grade 3 invasive poorly differentiated ductal carcinoma, focal high-grade DCIS, cribriform type, negative for angiolymphatic invasion, negative for micro calcs, prognostic showed ER 0% negative, PR 0% negative, HER2 negative and Ki-67 of 95%.   06/30/2023 Initial Diagnosis   Malignant neoplasm of upper-outer quadrant of left breast in female, estrogen receptor negative (HCC)    Genetic Testing   [employer] Custom Panel+RNA was Negative. Of note, a variant of uncertain significance was detected in the MSH3 gene (c.1027+4T>C (Intronic)). Report date is 07/10/2023.  The Custom Hereditary Cancers Panel offered by [facility] includes sequencing and/or deletion duplication testing of the following 46 genes: APC, ATM, AXIN2, BAP1, BARD1, BMPR1A, BRCA1, BRCA2, BRIP1, CDH1, CDK4, CDKN2A (p14ARF and p16INK4a only), CHEK2, CTNNA1,  DICER1, EPCAM (Deletion/duplication testing only), FH, GREM1 (promoter region duplication testing only), HOXB13, KIT, MBD4, MEN1, MLH1, MSH2, MSH3, MSH6, MUTYH, NF1, NHTL1, PALB2, PDGFRA, PMS2, POLD1, POLE, PRKAR1A, PTEN, RAD51C, RAD51D, RET, SMAD4, SMARCA4. STK11, TP53, TSC1, TSC2, and VHL.    07/02/2023 Cancer Staging   Staging form: Breast, AJCC 8th Edition - Clinical stage from 07/02/2023: Stage IIB (cT2, cN0, cM0, G3, ER-, PR-, HER2-) - Signed by Loretha Ash, MD on 11/18/2024 Stage prefix: Initial diagnosis Histologic grading system: 3 grade system Laterality: Left Staged by: Pathologist and managing physician Stage used in treatment planning: Yes National guidelines used in treatment planning: Yes Type of national guideline used in treatment planning: NCCN   09/25/2023 - 10/17/2023 Radiation Therapy   Plan Name: Breast_L Site: Breast, Left Technique: 3D Mode: Photon Dose Per Fraction: 2.67 Gy Prescribed Dose (Delivered / Prescribed): 42.72 Gy / 42.72 Gy Prescribed Fxs (Delivered / Prescribed): 16 / 16    This is a pleasant 73 year old female patient with past medical history significant for COPD, not oxygen dependent, irritable bowel syndrome, diarrhea predominant depression, well-controlled on Zoloft, anxiety, arthritis referred to breast MDC for new diagnosis of left breast invasive ductal carcinoma.  She declined adjuvant chemotherapy.  History of Present Illness  [name] [name] is a 73 year old female with a history of breast surgery and radiation who presents with worsening pain in her armpit and breast.  She experiences persistent pain in her armpit and chest area at the site of surgery. She today on the phone mentions that there is pain when she breathes. She didn't report this pain when I last saw her. She says the pain is mild and is tolerable. She wants to proceed with physical therapy referral, she says she does exercises at home, and these are still making  her  painful. No fevers or chills She had a mammo and US , no malignancy  Rest of the pertinent 10 point ROS reviewed and neg.  MEDICAL HISTORY:  Past Medical History:  Diagnosis Date   Adenomatous colon polyp    Allergy    Anxiety    Arthritis    bilateral hands. lower back   Cancer [facility])    skin   Cataract    bilateral   COPD (chronic obstructive pulmonary disease) (HCC)    Depression    Diverticulosis    Endometriosis    GERD (gastroesophageal reflux disease)    Glaucoma    Headache    Migraines   Hiatal hernia    History of left breast cancer 05/2023   History of radiation therapy    Left breast- 09/25/23-10/17/23- Dr. Lynwood Nasuti   IBS (irritable bowel syndrome)    Inguinal hernia    Personal history of radiation therapy    Pneumothorax 1987   PONV (postoperative nausea and vomiting)     SURGICAL HISTORY: Past Surgical History:  Procedure Laterality Date   ABDOMINAL HYSTERECTOMY  1992   ANTERIOR CERVICAL DECOMP/DISCECTOMY FUSION N/A 09/23/2022   Procedure: Anterior Decompression Fusion,PLATE/SCREWS Cervical five-six; REMOVAL OLD PLATE;  Surgeon: Mavis Purchase, MD;  Location: [facility] OR;  Service: Neurosurgery;  Laterality: N/A;   APPENDECTOMY  1992   BACK SURGERY     cervical   BREAST BIOPSY Left 06/23/2023   US  LT BREAST BX W LOC DEV 1ST LESION IMG BX SPEC US  GUIDE 06/23/2023 [facility] MAMMOGRAPHY   BREAST BIOPSY Left 07/28/2023   US  LT RADIOACTIVE SEED LOC 07/28/2023 [facility] MAMMOGRAPHY   BREAST BIOPSY Left 12/09/2024   US  LT BREAST BX W LOC DEV 1ST LESION IMG BX SPEC US  GUIDE 12/09/2024 [facility] MAMMOGRAPHY   BREAST LUMPECTOMY WITH RADIOACTIVE SEED AND SENTINEL LYMPH NODE BIOPSY Left 07/29/2023   Procedure: LEFT BREAST SEED LUMPECTOMY WITH LEFT SENTINEL LYMPH NODE MAPPING;  Surgeon: Vanderbilt Ned, MD;  Location: [facility] OR;  Service: General;  Laterality: Left;  PEC BLOCK   CATARACT EXTRACTION Bilateral    2021, 2022   COLONOSCOPY  2023   PLEURAL SCARIFICATION      ruptured disk     torn tendon     right arm   TUBAL LIGATION      SOCIAL HISTORY: Social History   Socioeconomic History   Marital status: Married    Spouse name: Not on file   Number of children: 2   Years of education: Not on file   Highest education level: Not on file  Occupational History   Occupation: locksmith  Tobacco Use   Smoking status: Former    Current packs/day: 0.00    Average packs/day: 1.5 packs/day for 40.1 years (60.2 ttl pk-yrs)    Types: Cigarettes    Start date: 12/31/1971    Quit date: 02/10/2012    Years since quitting: 12.8   Smokeless tobacco: Never  Vaping Use   Vaping status: Never Used  Substance and Sexual Activity   Alcohol use: No   Drug use: No   Sexual activity: Never  Other Topics Concern   Not on file  Social History Narrative   Not on file   Social Drivers of Health   Tobacco Use: Low Risk (09/07/2024)   Received from [facility]   Patient History    Smoking Tobacco Use: Never    Smokeless Tobacco Use: Never    Passive Exposure: Not on file  Recent Concern: Tobacco  Use - Medium Risk (07/13/2024)   Patient History    Smoking Tobacco Use: Former    Smokeless Tobacco Use: Never    Passive Exposure: Not on file  Financial Resource Strain: Not on file  Food Insecurity: No Food Insecurity (09/15/2023)   Hunger Vital Sign    Worried About Running Out of Food in the Last Year: Never true    Ran Out of Food in the Last Year: Never true  Transportation Needs: No Transportation Needs (09/15/2023)   PRAPARE - Administrator, Civil Service (Medical): No    Lack of Transportation (Non-Medical): No  Physical Activity: Not on file  Stress: Not on file  Social Connections: Unknown (05/12/2022)   Received from [facility]   Social Network    Social Network: Not on file  Intimate Partner Violence: Not At Risk (09/15/2023)   Humiliation, Afraid, Rape, and Kick questionnaire    Fear of Current or Ex-Partner: No    Emotionally  Abused: No    Physically Abused: No    Sexually Abused: No  Depression (PHQ2-9): Low Risk (09/15/2023)   Depression (PHQ2-9)    PHQ-2 Score: 0  Alcohol Screen: Not on file  Housing: Unknown (01/20/2024)   Received from [facility]   Epic    Unable to Pay for Housing in the Last Year: Not on file    Number of Times Moved in the Last Year: Not on file    At any time in the past 12 months, were you homeless or living in a shelter (including now)?: No  Utilities: Not At Risk (09/15/2023)   [facility] Utilities    Threatened with loss of utilities: No  Health Literacy: Not on file    FAMILY HISTORY: Family History  Problem Relation Age of Onset   Heart failure Mother    Crohn's disease Sister    Osteoporosis Sister    Neuropathy Sister    Colon polyps Sister    Lung cancer Sister 58       smoked   Thyroid  disease Sister    Thyroid  cancer Maternal Grandmother    Colon cancer Neg Hx    Breast cancer Neg Hx    Esophageal cancer Neg Hx    Pancreatic cancer Neg Hx    Stomach cancer Neg Hx     ALLERGIES:  is allergic to bupropion, cefaclor, elemental sulfur, sulfa antibiotics, latex, and ofloxacin.  MEDICATIONS:     PHYSICAL EXAMINATION: ECOG PERFORMANCE STATUS: 0 - Asymptomatic  There were no vitals taken for this visit.   Telephone visit  LABORATORY DATA:  I have reviewed the data as listed Lab Results  Component Value Date   WBC 6.3 12/29/2023   HGB 13.6 12/29/2023   HCT 42.0 12/29/2023   MCV 92.7 12/29/2023   PLT 265 12/29/2023   Lab Results  Component Value Date   NA 141 12/29/2023   K 4.6 12/29/2023   CL 105 12/29/2023   CO2 29 12/29/2023    RADIOGRAPHIC STUDIES: I have personally reviewed the radiological reports and agreed with the findings in the report.  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  Today is a telephone visit, to review mammogram and US  results Pathology neg for malignancy, benign changes, discussed,. Regarding pain in the chest wall and pain associated with breathing, I strongly encouraged her to go  to the ED for evaluation of PE, I told her PE needs to be treated quickly and can be life threatening. She says she will think about it, she says she is not worried this is a PE I offered pain management referral for post mastectomy pain, she declined it She requested PT referral ,placed.  Time spent: 20 min  I connected with  Madeline A Streater on 12/16/2024 by a telephone application and verified that I am speaking with the correct person using two identifiers.   I discussed the limitations of evaluation and management by telemedicine. The patient expressed understanding and agreed to proceed.  Location of pt: Home Location of provider: office.   Amber Stalls, MD 12/16/2024

## 2024-12-27 ENCOUNTER — Other Ambulatory Visit: Payer: Self-pay | Admitting: *Deleted

## 2024-12-27 DIAGNOSIS — Z171 Estrogen receptor negative status [ER-]: Secondary | ICD-10-CM

## 2025-01-02 NOTE — Therapy (Unsigned)
 " OUTPATIENT PHYSICAL THERAPY  UPPER EXTREMITY ONCOLOGY EVALUATION  Patient Name: [name] [name] MRN: [id number] DOB:10-05-1951, 74 y.o., female Today's Date: 01/03/2025  END OF SESSION:  PT End of Session - 01/03/25 2012     Visit Number 1    Number of Visits 1    PT Start Time 1500    PT Stop Time 1530    PT Time Calculation (min) 30 min    Activity Tolerance Patient tolerated treatment well    Behavior During Therapy [facility] for tasks assessed/performed          PT End of Session - 01/03/25 2012     Visit Number 1    Number of Visits 1    PT Start Time 1500    PT Stop Time 1530    PT Time Calculation (min) 30 min    Activity Tolerance Patient tolerated treatment well    Behavior During Therapy WFL for tasks assessed/performed           Past Medical History:  Diagnosis Date   Adenomatous colon polyp    Allergy    Anxiety    Arthritis    bilateral hands. lower back   Cancer [facility])    skin   Cataract    bilateral   COPD (chronic obstructive pulmonary disease) (HCC)    Depression    Diverticulosis    Endometriosis    GERD (gastroesophageal reflux disease)    Glaucoma    Headache    Migraines   Hiatal hernia    History of left breast cancer 05/2023   History of radiation therapy    Left breast- 09/25/23-10/17/23- Dr. Lynwood Nasuti   IBS (irritable bowel syndrome)    Inguinal hernia    Personal history of radiation therapy    Pneumothorax 1987   PONV (postoperative nausea and vomiting)    Past Surgical History:  Procedure Laterality Date   ABDOMINAL HYSTERECTOMY  1992   ANTERIOR CERVICAL DECOMP/DISCECTOMY FUSION N/A 09/23/2022   Procedure: Anterior Decompression Fusion,PLATE/SCREWS Cervical five-six; REMOVAL OLD PLATE;  Surgeon: Mavis Purchase, MD;  Location: [facility] OR;  Service: Neurosurgery;  Laterality: N/A;   APPENDECTOMY  1992   BACK SURGERY     cervical   BREAST BIOPSY Left 06/23/2023   US  LT BREAST BX W LOC DEV 1ST LESION IMG BX SPEC US  GUIDE  06/23/2023 [facility]   BREAST BIOPSY Left 07/28/2023   US  LT RADIOACTIVE SEED LOC 07/28/2023 [facility]   BREAST BIOPSY Left 12/09/2024   US  LT BREAST BX W LOC DEV 1ST LESION IMG BX SPEC US  GUIDE 12/09/2024 [facility]   BREAST LUMPECTOMY WITH RADIOACTIVE SEED AND SENTINEL LYMPH NODE BIOPSY Left 07/29/2023   Procedure: LEFT BREAST SEED LUMPECTOMY WITH LEFT SENTINEL LYMPH NODE MAPPING;  Surgeon: Vanderbilt Ned, MD;  Location: [facility] OR;  Service: General;  Laterality: Left;  PEC BLOCK   CATARACT EXTRACTION Bilateral    2021, 2022   COLONOSCOPY  2023   PLEURAL SCARIFICATION     ruptured disk     torn tendon     right arm   TUBAL LIGATION     Patient Active Problem List   Diagnosis Date Noted   Genetic testing 07/11/2023   Malignant neoplasm of upper-outer quadrant of left breast in female, estrogen receptor negative (HCC) 06/30/2023   Cervical spondylosis with radiculopathy 09/23/2022   Elevated low-density lipoprotein level 06/14/2022   Coronary arteriosclerosis 06/14/2022   Adverse reaction to antihyperlipidemic drug, initial encounter 06/14/2022  Diarrhea 01/27/2019   Change in bowel habits 01/05/2019   Left inguinal hernia 06/27/2014   Bloating 05/17/2013   Constipation 05/17/2013   Pneumothorax 10/17/2008   HIATAL HERNIA 10/17/2008   DIVERTICULOSIS, COLON 10/17/2008   ENDOMETRIOSIS 10/17/2008   Right lower quadrant abdominal pain 10/17/2008   DEPRESSION, HX OF 10/17/2008   History of colonic polyps 10/17/2008   PNEUMOTHORAX 10/17/2008    PCP: Dr Clarice   REFERRING PROVIDER: Amber Stalls, MD  REFERRING DIAG: Left Breast Cancer  THERAPY DIAG:  Lymphedema, not elsewhere classified  Malignant neoplasm of upper-outer quadrant of left breast in female, estrogen receptor negative (HCC)  Aftercare following surgery for neoplasm  ONSET DATE: 1 year ago  Rationale for Evaluation and Treatment: Rehabilitation  SUBJECTIVE:                                                                                                                                                                                            SUBJECTIVE STATEMENT: My armpit and my breast are bothering me the most.  I lost my husband in May and have been having to do a lot more.  I am back in the bra and back in the sleeve and I do have the glove.  I really just wanted to do the SOZO.   PERTINENT HISTORY:  Patient was diagnosed on 08/15/2023 with left grade 3 invasive ductal carcinoma breast cancer. She underwent a left lumpectomy and sentinel node biopsy (13 negative nodes) on 07/29/2023. It is triple negative with a Ki67 of 95%. She has a history of a cervical fusion in 08/2022. She ended radiation in October 2024. She had a breast seroma aspirated of 10 cc on 01/22/2024 . She has had prior PT treatment for cording and left UE/breast swelling  PAIN:  Are you having pain? Yes NPRS scale: 5/10 up to about an 8/10 Pain location: left lateral and inferior breast and axilla Pain orientation: Left and Lateral  PAIN TYPE: burning and rushing, pressure Aggravating factors: arm rubbing side, touching it,raising arm Relieving factors: propping her arm  PRECAUTIONS:  L UE lymphedema risk, cervical fusion C6, C7   RED FLAGS: Compression fracture: Yes: T12   WEIGHT BEARING RESTRICTIONS: No  FALLS:  Has patient fallen in last 6 months? X 1 tripping in the yard doing leaves     LIVING ENVIRONMENT: Lives: Alone  Lives in: House/apartment Stairs: Yes; External: 5 steps; can reach both Has following equipment at home: None  OCCUPATION: not working now after husband died.    LEISURE: going on walks    HAND DOMINANCE: right   PRIOR LEVEL OF FUNCTION: Independent  PATIENT GOALS: Get rid of pain and swelling   OBJECTIVE: Note: Objective measures were completed at Evaluation unless otherwise noted.  COGNITION: Overall cognitive status: Within functional limits for tasks  assessed   PALPATION: Tender lateral breast, inferior breast,axilla  OBSERVATIONS / OTHER ASSESSMENTS: generalized swelling,mild redness left breast. Nipple red and irritated looking, significant indentation on left from compression bra  SENSATION: Light touch: Deficits    POSTURE: forward head, rounded shoulders  UPPER EXTREMITY AROM/PROM:  A/PROM RIGHT   Prior Eval  01/03/25  Shoulder extension    Shoulder flexion 155,    Shoulder abduction 165,   Shoulder internal rotation    Shoulder external rotation      (Blank rows = not tested)  A/PROM LEFT   Prior Eval 01/03/25  Shoulder extension    Shoulder flexion 153   Shoulder abduction 162   Shoulder internal rotation    Shoulder external rotation      (Blank rows = not tested)  CERVICAL AROM: All within functional limits:   UPPER EXTREMITY STRENGTH:   LYMPHEDEMA ASSESSMENTS:  SURGERY TYPE/DATE: 07/29/23 L lumpectomy and SLNB  NUMBER OF LYMPH NODES REMOVED: 0+/13 CHEMOTHERAPY: no pt declined RADIATION:YES, completed Oct 2024 HORMONE TREATMENT: None INFECTIONS: none  LYMPHEDEMA ASSESSMENTS:   LANDMARK RIGHT  eval  At axilla  33.4  15 cm proximal to olecranon process 31.3  10 cm proximal to olecranon process 29.5  Olecranon process 26.4  15 cm proximal to ulnar styloid process 24.2  10 cm proximal to ulnar styloid process 21.0  Just proximal to ulnar styloid process 16.8  Across hand at thumb web space 19.8  At base of 2nd digit 6.4  (Blank rows = not tested)  LANDMARK LEFT  Prior visits 01/03/25  At axilla  33.2 33  15 cm proximal to olecranon process 32.2 31.6  10 cm proximal to olecranon process 30.5 30.8  Olecranon process 27.9 28  15  cm proximal to ulnar styloid process 24.5 23.6  10 cm proximal to ulnar styloid process 21.5 20.5  Just proximal to ulnar styloid process 18.65 19.3  Across hand at thumb web space 21.0 21  At base of 2nd digit 6.35 6.35  (Blank rows = not tested)  L-DEX LYMPHEDEMA  SCREENING:                                                                                                                          TREATMENT DATE:  SOZO and circumferential measurements taken Reviewed POC options and briefly MLD   PATIENT EDUCATION:  Education details:per today's note Person educated: Patient Education method: Explanation Education comprehension: verbalized understanding  HOME EXERCISE PROGRAM:   ASSESSMENT:  CLINICAL IMPRESSION: Patient is a 74 y.o. female who was seen today for physical therapy evaluation of her chronic arm and breast pain and swelling after treatment. She has a few sleeves and gloves to wear as well as compression bras.  She notes that she really hasn't worn any compression since  she was here 5-6 months ago.  Her husband recently passed away and she has been having to do more yard work and home related activities which most likely lead to increased edema and and elevated SOZO score.  She will return to wearing her sleeve, glove, and chest compression.  Declines pump.  Declines any further treatment but is agreeable t orechecking in around 4 weeks.   OBJECTIVE IMPAIRMENTS: decreased activity tolerance, decreased knowledge of condition, increased edema, impaired UE functional use, postural dysfunction, and pain.   ACTIVITY LIMITATIONS: sleeping, bed mobility, and reach over head  PARTICIPATION LIMITATIONS: anything requiring use of her arm to reach in certain directions putting pressure on breast  PERSONAL FACTORS: 1-2 comorbidities: Left triple negative breast cancer s/p radiation are also affecting patient's functional outcome.   REHAB POTENTIAL: Good  CLINICAL DECISION MAKING: Evolving/moderate complexity  EVALUATION COMPLEXITY: Moderate  GOALS: Goals reviewed with patient? Yes  SHORT TERM GOALS Target date: 02/03/25   1.  Pt will have a good understanding of activity modification, use of compression, and reasons for increased edema and  will resume SOZO Baseline:  Goal status: MET      PLAN:  PT FREQUENCY: 1x visit  PT DURATION:  PLANNED INTERVENTIONS: 97164- PT Re-evaluation, 97110-Therapeutic exercises, 97530- Therapeutic activity, [id number]- Neuromuscular re-education, 97535- Self Care, [id number]- Manual therapy, and [phone]- Orthotic Fit/training  PLAN FOR NEXT SESSION: CATHAY Larue Saddie JONELLE, PT 01/03/2025, 8:17 PM  "

## 2025-01-03 ENCOUNTER — Other Ambulatory Visit: Payer: Self-pay

## 2025-01-03 ENCOUNTER — Encounter: Payer: Self-pay | Admitting: Rehabilitation

## 2025-01-03 ENCOUNTER — Ambulatory Visit: Attending: Hematology and Oncology | Admitting: Rehabilitation

## 2025-01-03 DIAGNOSIS — I890 Lymphedema, not elsewhere classified: Secondary | ICD-10-CM | POA: Diagnosis present

## 2025-01-03 DIAGNOSIS — Z171 Estrogen receptor negative status [ER-]: Secondary | ICD-10-CM | POA: Diagnosis present

## 2025-01-03 DIAGNOSIS — C50412 Malignant neoplasm of upper-outer quadrant of left female breast: Secondary | ICD-10-CM | POA: Insufficient documentation

## 2025-01-03 DIAGNOSIS — Z483 Aftercare following surgery for neoplasm: Secondary | ICD-10-CM | POA: Insufficient documentation

## 2025-01-11 ENCOUNTER — Ambulatory Visit: Admitting: Pulmonary Disease

## 2025-01-11 ENCOUNTER — Encounter: Payer: Self-pay | Admitting: Pulmonary Disease

## 2025-01-11 VITALS — BP 132/83 | HR 59 | Wt 206.4 lb

## 2025-01-11 DIAGNOSIS — J4489 Other specified chronic obstructive pulmonary disease: Secondary | ICD-10-CM

## 2025-01-11 DIAGNOSIS — J301 Allergic rhinitis due to pollen: Secondary | ICD-10-CM | POA: Diagnosis not present

## 2025-01-11 DIAGNOSIS — R918 Other nonspecific abnormal finding of lung field: Secondary | ICD-10-CM | POA: Diagnosis not present

## 2025-01-11 DIAGNOSIS — R0609 Other forms of dyspnea: Secondary | ICD-10-CM

## 2025-01-11 DIAGNOSIS — R0982 Postnasal drip: Secondary | ICD-10-CM

## 2025-01-11 DIAGNOSIS — J449 Chronic obstructive pulmonary disease, unspecified: Secondary | ICD-10-CM | POA: Diagnosis not present

## 2025-01-11 MED ORDER — IPRATROPIUM BROMIDE 0.03 % NA SOLN: 2.0000 | Freq: Two times a day (BID) | NASAL | 12 refills | Status: AC

## 2025-01-11 MED ORDER — ALBUTEROL SULFATE HFA 108 (90 BASE) MCG/ACT IN AERS: 2.0000 | INHALATION_SPRAY | Freq: Four times a day (QID) | RESPIRATORY_TRACT | 5 refills | Status: AC | PRN

## 2025-01-11 NOTE — Patient Instructions (Addendum)
 Try Xyzal or Allegra in place of the Zyrtec  for your allergies  Continue albuterol  inhaler 1-2 puffs every 4-6 hours as needed - try using before walking to your mailbox to measure if is helping your breathing  Start ipratropium nasal spray, 2 sprays per nostril twice daily  We will determine when to do the next CT Chest scan between Dr. Loretha and myself  Follow up in 6 months, call sooner if needed

## 2025-01-11 NOTE — Progress Notes (Signed)
 "  Established Patient Pulmonology Office Visit   Subjective:  Patient ID: [name] [name], female    DOB: 12/11/1951  MRN: [id number]  CC:  Chief Complaint  Patient presents with   Medical Management of Chronic Issues    TOC- pt states pain if she cough sneeze or take deep breath on LT side     Discussed the use of AI scribe software for clinical note transcription with the patient, who gave verbal consent to proceed.  History of Present Illness [name] [name] is a 74 year old female with COPD and seasonal allergic rhinitis who presents for follow-up. Previously followed by Dr. Meade.  She reports significant nasal congestion and postnasal drainage that worsen when bending over, with mucus running down her throat. Chewing gum helps throat mucus somewhat but not nasal congestion. She has not tried ipratropium nasal spray and did not start using Flonase  after last visit.  She uses albuterol  inhaler as needed but has not needed it recently. She has exertional dyspnea with walking to the mailbox or climbing stairs that improves with rest. She was previously on Anoro Ellipta  but does not recall it well. Her pharmacy told her she will no longer cover albuterol , and she is unsure of the age of her current inhaler.  She has been undergoing CT chest scans for follow up of her history of breast cancer. She was also enrolled in the lung cancer screening program in the past.        ROS   Current Medications[1]      Objective:  BP 132/83   Pulse (!) 59   Wt 206 lb 6.4 oz (93.6 kg)   SpO2 95%   BMI 30.04 kg/m     Physical Exam Constitutional:      General: She is not in acute distress.    Appearance: Normal appearance.  Eyes:     General: No scleral icterus.    Conjunctiva/sclera: Conjunctivae normal.  Cardiovascular:     Rate and Rhythm: Normal rate and regular rhythm.  Pulmonary:     Breath sounds: No wheezing, rhonchi or rales.  Musculoskeletal:     Right lower leg: No  edema.     Left lower leg: No edema.  Skin:    General: Skin is warm and dry.  Neurological:     General: No focal deficit present.      Diagnostic Review:  Last CBC Lab Results  Component Value Date   WBC 6.3 12/29/2023   HGB 13.6 12/29/2023   HCT 42.0 12/29/2023   MCV 92.7 12/29/2023   MCH 30.0 12/29/2023   RDW 14.1 12/29/2023   PLT 265 12/29/2023   Last metabolic panel Lab Results  Component Value Date   GLUCOSE 98 12/29/2023   NA 141 12/29/2023   K 4.6 12/29/2023   CL 105 12/29/2023   CO2 29 12/29/2023   BUN 20 12/29/2023   CREATININE 1.26 (H) 12/29/2023   GFRNONAA 45 (L) 12/29/2023   CALCIUM 9.5 12/29/2023   PROT 7.1 12/29/2023   ALBUMIN 3.9 12/29/2023   BILITOT 0.4 12/29/2023   ALKPHOS 66 12/29/2023   AST 13 (L) 12/29/2023   ALT 9 12/29/2023   ANIONGAP 7 12/29/2023       Assessment & Plan:   Assessment & Plan COPD with chronic bronchitis and emphysema (HCC)  Orders:   albuterol  (VENTOLIN  HFA) 108 (90 Base) MCG/ACT inhaler; Inhale 2 puffs into the lungs every 6 (six) hours as needed.  Post-nasal drainage  Orders:  ipratropium (ATROVENT ) 0.03 % nasal spray; Place 2 sprays into both nostrils every 12 (twelve) hours.  Pulmonary nodules     Seasonal allergic rhinitis due to pollen      Assessment and Plan Assessment & Plan Chronic obstructive pulmonary disease COPD with exertional dyspnea. Discussed potential use of albuterol  before exertion. Considered maintenance inhaler if frequent albuterol  use. - Sent updated prescription for albuterol  to [facility]. - Consider using albuterol  before exertion to assess effectiveness. - Will consider maintenance inhaler if albuterol  is used frequently.  Seasonal allergic rhinitis with postnasal drip Severe allergic rhinitis with postnasal drip. Discussed alternative nasal spray, ipratropium, to reduce mucus. Considered alternative antihistamines if current medication ineffective. Advised monitoring  for nasal dryness or irritation. - Prescribed ipratropium (Atrovent ) nasal spray, two sprays per nostril twice daily. - Consider alternative antihistamines such as Xyzal or Allegra if current medication is ineffective. - Monitor for nasal dryness or irritation with nasal spray use.  Pulmonary nodules under surveillance - Will determine with oncology when to order next CT Chest      Return in about 6 months (around 07/11/2025) for f/u visit Dr. Kara.   Dorn KATHEE Kara, MD     [1]  Current Outpatient Medications:    acetaminophen  (TYLENOL ) 325 MG tablet, Take 325 mg by mouth every 6 (six) hours as needed., Disp: , Rfl:    ALPRAZolam (XANAX) 0.25 MG tablet, Take 0.25 mg by mouth 3 (three) times daily as needed for anxiety., Disp: , Rfl:    aspirin  EC 81 MG tablet, Take 81 mg by mouth in the morning. Swallow whole., Disp: , Rfl:    cetirizine  (ZYRTEC ) 10 MG tablet, Take 1 tablet (10 mg total) by mouth daily., Disp: 30 tablet, Rfl: 11   Cholecalciferol (VITAMIN D  PO), Take 5,000 Units by mouth in the morning., Disp: , Rfl:    denosumab  (PROLIA ) 60 MG/ML SOSY injection, Inject 60 mg into the skin every 6 (six) months., Disp: , Rfl:    famotidine  (PEPCID ) 20 MG tablet, TAKE 1 TABLET BY MOUTH TWICE A DAY AS NEEDED, Disp: 180 tablet, Rfl: 2   fluticasone  (FLONASE ) 50 MCG/ACT nasal spray, Place 1 spray into both nostrils daily., Disp: 16 g, Rfl: 11   Ibuprofen (ADVIL) 200 MG CAPS, Take 1 capsule by mouth as needed., Disp: , Rfl:    inclisiran (LEQVIO ) 284 MG/1.5ML SOSY injection, Inject 284 mg into the skin every 6 (six) months. Every three months, Disp: , Rfl:    ipratropium (ATROVENT ) 0.03 % nasal spray, Place 2 sprays into both nostrils every 12 (twelve) hours., Disp: 30 mL, Rfl: 12   loperamide (IMODIUM) 2 MG capsule, Take 2 mg by mouth as needed for diarrhea or loose stools., Disp: , Rfl:    promethazine  (PHENERGAN ) 12.5 MG suppository, Place 12.5 mg rectally every 6 (six) hours as  needed for nausea or vomiting., Disp: , Rfl:    sertraline (ZOLOFT) 100 MG tablet, Take 50 mg by mouth in the morning., Disp: , Rfl:    traZODone (DESYREL) 100 MG tablet, Take 100 mg by mouth at bedtime., Disp: , Rfl:    albuterol  (VENTOLIN  HFA) 108 (90 Base) MCG/ACT inhaler, Inhale 2 puffs into the lungs every 6 (six) hours as needed., Disp: 18 g, Rfl: 5   Eyelid Cleansers ([employer] EYELID CLEANS[employer] EX), , Disp: , Rfl:    XDEMVY 0.25 % SOLN, Place 1 drop into both eyes in the morning and at bedtime., Disp: , Rfl:   "

## 2025-01-12 ENCOUNTER — Telehealth: Payer: Self-pay | Admitting: Acute Care

## 2025-01-12 NOTE — Telephone Encounter (Signed)
 Received VM from pt confused why she received a [facility] email. Called pt, she was seen by Dr. Kara on 01/11/2025 and it was advised by Dr. Kara that pt needs a CT chest but it would be done by Dr. Kara or Dr. Loretha. I informed pt of this information, she is aware. Will follow up to assure pt gets her CT chest.

## 2025-01-18 ENCOUNTER — Telehealth: Payer: Self-pay

## 2025-01-18 NOTE — Telephone Encounter (Signed)
 Spoke with patient [name] [name], see you at next OV

## 2025-01-18 NOTE — Telephone Encounter (Signed)
-----   Message from Dorn Chill, MD sent at 01/18/2025  7:27 AM EST ----- Regarding: FW: CT Scans Please let patient know that I have spoken with oncology and I will be ordering/scheduling the CT Chest follow up in April of this year to monitor her lung nodules. Order has been placed.  Thanks, JD ----- Message ----- From: Loretha Ash, MD Sent: 01/13/2025   4:52 PM EST To: Dorn KATHEE Chill, MD Subject: RE: CT Scans                                   I will let you take over this if you dont mind.  Thanks, ----- Message ----- From: Chill Dorn KATHEE, MD Sent: 01/11/2025   5:04 PM EST To: Ash Loretha, MD Subject: RE: CT Scans                                   It looks like she ordered the scan on 03/09/24 for June or July of last year.   Are you planning to order CT Chest scans as part of any surveillance plans? Otherwise I will place new order.  Thanks, Thom ----- Message ----- From: Loretha Ash, MD Sent: 01/11/2025   5:01 PM EST To: Dorn KATHEE Chill, MD Subject: RE: CT Scans                                   Hi Dr Chill  I think there is an order for CT by Dr Verdon Gore.  Thanks, ----- Message ----- From: Chill Dorn KATHEE, MD Sent: 01/11/2025  11:22 AM EST To: Ash Loretha, MD Subject: CT Scans                                       Hi Praveena,  I am taking over this patient's care. I wanted to clarify if you are planning to have an updated CT Chest scan this year for monitoring, if not I can get her re-enrolled in the Lung Cancer Screening program.  Thanks, Thom

## 2025-01-18 NOTE — Addendum Note (Signed)
 Addended by: KARA SIMMONDS on: 01/18/2025 07:27 AM   Modules accepted: Orders

## 2025-01-26 NOTE — Telephone Encounter (Signed)
 Patient has been scheduled for CT Chest wo in April 2026 with Dr. Kara.

## 2025-01-31 ENCOUNTER — Ambulatory Visit

## 2025-02-01 ENCOUNTER — Encounter: Payer: Self-pay | Admitting: Nurse Practitioner

## 2025-02-01 ENCOUNTER — Inpatient Hospital Stay: Attending: Hematology and Oncology | Admitting: Nurse Practitioner

## 2025-02-01 VITALS — BP 126/69 | HR 66 | Temp 97.6°F | Resp 17 | Ht 69.5 in | Wt 207.0 lb

## 2025-02-01 DIAGNOSIS — Z515 Encounter for palliative care: Secondary | ICD-10-CM

## 2025-02-01 DIAGNOSIS — M792 Neuralgia and neuritis, unspecified: Secondary | ICD-10-CM

## 2025-02-01 DIAGNOSIS — I890 Lymphedema, not elsewhere classified: Secondary | ICD-10-CM | POA: Diagnosis not present

## 2025-02-01 DIAGNOSIS — Z171 Estrogen receptor negative status [ER-]: Secondary | ICD-10-CM | POA: Diagnosis not present

## 2025-02-01 DIAGNOSIS — G893 Neoplasm related pain (acute) (chronic): Secondary | ICD-10-CM | POA: Diagnosis not present

## 2025-02-01 DIAGNOSIS — R530 Neoplastic (malignant) related fatigue: Secondary | ICD-10-CM

## 2025-02-01 DIAGNOSIS — C50412 Malignant neoplasm of upper-outer quadrant of left female breast: Secondary | ICD-10-CM

## 2025-02-01 MED ORDER — GABAPENTIN 100 MG PO CAPS: 100.0000 mg | ORAL_CAPSULE | Freq: Two times a day (BID) | ORAL | 0 refills | Status: AC

## 2025-02-09 ENCOUNTER — Inpatient Hospital Stay

## 2025-02-14 ENCOUNTER — Ambulatory Visit

## 2025-04-01 ENCOUNTER — Other Ambulatory Visit

## 2025-04-28 ENCOUNTER — Ambulatory Visit

## 2025-05-24 ENCOUNTER — Inpatient Hospital Stay: Attending: Hematology and Oncology | Admitting: Hematology and Oncology
